# Patient Record
Sex: Male | Born: 1965
Health system: Southern US, Community
[De-identification: ages and names within clinical notes are randomized; demographics above are authoritative.]

## PROBLEM LIST (undated history)

## (undated) DIAGNOSIS — M109 Gout, unspecified: Secondary | ICD-10-CM

## (undated) DIAGNOSIS — D170 Benign lipomatous neoplasm of skin and subcutaneous tissue of head, face and neck: Secondary | ICD-10-CM

## (undated) DIAGNOSIS — K219 Gastro-esophageal reflux disease without esophagitis: Secondary | ICD-10-CM

## (undated) DIAGNOSIS — I10 Essential (primary) hypertension: Secondary | ICD-10-CM

## (undated) DIAGNOSIS — L309 Dermatitis, unspecified: Secondary | ICD-10-CM

## (undated) DIAGNOSIS — E785 Hyperlipidemia, unspecified: Secondary | ICD-10-CM

## (undated) DIAGNOSIS — E669 Obesity, unspecified: Secondary | ICD-10-CM

## (undated) HISTORY — DX: Obesity, unspecified: E66.9

## (undated) HISTORY — DX: Gastro-esophageal reflux disease without esophagitis: K21.9

## (undated) HISTORY — DX: Benign lipomatous neoplasm of skin and subcutaneous tissue of head, face and neck: D17.0

## (undated) HISTORY — DX: Essential (primary) hypertension: I10

## (undated) HISTORY — DX: Dermatitis, unspecified: L30.9

## (undated) HISTORY — PX: OTHER SURGICAL HISTORY: SHX169

## (undated) HISTORY — DX: Hyperlipidemia, unspecified: E78.5

---

## 1997-08-18 ENCOUNTER — Inpatient Hospital Stay (HOSPITAL_COMMUNITY): Admission: RE | Admit: 1997-08-18 | Discharge: 1997-08-20 | Payer: Self-pay | Admitting: Orthopedic Surgery

## 1999-06-20 ENCOUNTER — Emergency Department (HOSPITAL_COMMUNITY): Admission: EM | Admit: 1999-06-20 | Discharge: 1999-06-20 | Payer: Self-pay | Admitting: Emergency Medicine

## 2000-08-26 ENCOUNTER — Emergency Department (HOSPITAL_COMMUNITY): Admission: EM | Admit: 2000-08-26 | Discharge: 2000-08-26 | Payer: Self-pay | Admitting: Emergency Medicine

## 2001-09-09 ENCOUNTER — Encounter: Admission: RE | Admit: 2001-09-09 | Discharge: 2001-12-08 | Payer: Self-pay | Admitting: Family Medicine

## 2003-10-06 ENCOUNTER — Emergency Department (HOSPITAL_COMMUNITY): Admission: EM | Admit: 2003-10-06 | Discharge: 2003-10-06 | Payer: Self-pay | Admitting: Family Medicine

## 2003-12-27 ENCOUNTER — Ambulatory Visit: Payer: Self-pay | Admitting: Family Medicine

## 2003-12-28 ENCOUNTER — Ambulatory Visit: Payer: Self-pay | Admitting: *Deleted

## 2004-01-11 ENCOUNTER — Ambulatory Visit: Payer: Self-pay | Admitting: Family Medicine

## 2004-02-20 ENCOUNTER — Ambulatory Visit: Payer: Self-pay | Admitting: Family Medicine

## 2004-02-22 ENCOUNTER — Ambulatory Visit: Payer: Self-pay | Admitting: Family Medicine

## 2004-03-26 ENCOUNTER — Ambulatory Visit: Payer: Self-pay | Admitting: Family Medicine

## 2004-05-15 ENCOUNTER — Ambulatory Visit: Payer: Self-pay | Admitting: Family Medicine

## 2004-05-18 ENCOUNTER — Ambulatory Visit: Payer: Self-pay | Admitting: Family Medicine

## 2004-07-26 ENCOUNTER — Ambulatory Visit: Payer: Self-pay | Admitting: Family Medicine

## 2004-10-22 ENCOUNTER — Ambulatory Visit: Payer: Self-pay | Admitting: Internal Medicine

## 2004-11-05 ENCOUNTER — Emergency Department (HOSPITAL_COMMUNITY): Admission: EM | Admit: 2004-11-05 | Discharge: 2004-11-05 | Payer: Self-pay | Admitting: Emergency Medicine

## 2004-11-05 ENCOUNTER — Ambulatory Visit: Payer: Self-pay | Admitting: Internal Medicine

## 2004-11-12 ENCOUNTER — Ambulatory Visit: Payer: Self-pay | Admitting: Internal Medicine

## 2004-11-28 ENCOUNTER — Ambulatory Visit: Payer: Self-pay | Admitting: Internal Medicine

## 2007-08-09 ENCOUNTER — Emergency Department (HOSPITAL_COMMUNITY): Admission: EM | Admit: 2007-08-09 | Discharge: 2007-08-10 | Payer: Self-pay | Admitting: Emergency Medicine

## 2010-08-11 ENCOUNTER — Inpatient Hospital Stay (HOSPITAL_COMMUNITY)
Admission: EM | Admit: 2010-08-11 | Discharge: 2010-08-21 | DRG: 729 | Disposition: A | Discharge status: Home Health | Payer: Self-pay | Attending: Urology | Admitting: Urology

## 2010-08-11 ENCOUNTER — Emergency Department (HOSPITAL_COMMUNITY): Payer: Self-pay

## 2010-08-11 DIAGNOSIS — E785 Hyperlipidemia, unspecified: Secondary | ICD-10-CM | POA: Diagnosis present

## 2010-08-11 DIAGNOSIS — R509 Fever, unspecified: Secondary | ICD-10-CM | POA: Diagnosis present

## 2010-08-11 DIAGNOSIS — E871 Hypo-osmolality and hyponatremia: Secondary | ICD-10-CM | POA: Diagnosis not present

## 2010-08-11 DIAGNOSIS — N501 Vascular disorders of male genital organs: Principal | ICD-10-CM | POA: Diagnosis present

## 2010-08-11 DIAGNOSIS — I10 Essential (primary) hypertension: Secondary | ICD-10-CM | POA: Diagnosis present

## 2010-08-11 DIAGNOSIS — R3129 Other microscopic hematuria: Secondary | ICD-10-CM | POA: Diagnosis not present

## 2010-08-11 DIAGNOSIS — R300 Dysuria: Secondary | ICD-10-CM | POA: Diagnosis not present

## 2010-08-11 DIAGNOSIS — K5900 Constipation, unspecified: Secondary | ICD-10-CM | POA: Diagnosis not present

## 2010-08-11 DIAGNOSIS — N179 Acute kidney failure, unspecified: Secondary | ICD-10-CM | POA: Diagnosis present

## 2010-08-11 DIAGNOSIS — Z794 Long term (current) use of insulin: Secondary | ICD-10-CM

## 2010-08-11 DIAGNOSIS — IMO0001 Reserved for inherently not codable concepts without codable children: Secondary | ICD-10-CM | POA: Diagnosis present

## 2010-08-11 DIAGNOSIS — E876 Hypokalemia: Secondary | ICD-10-CM | POA: Diagnosis present

## 2010-08-11 LAB — CBC
HCT: 43.4 % (ref 39.0–52.0)
Hemoglobin: 15.9 g/dL (ref 13.0–17.0)
MCH: 30.3 pg (ref 26.0–34.0)
MCHC: 36.6 g/dL — ABNORMAL HIGH (ref 30.0–36.0)
MCV: 82.8 fL (ref 78.0–100.0)
Platelets: 179 K/uL (ref 150–400)
RBC: 5.24 MIL/uL (ref 4.22–5.81)
RDW: 12.4 % (ref 11.5–15.5)
WBC: 16.1 10*3/uL — ABNORMAL HIGH (ref 4.0–10.5)

## 2010-08-11 LAB — DIFFERENTIAL
Basophils Absolute: 0 10*3/uL (ref 0.0–0.1)
Basophils Relative: 0 % (ref 0–1)
Eosinophils Absolute: 0 K/uL (ref 0.0–0.7)
Eosinophils Relative: 0 % (ref 0–5)
Lymphocytes Relative: 8 % — ABNORMAL LOW (ref 12–46)
Lymphs Abs: 1.3 10*3/uL (ref 0.7–4.0)
Monocytes Absolute: 1.6 10*3/uL — ABNORMAL HIGH (ref 0.1–1.0)
Monocytes Relative: 10 % (ref 3–12)
Neutro Abs: 13.2 K/uL — ABNORMAL HIGH (ref 1.7–7.7)
Neutrophils Relative %: 82 % — ABNORMAL HIGH (ref 43–77)

## 2010-08-11 LAB — BASIC METABOLIC PANEL WITH GFR
CO2: 25 meq/L (ref 19–32)
Calcium: 8.8 mg/dL (ref 8.4–10.5)
Creatinine, Ser: 2.02 mg/dL — ABNORMAL HIGH (ref 0.4–1.5)
GFR calc Af Amer: 43 mL/min — ABNORMAL LOW (ref >60)

## 2010-08-11 LAB — PROCALCITONIN: Procalcitonin: 1.22 ng/mL

## 2010-08-11 LAB — GLUCOSE, CAPILLARY: Glucose-Capillary: 547 mg/dL — ABNORMAL HIGH (ref 70–99)

## 2010-08-11 LAB — BASIC METABOLIC PANEL
BUN: 16 mg/dL (ref 6–23)
Chloride: 87 mEq/L — ABNORMAL LOW (ref 96–112)
GFR calc non Af Amer: 36 mL/min — ABNORMAL LOW (ref >60)
Glucose, Bld: 520 mg/dL — ABNORMAL HIGH (ref 70–99)
Potassium: 3.3 mEq/L — ABNORMAL LOW (ref 3.5–5.1)
Sodium: 130 mEq/L — ABNORMAL LOW (ref 135–145)

## 2010-08-11 LAB — LACTIC ACID, PLASMA: Lactic Acid, Venous: 3.6 mmol/L — ABNORMAL HIGH (ref 0.5–2.2)

## 2010-08-12 LAB — GLUCOSE, CAPILLARY
Glucose-Capillary: 276 mg/dL — ABNORMAL HIGH (ref 70–99)
Glucose-Capillary: 278 mg/dL — ABNORMAL HIGH (ref 70–99)
Glucose-Capillary: 279 mg/dL — ABNORMAL HIGH (ref 70–99)
Glucose-Capillary: 401 mg/dL — ABNORMAL HIGH (ref 70–99)

## 2010-08-12 LAB — HEMOGLOBIN A1C
Hgb A1c MFr Bld: 12.6 % — ABNORMAL HIGH (ref <5.7)
Mean Plasma Glucose: 315 mg/dL — ABNORMAL HIGH (ref <117)

## 2010-08-12 LAB — BASIC METABOLIC PANEL
CO2: 27 mEq/L (ref 19–32)
Calcium: 7.8 mg/dL — ABNORMAL LOW (ref 8.4–10.5)
Creatinine, Ser: 1.4 mg/dL (ref 0.4–1.5)
GFR calc Af Amer: >60 mL/min (ref >60)
Glucose, Bld: 343 mg/dL — ABNORMAL HIGH (ref 70–99)
Sodium: 130 mEq/L — ABNORMAL LOW (ref 135–145)

## 2010-08-12 LAB — LIPID PANEL
HDL: 13 mg/dL — ABNORMAL LOW (ref >39)
LDL Cholesterol: 95 mg/dL (ref 0–99)
VLDL: 22 mg/dL (ref 0–40)

## 2010-08-12 LAB — CBC
HCT: 38.3 % — ABNORMAL LOW (ref 39.0–52.0)
MCH: 30 pg (ref 26.0–34.0)
Platelets: 155 10*3/uL (ref 150–400)
RBC: 4.6 MIL/uL (ref 4.22–5.81)
WBC: 14.5 10*3/uL — ABNORMAL HIGH (ref 4.0–10.5)

## 2010-08-12 LAB — SODIUM, URINE, RANDOM: Sodium, Ur: 56 mEq/L

## 2010-08-12 LAB — CREATININE, URINE, RANDOM: Creatinine, Urine: 201.3 mg/dL

## 2010-08-13 LAB — CBC
MCH: 29.1 pg (ref 26.0–34.0)
MCHC: 35 g/dL (ref 30.0–36.0)
MCV: 83.3 fL (ref 78.0–100.0)
Platelets: 161 10*3/uL (ref 150–400)
RBC: 4.12 MIL/uL — ABNORMAL LOW (ref 4.22–5.81)
RDW: 12.4 % (ref 11.5–15.5)

## 2010-08-13 LAB — GLUCOSE, CAPILLARY
Glucose-Capillary: 234 mg/dL — ABNORMAL HIGH (ref 70–99)
Glucose-Capillary: 185 mg/dL — ABNORMAL HIGH (ref 70–99)

## 2010-08-13 LAB — DIFFERENTIAL
Basophils Relative: 0 % (ref 0–1)
Eosinophils Absolute: 0 10*3/uL (ref 0.0–0.7)
Eosinophils Relative: 0 % (ref 0–5)
Lymphs Abs: 1.8 10*3/uL (ref 0.7–4.0)
Monocytes Absolute: 1.2 10*3/uL — ABNORMAL HIGH (ref 0.1–1.0)
Monocytes Relative: 12 % (ref 3–12)
Neutrophils Relative %: 70 % (ref 43–77)

## 2010-08-13 LAB — BASIC METABOLIC PANEL
BUN: 13 mg/dL (ref 6–23)
Chloride: 98 mEq/L (ref 96–112)
Creatinine, Ser: 1.07 mg/dL (ref 0.4–1.5)
Glucose, Bld: 236 mg/dL — ABNORMAL HIGH (ref 70–99)

## 2010-08-13 LAB — GENTAMICIN LEVEL, RANDOM: Gentamicin Rm: 4.4 ug/mL

## 2010-08-14 LAB — GLUCOSE, CAPILLARY
Glucose-Capillary: 163 mg/dL — ABNORMAL HIGH (ref 70–99)
Glucose-Capillary: 240 mg/dL — ABNORMAL HIGH (ref 70–99)

## 2010-08-14 LAB — CULTURE, ROUTINE-ABSCESS

## 2010-08-14 LAB — BASIC METABOLIC PANEL
BUN: 9 mg/dL (ref 6–23)
CO2: 27 mEq/L (ref 19–32)
Calcium: 7.8 mg/dL — ABNORMAL LOW (ref 8.4–10.5)
Chloride: 98 mEq/L (ref 96–112)
Creatinine, Ser: 1 mg/dL (ref 0.4–1.5)
GFR calc Af Amer: >60 mL/min (ref >60)
GFR calc non Af Amer: >60 mL/min (ref >60)
Glucose, Bld: 185 mg/dL — ABNORMAL HIGH (ref 70–99)
Potassium: 3 mEq/L — ABNORMAL LOW (ref 3.5–5.1)
Sodium: 135 mEq/L (ref 135–145)

## 2010-08-14 LAB — MRSA PCR SCREENING: MRSA by PCR: NEGATIVE

## 2010-08-14 LAB — MAGNESIUM: Magnesium: 1.4 mg/dL — ABNORMAL LOW (ref 1.5–2.5)

## 2010-08-15 LAB — DIFFERENTIAL
Basophils Absolute: 0.1 10*3/uL (ref 0.0–0.1)
Eosinophils Relative: 1 % (ref 0–5)
Lymphocytes Relative: 27 % (ref 12–46)
Lymphs Abs: 2.5 10*3/uL (ref 0.7–4.0)
Monocytes Relative: 12 % (ref 3–12)
Neutro Abs: 5.3 10*3/uL (ref 1.7–7.7)
Smear Review: ADEQUATE

## 2010-08-15 LAB — CBC
HCT: 34.9 % — ABNORMAL LOW (ref 39.0–52.0)
MCHC: 35.2 g/dL (ref 30.0–36.0)
RDW: 12.5 % (ref 11.5–15.5)
WBC: 9.1 10*3/uL (ref 4.0–10.5)

## 2010-08-15 LAB — URIC ACID: Uric Acid, Serum: 4.3 mg/dL (ref 4.0–7.8)

## 2010-08-15 LAB — GLUCOSE, CAPILLARY
Glucose-Capillary: 229 mg/dL — ABNORMAL HIGH (ref 70–99)
Glucose-Capillary: 196 mg/dL — ABNORMAL HIGH (ref 70–99)

## 2010-08-15 LAB — BASIC METABOLIC PANEL
Chloride: 96 mEq/L (ref 96–112)
GFR calc non Af Amer: >60 mL/min (ref >60)
Potassium: 3.2 mEq/L — ABNORMAL LOW (ref 3.5–5.1)
Sodium: 137 mEq/L (ref 135–145)

## 2010-08-15 LAB — MAGNESIUM: Magnesium: 1.9 mg/dL (ref 1.5–2.5)

## 2010-08-16 LAB — GLUCOSE, CAPILLARY
Glucose-Capillary: 247 mg/dL — ABNORMAL HIGH (ref 70–99)
Glucose-Capillary: 249 mg/dL — ABNORMAL HIGH (ref 70–99)

## 2010-08-16 LAB — BASIC METABOLIC PANEL
CO2: 30 mEq/L (ref 19–32)
Calcium: 8.7 mg/dL (ref 8.4–10.5)
Creatinine, Ser: 1 mg/dL (ref 0.4–1.5)
GFR calc Af Amer: >60 mL/min (ref >60)
GFR calc non Af Amer: >60 mL/min (ref >60)
Glucose, Bld: 208 mg/dL — ABNORMAL HIGH (ref 70–99)
Sodium: 133 mEq/L — ABNORMAL LOW (ref 135–145)

## 2010-08-16 LAB — ANAEROBIC CULTURE

## 2010-08-17 LAB — CBC
Hemoglobin: 12.9 g/dL — ABNORMAL LOW (ref 13.0–17.0)
MCH: 29 pg (ref 26.0–34.0)
MCHC: 35 g/dL (ref 30.0–36.0)
MCV: 82.9 fL (ref 78.0–100.0)
RBC: 4.53 MIL/uL (ref 4.22–5.81)
RDW: 12.6 % (ref 11.5–15.5)
WBC: 9 10*3/uL (ref 4.0–10.5)

## 2010-08-17 LAB — GLUCOSE, CAPILLARY
Glucose-Capillary: 174 mg/dL — ABNORMAL HIGH (ref 70–99)
Glucose-Capillary: 212 mg/dL — ABNORMAL HIGH (ref 70–99)
Glucose-Capillary: 233 mg/dL — ABNORMAL HIGH (ref 70–99)

## 2010-08-17 LAB — CULTURE, BLOOD (ROUTINE X 2)
Culture  Setup Time: 201204281750
Culture  Setup Time: 201204281750
Culture: NO GROWTH
Culture: NO GROWTH

## 2010-08-17 LAB — DIFFERENTIAL
Basophils Absolute: 0.1 10*3/uL (ref 0.0–0.1)
Basophils Relative: 1 % (ref 0–1)
Eosinophils Absolute: 0.1 10*3/uL (ref 0.0–0.7)
Lymphocytes Relative: 31 % (ref 12–46)
Monocytes Absolute: 0.9 10*3/uL (ref 0.1–1.0)
Neutrophils Relative %: 57 % (ref 43–77)
Smear Review: ADEQUATE

## 2010-08-17 LAB — BASIC METABOLIC PANEL
BUN: 13 mg/dL (ref 6–23)
Chloride: 94 mEq/L — ABNORMAL LOW (ref 96–112)
GFR calc Af Amer: >60 mL/min (ref >60)
GFR calc non Af Amer: >60 mL/min (ref >60)
Potassium: 3.6 mEq/L (ref 3.5–5.1)
Sodium: 135 mEq/L (ref 135–145)

## 2010-08-17 LAB — MAGNESIUM: Magnesium: 1.7 mg/dL (ref 1.5–2.5)

## 2010-08-17 NOTE — Op Note (Signed)
  [name], Justin Houston              ACCOUNT NO.:  [id number]  MEDICAL RECORD NO.:  [id number]           PATIENT TYPE:  I  LOCATION:  1533                         FACILITY:  [facility]  PHYSICIAN:  Danae Chen, M.D.  DATE OF BIRTH:  02-Sep-1965  DATE OF PROCEDURE: DATE OF DISCHARGE:                              OPERATIVE REPORT   PREOPERATIVE DIAGNOSIS:  Fournier's gangrene.  POSTOP DIAGNOSIS:  Fournier's gangrene.  PROCEDURE:  Incision and drainage and wide debridement of Fournier's gangrene.  SURGEON:  Danae Chen, M.D.  ANESTHESIA:  General.  DATE OF PROCEDURE:  August 11, 2010.  INDICATION:  The patient is a 45 year old male who started having chills, fever, swelling of the scrotum and pain since Wednesday. The symptoms have progressively worsened and he went to the urgent care this morning and was referred to [facility] for further evaluation and treatment.  On examination, he was found to have a markedly swollen and reddened andtender left scrotum.  CT scan showed gas formation in the left scrotum with advanced inflammatory changes. The patient was found to have Fournier's gangrene and he is scheduled for incision, drainage, and debridement of Fournier's gangrene.  The patient is a known diabetic since 1997, and he is on Lantus insulin 92 units daily nightly. His hemoglobin is 15.9, hematocrit 43.4, and WBC 16.1 with 82% neutrophils.  His blood glucose is 520, BUN 16, creatinine 2.02.  He is tachycardic with a pulse rate of 118.  DESCRIPTION OF PROCEDURE:  The patient was identified by his wristband and proper time-out was taken.    Under general anesthesia, he was prepped and draped and placed in the dorsal lithotomy position.  An incision was made on the left scrotum and a large amount of foul-smelling purulent drainage was drained out of the incision.  The inflammatory changes extended up to the upper scrotum.  The incision was extended to the length of the scrotum  and widened and aerobes and anaerobes culture were obtained.  Then wide debridement of the scrotum was done.  The debridement was done down to pink tissues.  Hemostasis was then secured with electrocautery.  There was another collection of fluid in the anterior scrotum and another incision was made over that area and a moderate amount of fluid was then drained out of that incision.  The testicle was exposed, but the testicle appears normal. Then pulse lavage of the incision was done. Then Kerlix soaked in normal saline was packed in the wounds.  A #16 Foley catheter was inserted in the bladder and drained clear urine.  The patient tolerated the procedure well and left the OR in satisfactory condition to post anesthesia care unit.     Danae Chen, M.D.     MN/MEDQ  D:  08/11/2010  T:  08/11/2010  Job:  [id number]  Electronically Signed by Lindaann Slough M.D. on 08/17/2010 03:26:41 PM

## 2010-08-17 NOTE — Consult Note (Signed)
Justin Houston, Justin Houston              ACCOUNT NO.:  [id number]  MEDICAL RECORD NO.:  [id number]           PATIENT TYPE:  I  LOCATION:  1533                         FACILITY:  [facility]  PHYSICIAN:  Danae Chen, M.D.  DATE OF BIRTH:  [birth year]  DATE OF CONSULTATION: DATE OF DISCHARGE:                                CONSULTATION   REASON FOR CONSULTATION:  Swelling and pain of scrotum.  The patient is a 45 years old male, known diabetic, who presents to the emergency room today with complaints of severe left scrotal pain.  The pain started on Wednesday and was associated with chills, fever.  The swelling gradually worsened as well as the pain.  He went to the Urgent Care this morning and he was referred to the emergency room for further evaluation. On physical examination, he   was found to have marked swelling of the scrotum, and the  scrotum is exquisitely tender, more on the left.  It is more prevalent on the left side than the right side.  He does not have any voiding symptoms. A CT scan of the pelvis showed gas formation in the scrotum with marked inflammatory reaction.  PAST MEDICAL HISTORY:  His past medical history is positive for diabetes, hypertension.  PAST SURGICAL HISTORY:  None.  MEDICATIONS: 1. Metformin 1000 mg twice a day. 2. Amlodipine 10 mg a day. 3. Atenolol 50 mg a day. 4. Atenolol/chlorthalidone 50/25 once a day. 5. Glyburide 10 mg twice a day. 6. Omeprazole  20 mg a day. 7. Simvastatin 40 mg a day. 8. Lantus insulin 92 units every evening. 9. Aspirin 325 mg a day.  ALLERGIES:  HYDRALAZINE.  FAMILY HISTORY:  His father died of unknown causes to him.  His mother died of brain aneurysm and had a stroke.  SOCIAL HISTORY:  He is single, does not have any children.  Does not smoke, nor drink.  REVIEW OF SYSTEMS:  Is as noted in the HPI and everything else is negative.  He denies any shortness of breath or chest pain.  PHYSICAL EXAMINATION:  GENERAL:   This is a morbidly obese 45 years old male who is complaining of severe left scrotal pain and swelling.  He is alert and oriented to time, place, and person. VITAL SIGNS:  His blood pressure is 131/92, pulse 128, respirations 20, temperature 99.3. HEENT:  His head is normal.  He has pink conjunctivae.  Ears, nose and throat all within normal limits. NECK:  Supple. He has no cervical adenopathy.  No thyromegaly. CHEST:  Symmetrical. LUNGS:  Fully expanded and clear to percussion and auscultation. HEART:  Regular rhythm. ABDOMEN:  The abdomen is obese, soft, nontender.  He has no CVA tenderness.  Kidneys are not palpable.  He has no hepatomegaly, no splenomegaly.  Bladder is not distended.  He has no inguinal hernia.  No inguinal adenopathy.  Bowel sounds are normal. GENITALIA:  Penis is circumcised.  Meatus is normal.  The scrotum is markedly swollen and reddened.  It is exquisitely tender.  It is more prevalent on the left than the right side.  The testicles are nontender and there  are no testicular mass. EXTREMITIES:  Within normal limits. RECTAL:  Examination is deferred.  IMPRESSION: 1. Fournier's gangrene. 2. Diabetes. 3. Hypertension.  PLAN:  Incision, drainage, and debridement of scrotum on an urgent basis.     Danae Chen, M.D.     MN/MEDQ  D:  08/11/2010  T:  08/11/2010  Job:  [id number]  Electronically Signed by Lindaann Slough M.D. on 08/17/2010 03:24:05 PM

## 2010-08-18 LAB — BASIC METABOLIC PANEL
BUN: 15 mg/dL (ref 6–23)
CO2: 27 mEq/L (ref 19–32)
Chloride: 95 mEq/L — ABNORMAL LOW (ref 96–112)
Creatinine, Ser: 1.06 mg/dL (ref 0.4–1.5)
Potassium: 3.7 mEq/L (ref 3.5–5.1)

## 2010-08-18 LAB — CBC
HCT: 37.1 % — ABNORMAL LOW (ref 39.0–52.0)
MCH: 29.1 pg (ref 26.0–34.0)
MCV: 83 fL (ref 78.0–100.0)
RBC: 4.47 MIL/uL (ref 4.22–5.81)
RDW: 12.6 % (ref 11.5–15.5)
WBC: 8.6 10*3/uL (ref 4.0–10.5)

## 2010-08-18 LAB — GLUCOSE, CAPILLARY: Glucose-Capillary: 201 mg/dL — ABNORMAL HIGH (ref 70–99)

## 2010-08-19 LAB — GLUCOSE, CAPILLARY
Glucose-Capillary: 165 mg/dL — ABNORMAL HIGH (ref 70–99)
Glucose-Capillary: 217 mg/dL — ABNORMAL HIGH (ref 70–99)
Glucose-Capillary: 215 mg/dL — ABNORMAL HIGH (ref 70–99)

## 2010-08-20 LAB — URINALYSIS, ROUTINE W REFLEX MICROSCOPIC
Glucose, UA: NEGATIVE mg/dL
Ketones, ur: NEGATIVE mg/dL
Protein, ur: 30 mg/dL — AB
pH: 6 (ref 5.0–8.0)

## 2010-08-20 LAB — GLUCOSE, CAPILLARY
Glucose-Capillary: 242 mg/dL — ABNORMAL HIGH (ref 70–99)
Glucose-Capillary: 229 mg/dL — ABNORMAL HIGH (ref 70–99)

## 2010-08-20 LAB — URINE MICROSCOPIC-ADD ON

## 2010-08-21 LAB — GLUCOSE, CAPILLARY: Glucose-Capillary: 166 mg/dL — ABNORMAL HIGH (ref 70–99)

## 2010-08-22 LAB — URINE CULTURE
Colony Count: NO GROWTH
Culture  Setup Time: 201205080250

## 2010-08-31 NOTE — Discharge Summary (Signed)
[name], [name]              ACCOUNT NO.:  [id number]  MEDICAL RECORD NO.:  [id number]           PATIENT TYPE:  I  LOCATION:  1436                         FACILITY:  [facility]  PHYSICIAN:  Danae Chen, M.D.  DATE OF BIRTH:  31-Dec-1965  DATE OF ADMISSION:  08/11/2010 DATE OF DISCHARGE:  08/21/2010                              DISCHARGE SUMMARY   DISCHARGE DIAGNOSES: 1. Fournier gangrene. 2. Diabetes. 3. Hypertension. 4. Hyperlipidemia. 5. Morbid obesity.  PROCEDURE DONE:  Incision and drainage and debridement of Fournier gangrene.  The patient is a 45 year old male diabetic who presented to the emergency room on April 28 with complaints of severe left scrotal pain associated with chills and fever.  The swelling gradually increased in size over a period of 3 days before the emergency room visit.  A CT scan showed gas formation in the scrotum.  I was then asked to see the patient and a diagnosis of Fournier gangrene was made.  He was taken to the OR for incision, drainage and debridement of the scrotum.  PHYSICAL EXAMINATION:  GENERAL:  This is a morbidly obese 45 year old male. VITAL SIGNS:  His blood pressure was 131/92, pulse 128, respirations 20, temperature 99.3. LUNGS:  Clear. HEART:  Regular rhythm. ABDOMEN:  Obese, nontender.  No CVA tenderness.  Kidneys not palpable. No hepatomegaly, no splenomegaly.  Bowel sounds were normal.  The scrotum was markedly enlarged, more so on the left side than the right side and exquisitely tender.  The right testis was normal.  I could not palpate on the left testis.  His hemoglobin on April 28 was 15.9, hematocrit 43.4, WBC 16.1.  His BUN was 16, creatinine 2.02, sodium 130, potassium 3.3.  The patient was taken to the OR and had wide incision and drainage and debridement of the scrotum.  He was then treated with gentamicin and Zosyn.  Consultation with [facility] was obtained for management of his diabetes.  He had  potassium replacement and his potassium eventually went up to 3.7 on May 5.  His BUN was 15 and his creatinine went down to 1.06 on May 5.  He was also treated with pulse lavage and the wound gradually improved.  The wound was pink, and he did not have any more necrotic tissues there.  The pulse lavage was discontinued on May 7, and he had the Foley catheter for several days.  After removal of the Foley catheter, he was complaining of dysuria.  His urinalysis showed 30 mg of protein and microhematuria, and he also had few bacteria.  Urine culture was pending.  On May 8, he was afebrile.  The wound was granulating well.  He was then discharged home with home health nurse to do dressing change.  The patient was also instructed how to do the dressing change.  DISCHARGE DIET:  1800-calorie diabetic diet.  CONDITION ON DISCHARGE:  Improved.  The patient is instructed not to do any heavy lifting or straining or driving.  He will be followed in the office as an outpatient.     Danae Chen, M.D.     MN/MEDQ  D:  08/21/2010  T:  08/21/2010  Job:  [id number]  Electronically Signed by Lindaann Slough M.D. on 08/31/2010 11:38:36 AM
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
## 2011-07-20 ENCOUNTER — Ambulatory Visit (INDEPENDENT_AMBULATORY_CARE_PROVIDER_SITE_OTHER): Payer: BC Managed Care – PPO | Admitting: Family Medicine

## 2011-07-20 DIAGNOSIS — M109 Gout, unspecified: Secondary | ICD-10-CM

## 2011-07-20 DIAGNOSIS — E113299 Type 2 diabetes mellitus with mild nonproliferative diabetic retinopathy without macular edema, unspecified eye: Secondary | ICD-10-CM | POA: Insufficient documentation

## 2011-07-20 DIAGNOSIS — E785 Hyperlipidemia, unspecified: Secondary | ICD-10-CM

## 2011-07-20 DIAGNOSIS — E11319 Type 2 diabetes mellitus with unspecified diabetic retinopathy without macular edema: Secondary | ICD-10-CM | POA: Insufficient documentation

## 2011-07-20 DIAGNOSIS — I10 Essential (primary) hypertension: Secondary | ICD-10-CM | POA: Insufficient documentation

## 2011-07-20 DIAGNOSIS — E1169 Type 2 diabetes mellitus with other specified complication: Secondary | ICD-10-CM | POA: Insufficient documentation

## 2011-07-20 MED ORDER — INDOMETHACIN 50 MG PO CAPS: 50.0000 mg | ORAL_CAPSULE | Freq: Three times a day (TID) | ORAL | Status: DC

## 2011-07-20 MED ORDER — COLCHICINE 0.6 MG PO TABS: ORAL_TABLET | ORAL | Status: DC

## 2011-07-20 NOTE — Progress Notes (Signed)
  Patient Name: Justin Houston Date of Birth: [birth year] Medical Record Number: [id number] Gender: male Date of Encounter: 07/20/2011  History of Present Illness:  Justin Houston is a 46 y.o. very pleasant male patient who presents with the following:  Here today with concern of gout in his left ankle- has symptoms for about 3 days.  He has had gout in the past.  Perhaps due to eating pork recently.  No known injury- it just started to hurt.  He could not even put a sock on yesterday but today is a little better. He has not tried any medications yet for this  There is no problem list on file for this patient.  No past medical history on file. No past surgical history on file. History  Substance Use Topics  . Smoking status: Never Smoker   . Smokeless tobacco: Not on file  . Alcohol Use: No   No family history on file. Allergies  Allergen Reactions  . Hyzaar Hives    Medication list has been reviewed and updated.  Review of Systems: As per HPI- otherwise negative. He has poorly formed and flat feet but states that he is normally able to walk well without pain  Physical Examination: Filed Vitals:   07/20/11 1254  BP: 137/85  Pulse: 82  Temp: 98.7 F (37.1 C)  TempSrc: Oral  Resp: 20  Height: 5\' 11"  (1.803 m)  Weight: 377 lb (171.006 kg)    Body mass index is 52.58 kg/(m^2).  GEN: WDWN, NAD, Non-toxic, A & O x 3 HEENT: Atraumatic, Normocephalic. Neck supple. No masses, No LAD. Lipoma right side of scalp above ear Ears and Nose: No external deformity. CV: RRR, No M/G/R. No JVD. No thrill. No extra heart sounds. PULM: CTA B, no wheezes, crackles, rhonchi. No retractions. No resp. distress. No accessory muscle use. EXTR: No c/c/e NEURO currently using a cane PSYCH: Normally interactive. Conversant. Not depressed or anxious appearing.  Calm demeanor.  Left ankle/ foot- swollen and tender on medial and anterior ankle.  Pes planus, with medially displaced metatarsals.   Per patient this is baseline for him- he has a distant history of some sort of foot fracture. Achilles intact, foot is non- tender, no heat or swelling in the foot.    Assessment and Plan: 1. Gout  colchicine 0.6 MG tablet, indomethacin (INDOCIN) 50 MG capsule  2. Hypertension    3. Diabetes mellitus    4. Hyperlipidemia      Treat gout as above- avoid pork as this may be a trigger.  Despite his abnormal foot and ankle conformation Justin Houston normally is able to walk well, and does not desire any xrays at this point.  If he is not better in the next few days please let me know- Sooner if worse.

## 2011-08-14 ENCOUNTER — Telehealth: Payer: Self-pay

## 2011-08-14 NOTE — Telephone Encounter (Signed)
PT STATES THAT [employer] FAXED A FORM OVER TO [place] ON MONDAY REGARDING HIS LANTIS, PT WOULD LIKE TO KNOW THE STATUS OF THIS FORM.

## 2011-08-15 NOTE — Telephone Encounter (Signed)
The form was in my box.  Please call the patient for the following information:  1. Does he have any insurance coverage at this point?  If so, we need the Name, ID and phone number for his insurance.  2. Has there been a change in his Lantus dose?  3. Clarify the current Lantus Dose.

## 2011-08-15 NOTE — Telephone Encounter (Signed)
Pt reports that he does not currently have any insurance. His dose of lantus has not changed. He takes 92 units QD.

## 2011-08-16 NOTE — Telephone Encounter (Signed)
Form completed and given to Launa Flight, RN to fax.

## 2011-08-16 NOTE — Telephone Encounter (Signed)
LMOM that form has been completed and faxed in to [facility] w/confirmation

## 2011-11-08 ENCOUNTER — Ambulatory Visit (INDEPENDENT_AMBULATORY_CARE_PROVIDER_SITE_OTHER): Payer: BC Managed Care – PPO | Admitting: Internal Medicine

## 2011-11-08 VITALS — BP 146/94 | HR 80 | Temp 98.2°F | Resp 16 | Ht 72.5 in | Wt 316.0 lb

## 2011-11-08 DIAGNOSIS — S335XXA Sprain of ligaments of lumbar spine, initial encounter: Secondary | ICD-10-CM

## 2011-11-08 DIAGNOSIS — M5380 Other specified dorsopathies, site unspecified: Secondary | ICD-10-CM

## 2011-11-08 DIAGNOSIS — M62830 Muscle spasm of back: Secondary | ICD-10-CM

## 2011-11-08 DIAGNOSIS — S39012A Strain of muscle, fascia and tendon of lower back, initial encounter: Secondary | ICD-10-CM

## 2011-11-08 MED ORDER — METHOCARBAMOL 750 MG PO TABS: 750.0000 mg | ORAL_TABLET | Freq: Four times a day (QID) | ORAL | Status: AC

## 2011-11-08 NOTE — Progress Notes (Signed)
  Subjective:    Patient ID: Justin Houston, male    DOB: [birth year], 46 y.o.   MRN: [id number]  HPI Was mowing yard, walking and lifting, twisting and turning .Felt pain in R lb Toward hip. Hard to get out of bed this am. Is able to walk, can flex and extend with pain.   Review of Systems See problem list    Objective:   Physical Exam  Constitutional: He is oriented to person, place, and time. No distress.  Eyes: EOM are normal.  Pulmonary/Chest: Effort normal.  Musculoskeletal:       Right hip: He exhibits normal range of motion, normal strength, no tenderness, no bony tenderness, no swelling, no crepitus, no deformity and no laceration.       Lumbar back: He exhibits tenderness, pain and spasm. He exhibits normal range of motion, no bony tenderness, no swelling, no edema and no deformity.  Neurological: He is alert and oriented to person, place, and time. He has normal strength and normal reflexes. No sensory deficit. Coordination and gait normal.   Obese       Assessment & Plan:  LB strain Back manual Robaxin

## 2011-11-10 ENCOUNTER — Other Ambulatory Visit: Payer: Self-pay | Admitting: Family Medicine

## 2011-11-11 ENCOUNTER — Telehealth: Payer: Self-pay

## 2011-11-11 NOTE — Telephone Encounter (Signed)
Pt advised Maralyn Sago has called in the Rx for her.

## 2011-11-11 NOTE — Telephone Encounter (Signed)
PT STATES THAT HE NEEDS HIS GOUT MEDICATION FILLED ASAP BECAUSE HE IS UNABLE TO WALK DUE TO HIS FOOT BEING SWOLLEN, THERE IS A REFILL REQUEST THAT WAS SENT FROM PT'S PHARMACY FOR THE RX YESTERDAY. PT WOULD LIKE A CALL ONCE THE RX HAS BEEN REFILLED. BEST# [phone] PHARMACY: [facility] [address]

## 2011-12-11 ENCOUNTER — Telehealth: Payer: Self-pay

## 2011-12-11 NOTE — Telephone Encounter (Signed)
Pt called to request that his Lantus rx be sent to our office. He says that this is something that Chelle takes care of. He can be reached on his cell at [phone].

## 2011-12-12 MED ORDER — INSULIN GLARGINE 100 UNIT/ML ~~LOC~~ SOLN: 92.0000 [IU] | Freq: Every day | SUBCUTANEOUS | Status: DC

## 2011-12-12 NOTE — Telephone Encounter (Signed)
Pts chart at desk. He was here in November for physical wilth Chelle

## 2011-12-12 NOTE — Telephone Encounter (Signed)
I have left message for patient to advise Rx ready and he is due for follow up

## 2011-12-12 NOTE — Telephone Encounter (Signed)
Sent rx to pharmacy but needs office visit for additional refills

## 2011-12-18 ENCOUNTER — Ambulatory Visit (INDEPENDENT_AMBULATORY_CARE_PROVIDER_SITE_OTHER): Payer: BC Managed Care – PPO | Admitting: Family Medicine

## 2011-12-18 VITALS — BP 142/83 | HR 71 | Temp 98.5°F | Resp 18 | Wt 390.0 lb

## 2011-12-18 DIAGNOSIS — I10 Essential (primary) hypertension: Secondary | ICD-10-CM

## 2011-12-18 DIAGNOSIS — E119 Type 2 diabetes mellitus without complications: Secondary | ICD-10-CM

## 2011-12-18 DIAGNOSIS — E785 Hyperlipidemia, unspecified: Secondary | ICD-10-CM

## 2011-12-18 LAB — POCT GLYCOSYLATED HEMOGLOBIN (HGB A1C): Hemoglobin A1C: 6.2

## 2011-12-18 NOTE — Patient Instructions (Addendum)
Very nice to meet you. We will draw labs on you today. We'll call you with the results. Continue to work on diet and exercise to lose weight. This would be the most beneficial thing you can do for yourself. I want you to divide your Lantus to 46 units twice a day. Please come back in 2-3 months to followup with Chelle to discuss her diabetes further.

## 2011-12-18 NOTE — Assessment & Plan Note (Addendum)
Patient did have an A1c done in November 2012 that was 7.9. This is a significant improvement from 12.9. Patient's A1c today is 6.2!!!! Patient is on glipizide 10 mg 2 times daily, metformin 1000 mg twice daily as well as Lantus 92 units every morning.  We will also check a complete metabolic panel, thyroid panel as well as lipids. These will be followed up in patient will be instructed of any changes necessary. Continue lantus 92 QAM with that great A1c. Patient though will followup if he has any hypoglycemic events so we can titrate as needed. Patient was given flu shot today. Patient was not given his tetanus shot secondary to being on back order. Patient is supposed to get his Lantus delivered here. We will call him once we have it. Patient will followup in 3 months time for further evaluation.

## 2011-12-18 NOTE — Progress Notes (Signed)
  Subjective:    Patient ID: Justin Houston, male    DOB: [birth year], 46 y.o.   MRN: [id number]  HPI  Diabetes:  High at home:180's Low at home:120's Taking medications: lantus 92 QAM, glipizide and metformin Side effects:none ROS: denies fever, chills, dizziness, loss of conscieness, polyuria poly dipsia numbness or tingling in extremities or chest pain.  Hyperlipidemia-patient has not had his cholesterol checked for about a year at this time. Patient is taking Zocor 40 mg daily not having any side effects.  1. Hypertension Blood pressure at home:not checking Blood pressure today: 142/83 Taking Meds:yes Side effects:none ROS: Denies headache visual changes nausea, vomiting, chest pain or abdominal pain or shortness of breath.  Review of Systems As stated above in history of present illness    Objective:   Physical Exam Filed Vitals:   12/18/11 1727  BP: 142/83  Pulse: 71  Temp: 98.5 F (36.9 C)  Resp: 18   Wt Readings from Last 3 Encounters:  12/18/11 390 lb (176.903 kg)  11/08/11 316 lb (143.337 kg)  07/20/11 377 lb (171.006 kg)   GEN: WDWN, NAD, Non-toxic, A & O x 3 HEENT: Atraumatic, Normocephalic. Neck supple. No masses, No LAD. Lipoma right side of scalp above ear, no change in size since last visit per notes.  Ears and Nose: No external deformity. CV: RRR, No M/G/R. No JVD. No thrill. No extra heart sounds. PULM: CTA B, no wheezes, crackles, rhonchi. No retractions. No resp. distress. No accessory muscle use. EXTR: No c/c/e NEURO foot exam shows the patient is neurovascularly intact with  Pysch: Normally interactive. Conversant. Not depressed or anxious appearing.  Calm demeanor.       Assessment & Plan:

## 2011-12-18 NOTE — Assessment & Plan Note (Signed)
Lipids will be checked today. Patient is taking Zocor 40 mg.

## 2011-12-18 NOTE — Assessment & Plan Note (Addendum)
Filed Vitals:   12/18/11 1727  BP: 142/83  Pulse: 71  Temp: 98.5 F (36.9 C)  Resp: 18   patient is near goal at this time. We'll not make any changes to his regimen. Complete metabolic panel be checked today. Patient is not on an ACE inhibitor and would consider this in the near future. We are checking a microalbumurea today. Patient did have any allergy to losartan appears. Also the starting ACE inhibitor would have to monitor his gout closely.

## 2011-12-19 LAB — TSH: TSH: 2.249 u[IU]/mL (ref 0.350–4.500)

## 2011-12-19 LAB — LIPID PANEL
HDL: 38 mg/dL — ABNORMAL LOW (ref >39)
LDL Cholesterol: 135 mg/dL — ABNORMAL HIGH (ref 0–99)
Total CHOL/HDL Ratio: 5.3 Ratio
VLDL: 30 mg/dL (ref 0–40)

## 2011-12-19 LAB — COMPREHENSIVE METABOLIC PANEL
ALT: 14 U/L (ref 0–53)
AST: 26 U/L (ref 0–37)
Alkaline Phosphatase: 79 U/L (ref 39–117)
CO2: 26 mEq/L (ref 19–32)
Creat: 1.06 mg/dL (ref 0.50–1.35)
Sodium: 139 mEq/L (ref 135–145)
Total Bilirubin: 0.9 mg/dL (ref 0.3–1.2)
Total Protein: 7.7 g/dL (ref 6.0–8.3)

## 2011-12-20 LAB — MICROALBUMIN, URINE: Microalb, Ur: 5.13 mg/dL — ABNORMAL HIGH (ref 0.00–1.89)

## 2011-12-21 ENCOUNTER — Telehealth: Payer: Self-pay

## 2011-12-21 NOTE — Telephone Encounter (Signed)
Faxed form to [employer] for lantus

## 2011-12-21 NOTE — Telephone Encounter (Signed)
Please contact pt he states that Chelle had put rx in and his insulin was to be picked up at our office [phone]

## 2011-12-24 ENCOUNTER — Telehealth: Payer: Self-pay

## 2011-12-24 NOTE — Telephone Encounter (Signed)
Patient called stating Lantus will be coming to our office for him and he would like a phone call at [phone] when it arrives.

## 2011-12-24 NOTE — Telephone Encounter (Signed)
Shekitia, do you know if his lantus has come in? I have looked and we have 4 vials of this medication and I do not know if they may be his. Please advise. I do not know how many they normally send him either, do you know?

## 2011-12-24 NOTE — Telephone Encounter (Signed)
I have been looking for this it is not in yet. Will call him when it arrives.

## 2011-12-25 NOTE — Telephone Encounter (Signed)
Call [employer].

## 2011-12-26 ENCOUNTER — Telehealth: Payer: Self-pay

## 2011-12-26 NOTE — Telephone Encounter (Signed)
Blood work results ; refills insulin   Chelle please call [phone]

## 2011-12-26 NOTE — Telephone Encounter (Signed)
lmom to cb.  [facility] has received our request.  It will be about 3-5 days before it will be sent out.  We have a couple of vials that pt can have in the back fridge if he would like.

## 2011-12-26 NOTE — Telephone Encounter (Signed)
1-due to the small amount of protein in the urine being a sign of damage from diabetes and blood pressure medication was mentioned. Pt wanted to know your take on the blood pressure medication since he is already on BP meds and why there would be protein since he is already on BP med. One of the reasons we see protein in the urine is uncontrolled blood pressure.  Since the protein indicates that the kidneys are not functioning as well as they should (and because this is an early sign of kidney problems) we want to be aggressive with getting BP controlled.  The best medications for this are the ones he's allergic to, so we have to choose alternatives.  Also, elevated blood sugar can cause this, so it's also important that he keep his blood sugar controlled.   2-In regards to Cholesterol, he stated he previously took Actos, but due to insurance yall discussed and switched to Simvastatin. He wants to know your option on Cholesterol med. Actos is a medicine that lowers blood sugar, which when elevated can cause cholesterol to rise, too.  The simvastatin is for cholesterol lowering and is in the family recommended for patients who have diabetes.  However, it's not as potent as some of the others, like Lipitor.  Since his cholesterol is higher than it should be, I agree with the change to lipitor.

## 2011-12-26 NOTE — Telephone Encounter (Signed)
Spoke with pt and would like Chelle to call him and talk about the questions he had talked with French Ana about. (See notes on lab) Please call pt at [phone].

## 2011-12-26 NOTE — Telephone Encounter (Signed)
Justin Houston called them and it has shipped.

## 2011-12-28 ENCOUNTER — Telehealth: Payer: Self-pay

## 2011-12-28 NOTE — Telephone Encounter (Signed)
PATIENT WOULD LIKE TO TALK WITH CHELLE REGARDING HIS RECENTS LABS   ALSO, PLEASE CALL WHEN HIS MEDS ARRIVE AT THE OFFICE   [phone] (H)

## 2011-12-29 MED ORDER — ATORVASTATIN CALCIUM 20 MG PO TABS: 20.0000 mg | ORAL_TABLET | Freq: Every day | ORAL | Status: DC

## 2011-12-29 NOTE — Telephone Encounter (Signed)
Spoke with pt advised message from [place], see previous message. Pt understood. Justin Houston is ok with giving him a sample of lantus solostar pens until his shipment comes in. Called pt to advise and LMOM to CB

## 2011-12-29 NOTE — Telephone Encounter (Signed)
LMOM to CB. 

## 2011-12-29 NOTE — Telephone Encounter (Signed)
Spoke with pt advised Lantus in the fridge (samples) for him to come pick up so he can have his medicine while waiting for the shipment to come in. Pt understood

## 2012-01-02 ENCOUNTER — Telehealth: Payer: Self-pay

## 2012-01-02 NOTE — Telephone Encounter (Signed)
Justin Houston - WANTS TO KNOW IF HIS INSULIN HAS CAME IN [place] - [phone]

## 2012-01-02 NOTE — Telephone Encounter (Signed)
His insulin came in today. Called pt to let him know, LMOM to CB

## 2012-01-06 NOTE — Telephone Encounter (Signed)
Pt came to pick up insulin  °

## 2012-01-13 ENCOUNTER — Telehealth: Payer: Self-pay

## 2012-01-13 NOTE — Telephone Encounter (Signed)
Pt came into office today and left two coupons regarding his diabetic meter and testing strips (which is in [employer] )    He needs to have a written rx for them and then return the coupons with the RX  Best number [phone]

## 2012-01-14 NOTE — Telephone Encounter (Signed)
Yes, please provide rx for FreeStyle Insulinx meter with test strips.  Sig: check sugar tid  Disp: QS one month  RF: PRN one year.  KMS

## 2012-01-14 NOTE — Telephone Encounter (Addendum)
Dr Katrinka Blazing, may we Rx a FreeStyle InsuLinx meter and testing strips for pt? Do you want him testing TID since he is insulin dependent? RFs for 1 year?

## 2012-01-15 NOTE — Telephone Encounter (Signed)
Called in Rxs for meter and strips to [facility] [place] [facility] for pt this has been done and his coupons are ready for him to p/up.

## 2012-01-15 NOTE — Progress Notes (Signed)
Below history and physical exam reviewed.  Agree with outlined assessment and plan.  KMS

## 2012-01-17 NOTE — Progress Notes (Signed)
Reviewed and agree.

## 2012-03-16 ENCOUNTER — Telehealth: Payer: Self-pay

## 2012-03-16 NOTE — Telephone Encounter (Signed)
PT STATES HE GETS IN INSULIN FROM A COMPANY OUT OF STATE AND HAVE TO CALL SO WE CAN ORDER IT FOR HIM. PLEASE CALL [phone]

## 2012-03-16 NOTE — Telephone Encounter (Signed)
[employer] form will be filled out and faxed

## 2012-03-16 NOTE — Telephone Encounter (Signed)
lmom that form has been faxed and to allow about 7 days for them to process.

## 2012-03-20 ENCOUNTER — Ambulatory Visit (INDEPENDENT_AMBULATORY_CARE_PROVIDER_SITE_OTHER): Payer: BC Managed Care – PPO | Admitting: Internal Medicine

## 2012-03-20 VITALS — BP 149/89 | HR 72 | Temp 97.9°F | Resp 20 | Ht 72.0 in | Wt 389.0 lb

## 2012-03-20 DIAGNOSIS — H1089 Other conjunctivitis: Secondary | ICD-10-CM

## 2012-03-20 DIAGNOSIS — H109 Unspecified conjunctivitis: Secondary | ICD-10-CM

## 2012-03-20 DIAGNOSIS — E119 Type 2 diabetes mellitus without complications: Secondary | ICD-10-CM

## 2012-03-20 DIAGNOSIS — H00015 Hordeolum externum left lower eyelid: Secondary | ICD-10-CM

## 2012-03-20 DIAGNOSIS — H00019 Hordeolum externum unspecified eye, unspecified eyelid: Secondary | ICD-10-CM

## 2012-03-20 LAB — POCT GLYCOSYLATED HEMOGLOBIN (HGB A1C): Hemoglobin A1C: 5.9

## 2012-03-20 MED ORDER — AMOXICILLIN 500 MG PO CAPS: 1000.0000 mg | ORAL_CAPSULE | Freq: Two times a day (BID) | ORAL | Status: DC

## 2012-03-20 MED ORDER — OFLOXACIN 0.3 % OP SOLN: 1.0000 [drp] | OPHTHALMIC | Status: DC

## 2012-03-20 MED ORDER — GLIPIZIDE 10 MG PO TABS: 10.0000 mg | ORAL_TABLET | Freq: Two times a day (BID) | ORAL | Status: DC

## 2012-03-20 MED ORDER — METFORMIN HCL 1000 MG PO TABS: 1000.0000 mg | ORAL_TABLET | Freq: Two times a day (BID) | ORAL | Status: DC

## 2012-03-20 NOTE — Patient Instructions (Addendum)
Sty  A sty (hordeolum) is an infection of a gland in the eyelid located at the base of the eyelash. A sty may develop a white or yellow head of pus. It can be puffy (swollen). Usually, the sty will burst and pus will come out on its own. They do not leave lumps in the eyelid once they drain.  A sty is often confused with another form of cyst of the eyelid called a chalazion. Chalazions occur within the eyelid and not on the edge where the bases of the eyelashes are. They often are red, sore and then form firm lumps in the eyelid.  CAUSES    Germs (bacteria).   Lasting (chronic) eyelid inflammation.  SYMPTOMS    Tenderness, redness and swelling along the edge of the eyelid at the base of the eyelashes.   Sometimes, there is a white or yellow head of pus. It may or may not drain.  DIAGNOSIS   An ophthalmologist will be able to distinguish between a sty and a chalazion and treat the condition appropriately.   TREATMENT    Styes are typically treated with warm packs (compresses) until drainage occurs.   In rare cases, medicines that kill germs (antibiotics) may be prescribed. These antibiotics may be in the form of drops, cream or pills.   If a hard lump has formed, it is generally necessary to do a small incision and remove the hardened contents of the cyst in a minor surgical procedure done in the office.   In suspicious cases, your caregiver may send the contents of the cyst to the lab to be certain that it is not a rare, but dangerous form of cancer of the glands of the eyelid.  HOME CARE INSTRUCTIONS    Wash your hands often and dry them with a clean towel. Avoid touching your eyelid. This may spread the infection to other parts of the eye.   Apply heat to your eyelid for 10 to 20 minutes, several times a day, to ease pain and help to heal it faster.   Do not squeeze the sty. Allow it to drain on its own. Wash your eyelid carefully 3 to 4 times per day to remove any pus.  SEEK IMMEDIATE MEDICAL CARE IF:     Your eye becomes painful or puffy (swollen).   Your vision changes.   Your sty does not drain by itself within 3 days.   Your sty comes back within a short period of time, even with treatment.   You have redness (inflammation) around the eye.   You have a fever.  Document Released: 01/09/2005 Document Revised: 06/24/2011 Document Reviewed: 09/13/2008  Exit[employer] Patient Information 2013 [employer].

## 2012-03-20 NOTE — Progress Notes (Signed)
  Subjective:    Patient ID: Justin Houston, male    DOB: [birth year], 46 y.o.   MRN: [id number]  HPI Morbidly obese IDDM man with left eye infection,purulent discharge present. 4-5days of swelling left lower lid. Needs glipizide and met formin refilled, agrees to see Ms. Leotis Shames at 104 in 2 mos.   Review of Systems     Objective:   Physical Exam  Constitutional: He is oriented to person, place, and time. He appears well-nourished. No distress.  Eyes: EOM are normal. Pupils are equal, round, and reactive to light. Right eye exhibits no discharge. Left eye exhibits discharge and hordeolum. Left conjunctiva is injected.    Pulmonary/Chest: Effort normal.  Neurological: He is alert and oriented to person, place, and time.  Psychiatric: He has a normal mood and affect.   Left eye lid lower with draining abscess--c/s done  Results for orders placed in visit on 03/20/12  GLUCOSE, POCT (MANUAL RESULT ENTRY)      Component Value Range   POC Glucose 116 (*) 70 - 99 mg/dl  POCT GLYCOSYLATED HEMOGLOBIN (HGB A1C)      Component Value Range   Hemoglobin A1C 5.9          Assessment & Plan:  Ocuflox/amoxil 1g bid Warm compresses q 2hr. RTC 2d prn not better Appt f/up diabetes

## 2012-03-24 LAB — WOUND CULTURE: Gram Stain: NONE SEEN

## 2012-04-02 ENCOUNTER — Telehealth: Payer: Self-pay

## 2012-04-02 NOTE — Telephone Encounter (Signed)
PT STATES HE GETS HIS LANTUS THROUGH THE MAIL AND IT IS MAILED TO [place]. WOULD LIKE TO KNOW IF THEY HAVE COME IN YET. PLEASE CALL [phone]

## 2012-04-04 NOTE — Telephone Encounter (Signed)
Notified that insulin is ready for pickup

## 2012-04-21 ENCOUNTER — Telehealth: Payer: Self-pay

## 2012-04-21 ENCOUNTER — Ambulatory Visit (INDEPENDENT_AMBULATORY_CARE_PROVIDER_SITE_OTHER): Payer: BC Managed Care – PPO | Admitting: Family Medicine

## 2012-04-21 VITALS — BP 178/100 | HR 83 | Temp 98.3°F | Resp 18 | Ht 73.0 in | Wt 391.0 lb

## 2012-04-21 DIAGNOSIS — R21 Rash and other nonspecific skin eruption: Secondary | ICD-10-CM

## 2012-04-21 DIAGNOSIS — L309 Dermatitis, unspecified: Secondary | ICD-10-CM

## 2012-04-21 DIAGNOSIS — L259 Unspecified contact dermatitis, unspecified cause: Secondary | ICD-10-CM

## 2012-04-21 DIAGNOSIS — Z111 Encounter for screening for respiratory tuberculosis: Secondary | ICD-10-CM

## 2012-04-21 MED ORDER — CLOBETASOL PROPIONATE 0.05 % EX OINT: TOPICAL_OINTMENT | Freq: Three times a day (TID) | CUTANEOUS | Status: DC

## 2012-04-21 NOTE — Telephone Encounter (Signed)
Spoke with patient. Will copy TB test from November 2012 for pick up. Patient will come by and pick it up.

## 2012-04-21 NOTE — Telephone Encounter (Signed)
NEEDS COPY OF TB TEST. PLEASE CALL WHEN READY

## 2012-04-21 NOTE — Progress Notes (Signed)
Tuberculosis Risk Questionnaire  1. Were you born outside the [place] in one of the following parts of the world:    [place], [place], [place], [place] or [place]?  No  2. Have you traveled outside the [place] and lived for more than one month in one of the following parts of the world:  [place], [place], [place], [place] or [place]?  NO  3. Do you have a compromised immune system such as from any of the following conditions:  HIV/AIDS, organ or bone marrow transplantation, diabetes, immunosuppressive   medicines (e.g. Prednisone, Remicaide), leukemia, lymphoma, cancer of the   head or neck, gastrectomy or jejunal bypass, end-stage renal disease (on   dialysis), or silicosis?  Yes -DIABETES    4. Have you ever done one of the following:    Used crack cocaine, injected illegal drugs, worked or resided in jail or prison,   worked or resided at a homeless shelter, or worked as a Research scientist (physical sciences) in   direct contact with patients?  No  5. Have you ever been exposed to anyone with infectious tuberculosis?  No   Tuberculosis Symptom Questionnaire  Do you currently have any of the following symptoms?  1. Unexplained cough lasting more than 3 weeks? ; NO  Unexplained fever lasting more than 3 weeks. No   3. Night Sweats (sweating that leaves the bedclothes and sheets wet)   No  4. Shortness of Breath No  5. Chest Pain No  6. Unintentional weight loss  No  7. Unexplained fatigue (very tired for no reason) No    [facility]:  Office Visit  Chief Complaint:  Chief Complaint  Patient presents with  . Rash    on hands  . TB Test    HPI: Justin Houston is a 47 y.o. male who complains of  3-4 week history of rash on hands bilaterally. No new products. No new allergens. Patient denies fevers,or chills. + itching. Tried medicine from dermatology, otc lotion without releif. 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 H does not know if they have changed supplier. He washes hands with warm-hot water. Deneis asthma/allergies.   Past Medical History  Diagnosis Date  . Obesity   . Lipoma of scalp   . Diabetes mellitus   . Hypertension   . Hyperlipidemia    Past Surgical History  Procedure Date  . Right arm orif    History   Social History  . Marital Status: Single    Spouse Name: N/A    Number of Children: N/A  . Years of Education: N/A   Social History Main Topics  . Smoking status: Never Smoker   . Smokeless tobacco: None  . Alcohol Use: No  . Drug Use: None  . Sexually Active: None   Other Topics Concern  . None   Social History Narrative  . None   No family history on file. Allergies  Allergen Reactions  . Losartan Potassium-Hctz Hives   Prior to Admission medications   Medication Sig Start Date End Date Taking? Authorizing Provider  amLODipine (NORVASC) 10 MG tablet Take 10 mg by mouth daily.   Yes Historical Provider, MD  atenolol (TENORMIN) 50 MG tablet Take 50 mg by mouth daily.   Yes Historical Provider, MD  atenolol-chlorthalidone (TENORETIC) 50-25 MG per tablet Take 1 tablet by mouth daily.   Yes Historical Provider, MD  atorvastatin (LIPITOR) 20 MG tablet Take 1 tablet (20 mg total) by mouth daily. 12/29/11 12/28/12 Yes Sarah L Weber, PA-C  COLCRYS 0.6 MG tablet TAKE TWO TABLETS, THEN TAKE ONE MORE TABLET AN HOUR LATER.  3 TABLETS TOTAL PER GOUT FLARE 11/10/11  Yes Morrell Riddle, PA-C  fish oil-omega-3 fatty acids 1000 MG capsule Take 1,000 g by mouth daily.   Yes Historical Provider, MD  glipiZIDE (GLUCOTROL) 10 MG tablet Take 1 tablet (10 mg total) by mouth 2 (two) times daily before a meal. 03/20/12  Yes Jonita Albee, MD  indomethacin (INDOCIN) 50 MG capsule TAKE 1 CAPSULE (50 MG TOTAL) BY MOUTH 3 (THREE) TIMES DAILY WITH MEALS. 11/10/11  Yes Morrell Riddle, PA-C  insulin glargine (LANTUS) 100 UNIT/ML injection Inject 92 Units into the  skin at bedtime. 12/12/11  Yes Heather Jaquita Rector, PA-C  metFORMIN (GLUCOPHAGE) 1000 MG tablet Take 1 tablet (1,000 mg total) by mouth 2 (two) times daily with a meal. 03/20/12  Yes Jonita Albee, MD  ofloxacin (OCUFLOX) 0.3 % ophthalmic solution Place 1 drop into the left eye every 4 (four) hours. 03/20/12  Yes Jonita Albee, MD  ranitidine (ZANTAC) 75 MG tablet Take 75 mg by mouth 2 (two) times daily.   Yes Historical Provider, MD     ROS: The patient denies fevers, chills, night sweats, unintentional weight loss, chest pain, palpitations, wheezing, dyspnea on exertion, nausea, vomiting, abdominal pain, dysuria, hematuria, melena, numbness, weakness, or tingling.  All other systems have been reviewed and were otherwise negative with the exception of those mentioned in the HPI and as above.    PHYSICAL EXAM: Filed Vitals:   04/21/12 1906  BP: 178/100  Pulse: 83  Temp: 98.3 F (36.8 C)  Resp: 18   Filed Vitals:   04/21/12 1906  Height: 6\' 1"  (1.854 m)  Weight: 391 lb (177.356 kg)   Body mass index is 51.59 kg/(m^2).  General: Alert, no acute distress HEENT:  Normocephalic, atraumatic, oropharynx patent.  Cardiovascular:  Regular rate and rhythm, no rubs murmurs or gallops.  No Carotid bruits, radial pulse intact. No pedal edema.  Respiratory: Clear to auscultation bilaterally.  No wheezes, rales, or rhonchi.  No cyanosis, no use of accessory musculature GI: No organomegaly, abdomen is soft and non-tender, positive bowel sounds.  No masses. Skin: + bilateral hand rash, dark, thick, hyperpigmented skin. + cracks.  Neurologic: Facial musculature symmetric. Psychiatric: Patient is appropriate throughout our interaction. Lymphatic: No cervical lymphadenopathy Musculoskeletal: Gait intact.   LABS: Results for orders placed in visit on 03/20/12  WOUND CULTURE      Component Value Range   Culture Abundant STAPHYLOCOCCUS AUREUS     GRAM STAIN No WBC Seen     GRAM STAIN No Squamous  Epithelial Cells Seen     GRAM STAIN No Organisms Seen     Organism ID, Bacteria STAPHYLOCOCCUS AUREUS    GLUCOSE, POCT (MANUAL RESULT ENTRY)      Component Value Range   POC Glucose 116 (*) 70 - 99 mg/dl  POCT GLYCOSYLATED HEMOGLOBIN (HGB A1C)      Component Value Range   Hemoglobin A1C 5.9       EKG/XRAY:   Primary read interpreted by [facility]. Conley Rolls at [facility].   ASSESSMENT/PLAN: Encounter Diagnoses  Name Primary?  . Rash Yes  . Contact dermatitis   . Eczema    Tb screening is negative Dermatitis and dry skin  Moisturize with heavy ointment meds ie vasoline, aquaphor,  eucerin  Clobetasol ointment prn for itching Change latex gloves to nitrile gloves at work, use warm water not hot water to wash hands F/u prn    LE, THAO PHUONG, DO 04/21/2012 7:54 PM

## 2012-05-01 ENCOUNTER — Telehealth: Payer: Self-pay

## 2012-05-01 NOTE — Telephone Encounter (Signed)
Patient calling for refills on his meds. He states he called his pharmacy and they were supposed to contact us.  [phone]  [facility]- [address]

## 2012-05-03 MED ORDER — AMLODIPINE BESYLATE 10 MG PO TABS: 10.0000 mg | ORAL_TABLET | Freq: Every day | ORAL | Status: DC

## 2012-05-03 MED ORDER — ATENOLOL-CHLORTHALIDONE 50-25 MG PO TABS: 1.0000 | ORAL_TABLET | Freq: Every day | ORAL | Status: DC

## 2012-05-03 NOTE — Telephone Encounter (Signed)
Called pt to see what medications he needs specifically. LMOM to CB.

## 2012-05-03 NOTE — Telephone Encounter (Signed)
He called back, and meds he needs are sent.

## 2012-06-20 ENCOUNTER — Other Ambulatory Visit: Payer: Self-pay | Admitting: Physician Assistant

## 2012-07-20 ENCOUNTER — Telehealth: Payer: Self-pay

## 2012-07-20 NOTE — Telephone Encounter (Signed)
Patient needs his lantus and it is mail order would like for [place] to call him about this please call him at [phone]

## 2012-07-21 NOTE — Telephone Encounter (Signed)
Sent form into [employer] but advised pt that he needs to come in for diabetes follow up.

## 2012-08-01 ENCOUNTER — Ambulatory Visit (INDEPENDENT_AMBULATORY_CARE_PROVIDER_SITE_OTHER): Payer: BC Managed Care – PPO | Admitting: Physician Assistant

## 2012-08-01 VITALS — BP 148/86 | HR 73 | Temp 98.4°F | Resp 16 | Ht 73.0 in | Wt 394.0 lb

## 2012-08-01 DIAGNOSIS — E119 Type 2 diabetes mellitus without complications: Secondary | ICD-10-CM

## 2012-08-01 DIAGNOSIS — M109 Gout, unspecified: Secondary | ICD-10-CM | POA: Insufficient documentation

## 2012-08-01 DIAGNOSIS — I10 Essential (primary) hypertension: Secondary | ICD-10-CM

## 2012-08-01 DIAGNOSIS — Z6841 Body Mass Index (BMI) 40.0 and over, adult: Secondary | ICD-10-CM | POA: Insufficient documentation

## 2012-08-01 DIAGNOSIS — E785 Hyperlipidemia, unspecified: Secondary | ICD-10-CM

## 2012-08-01 DIAGNOSIS — E669 Obesity, unspecified: Secondary | ICD-10-CM

## 2012-08-01 LAB — COMPREHENSIVE METABOLIC PANEL
Albumin: 4 g/dL (ref 3.5–5.2)
BUN: 10 mg/dL (ref 6–23)
CO2: 26 mEq/L (ref 19–32)
Glucose, Bld: 193 mg/dL — ABNORMAL HIGH (ref 70–99)
Potassium: 3.7 mEq/L (ref 3.5–5.3)
Sodium: 137 mEq/L (ref 135–145)
Total Bilirubin: 1.3 mg/dL — ABNORMAL HIGH (ref 0.3–1.2)
Total Protein: 7.5 g/dL (ref 6.0–8.3)

## 2012-08-01 LAB — LIPID PANEL
Cholesterol: 189 mg/dL (ref 0–200)
HDL: 36 mg/dL — ABNORMAL LOW (ref >39)
Triglycerides: 164 mg/dL — ABNORMAL HIGH (ref <150)

## 2012-08-01 MED ORDER — METFORMIN HCL 1000 MG PO TABS: 1000.0000 mg | ORAL_TABLET | Freq: Two times a day (BID) | ORAL | Status: DC

## 2012-08-01 MED ORDER — AMLODIPINE BESYLATE 10 MG PO TABS: 10.0000 mg | ORAL_TABLET | Freq: Every day | ORAL | Status: DC

## 2012-08-01 MED ORDER — GLIPIZIDE 10 MG PO TABS
5.0000 mg | ORAL_TABLET | Freq: Two times a day (BID) | ORAL | Status: DC
Start: 2012-08-01 — End: 2012-10-06

## 2012-08-01 MED ORDER — ATENOLOL-CHLORTHALIDONE 50-25 MG PO TABS: 1.0000 | ORAL_TABLET | Freq: Every day | ORAL | Status: DC

## 2012-08-01 MED ORDER — SITAGLIPTIN PHOSPHATE 100 MG PO TABS: 100.0000 mg | ORAL_TABLET | Freq: Every day | ORAL | Status: DC

## 2012-08-01 MED ORDER — INDOMETHACIN 50 MG PO CAPS: 50.0000 mg | ORAL_CAPSULE | Freq: Three times a day (TID) | ORAL | Status: DC | PRN

## 2012-08-01 NOTE — Progress Notes (Signed)
Subjective:    Patient ID: Justin Houston, male    DOB: 05-12-1965, 47 y.o.   MRN: [id number]  HPI This 47 y.o. male presents for evaluation of DM type 2, HTN, hyperlipidemia and obesity.  It has been at least 15 months since I last saw him, as he has seen my colleagues as his schedule permits.  He's been working full-time since February and now has health insurance again, for the first time in years.   At his last visit, his A1C was 5.9, so glipizide was reduced to 10 mg QD.  He's looking at a part-time job in addition, and needs a form completed for that.  Frequency of home glucose monitoring: most mornings, 100-120 Now that he has health insurance, with dental and vision.  Saw DDS several months ago, and plans to see an eye specialist in the next few weeks. Checks feet daily, and notes some tingling in the RIGHT great toe. Is current with influenza vaccine. Is current with pneumococcal vaccine.   Past Medical History  Diagnosis Date  . Obesity   . Lipoma of scalp   . Diabetes mellitus   . Hypertension   . Hyperlipidemia     Past Surgical History  Procedure Laterality Date  . Right arm orif      Prior to Admission medications   Medication Sig Start Date End Date Taking? Authorizing Provider  amLODipine (NORVASC) 10 MG tablet Take 1 tablet (10 mg total) by mouth daily. 05/03/12  Yes Ryan M Dunn, PA-C  atenolol (TENORMIN) 50 MG tablet Take 50 mg by mouth daily.   Yes Historical Provider, MD  atenolol-chlorthalidone (TENORETIC) 50-25 MG per tablet Take 1 tablet by mouth daily. 05/03/12  Yes Ryan M Dunn, PA-C  atorvastatin (LIPITOR) 20 MG tablet TAKE 1 TABLET (20 MG TOTAL) BY MOUTH DAILY. 06/20/12  Yes Victorious Kundinger S Nina Mondor, PA-C  COLCRYS 0.6 MG tablet TAKE TWO TABLETS, THEN TAKE ONE MORE TABLET AN HOUR LATER.  3 TABLETS TOTAL PER GOUT FLARE 11/10/11  Yes Morrell Riddle, PA-C  glipiZIDE (GLUCOTROL) 10 MG tablet Take 1 tablet (10 mg total) by mouth 2 (two) times daily before a meal.  03/20/12 Taking 1 QD due to A1C 5.9 in 03/2012 Yes Jonita Albee, MD  indomethacin (INDOCIN) 50 MG capsule Take 1 capsule (50 mg total) by mouth 3 (three) times daily with meals as needed (gout or joint pain). 08/01/12  Yes Manie Bealer S Boneta Standre, PA-C  insulin glargine (LANTUS) 100 UNIT/ML injection Inject 92 Units into the skin at bedtime. 12/12/11  Yes Heather Jaquita Rector, PA-C  metFORMIN (GLUCOPHAGE) 1000 MG tablet Take 1 tablet (1,000 mg total) by mouth 2 (two) times daily with a meal. 03/20/12  Yes Jonita Albee, MD  ranitidine (ZANTAC) 75 MG tablet Take 75 mg by mouth 2 (two) times daily.   Yes Historical Provider, MD  clobetasol ointment (TEMOVATE) 0.05 % Apply topically 3 (three) times daily. 04/21/12   Thao P Le, DO    Allergies  Allergen Reactions  . Losartan Potassium-Hctz Hives    History   Social History  . Marital Status: Single    Spouse Name: n/a    Number of Children: 0  . Years of Education: 64   Occupational History  . [employer] Teacher Assistant [employer]   Social History Main Topics  . Smoking status: Never Smoker   . Smokeless tobacco: Never Used  . Alcohol Use: No  . Drug Use: No  . Sexually Active:  No   Other Topics Concern  . Not on file   Social History Narrative   Master's Degree in Adult Education.  Lives alone.    Family History  Problem Relation Age of Onset  . Stroke Mother   . Hypertension Mother    Review of Systems Denies chest pain, shortness of breath, HA, dizziness, vision change, nausea, vomiting, diarrhea, constipation, melena, hematochezia, dysuria, increased urinary urgency or frequency, increased hunger or thirst, unintentional weight change, unexplained myalgias or arthralgias, rash.     Objective:   Physical Exam Blood pressure 148/86, pulse 73, temperature 98.4 F (36.9 C), temperature source Oral, resp. rate 16, height 6\' 1"  (1.854 m), weight 394 lb (178.717 kg), SpO2 98.00%. Body mass index is 51.99 kg/(m^2). Well-developed,  well nourished morbidly obese BM who is awake, alert and oriented, in NAD. HEENT: [place]/AT (lipoma RIGHT scalp), PERRL, EOMI.  Sclera and conjunctiva are clear.  EAC are patent, TMs are normal in appearance. Nasal mucosa is pink and moist. OP is clear. Neck: supple, non-tender, no lymphadenopathy, thyromegaly. Heart: RRR, no murmur Lungs: normal effort, CTA Extremities: no cyanosis, clubbing or edema. Skin: warm and dry without rash. Psychologic: good mood and appropriate affect, normal speech and behavior.  See DM foot Exam.  Results for orders placed in visit on 08/01/12  GLUCOSE, POCT (MANUAL RESULT ENTRY)      Result Value Range   POC Glucose 183 (*) 70 - 99 mg/dl  POCT GLYCOSYLATED HEMOGLOBIN (HGB A1C)      Result Value Range   Hemoglobin A1C 8.1        Assessment & Plan:  Diabetes mellitus - Plan: POCT glucose (manual entry), POCT glycosylated hemoglobin (Hb A1C), Comprehensive metabolic panel, Microalbumin, urine, Ambulatory referral to diabetic education, metFORMIN (GLUCOPHAGE) 1000 MG tablet, glipiZIDE (GLUCOTROL) 10 MG tablet (NOW TAKE 1/2 TABLET BID, instead of 1 tablet QD); He may need a new Rx for Lantus, as the prescription assitance program only provides meds if there is no insurance.  If ne needs a new RX, I'll send in Lantus vials, 10 ml, #3 vials RF x 2 OR #9 vials. RTC 3 months.  Hypertension - Plan: Continue current treatment. Refills: amLODipine (NORVASC) 10 MG tablet, atenolol-chlorthalidone (TENORETIC) 50-25 MG per tablet  Hyperlipidemia - Plan: Lipid panel  Obesity - Plan: Ambulatory referral to diabetic education  Gout - Plan: indomethacin (INDOCIN) 50 MG capsule  Fernande Bras, PA-C Physician Assistant-Certified [facility]

## 2012-08-01 NOTE — Patient Instructions (Addendum)
Continue your other medications as before, EXCEPT: take Glipizide 10 mg ONE-HALF tablet (5 mg) TWICE each day (instead of 1 tablet (10 mg) once each day).   Keep working on healthy eating and regular exercise for weight loss.  I've also referred you for diabetes and nutrition counseling to see if there are ways they can help you. I will contact you with your lab results as soon as they are available.   If you have not heard from me in 2 weeks, please contact me. The fastest way to get your results is to register for My Chart (see the instructions on the last page of this printout).

## 2012-08-03 ENCOUNTER — Encounter: Payer: Self-pay | Admitting: Family Medicine

## 2012-08-03 MED ORDER — ATORVASTATIN CALCIUM 40 MG PO TABS: 40.0000 mg | ORAL_TABLET | Freq: Every day | ORAL | Status: DC

## 2012-08-03 NOTE — Addendum Note (Signed)
Addended by: Johnnette Litter on: 08/03/2012 02:15 PM   Modules accepted: Orders

## 2012-08-07 ENCOUNTER — Other Ambulatory Visit: Payer: Self-pay | Admitting: Physician Assistant

## 2012-08-11 ENCOUNTER — Other Ambulatory Visit: Payer: Self-pay | Admitting: *Deleted

## 2012-08-11 MED ORDER — "INSULIN SYRINGE 31G X 5/16"" 1 ML MISC": Status: DC

## 2012-08-11 MED ORDER — INSULIN GLARGINE 100 UNIT/ML ~~LOC~~ SOLN: 92.0000 [IU] | Freq: Every day | SUBCUTANEOUS | Status: DC

## 2012-08-11 NOTE — Addendum Note (Signed)
Addended by: Thelma Barge D on: 08/11/2012 04:53 PM   Modules accepted: Orders

## 2012-09-28 ENCOUNTER — Telehealth: Payer: Self-pay

## 2012-09-28 NOTE — Telephone Encounter (Signed)
chelle can you sign paper work and give back to me (sheketia) at TL station.  Form is in your box in Dr. Prince Rome

## 2012-09-28 NOTE — Telephone Encounter (Signed)
Signed and returned to [place].

## 2012-09-28 NOTE — Telephone Encounter (Signed)
PT WOULD LIKE A CALL BACK FROM SHEKETIA. PLEASE CALL [phone]

## 2012-09-28 NOTE — Telephone Encounter (Signed)
Will get chelle to sign papers and fax.

## 2012-10-03 ENCOUNTER — Other Ambulatory Visit: Payer: Self-pay | Admitting: Internal Medicine

## 2012-10-05 NOTE — Addendum Note (Signed)
Addended by: Sheppard Plumber A on: 10/05/2012 11:24 AM   Modules accepted: Orders, Medications

## 2012-10-05 NOTE — Telephone Encounter (Signed)
Called pharmacy and cancelled refill of glipizide at 1 tab BID d/t wrong dosage. Pharmacy stated they have just filled correct dose Rx for pt.

## 2012-10-06 ENCOUNTER — Other Ambulatory Visit: Payer: Self-pay | Admitting: *Deleted

## 2012-10-06 DIAGNOSIS — E119 Type 2 diabetes mellitus without complications: Secondary | ICD-10-CM

## 2012-10-06 MED ORDER — GLIPIZIDE 10 MG PO TABS: 5.0000 mg | ORAL_TABLET | Freq: Two times a day (BID) | ORAL | Status: DC

## 2012-12-27 ENCOUNTER — Other Ambulatory Visit: Payer: Self-pay | Admitting: Physician Assistant

## 2012-12-27 NOTE — Telephone Encounter (Signed)
Needs OV, was due for f/u in July 2014

## 2013-01-08 ENCOUNTER — Ambulatory Visit (INDEPENDENT_AMBULATORY_CARE_PROVIDER_SITE_OTHER): Payer: BC Managed Care – PPO | Admitting: Physician Assistant

## 2013-01-08 VITALS — BP 142/84 | HR 72 | Temp 98.7°F | Resp 18 | Ht 71.0 in | Wt 375.4 lb

## 2013-01-08 DIAGNOSIS — E785 Hyperlipidemia, unspecified: Secondary | ICD-10-CM

## 2013-01-08 DIAGNOSIS — B079 Viral wart, unspecified: Secondary | ICD-10-CM

## 2013-01-08 DIAGNOSIS — I10 Essential (primary) hypertension: Secondary | ICD-10-CM

## 2013-01-08 DIAGNOSIS — E669 Obesity, unspecified: Secondary | ICD-10-CM

## 2013-01-08 DIAGNOSIS — E119 Type 2 diabetes mellitus without complications: Secondary | ICD-10-CM

## 2013-01-08 DIAGNOSIS — Z23 Encounter for immunization: Secondary | ICD-10-CM

## 2013-01-08 LAB — COMPREHENSIVE METABOLIC PANEL
ALT: 15 U/L (ref 0–53)
AST: 16 U/L (ref 0–37)
Alkaline Phosphatase: 135 U/L — ABNORMAL HIGH (ref 39–117)
CO2: 28 mEq/L (ref 19–32)
Creat: 1.1 mg/dL (ref 0.50–1.35)
Sodium: 134 mEq/L — ABNORMAL LOW (ref 135–145)
Total Bilirubin: 1.4 mg/dL — ABNORMAL HIGH (ref 0.3–1.2)
Total Protein: 7.6 g/dL (ref 6.0–8.3)

## 2013-01-08 LAB — POCT GLYCOSYLATED HEMOGLOBIN (HGB A1C): Hemoglobin A1C: 11.1

## 2013-01-08 LAB — LIPID PANEL
Cholesterol: 130 mg/dL (ref 0–200)
HDL: 28 mg/dL — ABNORMAL LOW (ref >39)
LDL Cholesterol: 63 mg/dL (ref 0–99)
Total CHOL/HDL Ratio: 4.6 Ratio
VLDL: 39 mg/dL (ref 0–40)

## 2013-01-08 MED ORDER — ATENOLOL-CHLORTHALIDONE 50-25 MG PO TABS: 1.0000 | ORAL_TABLET | Freq: Every day | ORAL | Status: DC

## 2013-01-08 MED ORDER — ATENOLOL 50 MG PO TABS: 50.0000 mg | ORAL_TABLET | Freq: Every day | ORAL | Status: DC

## 2013-01-08 MED ORDER — RANITIDINE HCL 75 MG PO TABS: 75.0000 mg | ORAL_TABLET | Freq: Two times a day (BID) | ORAL | Status: DC

## 2013-01-08 MED ORDER — ATORVASTATIN CALCIUM 40 MG PO TABS: 40.0000 mg | ORAL_TABLET | Freq: Every day | ORAL | Status: DC

## 2013-01-08 MED ORDER — GLIPIZIDE 10 MG PO TABS: 10.0000 mg | ORAL_TABLET | Freq: Two times a day (BID) | ORAL | Status: DC

## 2013-01-08 MED ORDER — INSULIN GLARGINE 100 UNITS/ML SOLOSTAR PEN: 92.0000 [IU] | PEN_INJECTOR | Freq: Every day | SUBCUTANEOUS | Status: DC

## 2013-01-08 MED ORDER — INSULIN PEN NEEDLE 30G X 5 MM MISC: 1.0000 [IU] | Freq: Every day | Status: DC

## 2013-01-08 MED ORDER — AMLODIPINE BESYLATE 10 MG PO TABS: 10.0000 mg | ORAL_TABLET | Freq: Every day | ORAL | Status: DC

## 2013-01-08 MED ORDER — GLIPIZIDE 10 MG PO TABS: 5.0000 mg | ORAL_TABLET | Freq: Two times a day (BID) | ORAL | Status: DC

## 2013-01-08 MED ORDER — METFORMIN HCL 1000 MG PO TABS: 1000.0000 mg | ORAL_TABLET | Freq: Two times a day (BID) | ORAL | Status: DC

## 2013-01-08 NOTE — Patient Instructions (Addendum)
Increase the glipizide from 1/2 tablet twice daily, to 1 (WHOLE) tablet twice daily. Check your blood sugar several times a week, alternating fasting (nothing to eat or drink for 8-12 hours) and post-prandial (2-3 hours after a meal). Record the results and bring them in for me to review in 1 month.  I will contact you with your lab results as soon as they are available.   If you have not heard from me in 2 weeks, please contact me.  The fastest way to get your results is to register for My Chart (see the instructions on the last page of this printout).  Wash the wound on your wrist twice daily with soap and water.  Apply a dab of antibiotic ointment and bandaid on it when it gets "angry" from the freezing.

## 2013-01-08 NOTE — Progress Notes (Signed)
Subjective:    Patient ID: Justin Houston, male    DOB: 18-Mar-1966, 47 y.o.   MRN: [id number]  HPI This 47 y.o. male presents for evaluation of his chronic medical problems.  He was last here in 07/2012.  Patient Active Problem List   Diagnosis Date Noted  . Gout 08/01/2012  . Obesity 08/01/2012  . Hypertension 07/20/2011  . Diabetes mellitus 07/20/2011  . Hyperlipidemia 07/20/2011   Medications, allergies, past medical history, surgical history, family history, social history and problem list reviewed.  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  Frequency of home glucose monitoring: doesn't check regularly Sees a dentist Q6 months, eye specialist annually (01/2013). Checks feet daily. Is not current with influenza vaccine. Will get this season's dose today. Is current with pneumococcal vaccine. He'd like to switch Lantus to the [employer], which he's been told will be less expensive.   Review of Systems Denies chest pain, shortness of breath, HA, dizziness, vision change, nausea, vomiting, diarrhea, constipation, melena, hematochezia, dysuria, increased urinary urgency or frequency, increased hunger or thirst, unintentional weight change, unexplained myalgias or arthralgias, rash.     Objective:   Physical Exam  Constitutional: He is oriented to person, place, and time. Vital signs are normal. He appears well-developed and well-nourished. He is active and cooperative. No distress.  HENT:  Head: Normocephalic and atraumatic.  Right Ear: Hearing normal.  Left Ear: Hearing normal.  Eyes: EOM are normal. Pupils are equal, round, and reactive to light.  Neck: Normal range of motion. Neck supple. No thyromegaly present.  Cardiovascular: Normal rate, regular rhythm and normal heart sounds.   Pulses:      Radial pulses are 2+ on the right side, and 2+ on the left side.       Dorsalis pedis pulses are 2+ on the right side, and 2+ on the left side.       Posterior  tibial pulses are 2+ on the right side, and 2+ on the left side.  Pulmonary/Chest: Effort normal and breath sounds normal.  Lymphadenopathy:       Head (right side): No tonsillar, no preauricular, no posterior auricular and no occipital adenopathy present.       Head (left side): No tonsillar, no preauricular, no posterior auricular and no occipital adenopathy present.    He has no cervical adenopathy.       Right: No supraclavicular adenopathy present.       Left: No supraclavicular adenopathy present.  Neurological: He is alert and oriented to person, place, and time. No sensory deficit.  Skin: Skin is warm, dry and intact. Lesion (verrucal lesion on the radial aspect on the dorsal LEFT wrist.) noted. No rash noted. No cyanosis or erythema. Nails show no clubbing.  Psychiatric: He has a normal mood and affect.   See DM foot exam.  Results for orders placed in visit on 01/08/13  GLUCOSE, POCT (MANUAL RESULT ENTRY)      Result Value Range   POC Glucose 357 (*) 70 - 99 mg/dl  POCT GLYCOSYLATED HEMOGLOBIN (HGB A1C)      Result Value Range   Hemoglobin A1C 11.1     A1C is up from 8.1% in 07/2012, up from 5.9% 03/2012. 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  The verrucal lesion on the LEFT wrist was frozen with  liquid nitrogen (freeze-thaw-freeze), which he tolerated well.     Assessment & Plan:  Diabetes mellitus, uncontrolled- Plan: POCT glucose (manual entry), POCT glycosylated hemoglobin (Hb A1C), Microalbumin, urine, Comprehensive metabolic panel, HM Diabetes Foot Exam, HM Diabetes Eye Exam, insulin glargine (LANTUS) 100 units/mL SOLN, Insulin Pen Needle 30G X 5 MM MISC, metFORMIN (GLUCOPHAGE) 1000 MG tablet, Ambulatory referral to diabetic education, glipiZIDE (GLUCOTROL) 10 MG tablet (INCREASED from 5 mg BID to 10 mg BID); RTC 4  weeks.  Hyperlipidemia - Plan: Lipid panel  Hypertension - Plan: amLODipine (NORVASC) 10 MG tablet, atenolol-chlorthalidone (TENORETIC) 50-25 MG per tablet  Obesity - Plan: Ambulatory referral to diabetic education; continue efforts for increasing exercise, healthy eating and weight loss.  Need for influenza vaccination - Plan: Flu Vaccine QUAD 36+ mos IM  Wart - s/p cryotherapy.  Local wound care.  Re-evaluate in 4 weeks.  Fernande Bras, PA-C Physician Assistant-Certified [facility]

## 2013-01-11 ENCOUNTER — Other Ambulatory Visit: Payer: Self-pay

## 2013-01-11 ENCOUNTER — Encounter: Payer: Self-pay | Admitting: Physician Assistant

## 2013-01-11 MED ORDER — INSULIN GLARGINE 100 UNIT/ML SOLOSTAR PEN: PEN_INJECTOR | SUBCUTANEOUS | Status: DC

## 2013-02-10 ENCOUNTER — Telehealth: Payer: Self-pay

## 2013-02-10 MED ORDER — INSULIN GLARGINE 100 UNIT/ML ~~LOC~~ SOLN: 92.0000 [IU] | Freq: Every day | SUBCUTANEOUS | Status: DC

## 2013-02-10 NOTE — Telephone Encounter (Signed)
Pt advised.

## 2013-02-10 NOTE — Telephone Encounter (Signed)
Meds ordered this encounter  Medications  . insulin glargine (LANTUS) 100 UNIT/ML injection    Sig: Inject 0.92 mLs (92 Units total) into the skin at bedtime.    Dispense:  30 mL    Refill:  12    Order Specific Question:  Supervising Provider    Answer:  Merla Riches, ROBERT P [[id number]]

## 2013-02-10 NOTE — Telephone Encounter (Signed)
Patient wants to switch back to the vials on insulin.   Justin Houston  [place]   [phone]

## 2013-02-23 ENCOUNTER — Ambulatory Visit: Payer: Self-pay | Admitting: *Deleted

## 2013-05-10 ENCOUNTER — Telehealth: Payer: Self-pay

## 2013-05-10 NOTE — Telephone Encounter (Signed)
Patient states that he completed a ROI form on Friday in the hopes that he would be able to get his medical records for a doctor's appointment that he has on Tuesday. Informed patient that medical records requests take 5-7 business days to process.   [phone]

## 2013-05-11 LAB — HM DIABETES EYE EXAM

## 2013-05-11 NOTE — Telephone Encounter (Signed)
Patient did sign a ROI form and records are ready for pick up today. Patient notified.

## 2013-05-26 ENCOUNTER — Encounter: Payer: Self-pay | Admitting: Physician Assistant

## 2013-05-26 DIAGNOSIS — H409 Unspecified glaucoma: Secondary | ICD-10-CM | POA: Insufficient documentation

## 2013-05-28 ENCOUNTER — Other Ambulatory Visit: Payer: Self-pay | Admitting: Physician Assistant

## 2013-07-08 ENCOUNTER — Other Ambulatory Visit: Payer: Self-pay | Admitting: Physician Assistant

## 2013-09-04 ENCOUNTER — Ambulatory Visit (INDEPENDENT_AMBULATORY_CARE_PROVIDER_SITE_OTHER): Payer: BC Managed Care – PPO | Admitting: Family Medicine

## 2013-09-04 VITALS — BP 140/92 | HR 76 | Temp 98.0°F | Resp 18 | Ht 71.5 in | Wt 387.0 lb

## 2013-09-04 DIAGNOSIS — L02214 Cutaneous abscess of groin: Secondary | ICD-10-CM

## 2013-09-04 DIAGNOSIS — E1165 Type 2 diabetes mellitus with hyperglycemia: Secondary | ICD-10-CM

## 2013-09-04 DIAGNOSIS — IMO0002 Reserved for concepts with insufficient information to code with codable children: Secondary | ICD-10-CM

## 2013-09-04 DIAGNOSIS — IMO0001 Reserved for inherently not codable concepts without codable children: Secondary | ICD-10-CM

## 2013-09-04 DIAGNOSIS — L03319 Cellulitis of trunk, unspecified: Secondary | ICD-10-CM

## 2013-09-04 DIAGNOSIS — L02219 Cutaneous abscess of trunk, unspecified: Secondary | ICD-10-CM

## 2013-09-04 LAB — POCT CBC
GRANULOCYTE PERCENT: 63.4 % (ref 37–80)
HCT, POC: 40.8 % — AB (ref 43.5–53.7)
Hemoglobin: 13.2 g/dL — AB (ref 14.1–18.1)
Lymph, poc: 2.1 (ref 0.6–3.4)
MCH, POC: 29.1 pg (ref 27–31.2)
MCHC: 32.4 g/dL (ref 31.8–35.4)
MCV: 89.8 fL (ref 80–97)
MID (CBC): 0.4 (ref 0–0.9)
MPV: 11 fL (ref 0–99.8)
PLATELET COUNT, POC: 211 10*3/uL (ref 142–424)
POC Granulocyte: 4.4 (ref 2–6.9)
POC LYMPH %: 30.2 % (ref 10–50)
POC MID %: 6.4 % (ref 0–12)
RBC: 4.54 M/uL — AB (ref 4.69–6.13)
RDW, POC: 14.1 %
WBC: 7 10*3/uL (ref 4.6–10.2)

## 2013-09-04 LAB — GLUCOSE, POCT (MANUAL RESULT ENTRY): POC Glucose: 139 mg/dl — AB (ref 70–99)

## 2013-09-04 LAB — POCT GLYCOSYLATED HEMOGLOBIN (HGB A1C): Hemoglobin A1C: 7.7

## 2013-09-04 MED ORDER — DOXYCYCLINE HYCLATE 100 MG PO TABS: 100.0000 mg | ORAL_TABLET | Freq: Two times a day (BID) | ORAL | Status: DC

## 2013-09-04 NOTE — Progress Notes (Addendum)
Subjective:    Patient ID: Justin Houston, male    DOB: April 09, 1966, 48 y.o.   MRN: [id number]  This chart was scribed for Justin Ray, MD by Erling Conte, Medical Scribe. This patient was seen in Room 2 and the patient's care was started at 11:01 AM.  Chief Complaint  Patient presents with  . Groin Swelling    L side groin swelling and pain x 1 wk    HPI  HPI Comments: Justin Houston is a 48 y.o. male who presents to the [facility] complaining of gradually worsening, left sided swelling in his groin  for 1 week. Patient states that it started out really painful and swelled up. Patient states that the pain has somewhat improved but that the swelling is still present. Patient reports that he had an infection in his groin about 2 years ago but he said that these symptoms are mild compared to his previous infection. Patient states that the pain and swelling is not exacerbated by lifting, straining or bowel movements. Patient denies any personal history of hernias. Patient denies any fever, chills, or discharge.  Patient has a history of DM. He states that he has not checked his blood sugar this week. Patient was last seen in the office on 01/08/13 by Daphane Shepherd, PA-C. At this visit his A1-C was 11.1, he was referred to a diabetic educator but states he did not attend the education classes due to schedule conflict. His Glipizide medication was increased to 10mg , 2x daily at this visit. Patient is taking Metformin, insulin and HTN medication and states that he regular takes his medication. He states he takes 92 units of Lantus in the morning. Patient weight was 375 lbs at his appt on 01/08/13 and at this visit his weight is 387 lbs.     Patient Active Problem List   Diagnosis Date Noted  . Glaucoma 05/26/2013  . Gout 08/01/2012  . Obesity 08/01/2012  . Hypertension 07/20/2011  . Type II or unspecified type diabetes mellitus without mention of complication,  uncontrolled 07/20/2011  . Hyperlipidemia 07/20/2011   Past Medical History  Diagnosis Date  . Obesity   . Lipoma of scalp   . Diabetes mellitus   . Hypertension   . Hyperlipidemia    Past Surgical History  Procedure Laterality Date  . Right arm orif     Allergies  Allergen Reactions  . Losartan Potassium-Hctz Hives   Prior to Admission medications   Medication Sig Start Date End Date Taking? Authorizing Provider  amLODipine (NORVASC) 10 MG tablet Take 1 tablet (10 mg total) by mouth daily. 01/08/13  Yes Chelle S Jeffery, PA-C  atenolol (TENORMIN) 50 MG tablet Take 1 tablet (50 mg total) by mouth daily. 01/08/13  Yes Chelle S Jeffery, PA-C  atenolol-chlorthalidone (TENORETIC) 50-25 MG per tablet Take 1 tablet by mouth daily. 01/08/13  Yes Chelle Janalee Dane, PA-C  B-D INS SYR ULTRAFINE 1CC/31G 31G X 5/16" 1 ML MISC USE AS DIRECTED ONCE A DAY WITH LANTUS   Yes Chelle S Jeffery, PA-C  clobetasol ointment (TEMOVATE) 0.05 % Apply topically 3 (three) times daily. 04/21/12  Yes Thao P Le, DO  COLCRYS 0.6 MG tablet TAKE TWO TABLETS, THEN TAKE ONE MORE TABLET AN HOUR LATER.  3 TABLETS TOTAL PER GOUT FLARE 11/10/11  Yes Mancel Bale, PA-C  glipiZIDE (GLUCOTROL) 10 MG tablet Take 1 tablet (10 mg total) by mouth 2 (two) times daily before a meal. 01/08/13  Yes Chelle S Jeffery, PA-C  indomethacin (INDOCIN) 50 MG capsule Take 1 capsule (50 mg total) by mouth 3 (three) times daily as needed. PATIENT NEEDS OFFICE VISIT FOR ADDITIONAL REFILLS   Yes Heather M Marte, PA-C  insulin glargine (LANTUS) 100 UNIT/ML injection Inject 0.92 mLs (92 Units total) into the skin at bedtime. 02/10/13  Yes Chelle S Jeffery, PA-C  Insulin Pen Needle 30G X 5 MM MISC 1 Units by Does not apply route daily. 01/08/13  Yes Chelle S Jeffery, PA-C  metFORMIN (GLUCOPHAGE) 1000 MG tablet Take 1 tablet (1,000 mg total) by mouth 2 (two) times daily with a meal. 01/08/13  Yes Chelle S Jeffery, PA-C  ranitidine (ZANTAC) 75 MG tablet Take 1  tablet (75 mg total) by mouth 2 (two) times daily. 01/08/13  Yes Chelle S Jeffery, PA-C  atorvastatin (LIPITOR) 40 MG tablet Take 1 tablet (40 mg total) by mouth daily. 01/08/13   Fara Chute, PA-C   History   Social History  . Marital Status: Single    Spouse Name: n/a    Number of Children: 0  . Years of Education: 39   Occupational History  . [place] History Main Topics  . Smoking status: Former Smoker    Quit date: 09/05/1994  . Smokeless tobacco: Never Used  . Alcohol Use: No  . Drug Use: No  . Sexual Activity: No   Other Topics Concern  . Not on file   Social History Narrative   Master's Degree in Adult Education.  Lives alone.      Review of Systems  Constitutional: Negative for fever and chills.  Skin:       Swelling in the inguinal region. No discharge  All other systems reviewed and are negative.      Objective:   Physical Exam  Nursing note and vitals reviewed. Constitutional: He is oriented to person, place, and time. He appears well-developed and well-nourished. No distress.  HENT:  Head: Normocephalic and atraumatic.  Eyes: EOM are normal.  Neck: Neck supple. No tracheal deviation present.  Cardiovascular: Normal rate and regular rhythm.  Exam reveals no gallop and no friction rub.   No murmur heard. Pulmonary/Chest: Effort normal. No respiratory distress.  CTA  Abdominal: Soft. Hernia confirmed negative in the right inguinal area and confirmed negative in the left inguinal area.  Left suprapubic area just medial to inguinal fold, hard indurated area approx. 3-4 cm with 1 central flunctuant area in the upper outer portion. No discharge. Slight warmth of area. No rash otherwise  Genitourinary: Right testis shows no tenderness. Left testis shows no tenderness.  No hernia palpated  Musculoskeletal: Normal range of motion.  Neurological: He is alert and oriented to person, place, and time.  Skin: Skin  is warm and dry.  Psychiatric: He has a normal mood and affect. His behavior is normal.   Filed Vitals:   09/04/13 0941  BP: 140/92  Pulse: 76  Temp: 98 F (36.7 C)  TempSrc: Oral  Resp: 18  Height: 5' 11.5" (1.816 m)  Weight: 387 lb (175.542 kg)  SpO2: 97%   Results for orders placed in visit on 09/04/13  POCT CBC      Result Value Ref Range   WBC 7.0  4.6 - 10.2 K/uL   Lymph, poc 2.1  0.6 - 3.4   POC LYMPH PERCENT 30.2  10 - 50 %L   MID (cbc) 0.4  0 - 0.9  POC MID % 6.4  0 - 12 %M   POC Granulocyte 4.4  2 - 6.9   Granulocyte percent 63.4  37 - 80 %G   RBC 4.54 (*) 4.69 - 6.13 M/uL   Hemoglobin 13.2 (*) 14.1 - 18.1 g/dL   HCT, POC 40.8 (*) 43.5 - 53.7 %   MCV 89.8  80 - 97 fL   MCH, POC 29.1  27 - 31.2 pg   MCHC 32.4  31.8 - 35.4 g/dL   RDW, POC 14.1     Platelet Count, POC 211  142 - 424 K/uL   MPV 11.0  0 - 99.8 fL  POCT GLYCOSYLATED HEMOGLOBIN (HGB A1C)      Result Value Ref Range   Hemoglobin A1C 7.7    GLUCOSE, POCT (MANUAL RESULT ENTRY)      Result Value Ref Range   POC Glucose 139 (*) 70 - 99 mg/dl        Assessment & Plan:   CRISTHIAN VANHOOK is a 48 y.o. male DM (diabetes mellitus), type 2, uncontrolled - Plan: POCT CBC, POCT glycosylated hemoglobin (Hb A1C), POCT glucose (manual entry)  -not quite at goal, but much better than last September. No med changes at present. Work on diet and weight loss.  Discussed importance of regular follow up with PCP and diabetes care. Will have followed up for wound in 3 days with Chelle to touch base on diabetes, but anticipate same regimen with attention to diet/wt loss as above.  Bring record of home blood sugars. Can discuss diabetes classes further with Chelle in few days.   Abscess of groin - Plan: POCT CBC, POCT glycosylated hemoglobin (Hb A1C), POCT glucose (manual entry), Wound culture, doxycycline (VIBRA-TABS) 100 MG tablet  - I and D per procedure note, wound cx obtained. Start doxycycline. Recheck wound in 3  days. Sooner if worse.   Borderline HGB - can recheck at follow up in next few months - sooner if symptomatic. (denies hx of PUD, dark stools, or hx of anemia).    Meds ordered this encounter  Medications  . doxycycline (VIBRA-TABS) 100 MG tablet    Sig: Take 1 tablet (100 mg total) by mouth 2 (two) times daily.    Dispense:  20 tablet    Refill:  0   Patient Instructions  Your diabetes is not quite at goal, but much better than in the past. No changes to that regimen today, but keep a record of your blood sugars and bring them to the next office visit with Kieler.  For the infection - start antibiotic, warm compresses to area, change bandage once per day, and recheck Tuesday after 3pm with Dr. Carlota Raspberry or Daphane Shepherd, PA-C. Return to the clinic or go to the nearest emergency room if any of your symptoms worsen or new symptoms occur.   Abscess An abscess is an infected area that contains a collection of pus and debris.It can occur in almost any part of the body. An abscess is also known as a furuncle or boil. CAUSES  An abscess occurs when tissue gets infected. This can occur from blockage of oil or sweat glands, infection of hair follicles, or a minor injury to the skin. As the body tries to fight the infection, pus collects in the area and creates pressure under the skin. This pressure causes pain. People with weakened immune systems have difficulty fighting infections and get certain abscesses more often.  SYMPTOMS Usually an abscess develops on the skin  and becomes a painful mass that is red, warm, and tender. If the abscess forms under the skin, you may feel a moveable soft area under the skin. Some abscesses break open (rupture) on their own, but most will continue to get worse without care. The infection can spread deeper into the body and eventually into the bloodstream, causing you to feel ill.  DIAGNOSIS  Your caregiver will take your medical history and perform a physical exam. A  sample of fluid may also be taken from the abscess to determine what is causing your infection. TREATMENT  Your caregiver may prescribe antibiotic medicines to fight the infection. However, taking antibiotics alone usually does not cure an abscess. Your caregiver may need to make a small cut (incision) in the abscess to drain the pus. In some cases, gauze is packed into the abscess to reduce pain and to continue draining the area. HOME CARE INSTRUCTIONS   Only take over-the-counter or prescription medicines for pain, discomfort, or fever as directed by your caregiver.  If you were prescribed antibiotics, take them as directed. Finish them even if you start to feel better.  If gauze is used, follow your caregiver's directions for changing the gauze.  To avoid spreading the infection:  Keep your draining abscess covered with a bandage.  Wash your hands well.  Do not share personal care items, towels, or whirlpools with others.  Avoid skin contact with others.  Keep your skin and clothes clean around the abscess.  Keep all follow-up appointments as directed by your caregiver. SEEK MEDICAL CARE IF:   You have increased pain, swelling, redness, fluid drainage, or bleeding.  You have muscle aches, chills, or a general ill feeling.  You have a fever. MAKE SURE YOU:   Understand these instructions.  Will watch your condition.  Will get help right away if you are not doing well or get worse. Document Released: 01/09/2005 Document Revised: 10/01/2011 Document Reviewed: 06/14/2011 [facility] Patient Information 2014 [place].     I personally performed the services described in this documentation, which was scribed in my presence. The recorded information has been reviewed and considered, and addended by me as needed.

## 2013-09-04 NOTE — Progress Notes (Signed)
Procedure Note: Verbal consent obtained.  Local anesthesia with 1 cc 2% lidocaine.  Betadine prep.  Incision with 11 blade.  Moderate purulence expressed.  Wound irrigated with remaining anesthetic.  Packed with 1/4 inch plain packing.  Cleansed and dressed.  Discussed wound care.  Pt tolerated very well.

## 2013-09-04 NOTE — Patient Instructions (Signed)
Your diabetes is not quite at goal, but much better than in the past. No changes to that regimen today, but keep a record of your blood sugars and bring them to the next office visit with Justin Houston.  For the infection - start antibiotic, warm compresses to area, change bandage once per day, and recheck Tuesday after 3pm with Dr. Carlota Raspberry or Daphane Shepherd, PA-C. Return to the clinic or go to the nearest emergency room if any of your symptoms worsen or new symptoms occur.   Abscess An abscess is an infected area that contains a collection of pus and debris.It can occur in almost any part of the body. An abscess is also known as a furuncle or boil. CAUSES  An abscess occurs when tissue gets infected. This can occur from blockage of oil or sweat glands, infection of hair follicles, or a minor injury to the skin. As the body tries to fight the infection, pus collects in the area and creates pressure under the skin. This pressure causes pain. People with weakened immune systems have difficulty fighting infections and get certain abscesses more often.  SYMPTOMS Usually an abscess develops on the skin and becomes a painful mass that is red, warm, and tender. If the abscess forms under the skin, you may feel a moveable soft area under the skin. Some abscesses break open (rupture) on their own, but most will continue to get worse without care. The infection can spread deeper into the body and eventually into the bloodstream, causing you to feel ill.  DIAGNOSIS  Your caregiver will take your medical history and perform a physical exam. A sample of fluid may also be taken from the abscess to determine what is causing your infection. TREATMENT  Your caregiver may prescribe antibiotic medicines to fight the infection. However, taking antibiotics alone usually does not cure an abscess. Your caregiver may need to make a small cut (incision) in the abscess to drain the pus. In some cases, gauze is packed into the abscess  to reduce pain and to continue draining the area. HOME CARE INSTRUCTIONS   Only take over-the-counter or prescription medicines for pain, discomfort, or fever as directed by your caregiver.  If you were prescribed antibiotics, take them as directed. Finish them even if you start to feel better.  If gauze is used, follow your caregiver's directions for changing the gauze.  To avoid spreading the infection:  Keep your draining abscess covered with a bandage.  Wash your hands well.  Do not share personal care items, towels, or whirlpools with others.  Avoid skin contact with others.  Keep your skin and clothes clean around the abscess.  Keep all follow-up appointments as directed by your caregiver. SEEK MEDICAL CARE IF:   You have increased pain, swelling, redness, fluid drainage, or bleeding.  You have muscle aches, chills, or a general ill feeling.  You have a fever. MAKE SURE YOU:   Understand these instructions.  Will watch your condition.  Will get help right away if you are not doing well or get worse. Document Released: 01/09/2005 Document Revised: 10/01/2011 Document Reviewed: 06/14/2011 [facility] Patient Information 2014 [place].

## 2013-09-06 LAB — WOUND CULTURE
GRAM STAIN: NONE SEEN
Organism ID, Bacteria: NO GROWTH

## 2013-09-07 ENCOUNTER — Ambulatory Visit (INDEPENDENT_AMBULATORY_CARE_PROVIDER_SITE_OTHER): Payer: BC Managed Care – PPO | Admitting: Family Medicine

## 2013-09-07 VITALS — BP 136/88 | HR 70 | Temp 98.1°F | Resp 16 | Ht 71.0 in | Wt 388.8 lb

## 2013-09-07 DIAGNOSIS — L03319 Cellulitis of trunk, unspecified: Secondary | ICD-10-CM

## 2013-09-07 DIAGNOSIS — D1739 Benign lipomatous neoplasm of skin and subcutaneous tissue of other sites: Secondary | ICD-10-CM

## 2013-09-07 DIAGNOSIS — L02219 Cutaneous abscess of trunk, unspecified: Secondary | ICD-10-CM

## 2013-09-07 DIAGNOSIS — L02214 Cutaneous abscess of groin: Secondary | ICD-10-CM

## 2013-09-07 DIAGNOSIS — D170 Benign lipomatous neoplasm of skin and subcutaneous tissue of head, face and neck: Secondary | ICD-10-CM

## 2013-09-07 NOTE — Patient Instructions (Signed)
We are referring you to surgeon for evaluation of your scalp lipoma.   Keep warm compresses to wound and bandage changes - recheck Thursday with Dr. Carlota Raspberry.

## 2013-09-07 NOTE — Progress Notes (Signed)
Subjective:    Patient ID: Justin Houston, male    DOB: [birth year], 48 y.o.   MRN: [id number]  HPI Justin Houston is a 48 y.o. male  See ov 3 days ago - L groin abscess - s/p I and D, started doxycycline.  Packing placed. Here for follow up. Packing fell out yesterday, using warm compresses multiple times daily and changing bandages.  No fever or spread of redness. Feels much better overall.   Hx of lipoma on L scalp -  For years, but seems to be enlarging. Would like to have evaluated for removal.   PCP: JEFFERY,CHELLE, PA-C   Patient Active Problem List   Diagnosis Date Noted  . Glaucoma 05/26/2013  . Gout 08/01/2012  . Obesity 08/01/2012  . Hypertension 07/20/2011  . Type II or unspecified type diabetes mellitus without mention of complication, uncontrolled 07/20/2011  . Hyperlipidemia 07/20/2011   Past Medical History  Diagnosis Date  . Obesity   . Lipoma of scalp   . Diabetes mellitus   . Hypertension   . Hyperlipidemia    Past Surgical History  Procedure Laterality Date  . Right arm orif     Allergies  Allergen Reactions  . Losartan Potassium-Hctz Hives   Prior to Admission medications   Medication Sig Start Date End Date Taking? Authorizing Provider  amLODipine (NORVASC) 10 MG tablet Take 1 tablet (10 mg total) by mouth daily. 01/08/13  Yes Chelle S Jeffery, PA-C  atenolol (TENORMIN) 50 MG tablet Take 1 tablet (50 mg total) by mouth daily. 01/08/13  Yes Chelle S Jeffery, PA-C  atenolol-chlorthalidone (TENORETIC) 50-25 MG per tablet Take 1 tablet by mouth daily. 01/08/13  Yes Chelle S Jeffery, PA-C  atorvastatin (LIPITOR) 40 MG tablet Take 1 tablet (40 mg total) by mouth daily. 01/08/13  Yes Chelle Janalee Dane, PA-C  B-D INS SYR ULTRAFINE 1CC/31G 31G X 5/16" 1 ML MISC USE AS DIRECTED ONCE A DAY WITH LANTUS   Yes Chelle S Jeffery, PA-C  clobetasol ointment (TEMOVATE) 0.05 % Apply topically 3 (three) times daily. 04/21/12  Yes Thao P Le, DO  COLCRYS 0.6 MG tablet TAKE  TWO TABLETS, THEN TAKE ONE MORE TABLET AN HOUR LATER.  3 TABLETS TOTAL PER GOUT FLARE 11/10/11  Yes Mancel Bale, PA-C  doxycycline (VIBRA-TABS) 100 MG tablet Take 1 tablet (100 mg total) by mouth 2 (two) times daily. 09/04/13  Yes Wendie Agreste, MD  glipiZIDE (GLUCOTROL) 10 MG tablet Take 1 tablet (10 mg total) by mouth 2 (two) times daily before a meal. 01/08/13  Yes Chelle S Jeffery, PA-C  indomethacin (INDOCIN) 50 MG capsule Take 1 capsule (50 mg total) by mouth 3 (three) times daily as needed. PATIENT NEEDS OFFICE VISIT FOR ADDITIONAL REFILLS   Yes Heather M Marte, PA-C  insulin glargine (LANTUS) 100 UNIT/ML injection Inject 0.92 mLs (92 Units total) into the skin at bedtime. 02/10/13  Yes Chelle S Jeffery, PA-C  Insulin Pen Needle 30G X 5 MM MISC 1 Units by Does not apply route daily. 01/08/13  Yes Chelle S Jeffery, PA-C  metFORMIN (GLUCOPHAGE) 1000 MG tablet Take 1 tablet (1,000 mg total) by mouth 2 (two) times daily with a meal. 01/08/13  Yes Chelle S Jeffery, PA-C  ranitidine (ZANTAC) 75 MG tablet Take 1 tablet (75 mg total) by mouth 2 (two) times daily. 01/08/13  Yes Fara Chute, PA-C   History   Social History  . Marital Status: Single    Spouse Name: n/a  Number of Children: 0  . Years of Education: 18   Occupational History  . [place] History Main Topics  . Smoking status: Former Smoker    Quit date: 09/05/1994  . Smokeless tobacco: Never Used  . Alcohol Use: No  . Drug Use: No  . Sexual Activity: No   Other Topics Concern  . Not on file   Social History Narrative   Master's Degree in Adult Education.  Lives alone.    Review of Systems  Constitutional: Negative for fever and chills.  Skin: Negative for rash.       Objective:   Physical Exam  Vitals reviewed. Constitutional: He is oriented to person, place, and time. He appears well-developed and well-nourished.  Obese.  HENT:  Head:    Pulmonary/Chest:  Effort normal.  Abdominal: Soft. Bowel sounds are normal. There is no tenderness.  Genitourinary:     Neurological: He is alert and oriented to person, place, and time.  Skin: No erythema.  Psychiatric: He has a normal mood and affect. His behavior is normal.    Filed Vitals:   09/07/13 1718  BP: 136/88  Pulse: 70  Temp: 98.1 F (36.7 C)  TempSrc: Oral  Resp: 16  Height: 5\' 11"  (1.803 m)  Weight: 388 lb 12.8 oz (176.359 kg)  SpO2: 99%       Assessment & Plan:  JOSPEH Houston is a 48 y.o. male Abscess of groin, left  - repacked, cont warm compress and recheck in 2 days. Cont doxycycline.   Lipoma of scalp - Plan: Ambulatory referral to General Surgery for eval for removal.   rtc precautions.    No orders of the defined types were placed in this encounter.   Patient Instructions  We are referring you to surgeon for evaluation of your scalp lipoma.   Keep warm compresses to wound and bandage changes - recheck Thursday with Dr. Carlota Raspberry.

## 2013-09-09 ENCOUNTER — Ambulatory Visit (INDEPENDENT_AMBULATORY_CARE_PROVIDER_SITE_OTHER): Payer: BC Managed Care – PPO | Admitting: Family Medicine

## 2013-09-09 VITALS — BP 130/82 | HR 76 | Temp 97.9°F | Resp 18 | Ht 72.0 in | Wt 394.6 lb

## 2013-09-09 DIAGNOSIS — L03319 Cellulitis of trunk, unspecified: Secondary | ICD-10-CM

## 2013-09-09 DIAGNOSIS — L02219 Cutaneous abscess of trunk, unspecified: Secondary | ICD-10-CM

## 2013-09-09 DIAGNOSIS — L02214 Cutaneous abscess of groin: Secondary | ICD-10-CM

## 2013-09-09 NOTE — Progress Notes (Addendum)
Subjective:    Patient ID: Justin Houston, male    DOB: March 08, 1966, 48 y.o.   MRN: [id number] This chart was scribed for [name], MD by Vernell Barrier, Medical Scribe. The patient was seen in room 12. This patient's care was started at 10:30 AM.  HPI HPI Comments: Justin Houston is a 48 y.o. male who presents to the [facility] for follow up. Last seen 5/26 by me for L groin abscess. See prior office visits. Wound culture that had a few white blood cells but no organisms; no growths. Repacked 2 days ago. Treated with Doxycycline. States he was given enough to last till yesterday and going to get the rest of his prescription today to continue taking. Reports no side effects from antibiotic. Denies fever or chills.   Patient Active Problem List   Diagnosis Date Noted  . Glaucoma 05/26/2013  . Gout 08/01/2012  . Obesity 08/01/2012  . Hypertension 07/20/2011  . Type II or unspecified type diabetes mellitus without mention of complication, uncontrolled 07/20/2011  . Hyperlipidemia 07/20/2011   Past Medical History  Diagnosis Date  . Obesity   . Lipoma of scalp   . Diabetes mellitus   . Hypertension   . Hyperlipidemia    Past Surgical History  Procedure Laterality Date  . Right arm orif     Allergies  Allergen Reactions  . Losartan Potassium-Hctz Hives   Prior to Admission medications   Medication Sig Start Date End Date Taking? Authorizing Provider  amLODipine (NORVASC) 10 MG tablet Take 1 tablet (10 mg total) by mouth daily. 01/08/13  Yes Chelle S Jeffery, PA-C  atenolol (TENORMIN) 50 MG tablet Take 1 tablet (50 mg total) by mouth daily. 01/08/13  Yes Chelle S Jeffery, PA-C  atenolol-chlorthalidone (TENORETIC) 50-25 MG per tablet Take 1 tablet by mouth daily. 01/08/13  Yes Chelle S Jeffery, PA-C  atorvastatin (LIPITOR) 40 MG tablet Take 1 tablet (40 mg total) by mouth daily. 01/08/13  Yes Chelle Janalee Dane, PA-C  B-D INS SYR ULTRAFINE 1CC/31G 31G X  5/16" 1 ML MISC USE AS DIRECTED ONCE A DAY WITH LANTUS   Yes Chelle S Jeffery, PA-C  clobetasol ointment (TEMOVATE) 0.05 % Apply topically 3 (three) times daily. 04/21/12  Yes Thao P Le, DO  COLCRYS 0.6 MG tablet TAKE TWO TABLETS, THEN TAKE ONE MORE TABLET AN HOUR LATER.  3 TABLETS TOTAL PER GOUT FLARE 11/10/11  Yes Mancel Bale, PA-C  doxycycline (VIBRA-TABS) 100 MG tablet Take 1 tablet (100 mg total) by mouth 2 (two) times daily. 09/04/13  Yes Wendie Agreste, MD  glipiZIDE (GLUCOTROL) 10 MG tablet Take 1 tablet (10 mg total) by mouth 2 (two) times daily before a meal. 01/08/13  Yes Chelle S Jeffery, PA-C  indomethacin (INDOCIN) 50 MG capsule Take 1 capsule (50 mg total) by mouth 3 (three) times daily as needed. PATIENT NEEDS OFFICE VISIT FOR ADDITIONAL REFILLS   Yes Heather M Marte, PA-C  insulin glargine (LANTUS) 100 UNIT/ML injection Inject 0.92 mLs (92 Units total) into the skin at bedtime. 02/10/13  Yes Chelle S Jeffery, PA-C  Insulin Pen Needle 30G X 5 MM MISC 1 Units by Does not apply route daily. 01/08/13  Yes Chelle S Jeffery, PA-C  metFORMIN (GLUCOPHAGE) 1000 MG tablet Take 1 tablet (1,000 mg total) by mouth 2 (two) times daily with a meal. 01/08/13  Yes Chelle S Jeffery, PA-C  ranitidine (ZANTAC) 75 MG tablet Take 1 tablet (75 mg total)  by mouth 2 (two) times daily. 01/08/13  Yes Fara Chute, PA-C   History   Social History  . Marital Status: Single    Spouse Name: n/a    Number of Children: 0  . Years of Education: 26   Occupational History  . [place] History Main Topics  . Smoking status: Former Smoker    Quit date: 09/05/1994  . Smokeless tobacco: Never Used  . Alcohol Use: No  . Drug Use: No  . Sexual Activity: No   Other Topics Concern  . Not on file   Social History Narrative   Master's Degree in Adult Education.  Lives alone.   Review of Systems  Constitutional: Negative for fever and chills.   Objective:    Physical Exam  Nursing note and vitals reviewed. Constitutional: He is oriented to person, place, and time. He appears well-developed and well-nourished. No distress.  HENT:  Head: Normocephalic and atraumatic.  Eyes: EOM are normal.  Neck: Neck supple.  Cardiovascular: Normal rate.   Pulmonary/Chest: Effort normal. No respiratory distress.  Musculoskeletal: Normal range of motion.  Lymphadenopathy:    He has no cervical adenopathy.  Neurological: He is alert and oriented to person, place, and time.  Skin: Skin is warm and dry.  Packing in place in left groin abscess with induration approximately 1 cm superior to packing and 0.5 cm inferior to packing. No fluctuance. Packing removed approximately 2 cm worth. No exudate expressed from opening. Base visualized.   Psychiatric: He has a normal mood and affect. His behavior is normal.   Filed Vitals:   09/09/13 0901  BP: 130/82  Pulse: 76  Temp: 97.9 F (36.6 C)  TempSrc: Oral  Resp: 18  Height: 6' (1.829 m)  Weight: 394 lb 9.6 oz (178.989 kg)  SpO2: 96%   Assessment & Plan:  Justin Houston is a 48 y.o. male Abscess of groin, left Healing.  Base visualized, no further packing at this point, wound care discussed to allow to heal in from below.  rtc precautions.  Bandage placed.   No orders of the defined types were placed in this encounter.   Patient Instructions  Packing removed today. Continue warm compresses, and gently keep surface of wound open until closes up from below. Return to the clinic or go to the nearest emergency room if any of your symptoms worsen or new symptoms occur.     I personally performed the services described in this documentation, which was scribed in my presence. The recorded information has been reviewed and considered, and addended by me as needed.

## 2013-09-09 NOTE — Patient Instructions (Signed)
Packing removed today. Continue warm compresses, and gently keep surface of wound open until closes up from below. Return to the clinic or go to the nearest emergency room if any of your symptoms worsen or new symptoms occur.

## 2013-09-20 ENCOUNTER — Ambulatory Visit (INDEPENDENT_AMBULATORY_CARE_PROVIDER_SITE_OTHER): Payer: BC Managed Care – PPO | Admitting: Surgery

## 2013-09-29 ENCOUNTER — Encounter (INDEPENDENT_AMBULATORY_CARE_PROVIDER_SITE_OTHER): Payer: Self-pay | Admitting: Surgery

## 2013-09-29 ENCOUNTER — Ambulatory Visit (INDEPENDENT_AMBULATORY_CARE_PROVIDER_SITE_OTHER): Payer: BC Managed Care – PPO | Admitting: Surgery

## 2013-09-29 DIAGNOSIS — R221 Localized swelling, mass and lump, neck: Secondary | ICD-10-CM

## 2013-09-29 DIAGNOSIS — K429 Umbilical hernia without obstruction or gangrene: Secondary | ICD-10-CM | POA: Insufficient documentation

## 2013-09-29 DIAGNOSIS — R220 Localized swelling, mass and lump, head: Secondary | ICD-10-CM

## 2013-09-29 DIAGNOSIS — K420 Umbilical hernia with obstruction, without gangrene: Secondary | ICD-10-CM

## 2013-09-29 HISTORY — DX: Localized swelling, mass and lump, head: R22.0

## 2013-09-29 NOTE — Progress Notes (Signed)
Subjective:     Patient ID: Justin Houston, male   DOB: [birth year], 48 y.o.   MRN: [id number]  HPI  Note: Portions of this report may have been transcribed using voice recognition software. Every effort was made to ensure accuracy; however, inadvertent computerized transcription errors may be present.   Any transcriptional errors that result from this process are unintentional.            Justin Houston  [birth year] [id number]  Patient Care Team: Fara Chute, PA-C as PCP - General (Physician Assistant) Wendie Agreste, MD as Attending Physician (Family Medicine)  This patient is a 48 y.o.male who presents today for surgical evaluation at the request of Dr. Carlota Raspberry.   Reason for visit: Enlarging mass on right head.  Request for removal.  Pleasant super morbidly obese male with Mass behind right ear on his head for several years.  It has gotten larger more recently.  Concerned him.  Discussed with his primary care physician.  Felt to be probably a lipoma.  Surgical consultation requested for removal.  Had a recent left groin infection.  Responded to doxycycline.  Denies any other skin infections.  No MRSA.  He used to smoke tobacco but has been abstinent for years.  Can walk 20 minutes without difficulty.  He is an insulin-requiring diabetic proclaims his sugars have been running fine.  No history heart attack or stroke.  No history of other masses or lipomas.  Patient Active Problem List   Diagnosis Date Noted  . Glaucoma 05/26/2013  . Gout 08/01/2012  . Obesity 08/01/2012  . Hypertension 07/20/2011  . Type II or unspecified type diabetes mellitus without mention of complication, uncontrolled 07/20/2011  . Hyperlipidemia 07/20/2011    Past Medical History  Diagnosis Date  . Obesity   . Lipoma of scalp   . Diabetes mellitus   . Hypertension   . Hyperlipidemia     Past Surgical History  Procedure Laterality Date  . Right arm orif      History   Social History    . Marital Status: Single    Spouse Name: n/a    Number of Children: 0  . Years of Education: 70   Occupational History  . [place] History Main Topics  . Smoking status: Former Smoker    Quit date: 09/05/1994  . Smokeless tobacco: Never Used  . Alcohol Use: No  . Drug Use: No  . Sexual Activity: No   Other Topics Concern  . Not on file   Social History Narrative   Master's Degree in Adult Education.  Lives alone.    Family History  Problem Relation Age of Onset  . Stroke Mother   . Hypertension Mother     Current Outpatient Prescriptions  Medication Sig Dispense Refill  . amLODipine (NORVASC) 10 MG tablet Take 1 tablet (10 mg total) by mouth daily.  90 tablet  3  . atenolol (TENORMIN) 50 MG tablet Take 1 tablet (50 mg total) by mouth daily.  90 tablet  3  . atenolol-chlorthalidone (TENORETIC) 50-25 MG per tablet Take 1 tablet by mouth daily.  90 tablet  3  . atorvastatin (LIPITOR) 40 MG tablet Take 1 tablet (40 mg total) by mouth daily.  90 tablet  3  . B-D INS SYR ULTRAFINE 1CC/31G 31G X 5/16" 1 ML MISC USE AS DIRECTED ONCE A DAY WITH LANTUS  100 each  1  . clobetasol ointment (TEMOVATE)  0.05 % Apply topically 3 (three) times daily.  60 g  1  . COLCRYS 0.6 MG tablet TAKE TWO TABLETS, THEN TAKE ONE MORE TABLET AN HOUR LATER.  3 TABLETS TOTAL PER GOUT FLARE  30 tablet  1  . doxycycline (VIBRA-TABS) 100 MG tablet Take 1 tablet (100 mg total) by mouth 2 (two) times daily.  20 tablet  0  . glipiZIDE (GLUCOTROL) 10 MG tablet Take 1 tablet (10 mg total) by mouth 2 (two) times daily before a meal.  180 tablet  3  . indomethacin (INDOCIN) 50 MG capsule Take 1 capsule (50 mg total) by mouth 3 (three) times daily as needed. PATIENT NEEDS OFFICE VISIT FOR ADDITIONAL REFILLS  30 capsule  0  . insulin glargine (LANTUS) 100 UNIT/ML injection Inject 0.92 mLs (92 Units total) into the skin at bedtime.  30 mL  12  . Insulin Pen Needle 30G X 5 MM  MISC 1 Units by Does not apply route daily.  100 each  4  . metFORMIN (GLUCOPHAGE) 1000 MG tablet Take 1 tablet (1,000 mg total) by mouth 2 (two) times daily with a meal.  180 tablet  3  . ranitidine (ZANTAC) 75 MG tablet Take 1 tablet (75 mg total) by mouth 2 (two) times daily.  180 tablet  3   No current facility-administered medications for this visit.     Allergies  Allergen Reactions  . Losartan Potassium-Hctz Hives    BP 136/80  Pulse 77  Temp(Src) 97.6 F (36.4 C)  Ht 6' (1.829 m)  Wt 392 lb 12.8 oz (178.173 kg)  BMI 53.26 kg/m2  No results found.   Review of Systems  Constitutional: Negative for fever, chills and diaphoresis.  HENT: Negative for ear discharge, facial swelling, mouth sores, nosebleeds, sore throat and trouble swallowing.   Eyes: Negative for photophobia, discharge and visual disturbance.  Respiratory: Negative for choking, chest tightness, shortness of breath and stridor.   Cardiovascular: Negative for chest pain and palpitations.  Gastrointestinal: Negative for nausea, vomiting, abdominal pain, diarrhea, constipation, blood in stool, abdominal distention, anal bleeding and rectal pain.  Endocrine: Negative for cold intolerance and heat intolerance.  Genitourinary: Negative for dysuria, urgency, difficulty urinating and testicular pain.  Musculoskeletal: Negative for arthralgias, back pain, gait problem and myalgias.  Skin: Negative for color change, pallor, rash and wound.  Allergic/Immunologic: Negative for environmental allergies and food allergies.  Neurological: Negative for dizziness, speech difficulty, weakness, numbness and headaches.  Hematological: Negative for adenopathy. Does not bruise/bleed easily.  Psychiatric/Behavioral: Negative for hallucinations, confusion and agitation.       Objective:   Physical Exam  Constitutional: He is oriented to person, place, and time. He appears well-developed and well-nourished. No distress.  HENT:    Head: Normocephalic.    Mouth/Throat: Oropharynx is clear and moist. No oropharyngeal exudate.  Eyes: Conjunctivae and EOM are normal. Pupils are equal, round, and reactive to light. No scleral icterus.  Neck: Normal range of motion. Neck supple. No tracheal deviation present.  Cardiovascular: Normal rate, regular rhythm and intact distal pulses.   Pulmonary/Chest: Effort normal and breath sounds normal. No respiratory distress.  Abdominal: Soft. He exhibits no distension. There is no tenderness. A hernia is present. Hernia confirmed positive in the ventral area. Hernia confirmed negative in the right inguinal area and confirmed negative in the left inguinal area.    Musculoskeletal: Normal range of motion. He exhibits no tenderness.  Lymphadenopathy:    He has no cervical adenopathy.  Right: No inguinal adenopathy present.       Left: No inguinal adenopathy present.  Neurological: He is alert and oriented to person, place, and time. No cranial nerve deficit. He exhibits normal muscle tone. Coordination normal.  Skin: Skin is warm and dry. No rash noted. He is not diaphoretic. No erythema. No pallor.  Psychiatric: He has a normal mood and affect. His behavior is normal. Judgment and thought content normal.       Assessment:     Enlarging mass on right side of neck.  Probable lipoma.  Pediculated umbilical mass consistent with a chronically incarcerated umbilical hernia.  Asymptomatic.     Plan:     I think he would benefit from surgery to remove the mass on his scalp.  It is larger.  He is interested in proceeding.  Possible need for drain given the volume of the mass.  Plan decubitus position right side up.  Super morbidly obese but no definite sleep apnea:  The pathophysiology of skin & subcutaneous masses was discussed.  Natural history risks without surgery were discussed.  I recommended surgery to remove the mass.  I explained the technique of removal with use of local  anesthesia & possible need for more aggressive sedation/anesthesia for patient comfort.    Risks such as bleeding, infection, heart attack, death, and other risks were discussed.  I noted a good likelihood this will help address the problem.   Possibility that this will not correct all symptoms was explained. Possibility of regrowth/recurrence of the mass was discussed.  We will work to minimize complications. Questions were answered.  The patient expresses understanding & wishes to proceed with surgery.   I think at some point he could benefit from reduction/removal and repair of his umbilical hernia.  I would do laparoscopic approach with mesh.  He would like to hold off as he does not have symptoms at this time:  The anatomy & physiology of the abdominal wall was discussed.  The pathophysiology of hernias was discussed.  Natural history risks without surgery including progeressive enlargement, pain, incarceration & strangulation was discussed.   Contributors to complications such as smoking, obesity, diabetes, prior surgery, etc were discussed.   I feel the risks of no intervention will lead to serious problems that outweigh the operative risks; therefore, I recommended surgery to reduce and repair the hernia.  I explained laparoscopic techniques with possible need for an open approach.  I noted the probable use of mesh to patch and/or buttress the hernia repair  Risks such as bleeding, infection, abscess, need for further treatment, heart attack, death, and other risks were discussed.  I noted a good likelihood this will help address the problem.   Goals of post-operative recovery were discussed as well.  Possibility that this will not correct all symptoms was explained.  I stressed the importance of low-impact activity, aggressive pain control, avoiding constipation, & not pushing through pain to minimize risk of post-operative chronic pain or injury. Possibility of reherniation especially with  smoking, obesity, diabetes, immunosuppression, and other health conditions was discussed.  We will work to minimize complications.     An educational handout further explaining the pathology & treatment options was given as well.  Questions were answered.  The patient expresses understanding & wishes to hold off on surgery.

## 2013-09-29 NOTE — Patient Instructions (Signed)
Please consider the recommendations that we have given you today:  Consider surgery to remove the mass on the side of your neck behind your right ear.  Probably a lipoma.  Consider surgery to reduce and repair the hernia at your bellybutton.  See the Handout(s) we have given you.  Please call our office at [phone] if you wish to schedule surgery or if you have further questions / concerns.  Lipoma A lipoma is a noncancerous (benign) tumor composed of fat cells. They are usually found under the skin (subcutaneous). A lipoma may occur in any tissue of the body that contains fat. Common areas for lipomas to appear include the back, shoulders, buttocks, and thighs. Lipomas are a very common soft tissue growth. They are soft and grow slowly. Most problems caused by a lipoma depend on where it is growing. DIAGNOSIS  A lipoma can be diagnosed with a physical exam. These tumors rarely become cancerous, but radiographic studies can help determine this for certain. Studies used may include:  Computerized X-ray scans (CT or CAT scan).  Computerized magnetic scans (MRI). TREATMENT  Small lipomas that are not causing problems may be watched. If a lipoma continues to enlarge or causes problems, removal is often the best treatment. Lipomas can also be removed to improve appearance. Surgery is done to remove the fatty cells and the surrounding capsule. Most often, this is done with medicine that numbs the area (local anesthetic). The removed tissue is examined under a microscope to make sure it is not cancerous. Keep all follow-up appointments with your caregiver. SEEK MEDICAL CARE IF:   The lipoma becomes larger or hard.  The lipoma becomes painful, red, or increasingly swollen. These could be signs of infection or a more serious condition. Document Released: 03/22/2002 Document Revised: 06/24/2011 Document Reviewed: 09/01/2009 [facility] Patient Information 2015 [place], [place]. This information is  not intended to replace advice given to you by your health care provider. Make sure you discuss any questions you have with your health care provider.   GENERAL SURGERY: POST OP INSTRUCTIONS  1. DIET: Follow a light bland diet the first 24 hours after arrival home, such as soup, liquids, crackers, etc.  Be sure to include lots of fluids daily.  Avoid fast food or heavy meals as your are more likely to get nauseated.   2. Take your usually prescribed home medications unless otherwise directed. 3. PAIN CONTROL: a. Pain is best controlled by a usual combination of three different methods TOGETHER: i. Ice/Heat ii. Over the counter pain medication iii. Prescription pain medication b. Most patients will experience some swelling and bruising around the incisions.  Ice packs or heating pads (30-60 minutes up to 6 times a day) will help. Use ice for the first few days to help decrease swelling and bruising, then switch to heat to help relax tight/sore spots and speed recovery.  Some people prefer to use ice alone, heat alone, alternating between ice & heat.  Experiment to what works for you.  Swelling and bruising can take several weeks to resolve.   c. It is helpful to take an over-the-counter pain medication regularly for the first few weeks.  Choose one of the following that works best for you: i. Naproxen (Aleve, etc)  Two 220mg  tabs twice a day ii. Ibuprofen (Advil, etc) Three 200mg  tabs four times a day (every meal & bedtime) iii. Acetaminophen (Tylenol, etc) 500-650mg  four times a day (every meal & bedtime) d. A  prescription for pain medication (  such as oxycodone, hydrocodone, etc) should be given to you upon discharge.  Take your pain medication as prescribed.  i. If you are having problems/concerns with the prescription medicine (does not control pain, nausea, vomiting, rash, itching, etc), please call us [phone] to see if we need to switch you to a different pain medicine that will work  better for you and/or control your side effect better. ii. If you need a refill on your pain medication, please contact your pharmacy.  They will contact our office to request authorization. Prescriptions will not be filled after 5 pm or on week-ends. 4. Avoid getting constipated.  Between the surgery and the pain medications, it is common to experience some constipation.  Increasing fluid intake and taking a fiber supplement (such as Metamucil, Citrucel, FiberCon, MiraLax, etc) 1-2 times a day regularly will usually help prevent this problem from occurring.  A mild laxative (prune juice, Milk of Magnesia, MiraLax, etc) should be taken according to package directions if there are no bowel movements after 48 hours.   5. Wash / shower every day.  You may shower over the dressings as they are waterproof.  Continue to shower over incision(s) after the dressing is off. 6. Remove your waterproof bandages 5 days after surgery.  You may leave the incision open to air.  You may have skin tapes (Steri Strips) covering the incision(s).  Leave them on until one week, then remove.  You may replace a dressing/Band-Aid to cover the incision for comfort if you wish.      7. ACTIVITIES as tolerated:   a. You may resume regular (light) daily activities beginning the next day-such as daily self-care, walking, climbing stairs-gradually increasing activities as tolerated.  If you can walk 30 minutes without difficulty, it is safe to try more intense activity such as jogging, treadmill, bicycling, low-impact aerobics, swimming, etc. b. Save the most intensive and strenuous activity for last such as sit-ups, heavy lifting, contact sports, etc  Refrain from any heavy lifting or straining until you are off narcotics for pain control.   c. DO NOT PUSH THROUGH PAIN.  Let pain be your guide: If it hurts to do something, don't do it.  Pain is your body warning you to avoid that activity for another week until the pain goes  down. d. You may drive when you are no longer taking prescription pain medication, you can comfortably wear a seatbelt, and you can safely maneuver your car and apply brakes. e. Dennis Bast may have sexual intercourse when it is comfortable.  8. FOLLOW UP in our office a. Please call [facility] at [phone] to set up an appointment to see your surgeon in the office for a follow-up appointment approximately 2-3 weeks after your surgery. b. Make sure that you call for this appointment the day you arrive home to insure a convenient appointment time. 9. IF YOU HAVE DISABILITY OR FAMILY LEAVE FORMS, BRING THEM TO THE OFFICE FOR PROCESSING.  DO NOT GIVE THEM TO YOUR DOCTOR.   WHEN TO CALL us [phone]: 1. Poor pain control 2. Reactions / problems with new medications (rash/itching, nausea, etc)  3. Fever over 101.5 F (38.5 C) 4. Worsening swelling or bruising 5. Continued bleeding from incision. 6. Increased pain, redness, or drainage from the incision 7. Difficulty breathing / swallowing   The clinic staff is available to answer your questions during regular business hours (8:30am-5pm).  Please don't hesitate to call and ask to speak to one of  our nurses for clinical concerns.   If you have a medical emergency, go to the nearest emergency room or call 911.  A surgeon from [facility] is always on call at the [facility], [place], [place], [place], [place]  [address] ? MAIN: [phone] ? TOLL FREE: [phone] ?  FAX [phone] [email or web address]  Hernia A hernia occurs when an internal organ pushes out through a weak spot in the abdominal wall. Hernias most commonly occur in the groin and around the navel. Hernias often can be pushed back into place (reduced). Most hernias tend to get worse over time. Some abdominal hernias can get stuck in the opening (irreducible or incarcerated hernia) and cannot be reduced. An  irreducible abdominal hernia which is tightly squeezed into the opening is at risk for impaired blood supply (strangulated hernia). A strangulated hernia is a medical emergency. Because of the risk for an irreducible or strangulated hernia, surgery may be recommended to repair a hernia. CAUSES   Heavy lifting.  Prolonged coughing.  Straining to have a bowel movement.  A cut (incision) made during an abdominal surgery. HOME CARE INSTRUCTIONS   Bed rest is not required. You may continue your normal activities.  Avoid lifting more than 10 pounds (4.5 kg) or straining.  Cough gently. If you are a smoker it is best to stop. Even the best hernia repair can break down with the continual strain of coughing. Even if you do not have your hernia repaired, a cough will continue to aggravate the problem.  Do not wear anything tight over your hernia. Do not try to keep it in with an outside bandage or truss. These can damage abdominal contents if they are trapped within the hernia sac.  Eat a normal diet.  Avoid constipation. Straining over long periods of time will increase hernia size and encourage breakdown of repairs. If you cannot do this with diet alone, stool softeners may be used. SEEK IMMEDIATE MEDICAL CARE IF:   You have a fever.  You develop increasing abdominal pain.  You feel nauseous or vomit.  Your hernia is stuck outside the abdomen, looks discolored, feels hard, or is tender.  You have any changes in your bowel habits or in the hernia that are unusual for you.  You have increased pain or swelling around the hernia.  You cannot push the hernia back in place by applying gentle pressure while lying down. MAKE SURE YOU:   Understand these instructions.  Will watch your condition.  Will get help right away if you are not doing well or get worse. Document Released: 04/01/2005 Document Revised: 06/24/2011 Document Reviewed: 11/19/2007 [facility] Patient Information 2015  [place], [place]. This information is not intended to replace advice given to you by your health care provider. Make sure you discuss any questions you have with your health care provider.

## 2013-10-09 ENCOUNTER — Other Ambulatory Visit: Payer: Self-pay | Admitting: Physician Assistant

## 2013-10-13 ENCOUNTER — Ambulatory Visit: Payer: BC Managed Care – PPO | Admitting: Podiatry

## 2013-10-29 ENCOUNTER — Ambulatory Visit (INDEPENDENT_AMBULATORY_CARE_PROVIDER_SITE_OTHER): Payer: BC Managed Care – PPO | Admitting: Family Medicine

## 2013-10-29 VITALS — BP 136/86 | HR 65 | Temp 97.8°F | Resp 18 | Ht <= 58 in | Wt 381.4 lb

## 2013-10-29 DIAGNOSIS — IMO0001 Reserved for inherently not codable concepts without codable children: Secondary | ICD-10-CM

## 2013-10-29 DIAGNOSIS — I10 Essential (primary) hypertension: Secondary | ICD-10-CM

## 2013-10-29 DIAGNOSIS — IMO0002 Reserved for concepts with insufficient information to code with codable children: Secondary | ICD-10-CM

## 2013-10-29 DIAGNOSIS — E1165 Type 2 diabetes mellitus with hyperglycemia: Secondary | ICD-10-CM

## 2013-10-29 DIAGNOSIS — L02219 Cutaneous abscess of trunk, unspecified: Secondary | ICD-10-CM

## 2013-10-29 DIAGNOSIS — E669 Obesity, unspecified: Secondary | ICD-10-CM

## 2013-10-29 DIAGNOSIS — L03319 Cellulitis of trunk, unspecified: Secondary | ICD-10-CM

## 2013-10-29 DIAGNOSIS — L02214 Cutaneous abscess of groin: Secondary | ICD-10-CM

## 2013-10-29 LAB — GLUCOSE, POCT (MANUAL RESULT ENTRY): POC GLUCOSE: 178 mg/dL — AB (ref 70–99)

## 2013-10-29 MED ORDER — CEPHALEXIN 500 MG PO CAPS: 500.0000 mg | ORAL_CAPSULE | Freq: Four times a day (QID) | ORAL | Status: DC

## 2013-10-29 NOTE — Progress Notes (Signed)
   Subjective:    Patient ID: Justin Houston, male    DOB: [birth year], 48 y.o.   MRN: [id number]  HPI This is a very pleasant, morbidly obese male who presents today for follow up of his diabetes and left groin abscess. Has been watching diet and increasing exercise and has lost 11 pounds since last month. He admits to not being very good about checking his blood sugar. He was referred for diabetic nutrition counseling and did not keep his appointment, but he would like to be referred again as he is feeling more motivated.   He had an I&D of his left groin abscess 5/15. He completed his antibiotics and had resolution of his abscess. He noticed return of swelling in same location yesterday with some drainage. Feels firm.   Review of Systems No fever, no CP, no SOB.     Objective:   Physical Exam  Vitals reviewed. Constitutional: He is oriented to person, place, and time. He appears well-developed and well-nourished.  HENT:  Patient has large lipoma above right ear.   Eyes: Conjunctivae are normal.  Neck: Normal range of motion.  Cardiovascular: Normal rate and regular rhythm.   Pulmonary/Chest: Effort normal and breath sounds normal.  Musculoskeletal: Normal range of motion.  Neurological: He is alert and oriented to person, place, and time.  Skin: Skin is warm and dry.  Patient with multiple, large lower extremity varicose veins. Left groin with approx. 1.5 cm area firmness, pore seen, no drainage currently. Well defined.  Psychiatric: He has a normal mood and affect. His behavior is normal. Judgment and thought content normal.   Results for orders placed in visit on 10/29/13  GLUCOSE, POCT (MANUAL RESULT ENTRY)      Result Value Ref Range   POC Glucose 178 (*) 70 - 99 mg/dl   Patient had not had am insulin    Assessment & Plan:  Discussed with Dr. Joseph Art who examined the patient.  1. DM (diabetes mellitus), type 2, uncontrolled - POCT glucose (manual entry) -  Ambulatory referral to diabetic education -Encouraged patient to check blood sugar at home once a week and record results to bring in to his next office visit.  2. Abscess of groin -this is recurrent, currently small, will treat with antibiotic and warm compresses, follow up 7-10 days after finishing antibiotic - cephALEXin (KEFLEX) 500 MG capsule; Take 1 capsule (500 mg total) by mouth 4 (four) times daily.  Dispense: 40 capsule; Refill: 0  3. Essential hypertension -continue current meds  4. Obesity -encouraged continued weight loss -provided information for support stockings- patient can bring to next visit if he needs instruction for applying.   Elby Beck, FNP-BC  [facility]  10/29/2013 3:03 PM

## 2013-10-29 NOTE — Patient Instructions (Signed)
Compression stocking- [facility] [phone] Ask for 20-30 compression strength Size- large Open toe You can bring them in to your follow up visit and we can show you how to put them on.  Please apply warm compresses to your groin 3 times a day  Keep up the good work with your diet!  Please check your blood sugar daily and bring in the readings.   Abscess An abscess is an infected area that contains a collection of pus and debris.It can occur in almost any part of the body. An abscess is also known as a furuncle or boil. CAUSES  An abscess occurs when tissue gets infected. This can occur from blockage of oil or sweat glands, infection of hair follicles, or a minor injury to the skin. As the body tries to fight the infection, pus collects in the area and creates pressure under the skin. This pressure causes pain. People with weakened immune systems have difficulty fighting infections and get certain abscesses more often.  SYMPTOMS Usually an abscess develops on the skin and becomes a painful mass that is red, warm, and tender. If the abscess forms under the skin, you may feel a moveable soft area under the skin. Some abscesses break open (rupture) on their own, but most will continue to get worse without care. The infection can spread deeper into the body and eventually into the bloodstream, causing you to feel ill.  DIAGNOSIS  Your caregiver will take your medical history and perform a physical exam. A sample of fluid may also be taken from the abscess to determine what is causing your infection. TREATMENT  Your caregiver may prescribe antibiotic medicines to fight the infection. However, taking antibiotics alone usually does not cure an abscess. Your caregiver may need to make a small cut (incision) in the abscess to drain the pus. In some cases, gauze is packed into the abscess to reduce pain and to continue draining the area. HOME CARE INSTRUCTIONS   Only take over-the-counter or  prescription medicines for pain, discomfort, or fever as directed by your caregiver.  If you were prescribed antibiotics, take them as directed. Finish them even if you start to feel better.  If gauze is used, follow your caregiver's directions for changing the gauze.  To avoid spreading the infection:  Keep your draining abscess covered with a bandage.  Wash your hands well.  Do not share personal care items, towels, or whirlpools with others.  Avoid skin contact with others.  Keep your skin and clothes clean around the abscess.  Keep all follow-up appointments as directed by your caregiver. SEEK MEDICAL CARE IF:   You have increased pain, swelling, redness, fluid drainage, or bleeding.  You have muscle aches, chills, or a general ill feeling.  You have a fever. MAKE SURE YOU:   Understand these instructions.  Will watch your condition.  Will get help right away if you are not doing well or get worse. Document Released: 01/09/2005 Document Revised: 10/01/2011 Document Reviewed: 06/14/2011 [facility] Patient Information 2015 [place], [place]. This information is not intended to replace advice given to you by your health care provider. Make sure you discuss any questions you have with your health care provider.

## 2013-11-16 ENCOUNTER — Encounter: Payer: BC Managed Care – PPO | Attending: Family Medicine | Admitting: *Deleted

## 2013-11-16 ENCOUNTER — Encounter: Payer: Self-pay | Admitting: *Deleted

## 2013-11-16 VITALS — Ht 72.0 in | Wt 381.3 lb

## 2013-11-16 DIAGNOSIS — Z713 Dietary counseling and surveillance: Secondary | ICD-10-CM | POA: Insufficient documentation

## 2013-11-16 DIAGNOSIS — Z794 Long term (current) use of insulin: Secondary | ICD-10-CM | POA: Diagnosis not present

## 2013-11-16 DIAGNOSIS — E119 Type 2 diabetes mellitus without complications: Secondary | ICD-10-CM | POA: Diagnosis present

## 2013-11-16 NOTE — Progress Notes (Signed)
Appt start time: 1430 end time:  1600.  Assessment:  Patient was seen on  11/16/13 for individual diabetes education. 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 Lives alone. Patient request visit here with primary focus: eat healthy, lose weight, long life. Only cooks on sundays. He presently is not testing his glucose.  Patient Education Plan per assessed needs and concerns is to attend individual session for Diabetes Self Management Education.  Current HbA1c: 7.7%  Down from 11.1% a yr ago  Preferred Learning Style:   No preference indicated   Learning Readiness:   Not ready  Contemplating  Ready  Change in progress  MEDICATIONS: See List:  Lantus, Glipizide, Metformin  DIETARY INTAKE: likes to snack on chips and cookies, regular soft drink / drive thru between jobs, craves [place] food, [place] for breakfast  Avoided foods include Red meat, pork.    B (10 AM): eggs, [place] bacon/sausage, coffee, splenda, creamer....... 2 packs instant grits,2 packs instant oatmeal, honey oats cereal  Snk ( AM): oreo cookie X5, water L (1 PM): sandwich, [place], wheat bread, mayonnaise, chips Snk ( PM): bag of chips D (6 PM): sandwich, chick-fil-a (Sunday bake chicken thighs no skin/fat) Snk ( PM): coconut custard pie Beverages: water, diet mountain dew, sweet tea / sugar  Usual physical activity: walk 2-3 times per week 20 minutes   Intervention:  Nutrition counseling provided.  Discussed diabetes disease process and treatment options.  Discussed physiology of diabetes and role of obesity on insulin resistance.  Encouraged moderate weight reduction to improve glucose levels.  Discussed role of medications and diet in glucose control  Provided education on macronutrients on glucose levels.  Provided education on carb counting, importance of regularly scheduled meals/snacks, and meal planning  Discussed effects of physical activity  on glucose levels and long-term glucose control.  Recommended 150 minutes of physical activity/week.  Reviewed patient medications.  Discussed role of medication on blood glucose and possible side effects  Discussed blood glucose monitoring and interpretation.  Discussed recommended target ranges and individual ranges.    Described short-term complications: hyper- and hypo-glycemia.  Discussed causes,symptoms, and treatment options.  Discussed prevention, detection, and treatment of long-term complications.  Discussed the role of prolonged elevated glucose levels on body systems.  Discussed role of stress on blood glucose levels and discussed strategies to manage psychosocial issues.  Discussed recommendations for long-term diabetes self-care.  Established checklist for medical, dental, and emotional self-care.  Teaching Method Utilized:  Visual Auditory Hands on  Handouts given during visit include: Living Well with Diabetes Carb Counting and Food Label handouts Meal Plan Card Snack sheet A1c conversion sheet My plate  Support Group  Plan:  Aim for 3-4 Carb Choices per meal (45-60 grams) +/- 1 either way  Aim for 0-15 Carbs per snack if hungry  Include protein in moderation with your meals and snacks Consider reading food labels for Total Carbohydrate and Fat Grams of foods Consider  increasing your activity level by walking for 30 minutes daily as tolerated Consider checking BG at alternate times per day as directed by MD  Continue taking medication as directed by MD  Good food snacks: Light & Fit Greek Yogurt [employer] (not granola bars) Cheese Nuts Fruit  Buy unsweet tea and put Splenda in it at home 1-1 1/2C Rice - 1C full for a meal Lunch:    Sandwich plus baggie of chips Snack: protein [employer]:  Grilled  chicken sandwich Deluxe, waffle fries (take only eat half the bun, or half the waffle fries) [employer]: Double cheese burger, small fry,  unsweet tea add splenda Decrease amount of fruit juice to 1C / 8oz. Test glucose at least 2X weekly, FBS and 2hpp  Barriers to learning/adherence- to lifestyle change: staying focused  Diabetes self-care support plan:   [facility] support group  Demonstrated degree of understanding via:  Teach Back   Monitoring/Evaluation:  Dietary intake, exercise, test glucose, and body weight follow up in about 2 months. Call for an appointment with Izora Gala

## 2013-11-16 NOTE — Patient Instructions (Addendum)
Plan:  Aim for 3-4 Carb Choices per meal (45-60 grams) +/- 1 either way  Aim for 0-15 Carbs per snack if hungry  Include protein in moderation with your meals and snacks Consider reading food labels for Total Carbohydrate and Fat Grams of foods Consider  increasing your activity level by walking for 30 minutes daily as tolerated Consider checking BG at alternate times per day as directed by MD  Continue taking medication as directed by MD  Good food snacks: Light & Fit Greek Yogurt [employer] (not granola bars) Cheese Nuts Fruit  Buy unsweet tea and put Splenda in it at home 1-1 1/2C Rice - 1C full for a meal Lunch:    Sandwich plus baggie of chips Snack: protein Bar Dinner:  Grilled chicken sandwich Delux, waffle fries [employer]: Double cheese burger, small fry, unsweet tea add splenda Decrease amount of fruit juice to 1C / 8oz.

## 2013-11-24 ENCOUNTER — Ambulatory Visit: Payer: Self-pay | Admitting: *Deleted

## 2013-11-26 ENCOUNTER — Encounter: Payer: Self-pay | Admitting: Podiatry

## 2013-11-26 ENCOUNTER — Ambulatory Visit (INDEPENDENT_AMBULATORY_CARE_PROVIDER_SITE_OTHER): Payer: BC Managed Care – PPO | Admitting: Podiatry

## 2013-11-26 VITALS — BP 150/89 | HR 76 | Resp 18

## 2013-11-26 DIAGNOSIS — Q828 Other specified congenital malformations of skin: Secondary | ICD-10-CM

## 2013-11-26 DIAGNOSIS — M79609 Pain in unspecified limb: Secondary | ICD-10-CM

## 2013-11-26 DIAGNOSIS — M79676 Pain in unspecified toe(s): Secondary | ICD-10-CM

## 2013-11-26 DIAGNOSIS — B351 Tinea unguium: Secondary | ICD-10-CM

## 2013-11-26 DIAGNOSIS — IMO0001 Reserved for inherently not codable concepts without codable children: Secondary | ICD-10-CM

## 2013-11-26 DIAGNOSIS — L988 Other specified disorders of the skin and subcutaneous tissue: Secondary | ICD-10-CM

## 2013-11-26 DIAGNOSIS — E1165 Type 2 diabetes mellitus with hyperglycemia: Secondary | ICD-10-CM

## 2013-11-26 NOTE — Patient Instructions (Signed)
Diabetes and Foot Care Diabetes may cause you to have problems because of poor blood supply (circulation) to your feet and legs. This may cause the skin on your feet to become thinner, break easier, and heal more slowly. Your skin may become dry, and the skin may peel and crack. You may also have nerve damage in your legs and feet causing decreased feeling in them. You may not notice minor injuries to your feet that could lead to infections or more serious problems. Taking care of your feet is one of the most important things you can do for yourself.  HOME CARE INSTRUCTIONS  Wear shoes at all times, even in the house. Do not go barefoot. Bare feet are easily injured.  Check your feet daily for blisters, cuts, and redness. If you cannot see the bottom of your feet, use a mirror or ask someone for help.  Wash your feet with warm water (do not use hot water) and mild soap. Then pat your feet and the areas between your toes until they are completely dry. Do not soak your feet as this can dry your skin.  Apply a moisturizing lotion or petroleum jelly (that does not contain alcohol and is unscented) to the skin on your feet and to dry, brittle toenails. Do not apply lotion between your toes.  Trim your toenails straight across. Do not dig under them or around the cuticle. File the edges of your nails with an emery board or nail file.  Do not cut corns or calluses or try to remove them with medicine.  Wear clean socks or stockings every day. Make sure they are not too tight. Do not wear knee-high stockings since they may decrease blood flow to your legs.  Wear shoes that fit properly and have enough cushioning. To break in new shoes, wear them for just a few hours a day. This prevents you from injuring your feet. Always look in your shoes before you put them on to be sure there are no objects inside.  Do not cross your legs. This may decrease the blood flow to your feet.  If you find a minor scrape,  cut, or break in the skin on your feet, keep it and the skin around it clean and dry. These areas may be cleansed with mild soap and water. Do not cleanse the area with peroxide, alcohol, or iodine.  When you remove an adhesive bandage, be sure not to damage the skin around it.  If you have a wound, look at it several times a day to make sure it is healing.  Do not use heating pads or hot water bottles. They may burn your skin. If you have lost feeling in your feet or legs, you may not know it is happening until it is too late.  Make sure your health care provider performs a complete foot exam at least annually or more often if you have foot problems. Report any cuts, sores, or bruises to your health care provider immediately. SEEK MEDICAL CARE IF:   You have an injury that is not healing.  You have cuts or breaks in the skin.  You have an ingrown nail.  You notice redness on your legs or feet.  You feel burning or tingling in your legs or feet.  You have pain or cramps in your legs and feet.  Your legs or feet are numb.  Your feet always feel cold. SEEK IMMEDIATE MEDICAL CARE IF:   There is increasing redness,   swelling, or pain in or around a wound.  There is a red line that goes up your leg.  Pus is coming from a wound.  You develop a fever or as directed by your health care provider.  You notice a bad smell coming from an ulcer or wound. Document Released: 03/29/2000 Document Revised: 12/02/2012 Document Reviewed: 09/08/2012 ExitCare Patient Information 2015 [employer]. This information is not intended to replace advice given to you by your health care provider. Make sure you discuss any questions you have with your health care provider.  

## 2013-11-26 NOTE — Progress Notes (Signed)
° °  Subjective:    Patient ID: Justin Houston, male    DOB: May 05, 1965, 48 y.o.   MRN: [id number]  HPI I AM HERE TO HAVE MY FEET CHECKED AND I AM A DIABETIC AND HAVE MY TOENAILS TRIMMED. Also has painful calluses bilateral feet. States his A1c was approximately 7 and his last blood glucose is 139. Denies any ulcerations. No claudication symptoms. No numbness.     Review of Systems  All other systems reviewed and are negative.      Objective:   Physical Exam  AAOx3, NAD Pedal pulses palpable, CRT < 3 sec. Protective sensation intact with the Semmes Weinstein monofilament Nails hypertrophic, elongated, brittle, yellow-brown discoloration x10. Hyperkeratotic lesion left fifth metatarsal base and right fifth metatarsal head. No open lesions.  No tenderness to palpation      Assessment & Plan:  48 year old diabetic male with painful elongated nails x10, hyperkeratotic lesions x2 -Importance of daily foot inspection and foot were discussed at length -Nail sharply debrided D40 without complication -Hyperkeratotic lesions sharply debrided as to without complication -Followup in 3 months or sooner becomes are to arise

## 2013-12-19 ENCOUNTER — Other Ambulatory Visit: Payer: Self-pay | Admitting: Physician Assistant

## 2014-01-08 ENCOUNTER — Telehealth: Payer: Self-pay

## 2014-01-08 NOTE — Telephone Encounter (Signed)
Cll pt - LMOVM of cell to call and schedule Diabetic Care follow up.

## 2014-01-21 ENCOUNTER — Other Ambulatory Visit: Payer: Self-pay | Admitting: Physician Assistant

## 2014-01-24 ENCOUNTER — Ambulatory Visit (INDEPENDENT_AMBULATORY_CARE_PROVIDER_SITE_OTHER): Payer: BC Managed Care – PPO | Admitting: Physician Assistant

## 2014-01-24 VITALS — BP 132/80 | HR 59 | Temp 97.8°F | Resp 16 | Ht 71.0 in | Wt 384.0 lb

## 2014-01-24 DIAGNOSIS — B079 Viral wart, unspecified: Secondary | ICD-10-CM

## 2014-01-24 DIAGNOSIS — D649 Anemia, unspecified: Secondary | ICD-10-CM

## 2014-01-24 DIAGNOSIS — E119 Type 2 diabetes mellitus without complications: Secondary | ICD-10-CM

## 2014-01-24 DIAGNOSIS — E669 Obesity, unspecified: Secondary | ICD-10-CM

## 2014-01-24 DIAGNOSIS — I10 Essential (primary) hypertension: Secondary | ICD-10-CM

## 2014-01-24 DIAGNOSIS — E785 Hyperlipidemia, unspecified: Secondary | ICD-10-CM

## 2014-01-24 DIAGNOSIS — B078 Other viral warts: Secondary | ICD-10-CM

## 2014-01-24 DIAGNOSIS — E1169 Type 2 diabetes mellitus with other specified complication: Secondary | ICD-10-CM

## 2014-01-24 DIAGNOSIS — K219 Gastro-esophageal reflux disease without esophagitis: Secondary | ICD-10-CM

## 2014-01-24 LAB — POCT CBC
Granulocyte percent: 56.9 %G (ref 37–80)
HCT, POC: 47 % (ref 43.5–53.7)
Hemoglobin: 15.3 g/dL (ref 14.1–18.1)
Lymph, poc: 3.1 (ref 0.6–3.4)
MCH, POC: 29.2 pg (ref 27–31.2)
MCHC: 32.6 g/dL (ref 31.8–35.4)
MCV: 89.6 fL (ref 80–97)
MID (cbc): 0.3 (ref 0–0.9)
MPV: 8.4 fL (ref 0–99.8)
POC GRANULOCYTE: 4.6 (ref 2–6.9)
POC LYMPH %: 39.1 % (ref 10–50)
POC MID %: 4 % (ref 0–12)
Platelet Count, POC: 206 10*3/uL (ref 142–424)
RBC: 5.25 M/uL (ref 4.69–6.13)
RDW, POC: 13.3 %
WBC: 8 10*3/uL (ref 4.6–10.2)

## 2014-01-24 LAB — GLUCOSE, POCT (MANUAL RESULT ENTRY): POC Glucose: 123 mg/dl — AB (ref 70–99)

## 2014-01-24 LAB — POCT GLYCOSYLATED HEMOGLOBIN (HGB A1C): Hemoglobin A1C: 10

## 2014-01-24 MED ORDER — INSULIN GLARGINE 100 UNIT/ML ~~LOC~~ SOLN: 92.0000 [IU] | Freq: Every day | SUBCUTANEOUS | Status: DC

## 2014-01-24 MED ORDER — METFORMIN HCL 1000 MG PO TABS: 1000.0000 mg | ORAL_TABLET | Freq: Two times a day (BID) | ORAL | Status: DC

## 2014-01-24 MED ORDER — ATENOLOL-CHLORTHALIDONE 50-25 MG PO TABS: ORAL_TABLET | ORAL | Status: DC

## 2014-01-24 MED ORDER — ATORVASTATIN CALCIUM 40 MG PO TABS: 40.0000 mg | ORAL_TABLET | Freq: Every day | ORAL | Status: DC

## 2014-01-24 MED ORDER — "INSULIN SYRINGE-NEEDLE U-100 31G X 5/16"" 1 ML MISC": Status: DC

## 2014-01-24 MED ORDER — ATENOLOL 50 MG PO TABS: 50.0000 mg | ORAL_TABLET | Freq: Every day | ORAL | Status: DC

## 2014-01-24 MED ORDER — CANAGLIFLOZIN 100 MG PO TABS: 100.0000 mg | ORAL_TABLET | Freq: Every morning | ORAL | Status: DC

## 2014-01-24 MED ORDER — RANITIDINE HCL 75 MG PO TABS: 75.0000 mg | ORAL_TABLET | Freq: Two times a day (BID) | ORAL | Status: DC

## 2014-01-24 MED ORDER — AMLODIPINE BESYLATE 10 MG PO TABS: 10.0000 mg | ORAL_TABLET | Freq: Every day | ORAL | Status: DC

## 2014-01-24 MED ORDER — GLIPIZIDE 10 MG PO TABS: 10.0000 mg | ORAL_TABLET | Freq: Two times a day (BID) | ORAL | Status: DC

## 2014-01-24 NOTE — Progress Notes (Signed)
Subjective:    Patient ID: Justin Houston, male    DOB: [birth year], 48 y.o.   MRN: [id number]  HPI Patient presents for follow up of DM/HTN/dyslipidemia and for reoccurring wart on hand. Since patient was here last he saw nutritionist and has been trying to implement those changes into his diet by eating more vegetables, less fried foods, and has decreased soda intake from 4 20 oz bottles/week to 2-3 bottles/week. Drinks 2-3 16 oz bottles of water/day. Activity is centered around walking at work. Plans on joining a gym this month. Does not check glucose at home often and says he will try to check in the mornings. Last home check was 150 during the day.   Has had wart that first appeared a year ago. Was frozen off a few months ago, but has returned with an additional one. Would like both frozen off today.  Health Maintenance: Received flu shot 2 weeks ago. Plans on making eye appointment this months.    Review of Systems  Constitutional: Negative for fever, activity change, appetite change and fatigue.  HENT: Negative.   Eyes: Negative for photophobia and visual disturbance.  Respiratory: Negative for cough, chest tightness, shortness of breath and wheezing.   Cardiovascular: Negative for chest pain, palpitations and leg swelling.  Gastrointestinal: Negative for nausea, vomiting, abdominal pain, diarrhea and constipation.  Genitourinary: Negative for frequency and decreased urine volume.  Musculoskeletal: Negative for arthralgias, back pain and myalgias.  Skin:       2 warts on R wrist  Allergic/Immunologic: Negative for environmental allergies and food allergies.  Neurological: Negative for dizziness, light-headedness, numbness and headaches.  Hematological: Negative for adenopathy.       Objective:   Physical Exam  Constitutional: He is oriented to person, place, and time. He appears well-developed and well-nourished. No distress.  HENT:  Head: Normocephalic and atraumatic.    Right Ear: External ear normal.  Left Ear: External ear normal.  Nose: Nose normal.  Mouth/Throat: Oropharynx is clear and moist.  Lipoma behind R ear  Eyes: Conjunctivae and EOM are normal. Pupils are equal, round, and reactive to light. Right eye exhibits no discharge. Left eye exhibits no discharge.  Neck: Neck supple.  Cardiovascular: Normal rate, regular rhythm, normal heart sounds and intact distal pulses.  Exam reveals no gallop and no friction rub.   No murmur heard. Bilateral varicose veins  Pulmonary/Chest: Effort normal and breath sounds normal. No respiratory distress. He has no wheezes. He has no rales.  Abdominal: Bowel sounds are normal. There is no tenderness. There is no rebound and no guarding.  Musculoskeletal: He exhibits edema (minimal). He exhibits no tenderness.  Lymphadenopathy:    He has no cervical adenopathy.  Neurological: He is alert and oriented to person, place, and time. Coordination normal.  Skin: Skin is warm and dry. No rash noted. He is not diaphoretic. No erythema. No pallor.  2 warts on L wrist.  Psychiatric: He has a normal mood and affect. His behavior is normal. Judgment and thought content normal.   See diabetic foot exam.  Blood pressure 132/80, pulse 59, temperature 97.8 F (36.6 C), temperature source Oral, resp. rate 16, height 5\' 11"  (1.803 m), weight 384 lb (174.181 kg), SpO2 98.00%.  Results for orders placed in visit on 01/24/14  POCT CBC      Result Value Ref Range   WBC 8.0  4.6 - 10.2 K/uL   Lymph, poc 3.1  0.6 - 3.4   POC LYMPH  PERCENT 39.1  10 - 50 %L   MID (cbc) 0.3  0 - 0.9   POC MID % 4.0  0 - 12 %M   POC Granulocyte 4.6  2 - 6.9   Granulocyte percent 56.9  37 - 80 %G   RBC 5.25  4.69 - 6.13 M/uL   Hemoglobin 15.3  14.1 - 18.1 g/dL   HCT, POC 47.0  43.5 - 53.7 %   MCV 89.6  80 - 97 fL   MCH, POC 29.2  27 - 31.2 pg   MCHC 32.6  31.8 - 35.4 g/dL   RDW, POC 13.3     Platelet Count, POC 206  142 - 424 K/uL   MPV 8.4  0  - 99.8 fL  GLUCOSE, POCT (MANUAL RESULT ENTRY)      Result Value Ref Range   POC Glucose 123 (*) 70 - 99 mg/dl  POCT GLYCOSYLATED HEMOGLOBIN (HGB A1C)      Result Value Ref Range   Hemoglobin A1C 10.0          Assessment & Plan:  1. Diabetes mellitus type 2 in obese Uncontrolled. Fourth medication, Invokana, added due to 2.3% increase in HA1C. Once HA1C has improved will removed glipizide and eventually lower insulin units. Patient instructed on importance of checking glucose at home and to watch for sx of hypoglycemia. Asked him to write home levels down and bring to next visit. Patient agrees with plan.  - POCT glucose (manual entry) - POCT glycosylated hemoglobin (Hb A1C) - Comprehensive metabolic panel - Microalbumin, urine - HM Diabetes Foot Exam - Canagliflozin (INVOKANA) 100 MG TABS; Take 1 tablet (100 mg total) by mouth every morning.  Dispense: 30 tablet; Refill: 2 - Insulin Syringe-Needle U-100 (B-D INS SYR ULTRAFINE 1CC/31G) 31G X 5/16" 1 ML MISC; USE AS DIRECTED ONCE A DAY WITH LANTUS  Dispense: 100 each; Refill: 1 - glipiZIDE (GLUCOTROL) 10 MG tablet; Take 1 tablet (10 mg total) by mouth 2 (two) times daily before a meal.  Dispense: 180 tablet; Refill: 3 - insulin glargine (LANTUS) 100 UNIT/ML injection; Inject 0.92 mLs (92 Units total) into the skin at bedtime.  Dispense: 30 mL; Refill: 12 - metFORMIN (GLUCOPHAGE) 1000 MG tablet; Take 1 tablet (1,000 mg total) by mouth 2 (two) times daily with a meal.  Dispense: 60 tablet; Refill: 2  2. Verruca vulgaris - Cryotherapy performed.  3. Essential hypertension Controlled with medication. Refills given. - Comprehensive metabolic panel - amLODipine (NORVASC) 10 MG tablet; Take 1 tablet (10 mg total) by mouth daily.  Dispense: 90 tablet; Refill: 1 - atenolol (TENORMIN) 50 MG tablet; Take 1 tablet (50 mg total) by mouth daily.  Dispense: 90 tablet; Refill: 3 - atenolol-chlorthalidone (TENORETIC) 50-25 MG per tablet; TAKE 1 TABLET  BY MOUTH DAILY.  Dispense: 90 tablet; Refill: 1  4. Hyperlipidemia - Lipid panel - atorvastatin (LIPITOR) 40 MG tablet; Take 1 tablet (40 mg total) by mouth daily.  Dispense: 90 tablet; Refill: 3  5. Anemia, unspecified anemia type Resolved. - POCT CBC  6. Gastroesophageal reflux disease without esophagitis Well controlled. - ranitidine (ZANTAC) 75 MG tablet; Take 1 tablet (75 mg total) by mouth 2 (two) times daily.  Dispense: 180 tablet; Refill: 3   Jaylah Goodlow PA-C  [facility] 01/24/2014 9:18 PM

## 2014-01-24 NOTE — Progress Notes (Signed)
I have examined this patient along with Ms. Ricki Miller, PA-C, directly observed the cryotherapy and agree. Unclear what changed since May that his A1C has risen so much. If he is not able to afford Invokana, or if we don't see considerable improvement, plan referral to endocrinology.

## 2014-01-25 LAB — COMPREHENSIVE METABOLIC PANEL
ALT: 14 U/L (ref 0–53)
AST: 14 U/L (ref 0–37)
Albumin: 4.1 g/dL (ref 3.5–5.2)
Alkaline Phosphatase: 102 U/L (ref 39–117)
BILIRUBIN TOTAL: 1.3 mg/dL — AB (ref 0.2–1.2)
BUN: 14 mg/dL (ref 6–23)
CO2: 29 meq/L (ref 19–32)
CREATININE: 0.93 mg/dL (ref 0.50–1.35)
Calcium: 9.6 mg/dL (ref 8.4–10.5)
Chloride: 99 mEq/L (ref 96–112)
Glucose, Bld: 124 mg/dL — ABNORMAL HIGH (ref 70–99)
Potassium: 3.7 mEq/L (ref 3.5–5.3)
Sodium: 139 mEq/L (ref 135–145)
Total Protein: 7.3 g/dL (ref 6.0–8.3)

## 2014-01-25 LAB — LIPID PANEL
CHOL/HDL RATIO: 4.1 ratio
Cholesterol: 135 mg/dL (ref 0–200)
HDL: 33 mg/dL — ABNORMAL LOW (ref >39)
LDL Cholesterol: 73 mg/dL (ref 0–99)
TRIGLYCERIDES: 145 mg/dL (ref <150)
VLDL: 29 mg/dL (ref 0–40)

## 2014-01-25 LAB — MICROALBUMIN, URINE: Microalb, Ur: 11.6 mg/dL — ABNORMAL HIGH (ref <2.0)

## 2014-01-26 ENCOUNTER — Telehealth: Payer: Self-pay | Admitting: Physician Assistant

## 2014-01-26 NOTE — Telephone Encounter (Signed)
Reviewed lab results with patient and he plans on picking Invokana up tomorrow. He has begun checking his glucose at home and writing it down. This mornings glucose was 135.

## 2014-03-04 ENCOUNTER — Ambulatory Visit: Payer: BC Managed Care – PPO | Admitting: Podiatry

## 2014-03-21 ENCOUNTER — Ambulatory Visit (INDEPENDENT_AMBULATORY_CARE_PROVIDER_SITE_OTHER): Payer: BC Managed Care – PPO | Admitting: Podiatry

## 2014-03-21 ENCOUNTER — Encounter: Payer: Self-pay | Admitting: Podiatry

## 2014-03-21 DIAGNOSIS — B351 Tinea unguium: Secondary | ICD-10-CM

## 2014-03-21 DIAGNOSIS — E1151 Type 2 diabetes mellitus with diabetic peripheral angiopathy without gangrene: Secondary | ICD-10-CM

## 2014-03-21 DIAGNOSIS — M79676 Pain in unspecified toe(s): Secondary | ICD-10-CM

## 2014-03-21 DIAGNOSIS — M2040 Other hammer toe(s) (acquired), unspecified foot: Secondary | ICD-10-CM

## 2014-03-21 DIAGNOSIS — Q828 Other specified congenital malformations of skin: Secondary | ICD-10-CM

## 2014-03-21 NOTE — Patient Instructions (Signed)
Diabetes and Foot Care Diabetes may cause you to have problems because of poor blood supply (circulation) to your feet and legs. This may cause the skin on your feet to become thinner, break easier, and heal more slowly. Your skin may become dry, and the skin may peel and crack. You may also have nerve damage in your legs and feet causing decreased feeling in them. You may not notice minor injuries to your feet that could lead to infections or more serious problems. Taking care of your feet is one of the most important things you can do for yourself.  HOME CARE INSTRUCTIONS  Wear shoes at all times, even in the house. Do not go barefoot. Bare feet are easily injured.  Check your feet daily for blisters, cuts, and redness. If you cannot see the bottom of your feet, use a mirror or ask someone for help.  Wash your feet with warm water (do not use hot water) and mild soap. Then pat your feet and the areas between your toes until they are completely dry. Do not soak your feet as this can dry your skin.  Apply a moisturizing lotion or petroleum jelly (that does not contain alcohol and is unscented) to the skin on your feet and to dry, brittle toenails. Do not apply lotion between your toes.  Trim your toenails straight across. Do not dig under them or around the cuticle. File the edges of your nails with an emery board or nail file.  Do not cut corns or calluses or try to remove them with medicine.  Wear clean socks or stockings every day. Make sure they are not too tight. Do not wear knee-high stockings since they may decrease blood flow to your legs.  Wear shoes that fit properly and have enough cushioning. To break in new shoes, wear them for just a few hours a day. This prevents you from injuring your feet. Always look in your shoes before you put them on to be sure there are no objects inside.  Do not cross your legs. This may decrease the blood flow to your feet.  If you find a minor scrape,  cut, or break in the skin on your feet, keep it and the skin around it clean and dry. These areas may be cleansed with mild soap and water. Do not cleanse the area with peroxide, alcohol, or iodine.  When you remove an adhesive bandage, be sure not to damage the skin around it.  If you have a wound, look at it several times a day to make sure it is healing.  Do not use heating pads or hot water bottles. They may burn your skin. If you have lost feeling in your feet or legs, you may not know it is happening until it is too late.  Make sure your health care provider performs a complete foot exam at least annually or more often if you have foot problems. Report any cuts, sores, or bruises to your health care provider immediately. SEEK MEDICAL CARE IF:   You have an injury that is not healing.  You have cuts or breaks in the skin.  You have an ingrown nail.  You notice redness on your legs or feet.  You feel burning or tingling in your legs or feet.  You have pain or cramps in your legs and feet.  Your legs or feet are numb.  Your feet always feel cold. SEEK IMMEDIATE MEDICAL CARE IF:   There is increasing redness,   swelling, or pain in or around a wound.  There is a red line that goes up your leg.  Pus is coming from a wound.  You develop a fever or as directed by your health care provider.  You notice a bad smell coming from an ulcer or wound. Document Released: 03/29/2000 Document Revised: 12/02/2012 Document Reviewed: 09/08/2012 ExitCare Patient Information 2015 [employer]. This information is not intended to replace advice given to you by your health care provider. Make sure you discuss any questions you have with your health care provider.  

## 2014-03-22 NOTE — Progress Notes (Signed)
Subjective:     Patient ID: Justin Houston, male   DOB: 1966/02/22, 48 y.o.   MRN: [id number]  HPI patient presents with nail disease and thickness 1-5 both feet that he cannot cut and also presents with keratotic lesions underneath both eat first and fifth metatarsals that are painful when pressed and he cannot take care of with long-term diabetes as complicating factor   Review of Systems     Objective:   Physical Exam  neurovascular status unchanged with thick yellow riddle nailbeds 1-5 both feet that are painful and noted to have keratotic lesions on both feet that are painful    Assessment:      nail disease 1-5 both feet and lesion formation bilateral with porokeratotic base    Plan:      debride painful nailbeds 1-5 both feet and debridement lesions on both feet with no iatrogenic bleeding noted

## 2014-03-26 ENCOUNTER — Telehealth: Payer: Self-pay

## 2014-03-26 MED ORDER — GLUCOSE BLOOD VI STRP: ORAL_STRIP | Status: DC

## 2014-03-26 NOTE — Telephone Encounter (Signed)
Sent and pt notified. 

## 2014-03-26 NOTE — Telephone Encounter (Signed)
Pt is requesting diabetic test strips for his meter.

## 2014-04-04 ENCOUNTER — Other Ambulatory Visit: Payer: Self-pay | Admitting: Family Medicine

## 2014-05-02 ENCOUNTER — Other Ambulatory Visit: Payer: Self-pay | Admitting: Physician Assistant

## 2014-06-11 ENCOUNTER — Other Ambulatory Visit: Payer: Self-pay | Admitting: Physician Assistant

## 2014-06-20 ENCOUNTER — Ambulatory Visit: Payer: BC Managed Care – PPO | Admitting: Podiatry

## 2014-07-22 ENCOUNTER — Ambulatory Visit (INDEPENDENT_AMBULATORY_CARE_PROVIDER_SITE_OTHER): Payer: BC Managed Care – PPO | Admitting: Physician Assistant

## 2014-07-22 VITALS — BP 158/90 | HR 75 | Temp 97.8°F | Resp 18 | Ht 72.0 in | Wt 372.0 lb

## 2014-07-22 DIAGNOSIS — L309 Dermatitis, unspecified: Secondary | ICD-10-CM

## 2014-07-22 DIAGNOSIS — E119 Type 2 diabetes mellitus without complications: Secondary | ICD-10-CM | POA: Diagnosis not present

## 2014-07-22 DIAGNOSIS — Z1211 Encounter for screening for malignant neoplasm of colon: Secondary | ICD-10-CM | POA: Diagnosis not present

## 2014-07-22 DIAGNOSIS — I10 Essential (primary) hypertension: Secondary | ICD-10-CM

## 2014-07-22 DIAGNOSIS — E669 Obesity, unspecified: Secondary | ICD-10-CM | POA: Diagnosis not present

## 2014-07-22 DIAGNOSIS — E785 Hyperlipidemia, unspecified: Secondary | ICD-10-CM | POA: Diagnosis not present

## 2014-07-22 DIAGNOSIS — E1169 Type 2 diabetes mellitus with other specified complication: Secondary | ICD-10-CM

## 2014-07-22 LAB — COMPREHENSIVE METABOLIC PANEL
ALT: 14 U/L (ref 0–53)
AST: 19 U/L (ref 0–37)
Albumin: 4.2 g/dL (ref 3.5–5.2)
Alkaline Phosphatase: 91 U/L (ref 39–117)
BILIRUBIN TOTAL: 2 mg/dL — AB (ref 0.2–1.2)
BUN: 13 mg/dL (ref 6–23)
CO2: 26 mEq/L (ref 19–32)
Calcium: 9.5 mg/dL (ref 8.4–10.5)
Chloride: 103 mEq/L (ref 96–112)
Creat: 0.85 mg/dL (ref 0.50–1.35)
GLUCOSE: 96 mg/dL (ref 70–99)
Potassium: 3.5 mEq/L (ref 3.5–5.3)
Sodium: 140 mEq/L (ref 135–145)
Total Protein: 7.2 g/dL (ref 6.0–8.3)

## 2014-07-22 LAB — GLUCOSE, POCT (MANUAL RESULT ENTRY): POC Glucose: 95 mg/dl (ref 70–99)

## 2014-07-22 LAB — POCT GLYCOSYLATED HEMOGLOBIN (HGB A1C): Hemoglobin A1C: 7

## 2014-07-22 MED ORDER — CANAGLIFLOZIN 100 MG PO TABS: 100.0000 mg | ORAL_TABLET | Freq: Every day | ORAL | Status: DC

## 2014-07-22 MED ORDER — CLOBETASOL PROPIONATE 0.05 % EX OINT: TOPICAL_OINTMENT | Freq: Three times a day (TID) | CUTANEOUS | Status: DC | PRN

## 2014-07-22 NOTE — Progress Notes (Signed)
Patient ID: Justin Houston, male    DOB: 1965/04/29, 49 y.o.   MRN: [id number]  PCP: Justin Houston  Subjective:   Chief Complaint  Patient presents with  . Follow-up    dm check     HPI Justin Houston presents for re-evaluation of DM, HTN, hyperlipidemia. He's doing well, without complaints other than some knee pain. He's going to the gym regularly, and while he's not sure if he has lost any weight, but he feels better and thinks his clothes are a bit looser. He needs a couple of refills. Tolerating medications well, without any adverse effects.  He's planning to have the large lipoma on the RIGHT neck/head removed. He's already seen the surgeon, and is saving up money to have it done.  Frequency of home glucose monitoring: QAM until a couple of weeks ago. Fasting readings generally 100-140's. Occasional readings > 200, usually in the evenings. Lowest reading 77. Sees a dentist Q6 months (visit today), eye specialist annually (needs to schedule). Checks feet daily. Has purachased a pair of DM shoes. Is current with influenza vaccine. Is current with pneumococcal vaccine.  Review of Systems Review of Systems  Constitutional: Negative for fever, chills, diaphoresis and fatigue.  Respiratory: Negative for cough, chest tightness and shortness of breath.   Cardiovascular: Negative for chest pain, palpitations and leg swelling.  Gastrointestinal: Negative for nausea, vomiting, abdominal pain, diarrhea and constipation.  Endocrine: Negative for polydipsia and polyuria.  Genitourinary: Negative for dysuria, urgency, frequency, hematuria and flank pain.  Musculoskeletal: Positive for arthralgias (knees). Negative for myalgias, joint swelling and gait problem.  Skin: Negative for rash.  Neurological: Negative for dizziness, weakness, light-headedness and headaches.  Psychiatric/Behavioral: The patient is not nervous/anxious.        Patient Active Problem List   Diagnosis Date Noted   . Umbilical hernia, incarcerated 09/29/2013  . Mass of head, right postauricular SQ 7x4cm 09/29/2013  . Glaucoma 05/26/2013  . Gout 08/01/2012  . Obesity, morbid, BMI 50 or higher 08/01/2012  . Hypertension 07/20/2011  . Diabetes mellitus type 2 in obese 07/20/2011  . Hyperlipidemia 07/20/2011     Prior to Admission medications   Medication Sig Start Date End Date Taking? Authorizing Provider  amLODipine (NORVASC) 10 MG tablet Take 1 tablet (10 mg total) by mouth daily. 01/24/14  Yes Tishira R Brewington, PA-C  atenolol-chlorthalidone (TENORETIC) 50-25 MG per tablet TAKE 1 TABLET BY MOUTH DAILY. 01/24/14  Yes Tishira R Brewington, PA-C  atorvastatin (LIPITOR) 40 MG tablet Take 1 tablet (40 mg total) by mouth daily. 01/24/14  Yes Tishira R Brewington, PA-C  canagliflozin (INVOKANA) 100 MG TABS tablet Take 1 tablet (100 mg total) by mouth daily. PATIENT NEEDS OFFICE VISIT FOR ADDITIONAL REFILLS 06/12/14  Yes Demian Maisel Janalee Dane, PA-C  FREESTYLE INSULINX TEST test strip CHECK BLOOD SUGAR 3 TIMES DAILY 04/05/14  Yes Tishira R Brewington, PA-C  glipiZIDE (GLUCOTROL) 10 MG tablet Take 1 tablet (10 mg total) by mouth 2 (two) times daily before a meal. 01/24/14  Yes Tishira R Brewington, PA-C  insulin glargine (LANTUS) 100 UNIT/ML injection Inject 0.92 mLs (92 Units total) into the skin at bedtime. 01/24/14  Yes Tishira R Brewington, PA-C  Insulin Syringe-Needle U-100 (B-D INS SYR ULTRAFINE 1CC/31G) 31G X 5/16" 1 ML MISC USE AS DIRECTED ONCE A DAY WITH LANTUS 01/24/14  Yes Tishira R Brewington, PA-C  metFORMIN (GLUCOPHAGE) 1000 MG tablet Take 1 tablet (1,000 mg total) by mouth 2 (two) times daily with a meal. 01/24/14  Yes Tishira R Brewington, PA-C  ranitidine (ZANTAC) 75 MG tablet Take 1 tablet (75 mg total) by mouth 2 (two) times daily. 01/24/14  Yes Tishira R Brewington, PA-C  clobetasol ointment (TEMOVATE) 0.05 % Apply topically 3 (three) times daily. Patient not taking: Reported on 07/22/2014  04/21/12   Thao P Le, DO     Allergies  Allergen Reactions  . Losartan Potassium-Hctz Hives       Objective:  Physical Exam  Physical Exam  Constitutional: He is oriented to person, place, and time. Vital signs are normal. He appears well-developed and well-nourished. He is active and cooperative. No distress.  BP 158/90 mmHg  Pulse 75  Temp(Src) 97.8 F (36.6 C) (Oral)  Resp 18  Ht 6' (1.829 m)  Wt 372 lb (168.738 kg)  BMI 50.44 kg/m2  SpO2 98%  HENT:  Head: Normocephalic and atraumatic.  Right Ear: Hearing normal.  Left Ear: Hearing normal.  Eyes: Conjunctivae are normal. No scleral icterus.  Neck: Normal range of motion. Neck supple. No thyromegaly present.  Cardiovascular: Normal rate, regular rhythm and normal heart sounds.   Pulses:      Radial pulses are 2+ on the right side, and 2+ on the left side.  Pulmonary/Chest: Effort normal and breath sounds normal.  Lymphadenopathy:       Head (right side): No tonsillar, no preauricular, no posterior auricular and no occipital adenopathy present.       Head (left side): No tonsillar, no preauricular, no posterior auricular and no occipital adenopathy present.    He has no cervical adenopathy.       Right: No supraclavicular adenopathy present.       Left: No supraclavicular adenopathy present.  Neurological: He is alert and oriented to person, place, and time. No sensory deficit.  Skin: Skin is warm, dry and intact. No rash noted. No cyanosis or erythema. Nails show no clubbing.  Psychiatric: He has a normal mood and affect. His behavior is normal. Thought content normal.    Diabetic Foot Exam - Simple   Simple Foot Form  Diabetic Foot exam was performed with the following findings:  Yes 07/22/2014  4:17 PM  Visual Inspection  No deformities, no ulcerations, no other skin breakdown bilaterally:  Yes  Sensation Testing  Intact to touch and monofilament testing bilaterally:  Yes  Pulse Check  Posterior Tibialis and  Dorsalis pulse intact bilaterally:  Yes  Comments            Assessment & Plan:  1. Diabetes mellitus type 2 in obese Await lab results. Needs good control. WEight loss will help, so he should continue with lifestyle changes. Needs eye exam. Seeing dentist this afternoon! - HM Diabetes Eye Exam - HM Diabetes Foot Exam - POCT glucose (manual entry) - POCT glycosylated hemoglobin (Hb A1C) - Microalbumin, urine - Comprehensive metabolic panel - canagliflozin (INVOKANA) 100 MG TABS tablet; Take 1 tablet (100 mg total) by mouth daily.  Dispense: 90 tablet; Refill: 3  2. Essential hypertension Above goal today. He's usually well controlled. Continue lifestyle modification and re-evaluate at next visit in 3 months.  3. Hyperlipidemia Continue current management and lifestyle changes.  4. Obesity, morbid, BMI 50 or higher See above. He's lost 12 pounds since his last visit.  5. Eczema Inadequate control with OTC products. Counseled on good skin hygeine. - clobetasol ointment (TEMOVATE) 0.05 %; Apply topically 3 (three) times daily as needed.  Dispense: 60 g; Refill: 1  6. Screening for colon cancer -  Ambulatory referral to Gastroenterology  Return in about 3 months (around 10/21/2014) for re-evaluation.   Fara Chute, PA-C Physician Assistant-Certified [facility]

## 2014-07-22 NOTE — Patient Instructions (Signed)
I will contact you with your lab results as soon as they are available.   If you have not heard from me in 2 weeks, please contact me.  The fastest way to get your results is to register for My Chart (see the instructions on the last page of this printout).   

## 2014-07-23 LAB — MICROALBUMIN, URINE: Microalb, Ur: 7 mg/dL — ABNORMAL HIGH (ref <2.0)

## 2014-07-26 ENCOUNTER — Encounter: Payer: Self-pay | Admitting: Physician Assistant

## 2014-07-26 ENCOUNTER — Telehealth: Payer: Self-pay | Admitting: Internal Medicine

## 2014-07-26 ENCOUNTER — Ambulatory Visit: Payer: BC Managed Care – PPO

## 2014-07-26 NOTE — Telephone Encounter (Signed)
Dustin please see Dr. Blanch Media comments below, pt can be set up for OV with any provider.

## 2014-07-26 NOTE — Telephone Encounter (Signed)
Pt has referral in [facility] for screening colonoscopy, but weight due to weight, pt will need to be scheduled at [facility]   372 lb (168.738 kg).

## 2014-07-26 NOTE — Telephone Encounter (Signed)
Set up office appointment with any provider

## 2014-07-26 NOTE — Telephone Encounter (Signed)
Pt referred for screening colon, pts wt 372 so needs to be done at hospital. Would pt need an OV first or direct procedure at hospital? Dr. Henrene Pastor as doc of the day please advise.

## 2014-08-29 ENCOUNTER — Encounter: Payer: Self-pay | Admitting: Internal Medicine

## 2014-09-17 ENCOUNTER — Other Ambulatory Visit: Payer: Self-pay | Admitting: Family Medicine

## 2014-10-02 ENCOUNTER — Ambulatory Visit (INDEPENDENT_AMBULATORY_CARE_PROVIDER_SITE_OTHER): Payer: BC Managed Care – PPO | Admitting: Family Medicine

## 2014-10-02 VITALS — BP 158/84 | HR 71 | Temp 98.2°F | Resp 18 | Ht 71.0 in | Wt 379.2 lb

## 2014-10-02 DIAGNOSIS — E669 Obesity, unspecified: Secondary | ICD-10-CM

## 2014-10-02 DIAGNOSIS — L84 Corns and callosities: Secondary | ICD-10-CM

## 2014-10-02 DIAGNOSIS — E1169 Type 2 diabetes mellitus with other specified complication: Secondary | ICD-10-CM

## 2014-10-02 DIAGNOSIS — E119 Type 2 diabetes mellitus without complications: Secondary | ICD-10-CM

## 2014-10-02 NOTE — Patient Instructions (Signed)
Corns and Calluses Corns are small areas of thickened skin that usually occur on the top, sides, or tip of a toe. They contain a cone-shaped core with a point that can press on a nerve below. This causes pain. Calluses are areas of thickened skin that usually develop on hands, fingers, palms, soles of the feet, and heels. These are areas that experience frequent friction or pressure. CAUSES  Corns are usually the result of rubbing (friction) or pressure from shoes that are too tight or do not fit properly. Calluses are caused by repeated friction and pressure on the affected areas. SYMPTOMS  A hard growth on the skin.  Pain or tenderness under the skin.  Sometimes, redness and swelling.  Increased discomfort while wearing tight-fitting shoes. DIAGNOSIS  Your caregiver can usually tell what the problem is by doing a physical exam. TREATMENT  Removing the cause of the friction or pressure is usually the only treatment needed. However, sometimes medicines can be used to help soften the hardened, thickened areas. These medicines include salicylic acid plasters and 12% ammonium lactate lotion. These medicines should only be used under the direction of your caregiver. HOME CARE INSTRUCTIONS   Try to remove pressure from the affected area.  You may wear donut-shaped corn pads to protect your skin.  You may use a pumice stone or nonmetallic nail file to gently reduce the thickness of a corn.  Wear properly fitted footwear.  If you have calluses on the hands, wear gloves during activities that cause friction.  If you have diabetes, you should regularly examine your feet. Tell your caregiver if you notice any problems with your feet. SEEK IMMEDIATE MEDICAL CARE IF:   You have increased pain, swelling, redness, or warmth in the affected area.  Your corn or callus starts to drain fluid or bleeds.  You are not getting better, even with treatment. Document Released: 01/06/2004 Document  Revised: 06/24/2011 Document Reviewed: 11/27/2010 Exit[employer] Patient Information 2015 [employer]. This information is not intended to replace advice given to you by your health care provider. Make sure you discuss any questions you have with your health care provider.  

## 2014-10-02 NOTE — Progress Notes (Signed)
Subjective:    Patient ID: Justin Houston, male    DOB: [birth year], 49 y.o.   MRN: [id number]  10/02/2014  Callouses   HPI This 49 y.o. male presents for evaluation of foot pain L.  Last time went to foot doctor, trimmed down a callus.  Has callus along foot on L for years; podiatrist trimmed down callus but recurs. Now very painful for the past 1-2 days. Last podiatry visit three months ago.  No injury; no swelling.  Does a lot of walking.  No n/t.  No sores or skin break down.  No redness around callus. No drainage. No fever/chills/sweats.  Sugars are running good. Denies n/t/w in foot.    Review of Systems  Constitutional: Negative for fever, chills, diaphoresis and fatigue.  Musculoskeletal: Positive for gait problem. Negative for arthralgias.  Skin: Negative for color change, pallor, rash and wound.  Neurological: Negative for weakness and numbness.    Past Medical History  Diagnosis Date  . Obesity   . Lipoma of scalp   . Diabetes mellitus   . Hypertension   . Hyperlipidemia   . GERD (gastroesophageal reflux disease)   . Dermatitis    Past Surgical History  Procedure Laterality Date  . Right arm orif     Allergies  Allergen Reactions  . Losartan Potassium-Hctz Hives   Current Outpatient Prescriptions  Medication Sig Dispense Refill  . amLODipine (NORVASC) 10 MG tablet Take 1 tablet (10 mg total) by mouth daily. 90 tablet 0  . atenolol-chlorthalidone (TENORETIC) 50-25 MG per tablet TAKE 1 TABLET BY MOUTH DAILY. 90 tablet 0  . atorvastatin (LIPITOR) 40 MG tablet Take 1 tablet (40 mg total) by mouth daily. 90 tablet 3  . canagliflozin (INVOKANA) 100 MG TABS tablet Take 1 tablet (100 mg total) by mouth daily. 90 tablet 3  . clobetasol ointment (TEMOVATE) 0.05 % Apply topically 3 (three) times daily as needed. 60 g 1  . FREESTYLE INSULINX TEST test strip CHECK BLOOD SUGAR 3 TIMES DAILY 100 each 2  . glipiZIDE (GLUCOTROL) 10 MG tablet Take 1 tablet (10 mg total) by  mouth 2 (two) times daily before a meal. 180 tablet 3  . insulin glargine (LANTUS) 100 UNIT/ML injection Inject 0.92 mLs (92 Units total) into the skin at bedtime. 30 mL 12  . Insulin Syringe-Needle U-100 (B-D INS SYR ULTRAFINE 1CC/31G) 31G X 5/16" 1 ML MISC USE AS DIRECTED ONCE A DAY WITH LANTUS 100 each 1  . metFORMIN (GLUCOPHAGE) 1000 MG tablet Take 1 tablet (1,000 mg total) by mouth 2 (two) times daily with a meal. 60 tablet 2  . ranitidine (ZANTAC) 75 MG tablet Take 1 tablet (75 mg total) by mouth 2 (two) times daily. 180 tablet 3   No current facility-administered medications for this visit.   History   Social History  . Marital Status: Single    Spouse Name: n/a  . Number of Children: 0  . Years of Education: 18   Occupational History  . [place] History Main Topics  . Smoking status: Former Smoker    Quit date: 09/05/1994  . Smokeless tobacco: Never Used  . Alcohol Use: No  . Drug Use: No  . Sexual Activity: No   Other Topics Concern  . Not on file   Social History Narrative   Master's Degree in Adult Education.  Lives alone.   No siblings.       Objective:  BP 158/84 mmHg  Pulse 71  Temp(Src) 98.2 F (36.8 C) (Oral)  Resp 18  Ht 5\' 11"  (1.803 m)  Wt 379 lb 4 oz (172.027 kg)  BMI 52.92 kg/m2  SpO2 98% Physical Exam  Constitutional: He appears well-developed and well-nourished. No distress.  +morbid obesity  Skin: No rash noted. He is not diaphoretic. No erythema. No pallor.  L foot with 1.5cm diameter callus along distal lateral foot.  No surrounding erythema or fluctuance.  No vesicles or pustules.  +TTP central aspect of callus.   PROCEDURE: VERBAL CONSENT OBTAINED; L FOOT CALLUS SHAVED/PAIRED DOWN WITH STERILE 10 BLADE.  TOLERATED PROCEDURE WELL.       Assessment & Plan:   1. Foot callus   2. Diabetes mellitus type 2 in obese    -Chronic with recent worsening. -s/p shaving/pairing of callus.     -Local wound care. -Discuss orthotics with podiatrist at next visit. -RTC for increasing pain, redness, drainage.   No orders of the defined types were placed in this encounter.    No Follow-up on file.     Kristi Elayne Guerin, M.D. [facility] [address] [place], [place]  [address] [phone] phone [phone] fax

## 2014-10-20 ENCOUNTER — Other Ambulatory Visit: Payer: Self-pay | Admitting: Physician Assistant

## 2014-10-21 ENCOUNTER — Encounter: Payer: Self-pay | Admitting: Nurse Practitioner

## 2014-11-08 ENCOUNTER — Encounter: Payer: BC Managed Care – PPO | Admitting: Internal Medicine

## 2014-11-09 ENCOUNTER — Telehealth: Payer: Self-pay | Admitting: Gastroenterology

## 2014-11-09 ENCOUNTER — Ambulatory Visit (INDEPENDENT_AMBULATORY_CARE_PROVIDER_SITE_OTHER): Payer: BC Managed Care – PPO | Admitting: Nurse Practitioner

## 2014-11-09 ENCOUNTER — Encounter: Payer: Self-pay | Admitting: Nurse Practitioner

## 2014-11-09 DIAGNOSIS — Z1211 Encounter for screening for malignant neoplasm of colon: Secondary | ICD-10-CM

## 2014-11-09 NOTE — Patient Instructions (Signed)

## 2014-11-09 NOTE — Telephone Encounter (Signed)
Pt wants to leave as is he will talk to his family and call back with any problems

## 2014-11-09 NOTE — Progress Notes (Signed)
HPI :  Patient is a pleasant 49 year old male referred by PCP for colon cancer screening. He is morbidly obese with BMI greater than 50. Patient has no family history colon cancer. He has no GI complaints such as abdominal pain, bowel changes, or rectal bleeding.   Past Medical History  Diagnosis Date  . Obesity   . Lipoma of scalp   . Diabetes mellitus   . Hypertension   . Hyperlipidemia   . GERD (gastroesophageal reflux disease)   . Dermatitis     Family History  Problem Relation Age of Onset  . Stroke Mother   . Hypertension Mother   . Diabetes Other   . Diabetes Maternal Grandmother   . Prostate cancer Maternal Grandfather    History  Substance Use Topics  . Smoking status: Former Smoker    Quit date: 09/05/1994  . Smokeless tobacco: Never Used  . Alcohol Use: No   Current Outpatient Prescriptions  Medication Sig Dispense Refill  . amLODipine (NORVASC) 10 MG tablet Take 1 tablet (10 mg total) by mouth daily. 90 tablet 0  . atenolol-chlorthalidone (TENORETIC) 50-25 MG per tablet TAKE 1 TABLET BY MOUTH DAILY. 90 tablet 0  . atorvastatin (LIPITOR) 40 MG tablet Take 1 tablet (40 mg total) by mouth daily. 90 tablet 3  . canagliflozin (INVOKANA) 100 MG TABS tablet Take 1 tablet (100 mg total) by mouth daily. 90 tablet 3  . clobetasol ointment (TEMOVATE) 0.05 % Apply topically 3 (three) times daily as needed. 60 g 1  . FREESTYLE INSULINX TEST test strip CHECK BLOOD SUGAR 3 TIMES DAILY 100 each 2  . glipiZIDE (GLUCOTROL) 10 MG tablet Take 1 tablet (10 mg total) by mouth 2 (two) times daily before a meal. 180 tablet 3  . insulin glargine (LANTUS) 100 UNIT/ML injection Inject 0.92 mLs (92 Units total) into the skin at bedtime. 30 mL 12  . Insulin Syringe-Needle U-100 (B-D INS SYR ULTRAFINE 1CC/31G) 31G X 5/16" 1 ML MISC USE AS DIRECTED ONCE A DAY WITH LANTUS 100 each 0  . metFORMIN (GLUCOPHAGE) 1000 MG tablet Take 1 tablet (1,000 mg total) by mouth 2 (two) times daily with a  meal. 60 tablet 2  . ranitidine (ZANTAC) 75 MG tablet Take 1 tablet (75 mg total) by mouth 2 (two) times daily. 180 tablet 3   No current facility-administered medications for this visit.   Allergies  Allergen Reactions  . Losartan Potassium-Hctz Hives     Review of Systems: All systems reviewed and negative except where noted in HPI.   Physical Exam: BP 162/90 mmHg  Pulse 84  Ht 5' 11.5" (1.816 m)  Wt 374 lb 3.2 oz (169.736 kg)  BMI 51.47 kg/m2 Constitutional: Pleasant, morbidly obese black male in no acute distress. HEENT: Normocephalic and atraumatic. Lost lipoma behind right ear.  Neck supple.  Cardiovascular: Normal rate, regular rhythm.  Pulmonary/chest: Effort normal and breath sounds normal. No wheezing, rales or rhonchi. Neurological: Alert and oriented to person place and time. Psychiatric: Normal mood and affect. Behavior is normal.   ASSESSMENT AND PLAN:  1. Pleasant, morbidly obese male for colon cancer screening. BMI greater than 50. Patient has a family history colon cancer, no GI complaints. Patient does understand that he is at increased risk for procedures given morbid obesity. He agrees to proceed with colonoscopy for cancer screening. Procedure will need to be done at the hospital given BMI. The risks, benefits, and alternatives to colonoscopy with possible biopsy and possible polypectomy were  discussed with the patient and he consents to proceed.   2. DM / HTN   Cc: Harrison Mons, PA-C

## 2014-11-10 ENCOUNTER — Encounter: Payer: Self-pay | Admitting: Nurse Practitioner

## 2014-11-10 DIAGNOSIS — Z1211 Encounter for screening for malignant neoplasm of colon: Secondary | ICD-10-CM | POA: Insufficient documentation

## 2014-11-10 NOTE — Progress Notes (Signed)
i agree with the above note, plan 

## 2014-11-25 ENCOUNTER — Other Ambulatory Visit: Payer: Self-pay | Admitting: Physician Assistant

## 2014-11-25 NOTE — Telephone Encounter (Signed)
Patient would like additional refills for metformin sent to the pharmacy

## 2014-11-28 ENCOUNTER — Encounter (HOSPITAL_COMMUNITY): Payer: Self-pay | Admitting: *Deleted

## 2014-11-29 LAB — HM DIABETES EYE EXAM

## 2014-11-30 NOTE — Anesthesia Preprocedure Evaluation (Addendum)
Anesthesia Evaluation  Patient identified by MRN, date of birth, ID band Patient awake    Reviewed: Allergy & Precautions, NPO status , Patient's Chart, lab work & pertinent test results, reviewed documented beta blocker date and time   Airway Mallampati: III  TM Distance: >3 FB Neck ROM: Full    Dental  (+) Teeth Intact, Chipped   Pulmonary former smoker,  breath sounds clear to auscultation  Pulmonary exam normal       Cardiovascular Exercise Tolerance: Good hypertension, Pt. on medications and Pt. on home beta blockers Normal cardiovascular examRhythm:Regular Rate:Normal     Neuro/Psych negative neurological ROS  negative psych ROS   GI/Hepatic Neg liver ROS, GERD-  Controlled and Medicated,  Endo/Other  diabetes, Type 2, Insulin Dependent, Oral Hypoglycemic AgentsMorbid obesity  Renal/GU negative Renal ROS     Musculoskeletal negative musculoskeletal ROS (+)   Abdominal   Peds  Hematology negative hematology ROS (+)   Anesthesia Other Findings Day of surgery medications reviewed with the patient.  Reproductive/Obstetrics                            Anesthesia Physical Anesthesia Plan  ASA: III  Anesthesia Plan: MAC   Post-op Pain Management:    Induction: Intravenous  Airway Management Planned: Nasal Cannula  Additional Equipment:   Intra-op Plan:   Post-operative Plan:   Informed Consent: I have reviewed the patients History and Physical, chart, labs and discussed the procedure including the risks, benefits and alternatives for the proposed anesthesia with the patient or authorized representative who has indicated his/her understanding and acceptance.   Dental advisory given  Plan Discussed with: CRNA and Anesthesiologist  Anesthesia Plan Comments: (Discussed risks/benefits/alternatives to MAC sedation including need for ventilatory support, hypotension, need for  conversion to general anesthesia.  All patient questions answered.  Patient wished to proceed.)        Anesthesia Quick Evaluation

## 2014-12-01 ENCOUNTER — Encounter (HOSPITAL_COMMUNITY): Payer: Self-pay | Admitting: Gastroenterology

## 2014-12-01 ENCOUNTER — Encounter (HOSPITAL_COMMUNITY)
Admission: RE | Disposition: A | Discharge status: Home / Self Care | Payer: Self-pay | Source: Ambulatory Visit | Attending: Gastroenterology

## 2014-12-01 ENCOUNTER — Ambulatory Visit (HOSPITAL_COMMUNITY): Payer: BC Managed Care – PPO | Admitting: Anesthesiology

## 2014-12-01 ENCOUNTER — Ambulatory Visit (HOSPITAL_COMMUNITY)
Admission: RE | Admit: 2014-12-01 | Discharge: 2014-12-01 | Disposition: A | Discharge status: Home / Self Care | Payer: BC Managed Care – PPO | Source: Ambulatory Visit | Attending: Gastroenterology | Admitting: Gastroenterology

## 2014-12-01 DIAGNOSIS — E785 Hyperlipidemia, unspecified: Secondary | ICD-10-CM | POA: Insufficient documentation

## 2014-12-01 DIAGNOSIS — D123 Benign neoplasm of transverse colon: Secondary | ICD-10-CM | POA: Insufficient documentation

## 2014-12-01 DIAGNOSIS — Z79899 Other long term (current) drug therapy: Secondary | ICD-10-CM | POA: Insufficient documentation

## 2014-12-01 DIAGNOSIS — D170 Benign lipomatous neoplasm of skin and subcutaneous tissue of head, face and neck: Secondary | ICD-10-CM | POA: Insufficient documentation

## 2014-12-01 DIAGNOSIS — E119 Type 2 diabetes mellitus without complications: Secondary | ICD-10-CM | POA: Diagnosis not present

## 2014-12-01 DIAGNOSIS — Z833 Family history of diabetes mellitus: Secondary | ICD-10-CM | POA: Insufficient documentation

## 2014-12-01 DIAGNOSIS — Z6841 Body Mass Index (BMI) 40.0 and over, adult: Secondary | ICD-10-CM | POA: Diagnosis not present

## 2014-12-01 DIAGNOSIS — Z7952 Long term (current) use of systemic steroids: Secondary | ICD-10-CM | POA: Diagnosis not present

## 2014-12-01 DIAGNOSIS — I10 Essential (primary) hypertension: Secondary | ICD-10-CM | POA: Diagnosis not present

## 2014-12-01 DIAGNOSIS — Z1211 Encounter for screening for malignant neoplasm of colon: Secondary | ICD-10-CM | POA: Insufficient documentation

## 2014-12-01 DIAGNOSIS — K219 Gastro-esophageal reflux disease without esophagitis: Secondary | ICD-10-CM | POA: Diagnosis not present

## 2014-12-01 DIAGNOSIS — Z794 Long term (current) use of insulin: Secondary | ICD-10-CM | POA: Insufficient documentation

## 2014-12-01 DIAGNOSIS — Z87891 Personal history of nicotine dependence: Secondary | ICD-10-CM | POA: Insufficient documentation

## 2014-12-01 HISTORY — PX: COLONOSCOPY WITH PROPOFOL: SHX5780

## 2014-12-01 LAB — GLUCOSE, CAPILLARY: GLUCOSE-CAPILLARY: 111 mg/dL — AB (ref 65–99)

## 2014-12-01 SURGERY — COLONOSCOPY WITH PROPOFOL
Anesthesia: Monitor Anesthesia Care

## 2014-12-01 MED ORDER — SODIUM CHLORIDE 0.9 % IV SOLN: INTRAVENOUS | Status: DC

## 2014-12-01 MED ORDER — PROPOFOL 10 MG/ML IV BOLUS
INTRAVENOUS | Status: AC
  Filled 2014-12-01: qty 20

## 2014-12-01 MED ORDER — LACTATED RINGERS IV SOLN
INTRAVENOUS | Status: DC
Start: 2014-12-01 — End: 2014-12-01
  Administered 2014-12-01: 1000 mL via INTRAVENOUS

## 2014-12-01 MED ORDER — PROPOFOL 500 MG/50ML IV EMUL
INTRAVENOUS | Status: DC | PRN
  Administered 2014-12-01 (×4): 30 mg via INTRAVENOUS

## 2014-12-01 MED ORDER — PROPOFOL INFUSION 10 MG/ML OPTIME
INTRAVENOUS | Status: DC | PRN
  Administered 2014-12-01: 75 ug/kg/min via INTRAVENOUS

## 2014-12-01 SURGICAL SUPPLY — 21 items

## 2014-12-01 NOTE — Discharge Instructions (Signed)
Conscious Sedation, Adult, Care After Refer to this sheet in the next few weeks. These instructions provide you with information on caring for yourself after your procedure. Your health care provider may also give you more specific instructions. Your treatment has been planned according to current medical practices, but problems sometimes occur. Call your health care provider if you have any problems or questions after your procedure. WHAT TO EXPECT AFTER THE PROCEDURE  After your procedure:  You may feel sleepy, clumsy, and have poor balance for several hours.  Vomiting may occur if you eat too soon after the procedure. HOME CARE INSTRUCTIONS  Do not participate in any activities where you could become injured for at least 24 hours. Do not:  Drive.  Swim.  Ride a bicycle.  Operate heavy machinery.  Cook.  Use power tools.  Climb ladders.  Work from a high place.  Do not make important decisions or sign legal documents until you are improved.  If you vomit, drink water, juice, or soup when you can drink without vomiting. Make sure you have little or no nausea before eating solid foods.  Only take over-the-counter or prescription medicines for pain, discomfort, or fever as directed by your health care provider.  Make sure you and your family fully understand everything about the medicines given to you, including what side effects may occur.  You should not drink alcohol, take sleeping pills, or take medicines that cause drowsiness for at least 24 hours.  If you smoke, do not smoke without supervision.  If you are feeling better, you may resume normal activities 24 hours after you were sedated.  Keep all appointments with your health care provider. SEEK MEDICAL CARE IF:  Your skin is pale or bluish in color.  You continue to feel nauseous or vomit.  Your pain is getting worse and is not helped by medicine.  You have bleeding or swelling.  You are still sleepy or  feeling clumsy after 24 hours. SEEK IMMEDIATE MEDICAL CARE IF:  You develop a rash.  You have difficulty breathing.  You develop any type of allergic problem.  You have a fever. MAKE SURE YOU:  Understand these instructions.  Will watch your condition.  Will get help right away if you are not doing well or get worse. Document Released: 01/20/2013 Document Reviewed: 01/20/2013 [facility] Patient Information 2015 [place], [place]. This information is not intended to replace advice given to you by your health care provider. Make sure you discuss any questions you have with your health care provider.  Colonoscopy, Care After Refer to this sheet in the next few weeks. These instructions provide you with information on caring for yourself after your procedure. Your health care provider may also give you more specific instructions. Your treatment has been planned according to current medical practices, but problems sometimes occur. Call your health care provider if you have any problems or questions after your procedure. WHAT TO EXPECT AFTER THE PROCEDURE  After your procedure, it is typical to have the following:  A small amount of blood in your stool.  Moderate amounts of gas and mild abdominal cramping or bloating. HOME CARE INSTRUCTIONS  Do not drive, operate machinery, or sign important documents for 24 hours.  You may shower and resume your regular physical activities, but move at a slower pace for the first 24 hours.  Take frequent rest periods for the first 24 hours.  Walk around or put a warm pack on your abdomen to help reduce abdominal  cramping and bloating.  Drink enough fluids to keep your urine clear or pale yellow.  You may resume your normal diet as instructed by your health care provider. Avoid heavy or fried foods that are hard to digest.  Avoid drinking alcohol for 24 hours or as instructed by your health care provider.  Only take over-the-counter or prescription  medicines as directed by your health care provider.  If a tissue sample (biopsy) was taken during your procedure:  Do not take aspirin or blood thinners for 7 days, or as instructed by your health care provider.  Do not drink alcohol for 7 days, or as instructed by your health care provider.  Eat soft foods for the first 24 hours. SEEK MEDICAL CARE IF: You have persistent spotting of blood in your stool 2-3 days after the procedure. SEEK IMMEDIATE MEDICAL CARE IF:  You have more than a small spotting of blood in your stool.  You pass large blood clots in your stool.  Your abdomen is swollen (distended).  You have nausea or vomiting.  You have a fever.  You have increasing abdominal pain that is not relieved with medicine. Document Released: 11/14/2003 Document Revised: 01/20/2013 Document Reviewed: 12/07/2012 [facility] Patient Information 2015 [place], [place]. This information is not intended to replace advice given to you by your health care provider. Make sure you discuss any questions you have with your health care provider.

## 2014-12-01 NOTE — Interval H&P Note (Signed)
History and Physical Interval Note:  12/01/2014 9:33 AM  Justin Houston  has presented today for surgery, with the diagnosis of screening colonoscopy  The various methods of treatment have been discussed with the patient and family. After consideration of risks, benefits and other options for treatment, the patient has consented to  Procedure(s): COLONOSCOPY WITH PROPOFOL (N/A) as a surgical intervention .  The patient's history has been reviewed, patient examined, no change in status, stable for surgery.  I have reviewed the patient's chart and labs.  Questions were answered to the patient's satisfaction.     [name]

## 2014-12-01 NOTE — H&P (View-Only) (Signed)
HPI :  Patient is a pleasant 49 year old male referred by PCP for colon cancer screening. He is morbidly obese with BMI greater than 50. Patient has no family history colon cancer. He has no GI complaints such as abdominal pain, bowel changes, or rectal bleeding.   Past Medical History  Diagnosis Date  . Obesity   . Lipoma of scalp   . Diabetes mellitus   . Hypertension   . Hyperlipidemia   . GERD (gastroesophageal reflux disease)   . Dermatitis     Family History  Problem Relation Age of Onset  . Stroke Mother   . Hypertension Mother   . Diabetes Other   . Diabetes Maternal Grandmother   . Prostate cancer Maternal Grandfather    History  Substance Use Topics  . Smoking status: Former Smoker    Quit date: 09/05/1994  . Smokeless tobacco: Never Used  . Alcohol Use: No   Current Outpatient Prescriptions  Medication Sig Dispense Refill  . amLODipine (NORVASC) 10 MG tablet Take 1 tablet (10 mg total) by mouth daily. 90 tablet 0  . atenolol-chlorthalidone (TENORETIC) 50-25 MG per tablet TAKE 1 TABLET BY MOUTH DAILY. 90 tablet 0  . atorvastatin (LIPITOR) 40 MG tablet Take 1 tablet (40 mg total) by mouth daily. 90 tablet 3  . canagliflozin (INVOKANA) 100 MG TABS tablet Take 1 tablet (100 mg total) by mouth daily. 90 tablet 3  . clobetasol ointment (TEMOVATE) 0.05 % Apply topically 3 (three) times daily as needed. 60 g 1  . FREESTYLE INSULINX TEST test strip CHECK BLOOD SUGAR 3 TIMES DAILY 100 each 2  . glipiZIDE (GLUCOTROL) 10 MG tablet Take 1 tablet (10 mg total) by mouth 2 (two) times daily before a meal. 180 tablet 3  . insulin glargine (LANTUS) 100 UNIT/ML injection Inject 0.92 mLs (92 Units total) into the skin at bedtime. 30 mL 12  . Insulin Syringe-Needle U-100 (B-D INS SYR ULTRAFINE 1CC/31G) 31G X 5/16" 1 ML MISC USE AS DIRECTED ONCE A DAY WITH LANTUS 100 each 0  . metFORMIN (GLUCOPHAGE) 1000 MG tablet Take 1 tablet (1,000 mg total) by mouth 2 (two) times daily with a  meal. 60 tablet 2  . ranitidine (ZANTAC) 75 MG tablet Take 1 tablet (75 mg total) by mouth 2 (two) times daily. 180 tablet 3   No current facility-administered medications for this visit.   Allergies  Allergen Reactions  . Losartan Potassium-Hctz Hives     Review of Systems: All systems reviewed and negative except where noted in HPI.   Physical Exam: BP 162/90 mmHg  Pulse 84  Ht 5' 11.5" (1.816 m)  Wt 374 lb 3.2 oz (169.736 kg)  BMI 51.47 kg/m2 Constitutional: Pleasant, morbidly obese black male in no acute distress. HEENT: Normocephalic and atraumatic. Lost lipoma behind right ear.  Neck supple.  Cardiovascular: Normal rate, regular rhythm.  Pulmonary/chest: Effort normal and breath sounds normal. No wheezing, rales or rhonchi. Neurological: Alert and oriented to person place and time. Psychiatric: Normal mood and affect. Behavior is normal.   ASSESSMENT AND PLAN:  1. Pleasant, morbidly obese male for colon cancer screening. BMI greater than 50. Patient has a family history colon cancer, no GI complaints. Patient does understand that he is at increased risk for procedures given morbid obesity. He agrees to proceed with colonoscopy for cancer screening. Procedure will need to be done at the hospital given BMI. The risks, benefits, and alternatives to colonoscopy with possible biopsy and possible polypectomy were  discussed with the patient and he consents to proceed.   2. DM / HTN   Cc: Harrison Mons, PA-C

## 2014-12-01 NOTE — Anesthesia Postprocedure Evaluation (Signed)
  Anesthesia Post-op Note  Patient: Justin Houston  Procedure(s) Performed: Procedure(s): COLONOSCOPY WITH PROPOFOL (N/A)  Patient Location: PACU  Anesthesia Type:MAC  Level of Consciousness: awake, alert , oriented and patient cooperative  Airway and Oxygen Therapy: Patient Spontanous Breathing  Post-op Pain: none  Post-op Assessment: Post-op Vital signs reviewed, Patient's Cardiovascular Status Stable, Respiratory Function Stable, Patent Airway, No signs of Nausea or vomiting and Pain level controlled              Post-op Vital Signs: Reviewed and stable  Last Vitals:  Filed Vitals:   12/01/14 0948  BP: 144/89  Pulse: 69  Temp: 36.7 C  Resp: 21    Complications: No apparent anesthesia complications

## 2014-12-01 NOTE — Op Note (Signed)
[facility] [place], [id number]   COLONOSCOPY PROCEDURE REPORT  PATIENT: Justin, Houston  MR#: [id number] BIRTHDATE: [birth year] , 49  yrs. old GENDER: male ENDOSCOPIST: [name], MD REFERRED OM:[facility] Dellis Filbert, [place] PROCEDURE DATE:  12/01/2014 PROCEDURE:   Colonoscopy, screening and Colonoscopy with snare polypectomy First Screening Colonoscopy - Avg.  risk and is 50 yrs.  old or older Yes.  Prior Negative Screening - Now for repeat screening. N/A  History of Adenoma - Now for follow-up colonoscopy & has been > or = to 3 yrs.  N/A  Polyps removed today? Yes ASA CLASS:   Class III (done at [facility] [place] [facility] for BMI > 50) INDICATIONS:Screening for colonic neoplasia and Colorectal Neoplasm Risk Assessment for this procedure is average risk. MEDICATIONS: Monitored anesthesia care  DESCRIPTION OF PROCEDURE:   After the risks benefits and alternatives of the procedure were thoroughly explained, informed consent was obtained.  The digital rectal exam revealed no abnormalities of the rectum.   The EC-[id number] ([id number])  endoscope was introduced through the anus and advanced to the cecum, which was identified by both the appendix and ileocecal valve. No adverse events experienced.   The quality of the prep was excellent.  The instrument was then slowly withdrawn as the colon was fully examined. Estimated blood loss is zero unless otherwise noted in this procedure report.   COLON FINDINGS: A sessile polyp measuring 4 mm in size was found in the transverse colon.  A polypectomy was performed with a cold snare.  The resection was complete, the polyp tissue was completely retrieved and sent to histology.   The examination was otherwise normal.  Retroflexed views revealed no abnormalities. The time to cecum = 2.7 Withdrawal time = 8.6   The scope was withdrawn and the procedure completed. COMPLICATIONS: There were no immediate complications.  ENDOSCOPIC  IMPRESSION: 1.   Sessile polyp was found in the transverse colon; polypectomy was performed with a cold snare 2.   The examination was otherwise normal  RECOMMENDATIONS: If the polyp(s) removed today are proven to be adenomatous (pre-cancerous) polyps, you will need a repeat colonoscopy in 5 years.  Otherwise you should continue to follow colorectal cancer screening guidelines for "routine risk" patients with colonoscopy in 10 years.  You will receive a letter within 1-2 weeks with the results of your biopsy as well as final recommendations.  Please call my office if you have not received a letter after 3 weeks.  eSigned:  [name], MD 12/01/2014 10:44 AM

## 2014-12-01 NOTE — Transfer of Care (Signed)
Immediate Anesthesia Transfer of Care Note  Patient: Justin Houston  Procedure(s) Performed: Procedure(s): COLONOSCOPY WITH PROPOFOL (N/A)  Patient Location: PACU  Anesthesia Type:MAC  Level of Consciousness: sedated, patient cooperative and responds to stimulation  Airway & Oxygen Therapy: Patient Spontanous Breathing and Patient connected to face mask oxygen  Post-op Assessment: Report given to RN and Post -op Vital signs reviewed and stable  Post vital signs: Reviewed and stable  Last Vitals:  Filed Vitals:   12/01/14 0948  BP: 144/89  Pulse: 69  Temp: 36.7 C  Resp: 21    Complications: No apparent anesthesia complications

## 2014-12-02 ENCOUNTER — Encounter (HOSPITAL_COMMUNITY): Payer: Self-pay | Admitting: Gastroenterology

## 2014-12-08 ENCOUNTER — Encounter: Payer: Self-pay | Admitting: Physician Assistant

## 2014-12-08 DIAGNOSIS — E11319 Type 2 diabetes mellitus with unspecified diabetic retinopathy without macular edema: Secondary | ICD-10-CM | POA: Insufficient documentation

## 2014-12-08 DIAGNOSIS — E113399 Type 2 diabetes mellitus with moderate nonproliferative diabetic retinopathy without macular edema, unspecified eye: Secondary | ICD-10-CM

## 2014-12-22 ENCOUNTER — Encounter: Payer: Self-pay | Admitting: Physician Assistant

## 2014-12-22 DIAGNOSIS — H409 Unspecified glaucoma: Secondary | ICD-10-CM

## 2015-01-29 ENCOUNTER — Other Ambulatory Visit: Payer: Self-pay | Admitting: Physician Assistant

## 2015-01-30 ENCOUNTER — Other Ambulatory Visit: Payer: Self-pay | Admitting: Physician Assistant

## 2015-01-31 NOTE — Telephone Encounter (Signed)
Pt called and was advised he needs to RTC for f/up for more RFs. He agreed.

## 2015-02-25 ENCOUNTER — Other Ambulatory Visit: Payer: Self-pay | Admitting: Physician Assistant

## 2015-03-04 ENCOUNTER — Ambulatory Visit (INDEPENDENT_AMBULATORY_CARE_PROVIDER_SITE_OTHER): Payer: BC Managed Care – PPO | Admitting: Physician Assistant

## 2015-03-04 VITALS — BP 126/80 | HR 72 | Temp 98.8°F | Resp 16 | Ht 72.0 in | Wt 375.0 lb

## 2015-03-04 DIAGNOSIS — E785 Hyperlipidemia, unspecified: Secondary | ICD-10-CM

## 2015-03-04 DIAGNOSIS — L309 Dermatitis, unspecified: Secondary | ICD-10-CM

## 2015-03-04 DIAGNOSIS — L84 Corns and callosities: Secondary | ICD-10-CM | POA: Diagnosis not present

## 2015-03-04 DIAGNOSIS — Z23 Encounter for immunization: Secondary | ICD-10-CM | POA: Diagnosis not present

## 2015-03-04 DIAGNOSIS — Z114 Encounter for screening for human immunodeficiency virus [HIV]: Secondary | ICD-10-CM | POA: Diagnosis not present

## 2015-03-04 DIAGNOSIS — I10 Essential (primary) hypertension: Secondary | ICD-10-CM | POA: Diagnosis not present

## 2015-03-04 DIAGNOSIS — E119 Type 2 diabetes mellitus without complications: Secondary | ICD-10-CM

## 2015-03-04 DIAGNOSIS — E669 Obesity, unspecified: Secondary | ICD-10-CM | POA: Diagnosis not present

## 2015-03-04 DIAGNOSIS — D170 Benign lipomatous neoplasm of skin and subcutaneous tissue of head, face and neck: Secondary | ICD-10-CM | POA: Diagnosis not present

## 2015-03-04 DIAGNOSIS — E1169 Type 2 diabetes mellitus with other specified complication: Secondary | ICD-10-CM

## 2015-03-04 LAB — HIV ANTIBODY (ROUTINE TESTING W REFLEX): HIV 1&2 Ab, 4th Generation: NONREACTIVE

## 2015-03-04 LAB — COMPREHENSIVE METABOLIC PANEL
ALBUMIN: 4 g/dL (ref 3.6–5.1)
ALT: 15 U/L (ref 9–46)
AST: 19 U/L (ref 10–35)
Alkaline Phosphatase: 95 U/L (ref 40–115)
BILIRUBIN TOTAL: 1.1 mg/dL (ref 0.2–1.2)
BUN: 18 mg/dL (ref 7–25)
CHLORIDE: 101 mmol/L (ref 98–110)
CO2: 29 mmol/L (ref 20–31)
CREATININE: 0.86 mg/dL (ref 0.70–1.33)
Calcium: 9.4 mg/dL (ref 8.6–10.3)
GLUCOSE: 114 mg/dL — AB (ref 65–99)
Potassium: 3.6 mmol/L (ref 3.5–5.3)
SODIUM: 142 mmol/L (ref 135–146)
Total Protein: 7.4 g/dL (ref 6.1–8.1)

## 2015-03-04 LAB — GLUCOSE, POCT (MANUAL RESULT ENTRY): POC Glucose: 113 mg/dl — AB (ref 70–99)

## 2015-03-04 LAB — LIPID PANEL
CHOL/HDL RATIO: 3.2 ratio (ref <=5.0)
CHOLESTEROL: 121 mg/dL — AB (ref 125–200)
HDL: 38 mg/dL — ABNORMAL LOW (ref >=40)
LDL CALC: 69 mg/dL (ref <130)
Triglycerides: 69 mg/dL (ref <150)
VLDL: 14 mg/dL (ref <30)

## 2015-03-04 LAB — HEMOGLOBIN A1C: Hgb A1c MFr Bld: 6.6 % — AB (ref 4.0–6.0)

## 2015-03-04 LAB — POCT GLYCOSYLATED HEMOGLOBIN (HGB A1C): HEMOGLOBIN A1C: 6.6

## 2015-03-04 MED ORDER — ATORVASTATIN CALCIUM 40 MG PO TABS: 40.0000 mg | ORAL_TABLET | Freq: Every day | ORAL | Status: DC

## 2015-03-04 MED ORDER — AMLODIPINE BESYLATE 10 MG PO TABS: 10.0000 mg | ORAL_TABLET | Freq: Every day | ORAL | Status: DC

## 2015-03-04 MED ORDER — CLOBETASOL PROPIONATE 0.05 % EX OINT: 1.0000 "application " | TOPICAL_OINTMENT | Freq: Three times a day (TID) | CUTANEOUS | Status: DC | PRN

## 2015-03-04 MED ORDER — METFORMIN HCL 1000 MG PO TABS: 1000.0000 mg | ORAL_TABLET | Freq: Two times a day (BID) | ORAL | Status: DC

## 2015-03-04 MED ORDER — ATENOLOL-CHLORTHALIDONE 50-25 MG PO TABS: 1.0000 | ORAL_TABLET | Freq: Every morning | ORAL | Status: DC

## 2015-03-04 MED ORDER — "BD INSULIN SYR ULTRAFINE II 31G X 5/16"" 1 ML MISC": Status: DC

## 2015-03-04 MED ORDER — GLIPIZIDE 10 MG PO TABS: ORAL_TABLET | ORAL | Status: DC

## 2015-03-04 MED ORDER — CANAGLIFLOZIN 100 MG PO TABS: 100.0000 mg | ORAL_TABLET | Freq: Every day | ORAL | Status: DC

## 2015-03-04 MED ORDER — INSULIN GLARGINE 100 UNIT/ML ~~LOC~~ SOLN: SUBCUTANEOUS | Status: DC

## 2015-03-04 MED ORDER — RANITIDINE HCL 150 MG PO TABS: 150.0000 mg | ORAL_TABLET | Freq: Every morning | ORAL | Status: DC

## 2015-03-04 NOTE — Patient Instructions (Signed)
Keep up the GREAT work!  Try Glucosamine for the joint pain. You may take Aleve (Naproxen 220 mg) as needed, or daily, for pain in the joints.

## 2015-03-04 NOTE — Progress Notes (Signed)
Patient ID: Justin Houston, male    DOB: Dec 24, 1965, 49 y.o.   MRN: [id number]  PCP: Wynne Dust  Subjective:   Chief Complaint  Patient presents with  . Follow-up    diabetes check up   . Immunizations    flu    HPI Presents for evaluation of diabetes, HTN, hyperlipidemia.  Has eliminated sodas and red meat from his diet. Drinks only water. [facility]. Job requires a lot of walking.  Frequency of home glucose monitoring: not recently Sees a dentist Q6 months (has an appointment coming up), eye specialist annually (recently detected glaucoma, so will be followed every 6 months). Checks feet daily. Is not current with influenza vaccine. Is current with pneumococcal vaccine.  Has joint pains, knees and ankles. Shoulders. Asks for a supplement.  Also a callus on the outside of the LEFT foot. Pared down several months ago. Has appointment with podiatry coming up, but needs it trimmed again before then.  He has a large lipoma on the RIGHT posterior scalp. Was referred to [facility] last year, but couldn't afford the specialty copay. He is interested in referral again now.   Review of Systems  Constitutional: Negative.   HENT: Negative.   Eyes: Negative.   Respiratory: Negative.   Cardiovascular: Negative.   Gastrointestinal: Negative.   Endocrine: Negative.   Genitourinary: Negative.   Musculoskeletal: Positive for arthralgias and gait problem (pain). Negative for myalgias, back pain, joint swelling, neck pain and neck stiffness.  Skin:       Recurrent callous on the LEFT foot is painful with walking  Allergic/Immunologic: Negative.   Neurological: Negative.   Hematological: Negative.   Psychiatric/Behavioral: Negative.        Patient Active Problem List   Diagnosis Date Noted  . Diabetic retinopathy ([place]) 12/08/2014  . Colon cancer screening 11/10/2014  . Morbid obesity ([place]) 11/10/2014  . Umbilical hernia,  incarcerated 09/29/2013  . Mass of head, right postauricular SQ 7x4cm 09/29/2013  . Glaucoma 05/26/2013  . Gout 08/01/2012  . Obesity, morbid, BMI 50 or higher ([place]) 08/01/2012  . Hypertension 07/20/2011  . Diabetes mellitus type 2 in obese ([place]) 07/20/2011  . Hyperlipidemia 07/20/2011     Prior to Admission medications   Medication Sig Start Date End Date Taking? Authorizing Provider  amLODipine (NORVASC) 10 MG tablet Take 1 tablet (10 mg total) by mouth daily. Patient taking differently: Take 10 mg by mouth every morning.  09/19/14  Yes Mancel Bale, PA-C  amLODipine (NORVASC) 10 MG tablet Take 1 tablet (10 mg total) by mouth daily. PATIENT NEEDS OFFICE VISIT FOR ADDITIONAL REFILLS 01/31/15  Yes Iraida Cragin, PA-C  atenolol-chlorthalidone (TENORETIC) 50-25 MG per tablet TAKE 1 TABLET BY MOUTH DAILY. Patient taking differently: TAKE 1 TABLET BY MOUTH EVERY MORNING. 09/19/14  Yes Mancel Bale, PA-C  atenolol-chlorthalidone (TENORETIC) 50-25 MG tablet Take 1 tablet by mouth daily. PATIENT NEEDS OFFICE VISIT FOR ADDITIONAL REFILLS 01/31/15  Yes Klea Nall, PA-C  atorvastatin (LIPITOR) 40 MG tablet Take 1 tablet (40 mg total) by mouth daily. PATIENT NEEDS OFFICE VISIT FOR ADDITIONAL REFILLS 01/31/15  Yes Jacoby Zanni, PA-C  canagliflozin (INVOKANA) 100 MG TABS tablet Take 1 tablet (100 mg total) by mouth daily. 07/22/14  Yes Langley Flatley, PA-C  clobetasol ointment (TEMOVATE) 0.05 % Apply topically 3 (three) times daily as needed. Patient taking differently: Apply 1 application topically 3 (three) times daily as needed (rash.).  07/22/14  Yes  Ayaat Jansma, PA-C  glipiZIDE (GLUCOTROL) 10 MG tablet TAKE 1 TABLET (10 MG TOTAL) BY MOUTH 2 (TWO) TIMES DAILY BEFORE A MEAL  "OFFICE VISIT NEEDED FOR REFILLS" 02/26/15  Yes Tishira R Brewington, PA-C  LANTUS 100 UNIT/ML injection INJECT 0.92 MLS (92 UNITS TOTAL) INTO THE SKIN AT BEDTIME. 01/31/15  Yes Rennie Rouch, PA-C  metFORMIN (GLUCOPHAGE)  1000 MG tablet Take 1 tablet (1,000 mg total) by mouth 2 (two) times daily with a meal. PATIENT NEEDS OFFICE VISIT FOR ADDITIONAL REFILLS 01/31/15  Yes Niana Martorana, PA-C  naproxen sodium (ANAPROX) 220 MG tablet Take 440 mg by mouth 2 (two) times daily as needed (pain).   Yes Historical Provider, MD  ranitidine (ZANTAC) 150 MG tablet Take 150 mg by mouth every morning.   Yes Historical Provider, MD  B-D INS SYR ULTRAFINE 1CC/31G 31G X 5/16" 1 ML MISC  12/19/14   Historical Provider, MD     Allergies  Allergen Reactions  . Losartan Potassium-Hctz Hives       Objective:  Physical Exam  Constitutional: He is oriented to person, place, and time. Vital signs are normal. He appears well-developed and well-nourished. He is active and cooperative. No distress.  BP 126/80 mmHg  Pulse 72  Temp(Src) 98.8 F (37.1 C) (Oral)  Resp 16  Ht 6' (1.829 m)  Wt 375 lb (170.099 kg)  BMI 50.85 kg/m2  SpO2 98%  HENT:  Head: Normocephalic and atraumatic.  Right Ear: Hearing normal.  Left Ear: Hearing normal.  Eyes: Conjunctivae are normal. No scleral icterus.  Neck: Normal range of motion. Neck supple. No thyromegaly present.  Cardiovascular: Normal rate, regular rhythm and normal heart sounds.   Pulses:      Radial pulses are 2+ on the right side, and 2+ on the left side.  Pulmonary/Chest: Effort normal and breath sounds normal.  Lymphadenopathy:       Head (right side): No tonsillar, no preauricular, no posterior auricular and no occipital adenopathy present.       Head (left side): No tonsillar, no preauricular, no posterior auricular and no occipital adenopathy present.    He has no cervical adenopathy.       Right: No supraclavicular adenopathy present.       Left: No supraclavicular adenopathy present.  Neurological: He is alert and oriented to person, place, and time. No sensory deficit.  Skin: Skin is warm, dry and intact. Lesion (large callous on the outer LEFT foot. Pared with 10 blade.)  noted. No rash noted. No cyanosis or erythema. Nails show no clubbing.  Psychiatric: He has a normal mood and affect.    Results for orders placed or performed in visit on 03/04/15  POCT glucose (manual entry)  Result Value Ref Range   POC Glucose 113 (A) 70 - 99 mg/dl  POCT glycosylated hemoglobin (Hb A1C)  Result Value Ref Range   Hemoglobin A1C 6.6           Assessment & Plan:   1. Diabetes mellitus type 2 in obese [facility]) Well controlled. Encouraged him to continue his good work and current treatment. - POCT glucose (manual entry) - POCT glycosylated hemoglobin (Hb A1C) - Comprehensive metabolic panel - metFORMIN (GLUCOPHAGE) 1000 MG tablet; Take 1 tablet (1,000 mg total) by mouth 2 (two) times daily with a meal.   Dispense: 180 tablet; Refill: 3 - insulin glargine (LANTUS) 100 UNIT/ML injection; INJECT 0.92 MLS (92 UNITS TOTAL) INTO THE SKIN AT BEDTIME.  Dispense: 30 mL; Refill: prn -  glipiZIDE (GLUCOTROL) 10 MG tablet; TAKE 1 TABLET (10 MG TOTAL) BY MOUTH 2 (TWO) TIMES DAILY BEFORE A MEAL    Dispense: 60 tablet; Refill: 0 - canagliflozin (INVOKANA) 100 MG TABS tablet; Take 1 tablet (100 mg total) by mouth daily.  Dispense: 90 tablet; Refill: 3 - B-D INS SYR ULTRAFINE 1CC/31G 31G X 5/16" 1 ML MISC; Use as directed  Dispense: 100 each; Refill: 3  2. Essential hypertension Controlled. Continue current treatment. - atenolol-chlorthalidone (TENORETIC) 50-25 MG tablet; Take 1 tablet by mouth every morning.  Dispense: 90 tablet; Refill: 3 - amLODipine (NORVASC) 10 MG tablet; Take 1 tablet (10 mg total) by mouth daily.  Dispense: 90 tablet; Refill: 3  3. Hyperlipidemia Await lab results. - Lipid panel - atorvastatin (LIPITOR) 40 MG tablet; Take 1 tablet (40 mg total) by mouth daily. Dispense: 30 tablet; Refill: 0  4. Obesity, morbid, BMI 50 or higher ([place]) Encouraged continued efforts for weight loss. Down 10 lbs in the past 12 months. His joint pain and other chronic conditions  will improve with weight loss.  5. Foot callus Pared today using 10 blade. Proceed with podiatry visit as planned.  6. Need for influenza vaccination Given today.  7. Screening for HIV (human immunodeficiency virus) - HIV antibody  8. Eczema - clobetasol ointment (TEMOVATE) 0.05 %; Apply 1 application topically 3 (three) times daily as needed (rash.).  Dispense: 60 g; Refill: 1  9. Lipoma of scalp - Ambulatory referral to [place], PA-C Physician Assistant-Certified [facility]

## 2015-03-07 ENCOUNTER — Encounter: Payer: Self-pay | Admitting: Physician Assistant

## 2015-03-14 ENCOUNTER — Telehealth: Payer: Self-pay

## 2015-03-14 NOTE — Telephone Encounter (Signed)
Patient called in wanting to talk to [place] to see if she could give him some ideas of what to take over the counter wise for a head cold since he is a diabetic and on those meds. He doesn't want anything to interact with them.  Can someone pelase give him a call back at [phone]

## 2015-03-15 ENCOUNTER — Encounter: Payer: Self-pay | Admitting: Family Medicine

## 2015-03-15 NOTE — Telephone Encounter (Signed)
He can use guaifenisen (Mucinex) and one of the oral antihistamines (Claritin, Zyrtec, Allegra). Avoid syrups, which contain sugar. Avoid decongestants, as they will raise the blood pressure.

## 2015-03-16 NOTE — Telephone Encounter (Signed)
Left voicemail with message from [place].

## 2015-03-27 ENCOUNTER — Ambulatory Visit: Payer: Self-pay | Admitting: Surgery

## 2015-03-27 NOTE — H&P (Signed)
History of Present Illness Imogene Burn. Ramzy Cappelletti MD; 03/27/2015 12:22 PM) Patient words: cyst on head.  The patient is a 49 year old male who presents with an umbilical hernia. Referred by [facility] Mons for evaluation of scalp lipoma and umbilical hernia  This is a 49 year old male who is super morbidly obese who was referred last year for a mass on his scalp behind his right ear. This was felt to represent a lipoma. He did not have surgery last year because of financial reasons. The mass has been present for several years and has enlarged. It is fairly noticeable. It causes him minimal discomfort. It is difficult to wear sunglasses.  The patient was also noted to have a large umbilical hernia. The hernia is about 4 cm across and appears to be pedunculated. It is partially reducible. This was also recommended for repair but patient declined because of financial reasons. The hernia has been present for several years and has become larger with some associated discomfort.   Other Problems Lars Mage Spillers, CMA; 03/27/2015 10:38 AM) Diabetes Mellitus Gastroesophageal Reflux Disease High blood pressure Hypercholesterolemia  Past Surgical History Lars Mage Spillers, CMA; 03/27/2015 10:38 AM) Colon Polyp Removal - Colonoscopy Colon Removal - Complete  Diagnostic Studies History (Alisha Spillers, CMA; 03/27/2015 10:38 AM) Colonoscopy within last year  Allergies Lars Mage Spillers, CMA; 03/27/2015 10:40 AM) Losartan Potassium-HCTZ *ANTIHYPERTENSIVES*  Medication History (Alisha Spillers, CMA; 03/27/2015 10:42 AM) Atorvastatin Calcium (40MG  Tablet, Oral) Active. Invokana (100MG  Tablet, Oral) Active. Lantus (100UNIT/ML Solution, Subcutaneous) Active. Norvasc (10MG  Tablet, Oral) Active. Atenolol-Chlorthalidone (50-25MG  Tablet, Oral) Active. GlipiZIDE (10MG  Tablet, Oral) Active. MetFORMIN HCl (1000MG  Tablet, Oral) Active. Zantac (150MG  Tablet, Oral) Active. Medications  Reconciled  Social History Illene Regulus, CMA; 03/27/2015 10:38 AM) Caffeine use Coffee. No alcohol use No drug use Tobacco use Former smoker.  Family History Illene Regulus, [place]; 03/27/2015 10:38 AM) Heart Disease Mother. Heart disease in male family member before age 59     Review of Systems Lars Mage Spillers CMA; 03/27/2015 10:38 AM) General Not Present- Appetite Loss, Chills, Fatigue, Fever, Night Sweats, Weight Gain and Weight Loss. Skin Not Present- Change in Wart/Mole, Dryness, Hives, Jaundice, New Lesions, Non-Healing Wounds, Rash and Ulcer. HEENT Not Present- Earache, Hearing Loss, Hoarseness, Nose Bleed, Oral Ulcers, Ringing in the Ears, Seasonal Allergies, Sinus Pain, Sore Throat, Visual Disturbances, Wears glasses/contact lenses and Yellow Eyes. Respiratory Not Present- Bloody sputum, Chronic Cough, Difficulty Breathing, Snoring and Wheezing. Breast Not Present- Breast Mass, Breast Pain, Nipple Discharge and Skin Changes. Cardiovascular Not Present- Chest Pain, Difficulty Breathing Lying Down, Leg Cramps, Palpitations, Rapid Heart Rate, Shortness of Breath and Swelling of Extremities. Gastrointestinal Not Present- Abdominal Pain, Bloating, Bloody Stool, Change in Bowel Habits, Chronic diarrhea, Constipation, Difficulty Swallowing, Excessive gas, Gets full quickly at meals, Hemorrhoids, Indigestion, Nausea, Rectal Pain and Vomiting. Male Genitourinary Not Present- Blood in Urine, Change in Urinary Stream, Frequency, Impotence, Nocturia, Painful Urination, Urgency and Urine Leakage. Musculoskeletal Not Present- Back Pain, Joint Pain, Joint Stiffness, Muscle Pain, Muscle Weakness and Swelling of Extremities. Neurological Not Present- Decreased Memory, Fainting, Headaches, Numbness, Seizures, Tingling, Tremor, Trouble walking and Weakness. Psychiatric Not Present- Anxiety, Bipolar, Change in Sleep Pattern, Depression, Fearful and Frequent crying. Endocrine Not Present-  Cold Intolerance, Excessive Hunger, Hair Changes, Heat Intolerance, Hot flashes and New Diabetes. Hematology Not Present- Easy Bruising, Excessive bleeding, Gland problems, HIV and Persistent Infections.  Vitals (Alisha Spillers CMA; 03/27/2015 10:39 AM) 03/27/2015 10:39 AM Weight: 373 lb Height: 71in Body Surface Area: 2.75 m Body Mass Index:  52.02 kg/m  Pulse: 78 (Regular)  BP: 118/76 (Sitting, Left Arm, Standard)      Physical Exam Rodman Key K. Tahirih Lair MD; 03/27/2015 12:26 PM)  The physical exam findings are as follows: Note:WDWN in NAD HEENT: EOMI, sclera anicteric; Right lateral scalp behind right ear - 8 x 4 cm soft elliptical mass; no inflammation or tenderness Neck: No masses, no thyromegaly Lungs: CTA bilaterally; normal respiratory effort CV: Regular rate and rhythm; no murmurs Abd: +bowel sounds, obese, soft, non-tender, 4 cm pedunculated umbilical hernia - partially reducible; no inflammation or infection Ext: Well-perfused; no edema Skin: Warm, dry; no sign of jaundice    Assessment & Plan Rodman Key K. Abril Cappiello MD; 03/27/2015 12:27 PM)  BENIGN LIPOMATOUS NEOPLASM OF SKIN AND SUBCUTANEOUS TISSUE OF HEAD, FACE AND NECK ([id number])  UMBILICAL HERNIA WITHOUT OBSTRUCTION OR GANGRENE (K42.9)  Current Plans Schedule for Surgery - Excision of subcutaneous lipoma - right scalp; umbilical hernia repair with mesh. The surgical procedure has been discussed with the patient. Potential risks, benefits, alternative treatments, and expected outcomes have been explained. All of the patient's questions at this time have been answered. The likelihood of reaching the patient's treatment goal is good. The patient understand the proposed surgical procedure and wishes to proceed.   I explained to the patient that he may develop a seroma underneath his lipoma excision site. Rather than placing a drain in this area right behind his ear, I think it would be easier to just drain the seroma  percutaneously if it develops. We will also need to excise most of the umbilical skin after we repair his hernia.   Imogene Burn. Georgette Dover, MD, [facility] Surgery  General/ Trauma Surgery  03/27/2015 12:27 PM

## 2015-03-29 ENCOUNTER — Encounter: Payer: Self-pay | Admitting: Physician Assistant

## 2015-04-07 ENCOUNTER — Other Ambulatory Visit: Payer: Self-pay | Admitting: Physician Assistant

## 2015-04-14 ENCOUNTER — Other Ambulatory Visit: Payer: Self-pay | Admitting: Physician Assistant

## 2015-05-29 ENCOUNTER — Telehealth: Payer: Self-pay

## 2015-05-29 NOTE — Telephone Encounter (Signed)
Patient's insurance will not cover his insulin glargine (LANTUS) 100 UNIT/ML injection according to the pharmacy and he is completely out of this medicine and would like someone to give him a call and get this figured out, he was very upset about this because he states he needs this medicine and he is out.  His call back number is [phone] and he uses the [facility] on [address].

## 2015-05-30 MED ORDER — INSULIN DETEMIR 100 UNIT/ML ~~LOC~~ SOLN: SUBCUTANEOUS | Status: DC

## 2015-05-30 NOTE — Telephone Encounter (Signed)
Pt's ins plan requires change to Basaglar, Levemir or [place]. Chelle, please advise.

## 2015-05-30 NOTE — Telephone Encounter (Signed)
Called pharm and they reported that pt has been getting the vials of Lantus and Chelle had put RFs on the lantus for a yr (from Nov when Rx was sent). I sent in same of Levemir. Notified pt on VM.

## 2015-05-30 NOTE — Telephone Encounter (Signed)
Let's change to levemir 100 units/mL, same dose as Lantus.  I'm not sure whether he prefers the pen or the vials-whichever is fine with me.

## 2015-06-11 ENCOUNTER — Ambulatory Visit (INDEPENDENT_AMBULATORY_CARE_PROVIDER_SITE_OTHER): Payer: BC Managed Care – PPO | Admitting: Physician Assistant

## 2015-06-11 VITALS — BP 140/70 | HR 97 | Temp 98.5°F | Resp 16 | Ht 74.0 in | Wt 361.0 lb

## 2015-06-11 DIAGNOSIS — Z6841 Body Mass Index (BMI) 40.0 and over, adult: Secondary | ICD-10-CM

## 2015-06-11 DIAGNOSIS — L03314 Cellulitis of groin: Secondary | ICD-10-CM

## 2015-06-11 DIAGNOSIS — L84 Corns and callosities: Secondary | ICD-10-CM

## 2015-06-11 MED ORDER — DOXYCYCLINE HYCLATE 100 MG PO CAPS
100.0000 mg | ORAL_CAPSULE | Freq: Two times a day (BID) | ORAL | Status: AC
Start: 2015-06-11 — End: 2015-06-21

## 2015-06-11 NOTE — Progress Notes (Signed)
Patient ID: Justin Houston, male    DOB: 01-31-1966, 50 y.o.   MRN: [id number]  PCP: Wynne Dust  Subjective:   Chief Complaint  Patient presents with  . Fatigue    yesterday   . Groin Pain    left groin pain 1 week  . Diarrhea    HPI Presents for evaluation of a tender spot in the LEFT groin x 1 week.  I last saw Justin Houston for routine diabetes care in 02/2015, at which time his diabetes and lipids were in excellent control. Justin Houston continues to make lifestyle modifications and lose weight.  1 week ago Justin Houston noted soreness in the LEFT groin while in the shower. No pain with walking, exercise. Pain with sitting. No drainage. Justin Houston had a similar lesion in 2015, and another previous to that, requiring I&D.  Diarrhea yesterday. No BM yet today. Watery. No blood or mucous. Estimates 4-5 stools yesterday. No nausea or vomiting. No recent travel, unusual foods.  This morning awoke feeling tired and draggy, so decided to come in.  Fever and chills the evening on 2/24, but none since. No urinary symptoms. No headache or dizziness.  Sees his eye specialist next week.  Justin Houston asks that I pare a large callous on the LEFT lateral foot.   Review of Systems  Constitutional: Negative for activity change, appetite change, fatigue and unexpected weight change.  HENT: Negative for congestion, dental problem, ear pain, hearing loss, mouth sores, postnasal drip, rhinorrhea, sneezing, sore throat, tinnitus and trouble swallowing.   Eyes: Negative for photophobia, pain, redness and visual disturbance.  Respiratory: Negative for cough, chest tightness and shortness of breath.   Cardiovascular: Negative for chest pain, palpitations and leg swelling.  Gastrointestinal: Negative for nausea, vomiting, abdominal pain, diarrhea, constipation and blood in stool.  Genitourinary: Negative for dysuria, urgency, frequency and hematuria.  Musculoskeletal: Negative for myalgias, arthralgias, gait problem and neck  stiffness.  Skin: Negative for rash.       Tender lump in the LEFT groin, posterior to previous similar episodes.  Neurological: Negative for dizziness, speech difficulty, weakness, light-headedness, numbness and headaches.  Hematological: Negative for adenopathy.  Psychiatric/Behavioral: Negative for confusion and sleep disturbance. The patient is not nervous/anxious.        Patient Active Problem List   Diagnosis Date Noted  . Diabetic retinopathy ([place]) 12/08/2014  . Colon cancer screening 11/10/2014  . Morbid obesity ([place]) 11/10/2014  . Umbilical hernia, incarcerated 09/29/2013  . Mass of head, right postauricular SQ 7x4cm 09/29/2013  . Glaucoma 05/26/2013  . Gout 08/01/2012  . Obesity, morbid, BMI 50 or higher ([place]) 08/01/2012  . Hypertension 07/20/2011  . Diabetes mellitus type 2 in obese ([place]) 07/20/2011  . Hyperlipidemia 07/20/2011     Prior to Admission medications   Medication Sig Start Date End Date Taking? Authorizing Provider  amLODipine (NORVASC) 10 MG tablet Take 1 tablet (10 mg total) by mouth daily. 03/04/15  Yes Malonie Tatum, PA-C  atenolol-chlorthalidone (TENORETIC) 50-25 MG tablet Take 1 tablet by mouth every morning. 03/04/15  Yes Nyellie Yetter, PA-C  B-D INS SYR ULTRAFINE 1CC/31G 31G X 5/16" 1 ML MISC Use as directed 03/04/15  Yes Ethelyne Erich, PA-C  canagliflozin (INVOKANA) 100 MG TABS tablet Take 1 tablet (100 mg total) by mouth daily. 03/04/15  Yes Veleria Barnhardt, PA-C  clobetasol ointment (TEMOVATE) [id number] % Apply 1 application topically 3 (three) times daily as needed (rash.). 03/04/15  Yes Dorethia Jeanmarie, PA-C  glipiZIDE (GLUCOTROL) 10 MG tablet TAKE 1  TABLET (10 MG TOTAL) BY MOUTH 2 (TWO) TIMES DAILY BEFORE A MEAL  "OFFICE VISIT NEEDED FOR REFILLS" 03/04/15  Yes Maahir Horst, PA-C  insulin detemir (LEVEMIR) 100 UNIT/ML injection Inject 92 Units into the skin daily.   Yes Historical Provider, MD  metFORMIN (GLUCOPHAGE) 1000 MG tablet Take 1 tablet  (1,000 mg total) by mouth 2 (two) times daily with a meal. PATIENT NEEDS OFFICE VISIT FOR ADDITIONAL REFILLS 03/04/15  Yes Britania Shreeve, PA-C  naproxen sodium (ANAPROX) 220 MG tablet Take 440 mg by mouth 2 (two) times daily as needed (pain).   Yes Historical Provider, MD  ranitidine (ZANTAC) 150 MG tablet Take 1 tablet (150 mg total) by mouth every morning. 03/04/15  Yes [facility] Mons, PA-C     Allergies  Allergen Reactions  . Losartan Potassium-Hctz Hives       Objective:  Physical Exam  Constitutional: Justin Houston is oriented to person, place, and time. Justin Houston appears well-developed and well-nourished. Justin Houston is active and cooperative. No distress.  BP 140/70 mmHg  Pulse 97  Temp(Src) 98.5 F (36.9 C) (Oral)  Resp 16  Ht 6\' 2"  (1.88 m)  Wt 361 lb (163.749 kg)  BMI 46.33 kg/m2  SpO2 98%  HENT:  Head: Normocephalic and atraumatic.  Right Ear: Hearing normal.  Left Ear: Hearing normal.  Eyes: Conjunctivae are normal. No scleral icterus.  Neck: Normal range of motion. Neck supple. No thyromegaly present.  Cardiovascular: Normal rate, regular rhythm and normal heart sounds.   Pulses:      Radial pulses are 2+ on the right side, and 2+ on the left side.  Pulmonary/Chest: Effort normal and breath sounds normal.  Genitourinary:     Lymphadenopathy:       Head (right side): No tonsillar, no preauricular, no posterior auricular and no occipital adenopathy present.       Head (left side): No tonsillar, no preauricular, no posterior auricular and no occipital adenopathy present.    Justin Houston has no cervical adenopathy.       Right: No supraclavicular adenopathy present.       Left: No supraclavicular adenopathy present.  Neurological: Justin Houston is alert and oriented to person, place, and time. No sensory deficit.  Skin: Skin is warm, dry and intact. Lesion (large callous on the lateral LEFT foot. Pared with 15 blade upon his request, which Justin Houston tolerated well.) noted. No rash noted. No cyanosis or erythema.  Nails show no clubbing.  Psychiatric: Justin Houston has a normal mood and affect. His speech is normal and behavior is normal.           Assessment & Plan:   1. Cellulitis of groin Warm compresses. Doxy. If it worsens, RTC for possible I&D. - doxycycline (VIBRAMYCIN) 100 MG capsule; Take 1 capsule (100 mg total) by mouth 2 (two) times daily.  Dispense: 20 capsule; Refill: 0  2. Foot callus Pared with 15 blade, which Justin Houston reports reduces the discomfort considerably.   Fara Chute, PA-C Physician Assistant-Certified [facility] [facility]

## 2015-06-11 NOTE — Patient Instructions (Signed)
Apply a warm compress to the tender area in the groin.

## 2015-06-17 ENCOUNTER — Ambulatory Visit (INDEPENDENT_AMBULATORY_CARE_PROVIDER_SITE_OTHER): Payer: BC Managed Care – PPO | Admitting: Internal Medicine

## 2015-06-17 VITALS — BP 142/78 | HR 92 | Temp 98.4°F | Resp 16 | Ht 74.0 in | Wt 362.0 lb

## 2015-06-17 DIAGNOSIS — L02214 Cutaneous abscess of groin: Secondary | ICD-10-CM

## 2015-06-17 MED ORDER — CLINDAMYCIN HCL 300 MG PO CAPS: 300.0000 mg | ORAL_CAPSULE | Freq: Four times a day (QID) | ORAL | Status: DC

## 2015-06-17 NOTE — Progress Notes (Signed)
Subjective:  By signing my name below, I, [name], attest that this documentation has been prepared under the direction and in the presence of Tami Lin, MD. Electronically Signed: [name], [place]. 06/17/2015 , 9:15 AM .  Patient was seen in Room 10 .   Patient ID: Justin Houston, male    DOB: 23-Jan-1966, 50 y.o.   MRN: [id number] Chief Complaint  Patient presents with  . Follow-up    groin still swollen and painful, no relief from last visit   HPI Justin Houston is a 50 y.o. male who presents to [facility] for follow up. He notes that his left groin is still swollen and painful without relief from last visit. He feels some soreness in the area even when he walks. He denies history of testicular problems, fever or night sweats.   His sugar is doing well. He also reports losing some weight recently.   Patient Active Problem List   Diagnosis Date Noted  . Diabetic retinopathy ([place]) 12/08/2014  . Colon cancer screening 11/10/2014  . Morbid obesity ([place]) 11/10/2014  . Umbilical hernia, incarcerated 09/29/2013  . Mass of head, right postauricular SQ 7x4cm 09/29/2013  . Glaucoma 05/26/2013  . Gout 08/01/2012  . BMI 45.0-49.9, adult ([place]) 08/01/2012  . Hypertension 07/20/2011  . Diabetes mellitus type 2 in obese ([place]) 07/20/2011  . Hyperlipidemia 07/20/2011    Current outpatient prescriptions:  .  amLODipine (NORVASC) 10 MG tablet, Take 1 tablet (10 mg total) by mouth daily., Disp: 90 tablet, Rfl: 3 .  atenolol-chlorthalidone (TENORETIC) 50-25 MG tablet, Take 1 tablet by mouth every morning., Disp: 90 tablet, Rfl: 3 .  B-D INS SYR ULTRAFINE 1CC/31G 31G X 5/16" 1 ML MISC, Use as directed, Disp: 100 each, Rfl: 3 .  canagliflozin (INVOKANA) 100 MG TABS tablet, Take 1 tablet (100 mg total) by mouth daily., Disp: 90 tablet, Rfl: 3 .  clobetasol ointment (TEMOVATE) AB-123456789 %, Apply 1 application topically 3 (three) times daily as needed (rash.)., Disp: 60 g, Rfl: 1 .   doxycycline (VIBRAMYCIN) 100 MG capsule, Take 1 capsule (100 mg total) by mouth 2 (two) times daily., Disp: 20 capsule, Rfl: 0 .  glipiZIDE (GLUCOTROL) 10 MG tablet, TAKE 1 TABLET (10 MG TOTAL) BY MOUTH 2 (TWO) TIMES DAILY BEFORE A MEAL  "OFFICE VISIT NEEDED FOR REFILLS", Disp: 60 tablet, Rfl: 0 .  insulin detemir (LEVEMIR) 100 UNIT/ML injection, Inject 92 Units into the skin daily., Disp: , Rfl:  .  metFORMIN (GLUCOPHAGE) 1000 MG tablet, Take 1 tablet (1,000 mg total) by mouth 2 (two) times daily with a meal. PATIENT NEEDS OFFICE VISIT FOR ADDITIONAL REFILLS, Disp: 180 tablet, Rfl: 3 .  naproxen sodium (ANAPROX) 220 MG tablet, Take 440 mg by mouth 2 (two) times daily as needed (pain)., Disp: , Rfl:  .  ranitidine (ZANTAC) 150 MG tablet, Take 1 tablet (150 mg total) by mouth every morning., Disp: 90 tablet, Rfl: 3  Review of Systems  Constitutional: Negative for fever, chills, activity change and fatigue.  Gastrointestinal: Negative for nausea, vomiting, abdominal pain, diarrhea and constipation.  Genitourinary: Negative for dysuria, scrotal swelling, genital sores and testicular pain.  Skin: Negative for rash.       Tender lump in the LEFT groin      Objective:   Physical Exam  Constitutional: He is oriented to person, place, and time. He appears well-developed and well-nourished. No distress.  HENT:  Head: Normocephalic and atraumatic.  Eyes: EOM are normal. Pupils are  equal, round, and reactive to light.  Neck: Neck supple.  Cardiovascular: Normal rate.   Pulmonary/Chest: Effort normal. No respiratory distress.  Genitourinary:  A large indurated tender area that starts in the medial left thigh extending into the inguinal area and into the top of the scrotum that measures 7cm in length and 3cm in width, does not involve the left testicle or the epididymus  Musculoskeletal: Normal range of motion.  Neurological: He is alert and oriented to person, place, and time.  Skin: Skin is warm and  dry.  Psychiatric: He has a normal mood and affect. His behavior is normal.  Nursing note and vitals reviewed.  BP 142/78 mmHg  Pulse 92  Temp(Src) 98.4 F (36.9 C) (Oral)  Resp 16  Ht 6\' 2"  (1.88 m)  Wt 362 lb (164.202 kg)  BMI 46.46 kg/m2  SpO2 99%   Procedure: 5cc 2% xylocaine, opened 1.5cm incision, just below the inguinal line and drained copious amount of pus until indurated area was almost entirely deflated, packed with iodoform cultured    Assessment & Plan:  I have completed the patient encounter in its entirety as documented by the scribe, with editing by me where necessary. [place] Jar[place] P. Laney Pastor, M.D.   Abscess of groin, left - Plan: Wound culture  Meds ordered this encounter  Medications  . clindamycin (CLEOCIN) 300 MG capsule    Sig: Take 1 capsule (300 mg total) by mouth 4 (four) times daily.    Dispense:  40 capsule    Refill:  0   Hot soaks bid Reck 48h

## 2015-06-17 NOTE — Patient Instructions (Signed)

## 2015-06-19 ENCOUNTER — Ambulatory Visit (INDEPENDENT_AMBULATORY_CARE_PROVIDER_SITE_OTHER): Payer: BC Managed Care – PPO | Admitting: Internal Medicine

## 2015-06-19 VITALS — BP 138/74 | HR 68 | Temp 98.4°F | Resp 18 | Ht 72.0 in | Wt 359.6 lb

## 2015-06-19 DIAGNOSIS — L02214 Cutaneous abscess of groin: Secondary | ICD-10-CM

## 2015-06-19 LAB — WOUND CULTURE

## 2015-06-19 MED ORDER — CANAGLIFLOZIN 100 MG PO TABS: 100.0000 mg | ORAL_TABLET | Freq: Every day | ORAL | Status: DC

## 2015-06-19 NOTE — Progress Notes (Signed)
   Subjective:  By signing my name below, I, Justin Houston, attest that this documentation has been prepared under the direction and in the presence of Tami Lin, MD. Electronically Signed: Moises Houston, [place]. 06/19/2015 , 3:51 PM .  Patient was seen in Room 10 .   Patient ID: Justin Houston, male    DOB: [birth year], 50 y.o.   MRN: [id number] Chief Complaint  Patient presents with  . Wound Check   HPI Justin Houston is a 50 y.o. male who presents to [facility] for follow up of an abscess in his left groin. During his initial visit 2 days ago, I&D was performed and Abscess drained. He was given clindamycin. His culture grew strep.   He changed the dressing yesterday. He denies anymore pain in the area.    Review of Systems  Constitutional: Negative for fever and chills.  Genitourinary: Negative for difficulty urinating and testicular pain.      Objective:   Physical Exam  Constitutional: He appears well-developed and well-nourished. No distress.  Nursing note and vitals reviewed.  BP 138/74 mmHg  Pulse 68  Temp(Src) 98.4 F (36.9 C) (Oral)  Resp 18  Ht 6' (1.829 m)  Wt 359 lb 9.6 oz (163.113 kg)  BMI 48.76 kg/m2  SpO2 97%    The left inguinal area no longer has induration or redness in the incision is open. No pus is expressible after the packing is removed.  Assessment & Plan:  Follow-up for inguinal abscess--greatly improved  Continue clindamycin for full 10 day course Continue hot soaks for another 3 days Follow-up only if relapses other than for routine care We will start the prior authorization process for invokana yet again

## 2015-06-20 ENCOUNTER — Telehealth: Payer: Self-pay

## 2015-06-20 NOTE — Progress Notes (Signed)
I checked pt's formulary and it appears that the preferred medication in this class is Jardiance. I don't see that he has tried that in the past. Do you want to change Invokana to Jardiance?

## 2015-06-20 NOTE — Telephone Encounter (Signed)
Pt's ins will not cover invokana, they prefer Jardiance. I had sent message in chart notes to Dr Laney Pastor who responded: " Yes, let's try Jardiance         ----- Message -----     From: Leandrew Koyanagi, MD     Sent: 06/19/2015 10:53 PM      To: Harrison Mons, PA-C        FYI      Dr Laney Pastor, I do not know which strength/sig you want to Rx. Please advise, or send in new Rx. Thank you!

## 2015-06-20 NOTE — Telephone Encounter (Signed)
PA is needed for Invokana, but it will not be approved unless pt has failed the preferred alternatives which are [place]. Chelle, do you want to Rx on of these? I can leave a savings card for [place] for pt at desk if you Rx it.

## 2015-06-20 NOTE — Telephone Encounter (Signed)
Let's go with [place] and the savings card. Thank you!

## 2015-06-22 MED ORDER — DAPAGLIFLOZIN PROPANEDIOL 5 MG PO TABS: 5.0000 mg | ORAL_TABLET | Freq: Every day | ORAL | Status: DC

## 2015-06-22 NOTE — Telephone Encounter (Signed)
Meds ordered this encounter  Medications  . dapagliflozin propanediol (FARXIGA) 5 MG TABS tablet    Sig: Take 5 mg by mouth daily.    Dispense:  90 tablet    Refill:  3    Order Specific Question:  Supervising Provider    Answer:  DOOLITTLE, ROBERT P [[id number]]    

## 2015-06-22 NOTE — Telephone Encounter (Signed)
Chelle, which strength 5 or 10 mg? 1 tab daily correct?

## 2015-06-22 NOTE — Telephone Encounter (Signed)
Chelle had already responded to use [place] w/savings card which should work for pt.

## 2015-06-26 NOTE — Telephone Encounter (Signed)
Left VM informing pt rx was sent in on 39/17.

## 2015-07-01 ENCOUNTER — Other Ambulatory Visit: Payer: Self-pay | Admitting: Physician Assistant

## 2015-10-26 ENCOUNTER — Other Ambulatory Visit: Payer: Self-pay | Admitting: Physician Assistant

## 2015-11-02 ENCOUNTER — Telehealth: Payer: Self-pay

## 2015-11-02 MED ORDER — ATORVASTATIN CALCIUM 40 MG PO TABS: ORAL_TABLET | ORAL | Status: DC

## 2015-11-02 NOTE — Telephone Encounter (Signed)
RFd x 1 w/ note needs f/up.

## 2015-11-15 ENCOUNTER — Telehealth: Payer: Self-pay

## 2015-11-15 ENCOUNTER — Ambulatory Visit: Payer: BC Managed Care – PPO

## 2015-11-15 MED ORDER — INSULIN DETEMIR 100 UNIT/ML ~~LOC~~ SOLN: 92.0000 [IU] | Freq: Every day | SUBCUTANEOUS | 3 refills | Status: DC

## 2015-11-15 MED ORDER — GLIPIZIDE 10 MG PO TABS: ORAL_TABLET | ORAL | 0 refills | Status: DC

## 2015-11-15 MED ORDER — ATORVASTATIN CALCIUM 40 MG PO TABS: ORAL_TABLET | ORAL | 0 refills | Status: DC

## 2015-11-15 NOTE — Telephone Encounter (Signed)
Pt advised.

## 2015-11-15 NOTE — Telephone Encounter (Signed)
Patient came in for an appointment with Justin Houston for medication refill and is unable to pay $115 balance. Patient is going to set up [employer] for assistance and call billing. Patient would like to know if we can refill these medication until he can be seen: amlodipine, atenolol, atorvastatin, glipizide, and insulin detemir solution  [name] on [employer]

## 2015-11-15 NOTE — Telephone Encounter (Signed)
Amlodipine, atenolol-chlorthalidone, metformin written for 90-days and 3 refills 02/2015. He should be fine with those.  Meds ordered this encounter  Medications  . atorvastatin (LIPITOR) 40 MG tablet    Sig: TAKE 1 TABLET (40 MG TOTAL) BY MOUTH DAILY    Dispense:  90 tablet    Refill:  0    PATIENT NEEDS OFFICE VISIT FOR ADDITIONAL REFILLS    Order Specific Question:   Supervising Provider    Answer:   Brigitte Pulse, EVA N [[id number]]  . insulin detemir (LEVEMIR) 100 UNIT/ML injection    Sig: Inject 0.92 mLs (92 Units total) into the skin daily.    Dispense:  30 mL    Refill:  3    Order Specific Question:   Supervising Provider    Answer:   SHAW, EVA N [[id number]]  . glipiZIDE (GLUCOTROL) 10 MG tablet    Sig: TAKE 1 TABLET (10 MG TOTAL) BY MOUTH 2 (TWO) TIMES DAILY BEFORE A MEAL**OFFICE VISIT NEEDED FOR REFILLS**    Dispense:  180 tablet    Refill:  0    No refills available    Order Specific Question:   Supervising Provider    Answer:   Brigitte Pulse, EVA N [[id number]]

## 2015-11-15 NOTE — Telephone Encounter (Signed)
Ok to refill 

## 2015-12-27 ENCOUNTER — Telehealth: Payer: Self-pay

## 2015-12-27 NOTE — Telephone Encounter (Signed)
Atenolol-chlorthalidone 50-25 is back ordered please send a new fax for an alternative

## 2015-12-27 NOTE — Telephone Encounter (Signed)
Is it just the combination product that is back ordered?  If so, let's substitute with the 2 individual drugs: Atenolol 50 mg 1 PO QD and Chlorthalidone 25 mg 1 PO QD #90 of each.

## 2015-12-29 ENCOUNTER — Other Ambulatory Visit: Payer: Self-pay

## 2015-12-29 MED ORDER — ATENOLOL 50 MG PO TABS: 50.0000 mg | ORAL_TABLET | Freq: Every day | ORAL | 3 refills | Status: DC

## 2016-01-09 NOTE — Telephone Encounter (Signed)
Chelle, we got another notice from pharmacy that all strengths and forms of atenolol are on national back order and request change to an alternative such as metoprolol.

## 2016-01-10 MED ORDER — METOPROLOL SUCCINATE ER 50 MG PO TB24
50.0000 mg | ORAL_TABLET | Freq: Every day | ORAL | 3 refills | Status: DC
Start: 2016-01-10 — End: 2016-08-16

## 2016-01-10 MED ORDER — HYDROCHLOROTHIAZIDE 25 MG PO TABS
25.0000 mg | ORAL_TABLET | Freq: Every day | ORAL | 3 refills | Status: DC
Start: 2016-01-10 — End: 2016-08-16

## 2016-01-10 NOTE — Telephone Encounter (Signed)
Meds ordered this encounter  Medications  . metoprolol succinate (TOPROL-XL) 50 MG 24 hr tablet    Sig: Take 1 tablet (50 mg total) by mouth daily. Take with or immediately following a meal.    Dispense:  90 tablet    Refill:  3    Order Specific Question:   Supervising Provider    Answer:   Brigitte Pulse, EVA N [[id number]]  . hydrochlorothiazide (HYDRODIURIL) 25 MG tablet    Sig: Take 1 tablet (25 mg total) by mouth daily.    Dispense:  90 tablet    Refill:  3    Order Specific Question:   Supervising Provider    Answer:   Brigitte Pulse, EVA N [[id number]]

## 2016-01-16 ENCOUNTER — Ambulatory Visit (INDEPENDENT_AMBULATORY_CARE_PROVIDER_SITE_OTHER): Payer: BC Managed Care – PPO | Admitting: Physician Assistant

## 2016-01-16 ENCOUNTER — Encounter: Payer: Self-pay | Admitting: Physician Assistant

## 2016-01-16 VITALS — BP 168/92 | HR 79 | Temp 98.2°F | Resp 16 | Wt 367.0 lb

## 2016-01-16 DIAGNOSIS — Z23 Encounter for immunization: Secondary | ICD-10-CM

## 2016-01-16 DIAGNOSIS — E1169 Type 2 diabetes mellitus with other specified complication: Secondary | ICD-10-CM

## 2016-01-16 DIAGNOSIS — I10 Essential (primary) hypertension: Secondary | ICD-10-CM | POA: Diagnosis not present

## 2016-01-16 DIAGNOSIS — Z6841 Body Mass Index (BMI) 40.0 and over, adult: Secondary | ICD-10-CM

## 2016-01-16 DIAGNOSIS — E785 Hyperlipidemia, unspecified: Secondary | ICD-10-CM | POA: Diagnosis not present

## 2016-01-16 DIAGNOSIS — E669 Obesity, unspecified: Secondary | ICD-10-CM | POA: Diagnosis not present

## 2016-01-16 LAB — CBC WITH DIFFERENTIAL/PLATELET
BASOS PCT: 0 %
Basophils Absolute: 0 cells/uL (ref 0–200)
EOS ABS: 60 {cells}/uL (ref 15–500)
EOS PCT: 1 %
HCT: 43.6 % (ref 38.5–50.0)
Hemoglobin: 15 g/dL (ref 13.2–17.1)
LYMPHS PCT: 35 %
Lymphs Abs: 2100 cells/uL (ref 850–3900)
MCH: 30.2 pg (ref 27.0–33.0)
MCHC: 34.4 g/dL (ref 32.0–36.0)
MCV: 87.7 fL (ref 80.0–100.0)
MONOS PCT: 7 %
MPV: 10.8 fL (ref 7.5–12.5)
Monocytes Absolute: 420 cells/uL (ref 200–950)
NEUTROS ABS: 3420 {cells}/uL (ref 1500–7800)
Neutrophils Relative %: 57 %
PLATELETS: 201 10*3/uL (ref 140–400)
RBC: 4.97 MIL/uL (ref 4.20–5.80)
RDW: 14 % (ref 11.0–15.0)
WBC: 6 10*3/uL (ref 3.8–10.8)

## 2016-01-16 MED ORDER — CHLORTHALIDONE 25 MG PO TABS: 25.0000 mg | ORAL_TABLET | Freq: Every day | ORAL | 3 refills | Status: DC

## 2016-01-16 NOTE — Patient Instructions (Addendum)
1. Continue the atenolol until you use it up. 2. THEN switch to the metoprolol succinate. 3. Start the hydrochorothiazide (HCTZ).  The metoprolol and the hydrochlorothiazide are at the pharmacy now.  Please schedule an eye exam. If you don't already have an eye doctor, I recommend:  [facility]  [address] [place], [place] [address]  Phone: [phone]  [facility], [place], [place] [address]  Phone: [phone]  [place]. [address] [place], [place] [address]  Phone: [phone]      IF you received an x-ray today, you will receive an invoice from [facility]. Please contact [facility] at [phone] with questions or concerns regarding your invoice.   IF you received labwork today, you will receive an invoice from [employer]. Please contact [facility] at [phone] with questions or concerns regarding your invoice.   Our billing staff will not be able to assist you with questions regarding bills from these companies.  You will be contacted with the lab results as soon as they are available. The fastest way to get your results is to activate your My Chart account. Instructions are located on the last page of this paperwork. If you have not heard from [place] regarding the results in 2 weeks, please contact this office.

## 2016-01-16 NOTE — Progress Notes (Signed)
Patient ID: Justin Houston, male    DOB: Dec 02, 1965, 50 y.o.   MRN: [id number]  PCP: Harrison Mons, PA-C  Subjective:   Chief Complaint  Patient presents with  . Follow-up    diabetes  . Medication Check    HPI Presents for evaluation of diabetes.  Overall, he feels like he's doing well. He's had a recurrence of the thick callous on the outer part of the LEFT foot that he'd like pared again. It causes pain inside his shoe.  Tolerating his medications well, without problems. We recently heard from his pharmacy that Tenoretic was on manufacturer's back order, and so switched him to the two components. He picked up the atenolol, but not the chlorthalidone. Then we were notified that there was a back order on the atenolol and the chlorthalidone, and switched him to metoprolol succinate and HCTZ. He has not picked those up.  Denies chest pain, shortness of breath, HA, dizziness, vision change, nausea, vomiting, diarrhea, constipation, melena, hematochezia, dysuria, increased urinary urgency or frequency, increased hunger or thirst, unintentional weight change, unexplained myalgias or arthralgias, rash.     Review of Systems As above.    Patient Active Problem List   Diagnosis Date Noted  . Diabetic retinopathy ([place]) 12/08/2014  . Colon cancer screening 11/10/2014  . Morbid obesity ([place]) 11/10/2014  . Umbilical hernia, incarcerated 09/29/2013  . Mass of head, right postauricular SQ 7x4cm 09/29/2013  . Glaucoma 05/26/2013  . Gout 08/01/2012  . BMI 45.0-49.9, adult ([place]) 08/01/2012  . Hypertension 07/20/2011  . Diabetes mellitus type 2 in obese ([place]) 07/20/2011  . Hyperlipidemia 07/20/2011     Prior to Admission medications   Medication Sig Start Date End Date Taking? Authorizing Provider  amLODipine (NORVASC) 10 MG tablet Take 1 tablet (10 mg total) by mouth daily. 03/04/15  Yes Huber Mathers, PA-C  atenolol (TENORMIN) 50 MG tablet Take 1 tablet (50 mg total) by  mouth daily. 12/29/15  Yes Ophelia Sipe, PA-C  atorvastatin (LIPITOR) 40 MG tablet TAKE 1 TABLET (40 MG TOTAL) BY MOUTH DAILY 11/15/15  Yes Duriel Deery, PA-C  B-D INS SYR ULTRAFINE 1CC/31G 31G X 5/16" 1 ML MISC Use as directed 03/04/15  Yes Alger Kerstein, PA-C  clindamycin (CLEOCIN) 300 MG capsule Take 1 capsule (300 mg total) by mouth 4 (four) times daily. 06/17/15  Yes Leandrew Koyanagi, MD  clobetasol ointment (TEMOVATE) [id number] % Apply 1 application topically 3 (three) times daily as needed (rash.). 03/04/15  Yes Tandrea Kommer, PA-C  dapagliflozin propanediol (FARXIGA) 5 MG TABS tablet Take 5 mg by mouth daily. 06/22/15  Yes Frimet Durfee, PA-C  glipiZIDE (GLUCOTROL) 10 MG tablet TAKE 1 TABLET (10 MG TOTAL) BY MOUTH 2 (TWO) TIMES DAILY BEFORE A MEAL**OFFICE VISIT NEEDED FOR REFILLS** 11/15/15  Yes Sangeeta Youse, PA-C  insulin detemir (LEVEMIR) 100 UNIT/ML injection Inject 0.92 mLs (92 Units total) into the skin daily. 11/15/15  Yes Ruhani Umland, PA-C  metFORMIN (GLUCOPHAGE) 1000 MG tablet Take 1 tablet (1,000 mg total) by mouth 2 (two) times daily with a meal. PATIENT NEEDS OFFICE VISIT FOR ADDITIONAL REFILLS 03/04/15  Yes Dolorez Jeffrey, PA-C  metoprolol succinate (TOPROL-XL) 50 MG 24 hr tablet Take 1 tablet (50 mg total) by mouth daily. Take with or immediately following a meal. 01/10/16  Yes Aleathea Pugmire, PA-C  naproxen sodium (ANAPROX) 220 MG tablet Take 440 mg by mouth 2 (two) times daily as needed (pain).   Yes Historical Provider, MD  ranitidine (ZANTAC) 150 MG tablet  Take 1 tablet (150 mg total) by mouth every morning. 03/04/15  Yes Marcelus Dubberly, PA-C  hydrochlorothiazide (HYDRODIURIL) 25 MG tablet Take 1 tablet (25 mg total) by mouth daily. 01/10/16   Harrison Mons, PA-C     Allergies  Allergen Reactions  . Losartan Potassium-Hctz Hives       Objective:  Physical Exam  Constitutional: He is oriented to person, place, and time. He appears well-developed and well-nourished. He is  active and cooperative. No distress.  BP (!) 168/92 (BP Location: Left Arm, Patient Position: Sitting, Cuff Size: Large)   Pulse 79   Temp 98.2 F (36.8 C)   Resp 16   Wt (!) 367 lb (166.5 kg)   SpO2 99%   BMI 49.77 kg/m   HENT:  Head: Normocephalic and atraumatic.  Right Ear: Hearing normal.  Left Ear: Hearing normal.  Eyes: Conjunctivae are normal. No scleral icterus.  Neck: Normal range of motion. Neck supple. No thyromegaly present.  Cardiovascular: Normal rate, regular rhythm and normal heart sounds.   Pulses:      Radial pulses are 2+ on the right side, and 2+ on the left side.  Pulmonary/Chest: Effort normal and breath sounds normal.  Lymphadenopathy:       Head (right side): No tonsillar, no preauricular, no posterior auricular and no occipital adenopathy present.       Head (left side): No tonsillar, no preauricular, no posterior auricular and no occipital adenopathy present.    He has no cervical adenopathy.       Right: No supraclavicular adenopathy present.       Left: No supraclavicular adenopathy present.  Neurological: He is alert and oriented to person, place, and time. No sensory deficit.  Skin: Skin is warm, dry and intact. No rash noted. No cyanosis or erythema. Nails show no clubbing.     Psychiatric: He has a normal mood and affect. His speech is normal and behavior is normal.           Assessment & Plan:   1. Diabetes mellitus type 2 in obese [facility]) Await labs. Adjust regimen as indicated by results. - Comprehensive metabolic panel - Hemoglobin A1c - Microalbumin, urine; Future  2. Essential hypertension Above goal, due to recent medication change and not picking up all he needed. He will pick up and start the HCTZ today. He will exhaust the supply he has of the atenolol and then start the metoprolol. Spoke with pharmacy to cancel remaining fills of the chlorthalidone, Tenoretic and atenolol. - Comprehensive metabolic panel - CBC with  Differential/Platelet  3. Hyperlipidemia, unspecified hyperlipidemia type Await labs. Adjust regimen as indicated by results. - Comprehensive metabolic panel - Lipid panel  4. BMI 45.0-49.9, adult [facility] Encouraged continued efforts for healthy eating and regular exercise.  5. Need for influenza vaccination - Flu Vaccine QUAD 36+ mos IM    Return in about 6 months (around 07/16/2016) for re-evaluation of diabetes, blood pressure and cholesterol.    Fara Chute, PA-C Physician Assistant-Certified [facility]

## 2016-01-17 LAB — LIPID PANEL
CHOLESTEROL: 130 mg/dL (ref 125–200)
HDL: 34 mg/dL — ABNORMAL LOW (ref >=40)
LDL Cholesterol: 73 mg/dL (ref <130)
Total CHOL/HDL Ratio: 3.8 Ratio (ref <=5.0)
Triglycerides: 115 mg/dL (ref <150)
VLDL: 23 mg/dL (ref <30)

## 2016-01-17 LAB — COMPREHENSIVE METABOLIC PANEL
ALK PHOS: 94 U/L (ref 40–115)
ALT: 19 U/L (ref 9–46)
AST: 18 U/L (ref 10–35)
Albumin: 3.9 g/dL (ref 3.6–5.1)
BILIRUBIN TOTAL: 1.2 mg/dL (ref 0.2–1.2)
BUN: 14 mg/dL (ref 7–25)
CHLORIDE: 105 mmol/L (ref 98–110)
CO2: 26 mmol/L (ref 20–31)
CREATININE: 0.95 mg/dL (ref 0.70–1.33)
Calcium: 9.1 mg/dL (ref 8.6–10.3)
Glucose, Bld: 159 mg/dL — ABNORMAL HIGH (ref 65–99)
Potassium: 4 mmol/L (ref 3.5–5.3)
SODIUM: 141 mmol/L (ref 135–146)
Total Protein: 6.8 g/dL (ref 6.1–8.1)

## 2016-01-17 LAB — HEMOGLOBIN A1C
HEMOGLOBIN A1C: 8.4 % — AB (ref <5.7)
Mean Plasma Glucose: 194 mg/dL

## 2016-03-22 ENCOUNTER — Other Ambulatory Visit: Payer: Self-pay | Admitting: Physician Assistant

## 2016-03-23 NOTE — Telephone Encounter (Signed)
01/16/16 last visit  07/16/16 next visit Ok to refill per protocol

## 2016-04-01 ENCOUNTER — Telehealth: Payer: Self-pay

## 2016-04-01 DIAGNOSIS — R220 Localized swelling, mass and lump, head: Secondary | ICD-10-CM

## 2016-04-01 DIAGNOSIS — E1169 Type 2 diabetes mellitus with other specified complication: Secondary | ICD-10-CM

## 2016-04-01 DIAGNOSIS — E669 Obesity, unspecified: Secondary | ICD-10-CM

## 2016-04-01 DIAGNOSIS — I10 Essential (primary) hypertension: Secondary | ICD-10-CM

## 2016-04-01 NOTE — Telephone Encounter (Signed)
Pt is requesting all of his meds including the insulin-tried to get specific names but this is all he would say   Also would like a referral to [place] [facility] for a spot on his scalp or side of head he would like to get removed   Best number [phone]

## 2016-04-03 ENCOUNTER — Other Ambulatory Visit: Payer: Self-pay | Admitting: Physician Assistant

## 2016-04-04 MED ORDER — GLIPIZIDE 10 MG PO TABS: ORAL_TABLET | ORAL | 3 refills | Status: DC

## 2016-04-04 MED ORDER — METFORMIN HCL 1000 MG PO TABS: 1000.0000 mg | ORAL_TABLET | Freq: Two times a day (BID) | ORAL | 3 refills | Status: DC

## 2016-04-04 MED ORDER — AMLODIPINE BESYLATE 10 MG PO TABS: 10.0000 mg | ORAL_TABLET | Freq: Every day | ORAL | 3 refills | Status: DC

## 2016-04-04 MED ORDER — RANITIDINE HCL 150 MG PO TABS: 150.0000 mg | ORAL_TABLET | Freq: Every morning | ORAL | 3 refills | Status: DC

## 2016-04-04 MED ORDER — INSULIN DETEMIR 100 UNIT/ML ~~LOC~~ SOLN: 92.0000 [IU] | Freq: Every day | SUBCUTANEOUS | 2 refills | Status: DC

## 2016-04-04 NOTE — Telephone Encounter (Signed)
01/16/16 last ov

## 2016-04-04 NOTE — Telephone Encounter (Signed)
Meds ordered this encounter  Medications  . glipiZIDE (GLUCOTROL) 10 MG tablet    Sig: TAKE 1 TABLET (10 MG TOTAL) BY MOUTH 2 (TWO) TIMES DAILY BEFORE A MEAL    Dispense:  180 tablet    Refill:  3  . insulin detemir (LEVEMIR) 100 UNIT/ML injection    Sig: Inject 0.92 mLs (92 Units total) into the skin daily.    Dispense:  30 mL    Refill:  2  . amLODipine (NORVASC) 10 MG tablet    Sig: Take 1 tablet (10 mg total) by mouth daily.    Dispense:  90 tablet    Refill:  3    Order Specific Question:   Supervising Provider    Answer:   Brigitte Pulse, EVA N [[id number]]  . metFORMIN (GLUCOPHAGE) 1000 MG tablet    Sig: Take 1 tablet (1,000 mg total) by mouth 2 (two) times daily with a meal.    Dispense:  180 tablet    Refill:  3    Order Specific Question:   Supervising Provider    Answer:   SHAW, EVA N [[id number]]  . ranitidine (ZANTAC) 150 MG tablet    Sig: Take 1 tablet (150 mg total) by mouth every morning.    Dispense:  90 tablet    Refill:  3    Order Specific Question:   Supervising Provider    Answer:   Brigitte Pulse, EVA N [[id number]]

## 2016-04-05 NOTE — Telephone Encounter (Signed)
02/2015 referral in, still good?

## 2016-04-05 NOTE — Telephone Encounter (Signed)
Looks like the patient had an appointment with Tsuei on 03-27-15 @ 10 am at [facility]. I will resend this today and see if they will reschedule with him.

## 2016-04-22 ENCOUNTER — Other Ambulatory Visit: Payer: Self-pay | Admitting: Physician Assistant

## 2016-04-22 DIAGNOSIS — E1169 Type 2 diabetes mellitus with other specified complication: Secondary | ICD-10-CM

## 2016-04-22 DIAGNOSIS — E669 Obesity, unspecified: Principal | ICD-10-CM

## 2016-05-22 ENCOUNTER — Telehealth: Payer: Self-pay

## 2016-05-22 NOTE — Telephone Encounter (Signed)
Pt calling to return Sydney's call regarding a referral to [facility], asks for a call back.

## 2016-05-23 NOTE — Telephone Encounter (Signed)
Spoke with pt about referral advised him that we have sent referral and he could call to set up appt

## 2016-06-25 ENCOUNTER — Other Ambulatory Visit: Payer: Self-pay | Admitting: Physician Assistant

## 2016-07-15 ENCOUNTER — Other Ambulatory Visit: Payer: Self-pay | Admitting: Physician Assistant

## 2016-07-16 ENCOUNTER — Ambulatory Visit: Payer: BC Managed Care – PPO | Admitting: Physician Assistant

## 2016-07-30 ENCOUNTER — Ambulatory Visit: Payer: BC Managed Care – PPO | Admitting: Physician Assistant

## 2016-08-16 ENCOUNTER — Ambulatory Visit (INDEPENDENT_AMBULATORY_CARE_PROVIDER_SITE_OTHER): Payer: BC Managed Care – PPO | Admitting: Physician Assistant

## 2016-08-16 ENCOUNTER — Encounter: Payer: Self-pay | Admitting: Physician Assistant

## 2016-08-16 VITALS — BP 160/88 | HR 65 | Temp 97.7°F | Resp 18 | Ht 72.0 in | Wt 359.0 lb

## 2016-08-16 DIAGNOSIS — E113399 Type 2 diabetes mellitus with moderate nonproliferative diabetic retinopathy without macular edema, unspecified eye: Secondary | ICD-10-CM | POA: Diagnosis not present

## 2016-08-16 DIAGNOSIS — E785 Hyperlipidemia, unspecified: Secondary | ICD-10-CM

## 2016-08-16 DIAGNOSIS — I10 Essential (primary) hypertension: Secondary | ICD-10-CM

## 2016-08-16 DIAGNOSIS — Z6841 Body Mass Index (BMI) 40.0 and over, adult: Secondary | ICD-10-CM | POA: Diagnosis not present

## 2016-08-16 DIAGNOSIS — E1169 Type 2 diabetes mellitus with other specified complication: Secondary | ICD-10-CM | POA: Diagnosis not present

## 2016-08-16 DIAGNOSIS — K219 Gastro-esophageal reflux disease without esophagitis: Secondary | ICD-10-CM | POA: Diagnosis not present

## 2016-08-16 DIAGNOSIS — E669 Obesity, unspecified: Secondary | ICD-10-CM

## 2016-08-16 DIAGNOSIS — E119 Type 2 diabetes mellitus without complications: Secondary | ICD-10-CM

## 2016-08-16 MED ORDER — AMLODIPINE BESYLATE 10 MG PO TABS: 10.0000 mg | ORAL_TABLET | Freq: Every day | ORAL | 3 refills | Status: DC

## 2016-08-16 MED ORDER — METFORMIN HCL 1000 MG PO TABS: 1000.0000 mg | ORAL_TABLET | Freq: Two times a day (BID) | ORAL | 3 refills | Status: DC

## 2016-08-16 MED ORDER — HYDRALAZINE HCL 25 MG PO TABS: 25.0000 mg | ORAL_TABLET | Freq: Three times a day (TID) | ORAL | 1 refills | Status: DC

## 2016-08-16 MED ORDER — CHLORTHALIDONE 25 MG PO TABS: 25.0000 mg | ORAL_TABLET | Freq: Every day | ORAL | 3 refills | Status: DC

## 2016-08-16 MED ORDER — ATORVASTATIN CALCIUM 40 MG PO TABS: 40.0000 mg | ORAL_TABLET | Freq: Every day | ORAL | 3 refills | Status: DC

## 2016-08-16 MED ORDER — GLIPIZIDE 10 MG PO TABS: ORAL_TABLET | ORAL | 3 refills | Status: DC

## 2016-08-16 MED ORDER — ATENOLOL 50 MG PO TABS: 50.0000 mg | ORAL_TABLET | Freq: Every day | ORAL | 3 refills | Status: DC

## 2016-08-16 MED ORDER — HYDROCHLOROTHIAZIDE 25 MG PO TABS: 25.0000 mg | ORAL_TABLET | Freq: Every day | ORAL | 3 refills | Status: DC

## 2016-08-16 MED ORDER — RANITIDINE HCL 150 MG PO TABS: 150.0000 mg | ORAL_TABLET | Freq: Every morning | ORAL | 3 refills | Status: DC

## 2016-08-16 MED ORDER — INSULIN DETEMIR 100 UNIT/ML ~~LOC~~ SOLN: 92.0000 [IU] | Freq: Every day | SUBCUTANEOUS | 2 refills | Status: DC

## 2016-08-16 MED ORDER — DAPAGLIFLOZIN PROPANEDIOL 5 MG PO TABS: 5.0000 mg | ORAL_TABLET | Freq: Every day | ORAL | 3 refills | Status: DC

## 2016-08-16 NOTE — Progress Notes (Signed)
Patient ID: Justin Houston, male    DOB: 1965/04/19, 51 y.o.   MRN: [id number]  PCP: Harrison Mons, PA-C  Chief Complaint  Patient presents with  . Follow-up    6 month/ DM  check  . Medication Refill    all    Subjective:   Presents for evaluation of chronic problems: diabetes, HTN, lipids and obesity. I last saw him in October.  He needs refills of all his medications, which he tolerates well. Next eye exam is this month with Dr. Idolina Primer.  He denies CP, SOB, HA, dizziness, nausea, polydipsia and polyuria.  He has 2 beta blockers and 2 diuretics on his medication list, but is faitrly certain he is only taking one of each.  Review of Systems  Constitutional: Negative for activity change, appetite change, fatigue and unexpected weight change.  HENT: Negative for congestion, dental problem, ear pain, hearing loss, mouth sores, postnasal drip, rhinorrhea, sneezing, sore throat, tinnitus and trouble swallowing.   Eyes: Negative for photophobia, pain, redness and visual disturbance.  Respiratory: Negative for cough, chest tightness and shortness of breath.   Cardiovascular: Negative for chest pain, palpitations and leg swelling.  Gastrointestinal: Negative for abdominal pain, blood in stool, constipation, diarrhea, nausea and vomiting.  Endocrine: Negative for cold intolerance, heat intolerance, polydipsia, polyphagia and polyuria.  Genitourinary: Negative for dysuria, frequency, hematuria and urgency.  Musculoskeletal: Negative for arthralgias, gait problem, myalgias and neck stiffness.  Skin: Negative for rash.  Neurological: Negative for dizziness, speech difficulty, weakness, light-headedness, numbness and headaches.  Hematological: Negative for adenopathy.  Psychiatric/Behavioral: Negative for confusion and sleep disturbance. The patient is not nervous/anxious.        Patient Active Problem List   Diagnosis Date Noted  . Diabetic retinopathy ([place]) 12/08/2014  .  Colon cancer screening 11/10/2014  . Morbid obesity ([place]) 11/10/2014  . Umbilical hernia, incarcerated 09/29/2013  . Mass of head, right postauricular SQ 7x4cm 09/29/2013  . Glaucoma 05/26/2013  . Gout 08/01/2012  . BMI 45.0-49.9, adult ([place]) 08/01/2012  . Hypertension 07/20/2011  . Diabetes mellitus type 2 in obese ([place]) 07/20/2011  . Hyperlipidemia 07/20/2011     Prior to Admission medications   Medication Sig Start Date End Date Taking? Authorizing Provider  amLODipine (NORVASC) 10 MG tablet Take 1 tablet (10 mg total) by mouth daily. 04/04/16  Yes Elzina Devera, PA-C  atenolol (TENORMIN) 50 MG tablet Take 1 tablet (50 mg total) by mouth daily. 12/29/15  Yes Lakota Markgraf, PA-C  atorvastatin (LIPITOR) 40 MG tablet TAKE ONE TABLET BY MOUTH DAILY 06/25/16  Yes Karla Vines, PA-C  B-D INS SYR ULTRAFINE 1CC/31G 31G X 5/16" 1 ML MISC USE AS DIRECTED 04/23/16  Yes Kandee Escalante, PA-C  chlorthalidone (HYGROTON) 25 MG tablet Take 1 tablet (25 mg total) by mouth daily. 01/16/16  Yes Katerina Zurn, PA-C  clobetasol ointment (TEMOVATE) 7.82 % Apply 1 application topically 3 (three) times daily as needed (rash.). 03/04/15  Yes Shakeerah Gradel, PA-C  FARXIGA 5 MG TABS tablet TAKE 1 TABLET BY MOUTH DAILY 07/15/16  Yes Keighley Deckman, PA-C  glipiZIDE (GLUCOTROL) 10 MG tablet TAKE 1 TABLET (10 MG TOTAL) BY MOUTH 2 (TWO) TIMES DAILY BEFORE A MEAL 04/04/16  Yes Micca Matura, PA-C  hydrochlorothiazide (HYDRODIURIL) 25 MG tablet Take 1 tablet (25 mg total) by mouth daily. 01/10/16  Yes Nakyla Bracco, PA-C  insulin detemir (LEVEMIR) 100 UNIT/ML injection Inject 0.92 mLs (92 Units total) into the skin daily. 04/04/16  Yes Harrison Mons,  PA-C  metFORMIN (GLUCOPHAGE) 1000 MG tablet Take 1 tablet (1,000 mg total) by mouth 2 (two) times daily with a meal. 04/04/16  Yes Adryel Wortmann, PA-C  metoprolol succinate (TOPROL-XL) 50 MG 24 hr tablet Take 1 tablet (50 mg total) by mouth daily. Take with or immediately  following a meal. 01/10/16  Yes Goldie Tregoning, PA-C  naproxen sodium (ANAPROX) 220 MG tablet Take 440 mg by mouth 2 (two) times daily as needed (pain).   Yes Historical Provider, MD  ranitidine (ZANTAC) 150 MG tablet Take 1 tablet (150 mg total) by mouth every morning. 04/04/16  Yes Harrison Mons, PA-C     Allergies  Allergen Reactions  . Losartan Potassium-Hctz Hives       Objective:  Physical Exam  Constitutional: He is oriented to person, place, and time. He appears well-developed and well-nourished. He is active and cooperative. No distress.  BP (!) 160/88   Pulse 65   Temp 97.7 F (36.5 C) (Oral)   Resp 18   Ht 6' (1.829 m)   Wt (!) 359 lb (162.8 kg)   BMI 48.69 kg/m   HENT:  Head: Normocephalic and atraumatic.  Right Ear: Hearing normal.  Left Ear: Hearing normal.  Eyes: Conjunctivae are normal. No scleral icterus.  Neck: Normal range of motion. Neck supple. No thyromegaly present.  Cardiovascular: Normal rate, regular rhythm and normal heart sounds.   Pulses:      Radial pulses are 2+ on the right side, and 2+ on the left side.  Pulmonary/Chest: Effort normal and breath sounds normal.  Lymphadenopathy:       Head (right side): No tonsillar, no preauricular, no posterior auricular and no occipital adenopathy present.       Head (left side): No tonsillar, no preauricular, no posterior auricular and no occipital adenopathy present.    He has no cervical adenopathy.       Right: No supraclavicular adenopathy present.       Left: No supraclavicular adenopathy present.  Neurological: He is alert and oriented to person, place, and time. No sensory deficit.  Skin: Skin is warm, dry and intact. No rash noted. No cyanosis or erythema. Nails show no clubbing.     Psychiatric: He has a normal mood and affect. His speech is normal and behavior is normal.       Assessment & Plan:   Problem List Items Addressed This Visit    Hypertension    Uncontrolled. Add apresoline 25  mg TID. He will check him meds at home and make sure that he is taking EITHER atenolol OR metoprolol and EITHER hydrochlorothiazine OR chlorthalidone. Continue amlodipine 10 mg. Weight loss will help.      Relevant Medications   hydrochlorothiazide (HYDRODIURIL) 25 MG tablet   chlorthalidone (HYGROTON) 25 MG tablet   atorvastatin (LIPITOR) 40 MG tablet   atenolol (TENORMIN) 50 MG tablet   amLODipine (NORVASC) 10 MG tablet   hydrALAZINE (APRESOLINE) 25 MG tablet   Other Relevant Orders   CBC with Differential/Platelet   Comprehensive metabolic panel   TSH   T4, free   Diabetes mellitus type 2 in obese [facility]) - Primary    Await labs. Adjust regimen as indicated by results.       Relevant Medications   metFORMIN (GLUCOPHAGE) 1000 MG tablet   insulin detemir (LEVEMIR) 100 UNIT/ML injection   glipiZIDE (GLUCOTROL) 10 MG tablet   dapagliflozin propanediol (FARXIGA) 5 MG TABS tablet   atorvastatin (LIPITOR) 40 MG tablet   Other  Relevant Orders   Comprehensive metabolic panel   Hemoglobin A1c   Microalbumin, urine   HM DIABETES EYE EXAM (Completed)   HM DIABETES FOOT EXAM (Completed)   Hyperlipidemia    Await labs. Adjust regimen as indicated by results.       Relevant Medications   hydrochlorothiazide (HYDRODIURIL) 25 MG tablet   chlorthalidone (HYGROTON) 25 MG tablet   atorvastatin (LIPITOR) 40 MG tablet   atenolol (TENORMIN) 50 MG tablet   amLODipine (NORVASC) 10 MG tablet   hydrALAZINE (APRESOLINE) 25 MG tablet   Other Relevant Orders   Comprehensive metabolic panel   Lipid panel   BMI 45.0-49.9, adult ([place])    Refer to Healthy Weight and [place].      Relevant Medications   metFORMIN (GLUCOPHAGE) 1000 MG tablet   insulin detemir (LEVEMIR) 100 UNIT/ML injection   glipiZIDE (GLUCOTROL) 10 MG tablet   dapagliflozin propanediol (FARXIGA) 5 MG TABS tablet   Other Relevant Orders   Amb Ref to [facility]   Diabetic retinopathy ([place])    Follow-up  with eye specialist as planned.      Relevant Medications   metFORMIN (GLUCOPHAGE) 1000 MG tablet   insulin detemir (LEVEMIR) 100 UNIT/ML injection   glipiZIDE (GLUCOTROL) 10 MG tablet   dapagliflozin propanediol (FARXIGA) 5 MG TABS tablet   atorvastatin (LIPITOR) 40 MG tablet   GERD (gastroesophageal reflux disease)    Controlled on ranitidine. Continue treatment.      Relevant Medications   ranitidine (ZANTAC) 150 MG tablet       Return in about 4 weeks (around 09/13/2016) for re-evaluation of blood pressure.   Fara Chute, PA-C [facility]

## 2016-08-16 NOTE — Assessment & Plan Note (Addendum)
Uncontrolled. Add apresoline 25 mg TID. He will check him meds at home and make sure that he is taking EITHER atenolol OR metoprolol and EITHER hydrochlorothiazine OR chlorthalidone. Continue amlodipine 10 mg. Weight loss will help.

## 2016-08-16 NOTE — Progress Notes (Deleted)
     Patient ID: Justin Houston, male    DOB: Aug 02, 1965, 51 y.o.   MRN: [id number]  PCP: Harrison Mons, PA-C  Chief Complaint  Patient presents with  . Follow-up    6 month/ DM  check  . Medication Refill    all    Subjective:   Presents for evaluation of ***.  ***.    Review of Systems     Patient Active Problem List   Diagnosis Date Noted  . Diabetic retinopathy ([place]) 12/08/2014  . Colon cancer screening 11/10/2014  . Morbid obesity ([place]) 11/10/2014  . Umbilical hernia, incarcerated 09/29/2013  . Mass of head, right postauricular SQ 7x4cm 09/29/2013  . Glaucoma 05/26/2013  . Gout 08/01/2012  . BMI 45.0-49.9, adult ([place]) 08/01/2012  . Hypertension 07/20/2011  . Diabetes mellitus type 2 in obese ([place]) 07/20/2011  . Hyperlipidemia 07/20/2011     Prior to Admission medications   Medication Sig Start Date End Date Taking? Authorizing Provider  amLODipine (NORVASC) 10 MG tablet Take 1 tablet (10 mg total) by mouth daily. 04/04/16  Yes Harini Dearmond, PA-C  atenolol (TENORMIN) 50 MG tablet Take 1 tablet (50 mg total) by mouth daily. 12/29/15  Yes Brysen Shankman, PA-C  atorvastatin (LIPITOR) 40 MG tablet TAKE ONE TABLET BY MOUTH DAILY 06/25/16  Yes Sharonica Kraszewski, PA-C  B-D INS SYR ULTRAFINE 1CC/31G 31G X 5/16" 1 ML MISC USE AS DIRECTED 04/23/16  Yes Richel Millspaugh, PA-C  chlorthalidone (HYGROTON) 25 MG tablet Take 1 tablet (25 mg total) by mouth daily. 01/16/16  Yes Jossilyn Benda, PA-C  clobetasol ointment (TEMOVATE) 5.05 % Apply 1 application topically 3 (three) times daily as needed (rash.). 03/04/15  Yes Kaitelyn Jamison, PA-C  FARXIGA 5 MG TABS tablet TAKE 1 TABLET BY MOUTH DAILY 07/15/16  Yes Lindsay Straka, PA-C  glipiZIDE (GLUCOTROL) 10 MG tablet TAKE 1 TABLET (10 MG TOTAL) BY MOUTH 2 (TWO) TIMES DAILY BEFORE A MEAL 04/04/16  Yes Estanislado Surgeon, PA-C  hydrochlorothiazide (HYDRODIURIL) 25 MG tablet Take 1 tablet (25 mg total) by mouth daily. 01/10/16  Yes Phillippa Straub, PA-C  insulin detemir (LEVEMIR) 100 UNIT/ML injection Inject 0.92 mLs (92 Units total) into the skin daily. 04/04/16  Yes Naji Mehringer, PA-C  metFORMIN (GLUCOPHAGE) 1000 MG tablet Take 1 tablet (1,000 mg total) by mouth 2 (two) times daily with a meal. 04/04/16  Yes Axl Rodino, PA-C  metoprolol succinate (TOPROL-XL) 50 MG 24 hr tablet Take 1 tablet (50 mg total) by mouth daily. Take with or immediately following a meal. 01/10/16  Yes Delaynee Alred, PA-C  naproxen sodium (ANAPROX) 220 MG tablet Take 440 mg by mouth 2 (two) times daily as needed (pain).   Yes Historical Provider, MD  ranitidine (ZANTAC) 150 MG tablet Take 1 tablet (150 mg total) by mouth every morning. 04/04/16  Yes Harrison Mons, PA-C     Allergies  Allergen Reactions  . Losartan Potassium-Hctz Hives       Objective:  Physical Exam         Assessment & Plan:   ***

## 2016-08-16 NOTE — Assessment & Plan Note (Signed)
Await labs. Adjust regimen as indicated by results.  

## 2016-08-16 NOTE — Assessment & Plan Note (Signed)
Controlled on ranitidine. Continue treatment.

## 2016-08-16 NOTE — Assessment & Plan Note (Signed)
Follow-up with eye specialist as planned.

## 2016-08-16 NOTE — Patient Instructions (Signed)
     IF you received an x-ray today, you will receive an invoice from [facility]. Please contact [facility] at [phone] with questions or concerns regarding your invoice.   IF you received labwork today, you will receive an invoice from [facility]. Please contact [facility] at [phone] with questions or concerns regarding your invoice.   Our billing staff will not be able to assist you with questions regarding bills from these companies.  You will be contacted with the lab results as soon as they are available. The fastest way to get your results is to activate your My Chart account. Instructions are located on the last page of this paperwork. If you have not heard from us regarding the results in 2 weeks, please contact this office.     

## 2016-08-16 NOTE — Assessment & Plan Note (Signed)
Refer to Healthy Weight and [place].

## 2016-08-17 LAB — CBC WITH DIFFERENTIAL/PLATELET
Basophils Absolute: 0 10*3/uL (ref 0.0–0.2)
Basos: 1 %
EOS (ABSOLUTE): 0.1 10*3/uL (ref 0.0–0.4)
Eos: 1 %
HEMATOCRIT: 45.4 % (ref 37.5–51.0)
Hemoglobin: 15.2 g/dL (ref 13.0–17.7)
Immature Grans (Abs): 0 10*3/uL (ref 0.0–0.1)
Immature Granulocytes: 0 %
LYMPHS ABS: 2.1 10*3/uL (ref 0.7–3.1)
Lymphs: 37 %
MCH: 30 pg (ref 26.6–33.0)
MCHC: 33.5 g/dL (ref 31.5–35.7)
MCV: 90 fL (ref 79–97)
MONOCYTES: 9 %
MONOS ABS: 0.5 10*3/uL (ref 0.1–0.9)
NEUTROS ABS: 3 10*3/uL (ref 1.4–7.0)
Neutrophils: 52 %
Platelets: 184 10*3/uL (ref 150–379)
RBC: 5.07 x10E6/uL (ref 4.14–5.80)
RDW: 13.8 % (ref 12.3–15.4)
WBC: 5.7 10*3/uL (ref 3.4–10.8)

## 2016-08-17 LAB — COMPREHENSIVE METABOLIC PANEL
A/G RATIO: 1.4 (ref 1.2–2.2)
ALBUMIN: 4.2 g/dL (ref 3.5–5.5)
ALK PHOS: 139 IU/L — AB (ref 39–117)
ALT: 17 IU/L (ref 0–44)
AST: 16 IU/L (ref 0–40)
BUN / CREAT RATIO: 18 (ref 9–20)
BUN: 15 mg/dL (ref 6–24)
Bilirubin Total: 1.1 mg/dL (ref 0.0–1.2)
CHLORIDE: 97 mmol/L (ref 96–106)
CO2: 27 mmol/L (ref 18–29)
Calcium: 9.5 mg/dL (ref 8.7–10.2)
Creatinine, Ser: 0.85 mg/dL (ref 0.76–1.27)
GFR calc Af Amer: 116 mL/min/{1.73_m2} (ref >59)
GFR calc non Af Amer: 100 mL/min/{1.73_m2} (ref >59)
GLOBULIN, TOTAL: 3.1 g/dL (ref 1.5–4.5)
Glucose: 235 mg/dL — ABNORMAL HIGH (ref 65–99)
POTASSIUM: 4 mmol/L (ref 3.5–5.2)
SODIUM: 141 mmol/L (ref 134–144)
Total Protein: 7.3 g/dL (ref 6.0–8.5)

## 2016-08-17 LAB — HEMOGLOBIN A1C
Est. average glucose Bld gHb Est-mCnc: 203 mg/dL
HEMOGLOBIN A1C: 8.7 % — AB (ref 4.8–5.6)

## 2016-08-17 LAB — MICROALBUMIN, URINE: Microalbumin, Urine: 308.7 ug/mL

## 2016-08-17 LAB — LIPID PANEL
CHOL/HDL RATIO: 3.4 ratio (ref 0.0–5.0)
Cholesterol, Total: 127 mg/dL (ref 100–199)
HDL: 37 mg/dL — AB (ref >39)
LDL Calculated: 74 mg/dL (ref 0–99)
Triglycerides: 80 mg/dL (ref 0–149)
VLDL Cholesterol Cal: 16 mg/dL (ref 5–40)

## 2016-08-17 LAB — TSH: TSH: 2.67 u[IU]/mL (ref 0.450–4.500)

## 2016-08-17 LAB — T4, FREE: Free T4: 1.35 ng/dL (ref 0.82–1.77)

## 2016-08-27 NOTE — Addendum Note (Signed)
Addended by: Fara Chute on: 08/27/2016 06:07 PM   Modules accepted: Orders

## 2016-09-17 ENCOUNTER — Ambulatory Visit: Payer: BC Managed Care – PPO | Admitting: Physician Assistant

## 2016-10-01 ENCOUNTER — Ambulatory Visit (INDEPENDENT_AMBULATORY_CARE_PROVIDER_SITE_OTHER): Payer: BC Managed Care – PPO | Admitting: Physician Assistant

## 2016-10-01 VITALS — BP 147/84 | HR 68 | Temp 98.2°F | Resp 18 | Ht 72.0 in | Wt 357.6 lb

## 2016-10-01 DIAGNOSIS — K420 Umbilical hernia with obstruction, without gangrene: Secondary | ICD-10-CM

## 2016-10-01 DIAGNOSIS — I10 Essential (primary) hypertension: Secondary | ICD-10-CM | POA: Diagnosis not present

## 2016-10-01 DIAGNOSIS — R220 Localized swelling, mass and lump, head: Secondary | ICD-10-CM | POA: Diagnosis not present

## 2016-10-01 NOTE — Patient Instructions (Addendum)
Go ahead and start the apresoline. When you finish your supply of hydrochlorothiazide (HCTZ), start the chlorthalidone (Hygroton). Continue the amlodipine (Norvasc) and atenolol (Tenormin).    IF you received an x-ray today, you will receive an invoice from [facility]. Please contact [facility] at [phone] with questions or concerns regarding your invoice.   IF you received labwork today, you will receive an invoice from [facility]. Please contact [facility] at [phone] with questions or concerns regarding your invoice.   Our billing staff will not be able to assist you with questions regarding bills from these companies.  You will be contacted with the lab results as soon as they are available. The fastest way to get your results is to activate your My Chart account. Instructions are located on the last page of this paperwork. If you have not heard from [place] regarding the results in 2 weeks, please contact this office.

## 2016-10-01 NOTE — Progress Notes (Signed)
Patient ID: Justin Houston, male    DOB: [birth year], 51 y.o.   MRN: [id number]  PCP: Harrison Mons, PA-C  Chief Complaint  Patient presents with  . Hypertension    last visit 08/16/2016 BP was 160/88, Today BP 152/87  . Follow-up    Subjective:   Presents for evaluation of HTN.  Last month at his routine diabetes follow-up visit, his BP remained above goal. Apresoline was added to his regimen of HCTZ, amlodipine and atenolol. Has not started apresoline yet. Is still taking the HCTZ, and when he exhausts his supply, will switch to chlorthalidone.  Ready to proceed with abdominal hernia repair and lipoma excision. He saw Dr. Georgette Dover 03/2015, but wasn't ready yet.   Review of Systems Denies chest pain, shortness of breath, HA, dizziness, vision change, nausea, vomiting, diarrhea, constipation, melena, hematochezia, dysuria, increased urinary urgency or frequency, increased hunger or thirst, unintentional weight change, unexplained myalgias or arthralgias, rash.    Patient Active Problem List   Diagnosis Date Noted  . GERD (gastroesophageal reflux disease) 08/16/2016  . Diabetic retinopathy ([place]) 12/08/2014  . Colon cancer screening 11/10/2014  . Morbid obesity ([place]) 11/10/2014  . Umbilical hernia, incarcerated 09/29/2013  . Mass of head, right postauricular SQ 7x4cm 09/29/2013  . Glaucoma 05/26/2013  . Gout 08/01/2012  . BMI 45.0-49.9, adult ([place]) 08/01/2012  . Hypertension 07/20/2011  . Diabetes mellitus type 2 in obese ([place]) 07/20/2011  . Hyperlipidemia 07/20/2011     Prior to Admission medications   Medication Sig Start Date End Date Taking? Authorizing Provider  amLODipine (NORVASC) 10 MG tablet Take 1 tablet (10 mg total) by mouth daily. 08/16/16  Yes Mackynzie Woolford, PA-C  atenolol (TENORMIN) 50 MG tablet Take 1 tablet (50 mg total) by mouth daily. 08/16/16  Yes Joanell Cressler, PA-C  atorvastatin (LIPITOR) 40 MG tablet Take 1 tablet (40 mg total) by mouth  daily. 08/16/16  Yes Zamire Whitehurst, PA-C  dapagliflozin propanediol (FARXIGA) 5 MG TABS tablet Take 5 mg by mouth daily. 08/16/16  Yes Cally Nygard, PA-C  glipiZIDE (GLUCOTROL) 10 MG tablet TAKE 1 TABLET (10 MG TOTAL) BY MOUTH 2 (TWO) TIMES DAILY BEFORE A MEAL 08/16/16  Yes Eliyah Bazzi, PA-C  hydrochlorothiazide (HYDRODIURIL) 25 MG tablet Take 1 tablet (25 mg total) by mouth daily. 08/16/16  Yes Jaisen Wiltrout, PA-C  insulin detemir (LEVEMIR) 100 UNIT/ML injection Inject 0.92 mLs (92 Units total) into the skin daily. 08/16/16  Yes Jakie Debow, PA-C  metFORMIN (GLUCOPHAGE) 1000 MG tablet Take 1 tablet (1,000 mg total) by mouth 2 (two) times daily with a meal. 08/16/16  Yes Carmon Sahli, PA-C  ranitidine (ZANTAC) 150 MG tablet Take 1 tablet (150 mg total) by mouth every morning. 08/16/16  Yes Reianna Batdorf, PA-C  B-D INS SYR ULTRAFINE 1CC/31G 31G X 5/16" 1 ML MISC USE AS DIRECTED Patient not taking: Reported on 10/01/2016 04/23/16   Harrison Mons, PA-C  chlorthalidone (HYGROTON) 25 MG tablet Take 1 tablet (25 mg total) by mouth daily. Patient not taking: Reported on 10/01/2016 08/16/16   Harrison Mons, PA-C  clobetasol ointment (TEMOVATE) 3.55 % Apply 1 application topically 3 (three) times daily as needed (rash.). 03/04/15   Harrison Mons, PA-C  hydrALAZINE (APRESOLINE) 25 MG tablet Take 1 tablet (25 mg total) by mouth 3 (three) times daily. 08/16/16   Harrison Mons, PA-C  naproxen sodium (ANAPROX) 220 MG tablet Take 440 mg by mouth 2 (two) times daily as needed (pain).    [provider]  Allergies  Allergen Reactions  . Losartan Potassium-Hctz Hives       Objective:  Physical Exam  Constitutional: He is oriented to person, place, and time. He appears well-developed and well-nourished. He is active and cooperative. No distress.  BP (!) 147/84 (BP Location: Left Arm, Patient Position: Sitting, Cuff Size: Large)   Pulse 68   Temp 98.2 F (36.8 C) (Oral)   Resp 18   Ht 6'  (1.829 m)   Wt (!) 357 lb 9.6 oz (162.2 kg)   SpO2 98%   BMI 48.50 kg/m   HENT:  Head: Normocephalic and atraumatic.  Right Ear: Hearing normal.  Left Ear: Hearing normal.  Eyes: Conjunctivae are normal. No scleral icterus.  Neck: Normal range of motion. Neck supple. No thyromegaly present.  Cardiovascular: Normal rate, regular rhythm and normal heart sounds.   Pulses:      Radial pulses are 2+ on the right side, and 2+ on the left side.  Pulmonary/Chest: Effort normal and breath sounds normal.  Lymphadenopathy:       Head (right side): No tonsillar, no preauricular, no posterior auricular and no occipital adenopathy present.       Head (left side): No tonsillar, no preauricular, no posterior auricular and no occipital adenopathy present.    He has no cervical adenopathy.       Right: No supraclavicular adenopathy present.       Left: No supraclavicular adenopathy present.  Neurological: He is alert and oriented to person, place, and time. No sensory deficit.  Skin: Skin is warm, dry and intact. No rash noted. No cyanosis or erythema. Nails show no clubbing.  Psychiatric: He has a normal mood and affect. His speech is normal and behavior is normal.           Assessment & Plan:   Problem List Items Addressed This Visit    Hypertension - Primary    Not controlled. Hasn't started apresoline yet. Encouraged him to start it.      Umbilical hernia, incarcerated   Relevant Orders   Ambulatory referral to General Surgery   Mass of head, right postauricular SQ 7x4cm   Relevant Orders   Ambulatory referral to General Surgery       Return in about 6 weeks (around 11/12/2016) for re-evaluation of blood pressure, diabetes, cholesterol.   Fara Chute, PA-C [facility]

## 2016-10-04 ENCOUNTER — Telehealth: Payer: Self-pay | Admitting: Physician Assistant

## 2016-10-04 NOTE — Telephone Encounter (Signed)
See message.

## 2016-10-04 NOTE — Telephone Encounter (Signed)
Pt called checking in on general surgery referral. Will send referral once notes are completed. Thanks! Pt number is [phone].

## 2016-10-05 NOTE — Assessment & Plan Note (Addendum)
Not controlled. Hasn't started apresoline yet. Encouraged him to start it.

## 2016-10-06 NOTE — Telephone Encounter (Signed)
Note is complete. Please proceed with referral.

## 2016-10-07 NOTE — Telephone Encounter (Signed)
The endocrinologist is a diabetes specialist. He can expect that it will be a visit like he has with me, but more in depth about the diabetes. He may not need to have blood drawn, or he may need to provide another sample. I expect that the specialist will make medication changes to help get better control.

## 2016-10-07 NOTE — Telephone Encounter (Signed)
Referral sent to [facility] on 6/25. Pt has appt for Endocrinology tomorrow on 6/26 and wanted to be advised on what exactly the appt was for. He said he knows its for diabetes but wanted to know more detail about what he should expect and what he was going for. He said he was concerned about what he was going in to.

## 2016-10-08 ENCOUNTER — Encounter: Payer: Self-pay | Admitting: Endocrinology

## 2016-10-08 ENCOUNTER — Ambulatory Visit (INDEPENDENT_AMBULATORY_CARE_PROVIDER_SITE_OTHER): Payer: BC Managed Care – PPO | Admitting: Endocrinology

## 2016-10-08 VITALS — BP 142/92 | HR 72 | Ht 72.0 in | Wt 363.0 lb

## 2016-10-08 DIAGNOSIS — E669 Obesity, unspecified: Secondary | ICD-10-CM

## 2016-10-08 DIAGNOSIS — E1169 Type 2 diabetes mellitus with other specified complication: Secondary | ICD-10-CM | POA: Diagnosis not present

## 2016-10-08 MED ORDER — INSULIN ASPART 100 UNIT/ML FLEXPEN: 15.0000 [IU] | PEN_INJECTOR | Freq: Three times a day (TID) | SUBCUTANEOUS | 11 refills | Status: DC

## 2016-10-08 MED ORDER — INSULIN ASPART 100 UNIT/ML ~~LOC~~ SOLN: 15.0000 [IU] | Freq: Three times a day (TID) | SUBCUTANEOUS | 11 refills | Status: DC

## 2016-10-08 MED ORDER — INSULIN DETEMIR 100 UNIT/ML ~~LOC~~ SOLN: 50.0000 [IU] | Freq: Every day | SUBCUTANEOUS | 2 refills | Status: DC

## 2016-10-08 MED ORDER — GLUCOSE BLOOD VI STRP: 1.0000 | ORAL_STRIP | Freq: Two times a day (BID) | 12 refills | Status: DC

## 2016-10-08 NOTE — Progress Notes (Signed)
Subjective:    Patient ID: Justin Houston, male    DOB: September 15, 1965, 51 y.o.   MRN: [id number]  HPI pt is referred Daphane Shepherd, PA, for diabetes.  Pt states DM was dx'ed in 1997; he has mild if any neuropathy of the lower extremities, but he has associated NPDR; he has been on insulin since soon after dx; pt says his diet and exercise are good; he has never had pancreatitis, pancreatic surgery, severe hypoglycemia or DKA.  He takes levemir 92 units qam, glipizide, and metformin.  He says cbg's are persistently over 200.   Past Medical History:  Diagnosis Date  . Dermatitis   . Diabetes mellitus   . GERD (gastroesophageal reflux disease)   . Hyperlipidemia   . Hypertension   . Lipoma of scalp   . Obesity     Past Surgical History:  Procedure Laterality Date  . COLONOSCOPY WITH PROPOFOL N/A 12/01/2014   Procedure: COLONOSCOPY WITH PROPOFOL;  Surgeon: [name], MD;  Location: [facility];  Service: Endoscopy;  Laterality: N/A;  . right arm ORIF      Social History   Social History  . Marital status: Single    Spouse name: n/a  . Number of children: 0  . Years of education: 29   Occupational History  . [place] History Main Topics  . Smoking status: Former Smoker    Quit date: 09/05/1994  . Smokeless tobacco: Never Used  . Alcohol use No  . Drug use: No  . Sexual activity: No   Other Topics Concern  . Not on file   Social History Narrative   Master's Degree in Adult Education.  Lives alone.   No siblings.    Current Outpatient Prescriptions on File Prior to Visit  Medication Sig Dispense Refill  . amLODipine (NORVASC) 10 MG tablet Take 1 tablet (10 mg total) by mouth daily. 90 tablet 3  . atenolol (TENORMIN) 50 MG tablet Take 1 tablet (50 mg total) by mouth daily. 90 tablet 3  . atorvastatin (LIPITOR) 40 MG tablet Take 1 tablet (40 mg total) by mouth daily. 90 tablet 3  . clobetasol ointment (TEMOVATE) 1.74 %  Apply 1 application topically 3 (three) times daily as needed (rash.). 60 g 1  . dapagliflozin propanediol (FARXIGA) 5 MG TABS tablet Take 5 mg by mouth daily. 90 tablet 3  . hydrALAZINE (APRESOLINE) 25 MG tablet Take 1 tablet (25 mg total) by mouth 3 (three) times daily. 90 tablet 1  . hydrochlorothiazide (HYDRODIURIL) 25 MG tablet Take 1 tablet (25 mg total) by mouth daily. 90 tablet 3  . metFORMIN (GLUCOPHAGE) 1000 MG tablet Take 1 tablet (1,000 mg total) by mouth 2 (two) times daily with a meal. 180 tablet 3  . naproxen sodium (ANAPROX) 220 MG tablet Take 440 mg by mouth 2 (two) times daily as needed (pain).    . ranitidine (ZANTAC) 150 MG tablet Take 1 tablet (150 mg total) by mouth every morning. 90 tablet 3  . B-D INS SYR ULTRAFINE 1CC/31G 31G X 5/16" 1 ML MISC USE AS DIRECTED (Patient not taking: Reported on 10/01/2016) 100 each 0  . chlorthalidone (HYGROTON) 25 MG tablet Take 1 tablet (25 mg total) by mouth daily. (Patient not taking: Reported on 10/01/2016) 90 tablet 3   No current facility-administered medications on file prior to visit.     Allergies  Allergen Reactions  . Losartan Potassium-Hctz Hives    Family  History  Problem Relation Age of Onset  . Stroke Mother   . Hypertension Mother   . Diabetes Maternal Grandmother   . Prostate cancer Maternal Grandfather   . Diabetes Other     BP (!) 142/92   Pulse 72   Ht 6' (1.829 m)   Wt (!) 363 lb (164.7 kg)   SpO2 97%   BMI 49.23 kg/m    Review of Systems denies weight loss, blurry vision, headache, chest pain, sob, n/v, urinary frequency, muscle cramps, excessive diaphoresis, depression, cold intolerance, rhinorrhea, and easy bruising.     Objective:   Physical Exam VS: see vs page GEN: no distress.  Morbid obesity HEAD: head: no deformity.  Lipoma at the right temporal area.   eyes: no periorbital swelling, no proptosis external nose and ears are normal mouth: no lesion seen NECK: supple, thyroid is not  enlarged CHEST WALL: no deformity LUNGS: clear to auscultation CV: reg rate and rhythm, no murmur ABD: abdomen is soft, nontender.  no hepatosplenomegaly.  not distended.  no hernia.   MUSCULOSKELETAL: muscle bulk and strength are grossly normal.  no obvious joint swelling.  gait is normal and steady.   EXTEMITIES: no deformity.  no ulcer on the feet.  feet are of normal color and temp.  1+ bilat leg edema, and bilat vv's.  There is bilateral onychomycosis of the toenails.  PULSES: dorsalis pedis intact bilat.  no carotid bruit.   NEURO:  cn 2-12 grossly intact.   readily moves all 4's.  sensation is intact to touch on the feet.   SKIN:  Normal texture and temperature.  No rash or suspicious lesion is visible.   NODES:  None palpable at the neck PSYCH: alert, well-oriented.  Does not appear anxious nor depressed.    Lab Results  Component Value Date   HGBA1C 8.7 (H) 08/16/2016   I personally reviewed electrocardiogram tracing (08/11/10): Indication: preop.  Impression: NSR.  No MI.  LVH. No comparison is available.      Assessment & Plan:  Insulin-requiring type 2 DM, with DR: he needs increased rx.   Obesity, new to me. HTN: recheck next time  Patient Instructions  good diet and exercise significantly improve the control of your diabetes.  please let me know if you wish to be referred to a dietician.  high blood sugar is very risky to your health.  you should see an eye doctor and dentist every year.  It is very important to get all recommended vaccinations.  Controlling your blood pressure and cholesterol drastically reduces the damage diabetes does to your body.  Those who smoke should quit.  Please discuss these with your doctor.  check your blood sugar twice a day.  vary the time of day when you check, between before the 3 meals, and at bedtime.  also check if you have symptoms of your blood sugar being too high or too low.  please keep a record of the readings and bring it to  your next appointment here (or you can bring the meter itself).  You can write it on any piece of paper.  please call us sooner if your blood sugar goes below 70, or if you have a lot of readings over 200.   For now, please: Please continue the same metformin, and: Please continue the same farxiga, and: Stop taking the glipizide, and: Reduce the levemir to 50 units, and take it in the evening, and:  Start novolog, 15 units 3 times  a day (just before each meal) Please call us next week, to tell us how the blood sugar is doing.  Please come back for a follow-up appointment in 2-4 weeks.      Bariatric Surgery You have so much to gain by losing weight.  You may have already tried every diet and exercise plan imaginable.  And, you may have sought advice from your family physician, too.   Sometimes, in spite of such diligent efforts, you may not be able to achieve long-term results by yourself.  In cases of severe obesity, bariatric or weight loss surgery is a proven method of achieving long-term weight control.  Our Services Our bariatric surgery programs offer our patients new hope and long-term weight-loss solution.  Since introducing our services in 2003, we have conducted more than 2,400 successful procedures.  Our program is designated as a Programmer, multimedia by the Metabolic and Bariatric Surgery Accreditation and Quality Improvement Program (MBSAQIP), a IT trainer that sets rigorous patient safety and outcome standards.  Our program is also designated as a Ecologist by [employer].   Our exceptional weight-loss surgery team specializes in diagnosis, treatment, follow-up care, and ongoing support for our patients with severe weight loss challenges.  We currently offer laparoscopic sleeve gastrectomy, gastric bypass, and adjustable gastric band (LAP-BAND).    Attend our [place] Choosing to undergo a bariatric procedure is a big decision, and  one that should not be taken lightly.  You now have two options in how you learn about weight-loss surgery - in person or online.  Our objective is to ensure you have all of the information that you need to evaluate the advantages and obligations of this life changing procedure.  Please note that you are not alone in this process, and our experienced team is ready to assist and answer all of your questions.  There are several ways to register for a seminar (either on-line or in person): 1)  Call [phone] 2) Go on-line to [facility] and register for either type of seminar.  [email or web address]

## 2016-10-08 NOTE — Telephone Encounter (Signed)
Call was disconnect but briefly spoke with patient. Waiting for a call back.

## 2016-10-08 NOTE — Telephone Encounter (Signed)
Spoke with patient and gave him above messages. He just wanted to know what to expect from his appointment today. Pt had no further questions.

## 2016-10-08 NOTE — Patient Instructions (Addendum)
good diet and exercise significantly improve the control of your diabetes.  please let me know if you wish to be referred to a dietician.  high blood sugar is very risky to your health.  you should see an eye doctor and dentist every year.  It is very important to get all recommended vaccinations.  Controlling your blood pressure and cholesterol drastically reduces the damage diabetes does to your body.  Those who smoke should quit.  Please discuss these with your doctor.  check your blood sugar twice a day.  vary the time of day when you check, between before the 3 meals, and at bedtime.  also check if you have symptoms of your blood sugar being too high or too low.  please keep a record of the readings and bring it to your next appointment here (or you can bring the meter itself).  You can write it on any piece of paper.  please call us sooner if your blood sugar goes below 70, or if you have a lot of readings over 200.   For now, please: Please continue the same metformin, and: Please continue the same farxiga, and: Stop taking the glipizide, and: Reduce the levemir to 50 units, and take it in the evening, and:  Start novolog, 15 units 3 times a day (just before each meal) Please call us next week, to tell us how the blood sugar is doing.  Please come back for a follow-up appointment in 2-4 weeks.      Bariatric Surgery You have so much to gain by losing weight.  You may have already tried every diet and exercise plan imaginable.  And, you may have sought advice from your family physician, too.   Sometimes, in spite of such diligent efforts, you may not be able to achieve long-term results by yourself.  In cases of severe obesity, bariatric or weight loss surgery is a proven method of achieving long-term weight control.  Our Services Our bariatric surgery programs offer our patients new hope and long-term weight-loss solution.  Since introducing our services in 2003, we have conducted more than  2,400 successful procedures.  Our program is designated as a Programmer, multimedia by the Metabolic and Bariatric Surgery Accreditation and Quality Improvement Program (MBSAQIP), a IT trainer that sets rigorous patient safety and outcome standards.  Our program is also designated as a Ecologist by [employer].   Our exceptional weight-loss surgery team specializes in diagnosis, treatment, follow-up care, and ongoing support for our patients with severe weight loss challenges.  We currently offer laparoscopic sleeve gastrectomy, gastric bypass, and adjustable gastric band (LAP-BAND).    Attend our [place] Choosing to undergo a bariatric procedure is a big decision, and one that should not be taken lightly.  You now have two options in how you learn about weight-loss surgery - in person or online.  Our objective is to ensure you have all of the information that you need to evaluate the advantages and obligations of this life changing procedure.  Please note that you are not alone in this process, and our experienced team is ready to assist and answer all of your questions.  There are several ways to register for a seminar (either on-line or in person): 1)  Call [phone] 2) Go on-line to [facility] and register for either type of seminar.  [email or web address]

## 2016-10-15 ENCOUNTER — Telehealth: Payer: Self-pay | Admitting: Endocrinology

## 2016-10-15 DIAGNOSIS — E669 Obesity, unspecified: Principal | ICD-10-CM

## 2016-10-15 DIAGNOSIS — E1169 Type 2 diabetes mellitus with other specified complication: Secondary | ICD-10-CM

## 2016-10-15 MED ORDER — "BD INSULIN SYR ULTRAFINE II 31G X 5/16"" 1 ML MISC": 0 refills | Status: DC

## 2016-10-15 MED ORDER — INSULIN ASPART 100 UNIT/ML ~~LOC~~ SOLN: 15.0000 [IU] | Freq: Three times a day (TID) | SUBCUTANEOUS | 11 refills | Status: DC

## 2016-10-15 NOTE — Telephone Encounter (Signed)
Justin Houston calling to get refills on the novolog vials instead of the pens the pt is requesting this change  He is in of syringes also please

## 2016-10-15 NOTE — Telephone Encounter (Signed)
Refills submitted.  

## 2016-10-23 ENCOUNTER — Ambulatory Visit (INDEPENDENT_AMBULATORY_CARE_PROVIDER_SITE_OTHER): Payer: BC Managed Care – PPO | Admitting: Physician Assistant

## 2016-10-23 ENCOUNTER — Encounter: Payer: Self-pay | Admitting: Physician Assistant

## 2016-10-23 ENCOUNTER — Ambulatory Visit (INDEPENDENT_AMBULATORY_CARE_PROVIDER_SITE_OTHER): Payer: BC Managed Care – PPO

## 2016-10-23 ENCOUNTER — Ambulatory Visit: Payer: BC Managed Care – PPO

## 2016-10-23 VITALS — BP 140/74 | HR 83 | Temp 98.6°F | Resp 18 | Ht 72.0 in | Wt 355.4 lb

## 2016-10-23 DIAGNOSIS — M25571 Pain in right ankle and joints of right foot: Secondary | ICD-10-CM

## 2016-10-23 MED ORDER — COLCHICINE 0.6 MG PO TABS: 0.6000 mg | ORAL_TABLET | Freq: Two times a day (BID) | ORAL | 0 refills | Status: DC | PRN

## 2016-10-23 MED ORDER — INDOMETHACIN 50 MG PO CAPS: 50.0000 mg | ORAL_CAPSULE | Freq: Three times a day (TID) | ORAL | 0 refills | Status: DC

## 2016-10-23 NOTE — Progress Notes (Signed)
Subjective:    Patient ID: Justin Houston, male    DOB: [birth year], 51 y.o.   MRN: [id number] PCP: Harrison Mons, PA-C   HPI Justin Houston is a 51 year old male presenting for pain in his right ankle.  Patient was walking down stairs five days ago, missed a step, and fell. He reports "twisting" his right ankle and scraped his left knee. Denies hearing a pop. Pain did not begin until two days ago. Describes it as achey and severity 8/10.  Worse with activity - painful to put weight on his right foot. Mild relief with soaking ankle in Epsom salts. Has taken Aleve in the past two days as well, which helped. "Aleve usually knocks out my gout."  He does report eating fish 2-3 days ago, which can flare up his gout.  No pain elsewhere.   Patient Active Problem List   Diagnosis Date Noted  . GERD (gastroesophageal reflux disease) 08/16/2016  . Diabetic retinopathy ([place]) 12/08/2014  . Colon cancer screening 11/10/2014  . Morbid obesity ([place]) 11/10/2014  . Umbilical hernia, incarcerated 09/29/2013  . Mass of head, right postauricular SQ 7x4cm 09/29/2013  . Glaucoma 05/26/2013  . Gout 08/01/2012  . BMI 45.0-49.9, adult ([place]) 08/01/2012  . Hypertension 07/20/2011  . Diabetes mellitus type 2 in obese ([place]) 07/20/2011  . Hyperlipidemia 07/20/2011   Prior to Admission medications   Medication Sig Start Date End Date Taking? Authorizing Provider  amLODipine (NORVASC) 10 MG tablet Take 1 tablet (10 mg total) by mouth daily. 08/16/16  Yes Jeffery, Chelle, PA-C  atenolol (TENORMIN) 50 MG tablet Take 1 tablet (50 mg total) by mouth daily. 08/16/16  Yes Jeffery, Chelle, PA-C  atorvastatin (LIPITOR) 40 MG tablet Take 1 tablet (40 mg total) by mouth daily. 08/16/16  Yes Jeffery, Domingo Mend, PA-C  B-D INS SYR ULTRAFINE 1CC/31G 31G X 5/16" 1 ML MISC USE AS DIRECTED 10/15/16  Yes Renato Shin, MD  chlorthalidone (HYGROTON) 25 MG tablet Take 1 tablet (25 mg total) by mouth daily. 08/16/16  Yes Jeffery,  Chelle, PA-C  clobetasol ointment (TEMOVATE) 9.56 % Apply 1 application topically 3 (three) times daily as needed (rash.). 03/04/15  Yes Jeffery, Chelle, PA-C  dapagliflozin propanediol (FARXIGA) 5 MG TABS tablet Take 5 mg by mouth daily. 08/16/16  Yes Jeffery, Chelle, PA-C  glucose blood (CONTOUR NEXT TEST) test strip 1 each by Other route 2 (two) times daily. And lancets 2/day 10/08/16  Yes Renato Shin, MD  hydrALAZINE (APRESOLINE) 25 MG tablet Take 1 tablet (25 mg total) by mouth 3 (three) times daily. 08/16/16  Yes Jeffery, Chelle, PA-C  insulin aspart (NOVOLOG) 100 UNIT/ML injection Inject 15 Units into the skin 3 (three) times daily before meals. And syringes 3/day 10/15/16  Yes Renato Shin, MD  insulin detemir (LEVEMIR) 100 UNIT/ML injection Inject 0.5 mLs (50 Units total) into the skin at bedtime. 10/08/16  Yes Renato Shin, MD  metFORMIN (GLUCOPHAGE) 1000 MG tablet Take 1 tablet (1,000 mg total) by mouth 2 (two) times daily with a meal. 08/16/16  Yes Jeffery, Chelle, PA-C  naproxen sodium (ANAPROX) 220 MG tablet Take 440 mg by mouth 2 (two) times daily as needed (pain).   Yes [provider]  ranitidine (ZANTAC) 150 MG tablet Take 1 tablet (150 mg total) by mouth every morning. 08/16/16  Yes Harrison Mons, PA-C    Allergies  Allergen Reactions  . Losartan Potassium-Hctz Hives    Review of Systems See above.    Objective:   Physical Exam  Constitutional: He appears well-developed and well-nourished.  BP (!) 161/72 (BP Location: Right Wrist, Patient Position: Sitting, Cuff Size: Large)   Pulse 83   Temp 98.6 F (37 C) (Oral)   Resp 18   Ht 6' (1.829 m)   Wt (!) 355 lb 6.4 oz (161.2 kg)   SpO2 97%   BMI 48.20 kg/m    HENT:  Head: Normocephalic and atraumatic.  Right Ear: External ear normal.  Left Ear: External ear normal.  Nose: Nose normal.  Neck: Normal range of motion. Neck supple.  Cardiovascular: Normal rate, regular rhythm and normal heart sounds.   Pulses:       Radial pulses are 2+ on the right side, and 2+ on the left side.       Posterior tibial pulses are 2+ on the right side, and 2+ on the left side.  Pulmonary/Chest: Effort normal and breath sounds normal.  Musculoskeletal:       Right ankle: He exhibits decreased range of motion and swelling. He exhibits no ecchymosis. No tenderness.       Left ankle: Normal. He exhibits normal range of motion, no swelling, no ecchymosis and no deformity. No tenderness.  Right ankle is warm to touch with significant lateral edema.  Neurological: He is alert.  Skin: Skin is warm and dry. No rash noted. No erythema.  Psychiatric: He has a normal mood and affect. His behavior is normal.       Assessment & Plan:   Acute right ankle pain - Plan: DG Ankle Complete Right  1. Acute right ankle pain Radiographs negatives. Will treat for gout with Indomethacin 50mg  TID. Follow up in 1 month. Will draw uric acid levels then. - DG Ankle Complete Right   Respectfully, Weldon Picking, PA-S

## 2016-10-23 NOTE — Patient Instructions (Addendum)
Keep using your cane. Drink plenty of water. Elevate the foot as much as possible.    IF you received an x-ray today, you will receive an invoice from [facility]. Please contact [facility] at [phone] with questions or concerns regarding your invoice.   IF you received labwork today, you will receive an invoice from [facility]. Please contact [facility] at [phone] with questions or concerns regarding your invoice.   Our billing staff will not be able to assist you with questions regarding bills from these companies.  You will be contacted with the lab results as soon as they are available. The fastest way to get your results is to activate your My Chart account. Instructions are located on the last page of this paperwork. If you have not heard from [place] regarding the results in 2 weeks, please contact this office.

## 2016-10-23 NOTE — Progress Notes (Signed)
Patient ID: Justin Houston, male    DOB: 08-20-1965, 51 y.o.   MRN: [id number]  PCP: Harrison Mons, PA-C  Chief Complaint  Patient presents with  . Ankle Pain    pain in R ankle, patient states that he fell and twisted ankle 4 days ago but felt fine. Per patient, ankle began to swell appx 2 days ago    Subjective:   Presents for evaluation of RIGHT ankle pain.  He twisted the RIGHT ankle while descending stairs 4 days ago. Immediately following, he felt fine, but then 2 days ago developed swelling and pain of the lateral RIGHT ankle. He wonders if it may actually be gout.  He rates the pain 8/10. Mild relief with soaking the foot in Epsom salts. Worse with weight bearing. Aleve typically treats his gout pain easily, but he has had no relief this time. He notes having eaten fish the day prior to the onset of his symptoms, which is a known trigger for his previous gout attacks.    Review of Systems As above.   Patient Active Problem List   Diagnosis Date Noted  . GERD (gastroesophageal reflux disease) 08/16/2016  . Diabetic retinopathy ([place]) 12/08/2014  . Colon cancer screening 11/10/2014  . Morbid obesity ([place]) 11/10/2014  . Umbilical hernia, incarcerated 09/29/2013  . Mass of head, right postauricular SQ 7x4cm 09/29/2013  . Glaucoma 05/26/2013  . Gout 08/01/2012  . BMI 45.0-49.9, adult ([place]) 08/01/2012  . Hypertension 07/20/2011  . Diabetes mellitus type 2 in obese ([place]) 07/20/2011  . Hyperlipidemia 07/20/2011     Prior to Admission medications   Medication Sig Start Date End Date Taking? Authorizing Provider  amLODipine (NORVASC) 10 MG tablet Take 1 tablet (10 mg total) by mouth daily. 08/16/16  Yes Rubbie Goostree, PA-C  atenolol (TENORMIN) 50 MG tablet Take 1 tablet (50 mg total) by mouth daily. 08/16/16  Yes Braelin Brosch, PA-C  atorvastatin (LIPITOR) 40 MG tablet Take 1 tablet (40 mg total) by mouth daily. 08/16/16  Yes Ranessa Kosta, Domingo Mend, PA-C  B-D INS SYR  ULTRAFINE 1CC/31G 31G X 5/16" 1 ML MISC USE AS DIRECTED 10/15/16  Yes Renato Shin, MD  chlorthalidone (HYGROTON) 25 MG tablet Take 1 tablet (25 mg total) by mouth daily. 08/16/16  Yes Lashea Goda, PA-C  clobetasol ointment (TEMOVATE) 7.82 % Apply 1 application topically 3 (three) times daily as needed (rash.). 03/04/15  Yes Madalee Altmann, PA-C  dapagliflozin propanediol (FARXIGA) 5 MG TABS tablet Take 5 mg by mouth daily. 08/16/16  Yes Andrianna Manalang, PA-C  glucose blood (CONTOUR NEXT TEST) test strip 1 each by Other route 2 (two) times daily. And lancets 2/day 10/08/16  Yes Renato Shin, MD  hydrALAZINE (APRESOLINE) 25 MG tablet Take 1 tablet (25 mg total) by mouth 3 (three) times daily. 08/16/16  Yes Jennier Schissler, PA-C  insulin aspart (NOVOLOG) 100 UNIT/ML injection Inject 15 Units into the skin 3 (three) times daily before meals. And syringes 3/day 10/15/16  Yes Renato Shin, MD  insulin detemir (LEVEMIR) 100 UNIT/ML injection Inject 0.5 mLs (50 Units total) into the skin at bedtime. 10/08/16  Yes Renato Shin, MD  metFORMIN (GLUCOPHAGE) 1000 MG tablet Take 1 tablet (1,000 mg total) by mouth 2 (two) times daily with a meal. 08/16/16  Yes Charlisa Cham, PA-C  naproxen sodium (ANAPROX) 220 MG tablet Take 440 mg by mouth 2 (two) times daily as needed (pain).   Yes [provider]  ranitidine (ZANTAC) 150 MG tablet Take 1  tablet (150 mg total) by mouth every morning. 08/16/16  Yes Harrison Mons, PA-C     Allergies  Allergen Reactions  . Losartan Potassium-Hctz Hives       Objective:  Physical Exam  Constitutional: He is oriented to person, place, and time. He appears well-developed and well-nourished. He is active and cooperative. No distress.  BP 140/74 (BP Location: Right Arm, Patient Position: Sitting, Cuff Size: Large)   Pulse 83   Temp 98.6 F (37 C) (Oral)   Resp 18   Ht 6' (1.829 m)   Wt (!) 355 lb 6.4 oz (161.2 kg)   SpO2 97%   BMI 48.20 kg/m    Eyes: Conjunctivae  are normal.  Pulmonary/Chest: Effort normal.  Musculoskeletal:       Right knee: Normal.       Right ankle: He exhibits decreased range of motion and swelling. He exhibits no ecchymosis, no deformity, no laceration and normal pulse. Tenderness. Lateral malleolus tenderness found. Achilles tendon normal.       Right lower leg: Normal.       Right foot: Normal.  Neurological: He is alert and oriented to person, place, and time.  Psychiatric: He has a normal mood and affect. His speech is normal and behavior is normal.       Dg Ankle Complete Right  Result Date: 10/23/2016 CLINICAL DATA:  Right ankle pain and swelling after twist injury. EXAM: RIGHT ANKLE - COMPLETE 3+ VIEW COMPARISON:  None. FINDINGS: There is no evidence of fracture, dislocation, or joint effusion. Ankle mortise is preserved. 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 Tiny plantar calcaneal spur. Diffuse soft tissue edema. Phleboliths in the soft tissues. IMPRESSION: Soft tissue edema.  No acute fracture. Electronically Signed   By: Jeb Levering M.D.   On: 10/23/2016 18:53       Assessment & Plan:   1. Acute right ankle pain Gout vs sprain. Rest, elevate. Plan uric acid level next month at his routine follow-up for diabetes, HTN, lipids. - DG Ankle Complete Right; Future - indomethacin (INDOCIN) 50 MG capsule; Take 1 capsule (50 mg total) by mouth 3 (three) times daily with meals.  Dispense: 30 capsule; Refill: 0 - colchicine 0.6 MG tablet; Take 1 tablet (0.6 mg total) by mouth 2 (two) times daily as needed.  Dispense: 10 tablet; Refill: 0    Return as planned.Fara Chute, PA-C [facility]

## 2016-10-24 ENCOUNTER — Telehealth: Payer: Self-pay | Admitting: Physician Assistant

## 2016-10-24 NOTE — Telephone Encounter (Signed)
Pt states that he is unable to pick up his colchicine 0.6 MG tablet because the price is to high. He wanted to know if there was anything else you can call in for him.  Contact number [phone]

## 2016-10-24 NOTE — Telephone Encounter (Signed)
I spoke with the pharmacist. The $30 for #10 tablets seems pricey. She ran it through as #30 tabs and #60 tabs, and the patient's cost will remain $30. I am happy to send a new prescription for a larger supply, as that would be added value for him (reduces the price per pill from $3 to $0.50). If he simply doesn't have the money, I recommend that he stick with the indomethacin. It will take longer to improve, but should still work.

## 2016-10-24 NOTE — Telephone Encounter (Signed)
Chelle, please  see this message. Thanks.

## 2016-10-25 NOTE — Telephone Encounter (Signed)
CHELLE PT CALLING STATING THAT HE TOOK THE INDOCIN AND THE SWELLING HAS WENT DOWN AND HE'S NO LONGER WALKING WITH A CANE

## 2016-10-25 NOTE — Telephone Encounter (Signed)
Left Pt a message with the previous message advise. I instructed the pt to call back if he has any questions or concerns.

## 2016-11-06 ENCOUNTER — Ambulatory Visit (INDEPENDENT_AMBULATORY_CARE_PROVIDER_SITE_OTHER): Payer: BC Managed Care – PPO | Admitting: Endocrinology

## 2016-11-06 ENCOUNTER — Encounter: Payer: Self-pay | Admitting: Endocrinology

## 2016-11-06 VITALS — BP 150/86 | HR 51 | Wt 358.2 lb

## 2016-11-06 DIAGNOSIS — E669 Obesity, unspecified: Secondary | ICD-10-CM

## 2016-11-06 DIAGNOSIS — E1169 Type 2 diabetes mellitus with other specified complication: Secondary | ICD-10-CM

## 2016-11-06 LAB — POCT GLYCOSYLATED HEMOGLOBIN (HGB A1C): HEMOGLOBIN A1C: 6.9

## 2016-11-06 MED ORDER — INSULIN DETEMIR 100 UNIT/ML ~~LOC~~ SOLN: 20.0000 [IU] | Freq: Every day | SUBCUTANEOUS | 2 refills | Status: DC

## 2016-11-06 MED ORDER — INSULIN ASPART 100 UNIT/ML ~~LOC~~ SOLN: 5.0000 [IU] | Freq: Three times a day (TID) | SUBCUTANEOUS | 11 refills | Status: DC

## 2016-11-06 NOTE — Patient Instructions (Addendum)
check your blood sugar twice a day.  vary the time of day when you check, between before the 3 meals, and at bedtime.  also check if you have symptoms of your blood sugar being too high or too low.  please keep a record of the readings and bring it to your next appointment here (or you can bring the meter itself).  You can write it on any piece of paper.  please call us sooner if your blood sugar goes below 70, or if you have a lot of readings over 200.   For now, please: Please continue the same metformin, and: Please continue the same farxiga, and: Reduce the levemir to 20 units, and take it in the evening, and:  Start novolog, 5 units 3 times a day (just before each meal) Here is a discount card, for the strips.  Please come back for a follow-up appointment in 2 months.

## 2016-11-06 NOTE — Progress Notes (Signed)
Subjective:    Patient ID: Justin Houston, male    DOB: [birth year], 51 y.o.   MRN: [id number]  HPI Pt returns for f/u of diabetes mellitus: DM type:  Dx'ed: 9833 Complications: NPDR Therapy: insulin since soon after dx DKA: never Severe hypoglycemia: never Pancreatitis: never Pancreatic imaging: never  Other: he takes qd  Insulin, at least for now Interval history: He does not check cbg's, due for a high copay, for the strips.  He now takes levemir 50 qhs, and the 2 oral meds.  He does not take novolog.   Past Medical History:  Diagnosis Date  . Dermatitis   . Diabetes mellitus   . GERD (gastroesophageal reflux disease)   . Hyperlipidemia   . Hypertension   . Lipoma of scalp   . Obesity     Past Surgical History:  Procedure Laterality Date  . COLONOSCOPY WITH PROPOFOL N/A 12/01/2014   Procedure: COLONOSCOPY WITH PROPOFOL;  Surgeon: [name], MD;  Location: [facility];  Service: Endoscopy;  Laterality: N/A;  . right arm ORIF      Social History   Social History  . Marital status: Single    Spouse name: n/a  . Number of children: 0  . Years of education: 70   Occupational History  . [place] History Main Topics  . Smoking status: Former Smoker    Quit date: 09/05/1994  . Smokeless tobacco: Never Used  . Alcohol use No  . Drug use: No  . Sexual activity: No   Other Topics Concern  . Not on file   Social History Narrative   Master's Degree in Adult Education.  Lives alone.   No siblings.    Current Outpatient Prescriptions on File Prior to Visit  Medication Sig Dispense Refill  . amLODipine (NORVASC) 10 MG tablet Take 1 tablet (10 mg total) by mouth daily. 90 tablet 3  . atenolol (TENORMIN) 50 MG tablet Take 1 tablet (50 mg total) by mouth daily. 90 tablet 3  . atorvastatin (LIPITOR) 40 MG tablet Take 1 tablet (40 mg total) by mouth daily. 90 tablet 3  . B-D INS SYR ULTRAFINE 1CC/31G 31G X 5/16" 1 ML  MISC USE AS DIRECTED 100 each 0  . chlorthalidone (HYGROTON) 25 MG tablet Take 1 tablet (25 mg total) by mouth daily. 90 tablet 3  . clobetasol ointment (TEMOVATE) 8.25 % Apply 1 application topically 3 (three) times daily as needed (rash.). 60 g 1  . colchicine 0.6 MG tablet Take 1 tablet (0.6 mg total) by mouth 2 (two) times daily as needed. 10 tablet 0  . dapagliflozin propanediol (FARXIGA) 5 MG TABS tablet Take 5 mg by mouth daily. 90 tablet 3  . glucose blood (CONTOUR NEXT TEST) test strip 1 each by Other route 2 (two) times daily. And lancets 2/day 100 each 12  . hydrALAZINE (APRESOLINE) 25 MG tablet Take 1 tablet (25 mg total) by mouth 3 (three) times daily. 90 tablet 1  . indomethacin (INDOCIN) 50 MG capsule Take 1 capsule (50 mg total) by mouth 3 (three) times daily with meals. 30 capsule 0  . metFORMIN (GLUCOPHAGE) 1000 MG tablet Take 1 tablet (1,000 mg total) by mouth 2 (two) times daily with a meal. 180 tablet 3  . naproxen sodium (ANAPROX) 220 MG tablet Take 440 mg by mouth 2 (two) times daily as needed (pain).    . ranitidine (ZANTAC) 150 MG tablet Take 1 tablet (150  mg total) by mouth every morning. 90 tablet 3   No current facility-administered medications on file prior to visit.     Allergies  Allergen Reactions  . Losartan Potassium-Hctz Hives    Family History  Problem Relation Age of Onset  . Stroke Mother   . Hypertension Mother   . Diabetes Maternal Grandmother   . Prostate cancer Maternal Grandfather   . Diabetes Other     BP (!) 150/86   Pulse (!) 51   Wt (!) 358 lb 3.2 oz (162.5 kg)   SpO2 98%   BMI 48.58 kg/m   Review of Systems He denies hypoglycemia    Objective:   Physical Exam VITAL SIGNS:  See vs page GENERAL: no distress Pulses: foot pulses are intact bilaterally.   MSK: no deformity of the feet or ankles.  CV: 1+ bilat edema of the legs, and large bilat vv's of the legs Skin:  no ulcer on the feet or ankles.  normal temp on the feet and  ankles.  There is patchy hyperpigmentation of the feet Neuro: sensation is intact to touch on the feet and ankles.   Ext: There is bilateral onychomycosis of the toenails.    Lab Results  Component Value Date   HGBA1C 6.9 11/06/2016      Assessment & Plan:  Insulin-requiring type 2 DM, with: he needs multiple daily injections, in order to control glucose without hypoglycemia.   Noncompliance with cbg's. This complicates the rx of DM.   Bradycardia: prob due to b-blocker.  HTN: he needs f/u.  Pt is advised to f/u with PCP  Patient Instructions  check your blood sugar twice a day.  vary the time of day when you check, between before the 3 meals, and at bedtime.  also check if you have symptoms of your blood sugar being too high or too low.  please keep a record of the readings and bring it to your next appointment here (or you can bring the meter itself).  You can write it on any piece of paper.  please call us sooner if your blood sugar goes below 70, or if you have a lot of readings over 200.   For now, please: Please continue the same metformin, and: Please continue the same farxiga, and: Reduce the levemir to 20 units, and take it in the evening, and:  Start novolog, 5 units 3 times a day (just before each meal) Here is a discount card, for the strips.  Please come back for a follow-up appointment in 2 months.

## 2016-11-06 NOTE — Progress Notes (Signed)
68 

## 2016-11-08 ENCOUNTER — Telehealth: Payer: Self-pay | Admitting: Endocrinology

## 2016-11-08 NOTE — Telephone Encounter (Signed)
please call patient: Your BP is high and heart rate is slow. Please see PCP soon, to f/u these.

## 2016-11-11 NOTE — Telephone Encounter (Signed)
I contacted the patient and advised of message via voicemail. I requested a call back from the patient to discuss further if need be.

## 2016-11-12 ENCOUNTER — Encounter: Payer: Self-pay | Admitting: Physician Assistant

## 2016-11-12 ENCOUNTER — Ambulatory Visit (INDEPENDENT_AMBULATORY_CARE_PROVIDER_SITE_OTHER): Payer: BC Managed Care – PPO | Admitting: Physician Assistant

## 2016-11-12 VITALS — BP 130/84 | HR 68 | Temp 98.5°F | Resp 18 | Ht 72.0 in | Wt 348.2 lb

## 2016-11-12 DIAGNOSIS — E785 Hyperlipidemia, unspecified: Secondary | ICD-10-CM | POA: Diagnosis not present

## 2016-11-12 DIAGNOSIS — E669 Obesity, unspecified: Secondary | ICD-10-CM | POA: Diagnosis not present

## 2016-11-12 DIAGNOSIS — Z6841 Body Mass Index (BMI) 40.0 and over, adult: Secondary | ICD-10-CM | POA: Diagnosis not present

## 2016-11-12 DIAGNOSIS — I10 Essential (primary) hypertension: Secondary | ICD-10-CM

## 2016-11-12 DIAGNOSIS — E1169 Type 2 diabetes mellitus with other specified complication: Secondary | ICD-10-CM | POA: Diagnosis not present

## 2016-11-12 NOTE — Patient Instructions (Signed)
     IF you received an x-ray today, you will receive an invoice from [facility]. Please contact  Radiology at [phone] with questions or concerns regarding your invoice.   IF you received labwork today, you will receive an invoice from [facility]. Please contact [facility] at [phone] with questions or concerns regarding your invoice.   Our billing staff will not be able to assist you with questions regarding bills from these companies.  You will be contacted with the lab results as soon as they are available. The fastest way to get your results is to activate your My Chart account. Instructions are located on the last page of this paperwork. If you have not heard from us regarding the results in 2 weeks, please contact this office.     

## 2016-11-12 NOTE — Progress Notes (Signed)
Patient ID: Justin Houston, male    DOB: [birth year], 51 y.o.   MRN: [id number]  PCP: Harrison Mons, PA-C  Chief Complaint  Patient presents with  . Hypertension  . Diabetes    per pt states sugars have been good at home in the 104s  . Hyperlipidemia  . Follow-up    Subjective:   Presents for evaluation of diabetes, HTN, lipids.   Saw his eye specialist yesterday. Glaucoma is bilateral now.  Sees DDS next month.  Saw endocrinology last week and has a follow-up in September. Blood pressure was 150/86 and pulse was 51. He was advised to follow up with me. A1C in 08/2016 was stable at 8.7%. Home glucose 100's.  He is working on lifestyle changes. Exercising by walking at a track 3 x/week. 35 minutes/mile.   Review of Systems Constitutional: Negative for chills, fever and malaise/fatigue.  HENT: Negative for ear pain, hearing loss, sinus pain and sore throat.   Eyes: Negative for blurred vision and double vision.  Respiratory: Negative for cough, shortness of breath and wheezing.   Cardiovascular: Negative for chest pain and palpitations.  Gastrointestinal: Negative for constipation, diarrhea, heartburn, nausea and vomiting.  Genitourinary: Positive for frequency (attributes to BP medications).  Musculoskeletal: Positive for joint pain (right knee ).  Neurological: Negative for dizziness, tingling, weakness and headaches.  Endo/Heme/Allergies: Does not bruise/bleed easily.  Psychiatric/Behavioral: Negative for depression and suicidal ideas.     Patient Active Problem List   Diagnosis Date Noted  . GERD (gastroesophageal reflux disease) 08/16/2016  . Diabetic retinopathy ([place]) 12/08/2014  . Colon cancer screening 11/10/2014  . Morbid obesity ([place]) 11/10/2014  . Umbilical hernia, incarcerated 09/29/2013  . Mass of head, right postauricular SQ 7x4cm 09/29/2013  . Glaucoma 05/26/2013  . Gout 08/01/2012  . BMI 45.0-49.9, adult ([place]) 08/01/2012  . Hypertension  07/20/2011  . Diabetes mellitus type 2 in obese ([place]) 07/20/2011  . Hyperlipidemia 07/20/2011     Prior to Admission medications   Medication Sig Start Date End Date Taking? Authorizing Provider  amLODipine (NORVASC) 10 MG tablet Take 1 tablet (10 mg total) by mouth daily. 08/16/16  Yes Amillion Macchia, PA-C  atenolol (TENORMIN) 50 MG tablet Take 1 tablet (50 mg total) by mouth daily. 08/16/16  Yes Cordon Gassett, PA-C  atorvastatin (LIPITOR) 40 MG tablet Take 1 tablet (40 mg total) by mouth daily. 08/16/16  Yes Hema Lanza, Domingo Mend, PA-C  B-D INS SYR ULTRAFINE 1CC/31G 31G X 5/16" 1 ML MISC USE AS DIRECTED 10/15/16  Yes Renato Shin, MD  chlorthalidone (HYGROTON) 25 MG tablet Take 1 tablet (25 mg total) by mouth daily. 08/16/16  Yes Estelle Greenleaf, PA-C  clobetasol ointment (TEMOVATE) 8.46 % Apply 1 application topically 3 (three) times daily as needed (rash.). 03/04/15  Yes Lillien Petronio, PA-C  colchicine 0.6 MG tablet Take 1 tablet (0.6 mg total) by mouth 2 (two) times daily as needed. 10/23/16  Yes Ieisha Gao, PA-C  dapagliflozin propanediol (FARXIGA) 5 MG TABS tablet Take 5 mg by mouth daily. 08/16/16  Yes Allyiah Gartner, PA-C  glucose blood (CONTOUR NEXT TEST) test strip 1 each by Other route 2 (two) times daily. And lancets 2/day 10/08/16  Yes Renato Shin, MD  hydrALAZINE (APRESOLINE) 25 MG tablet Take 1 tablet (25 mg total) by mouth 3 (three) times daily. 08/16/16  Yes Kristeen Lantz, PA-C  indomethacin (INDOCIN) 50 MG capsule Take 1 capsule (50 mg total) by mouth 3 (three) times daily with meals. 10/23/16  Yes  Steele Stracener, PA-C  insulin aspart (NOVOLOG) 100 UNIT/ML injection Inject 5 Units into the skin 3 (three) times daily before meals. And syringes 3/day 11/06/16  Yes Renato Shin, MD  insulin detemir (LEVEMIR) 100 UNIT/ML injection Inject 0.2 mLs (20 Units total) into the skin at bedtime. 11/06/16  Yes Renato Shin, MD  metFORMIN (GLUCOPHAGE) 1000 MG tablet Take 1 tablet (1,000 mg total)  by mouth 2 (two) times daily with a meal. 08/16/16  Yes Heath Tesler, PA-C  naproxen sodium (ANAPROX) 220 MG tablet Take 440 mg by mouth 2 (two) times daily as needed (pain).   Yes [provider]  ranitidine (ZANTAC) 150 MG tablet Take 1 tablet (150 mg total) by mouth every morning. 08/16/16  Yes Harrison Mons, PA-C     Allergies  Allergen Reactions  . Losartan Potassium-Hctz Hives       Objective:  Physical Exam  Constitutional: He is oriented to person, place, and time. He appears well-developed and well-nourished. He is active and cooperative. No distress.  BP 130/84   Pulse 68   Temp 98.5 F (36.9 C) (Oral)   Resp 18   Ht 6' (1.829 m)   Wt (!) 348 lb 3.2 oz (157.9 kg)   SpO2 97%   BMI 47.22 kg/m   HENT:  Head: Normocephalic and atraumatic.  Right Ear: Hearing normal.  Left Ear: Hearing normal.  Eyes: Conjunctivae are normal. No scleral icterus.  Neck: Normal range of motion. Neck supple. No thyromegaly present.  Cardiovascular: Normal rate, regular rhythm and normal heart sounds.   Pulses:      Radial pulses are 2+ on the right side, and 2+ on the left side.  Pulmonary/Chest: Effort normal and breath sounds normal.  Lymphadenopathy:       Head (right side): No tonsillar, no preauricular, no posterior auricular and no occipital adenopathy present.       Head (left side): No tonsillar, no preauricular, no posterior auricular and no occipital adenopathy present.    He has no cervical adenopathy.       Right: No supraclavicular adenopathy present.       Left: No supraclavicular adenopathy present.  Neurological: He is alert and oriented to person, place, and time. No sensory deficit.  Skin: Skin is warm, dry and intact. No rash noted. No cyanosis or erythema. Nails show no clubbing.  Large lipoma of the RIGHT posterior scalp.  Psychiatric: He has a normal mood and affect. His speech is normal and behavior is normal.       Assessment & Plan:   Problem List  Items Addressed This Visit    Hypertension    Well controlled today. He is on  4 agents. If BP increases, or is elevated consistently at future visits, will plan to refer to cardiology and/or nephrology. Advised that weight loss may help in this regard.      Diabetes mellitus type 2 in obese [facility]) - Primary    Continue follow-up per endocrinology.      Relevant Orders   Hemoglobin A1c (Completed)   Comprehensive metabolic panel (Completed)   Microalbumin, urine (Completed)   Hyperlipidemia    Await labs. Adjust regimen as indicated by results. Goal LDL <70      Relevant Orders   Lipid panel (Completed)   BMI 45.0-49.9, adult (HCC)    Exercise is limited due to knee pain, but weight loss will have a positive impact on DM, lipids, HTN and knee pain.      Relevant  Orders   Amb Ref to Medical Weight Management       Return in about 3 months (around 02/12/2017) for HTN, lipids, diabetes, etc..   Fara Chute, PA-C [facility]

## 2016-11-12 NOTE — Progress Notes (Signed)
Chief Complaint  Patient presents with  . Hypertension  . Diabetes    per pt states sugars have been good at home in the 104s  . Hyperlipidemia  . Follow-up   HPI Justin Houston is a 51 yo male with a significant past medical history of hypertension, Type 2 Diabetes, hyperlipidemia who present today for a follow up visit on these conditions.  Patient is fasting today. Labs drawn on 08/16/2016 were stable, with a Hgb A1c of 8.7.  Diabetes Glucose readings at home have been in the 104 range. He saw his eye specialist yesterday (11/11/2016), and goes every 6 months for an appointment. He also saw Dr. Renato Shin at [facility] on July 25th for management of his diabetes. At the appointment, Dr. Loanne Drilling was concerned about his blood pressure and pulse, which were 150/86 and 51 respectively.  He is trying to exercise more frequently. He reports walking around a track 3 times a week. 1 mile takes him 35 minutes.  Review of Systems  Constitutional: Negative for chills, fever and malaise/fatigue.  HENT: Negative for ear pain, hearing loss, sinus pain and sore throat.   Eyes: Negative for blurred vision and double vision.  Respiratory: Negative for cough, shortness of breath and wheezing.   Cardiovascular: Negative for chest pain and palpitations.  Gastrointestinal: Negative for constipation, diarrhea, heartburn, nausea and vomiting.  Genitourinary: Positive for frequency (attributes to BP medications).  Musculoskeletal: Positive for joint pain (right knee ).  Neurological: Negative for dizziness, tingling, weakness and headaches.  Endo/Heme/Allergies: Does not bruise/bleed easily.  Psychiatric/Behavioral: Negative for depression and suicidal ideas.   Patient Active Problem List   Diagnosis Date Noted  . GERD (gastroesophageal reflux disease) 08/16/2016  . Diabetic retinopathy ([place]) 12/08/2014  . Colon cancer screening 11/10/2014  . Morbid obesity ([place]) 11/10/2014  . Umbilical  hernia, incarcerated 09/29/2013  . Mass of head, right postauricular SQ 7x4cm 09/29/2013  . Glaucoma 05/26/2013  . Gout 08/01/2012  . BMI 45.0-49.9, adult ([place]) 08/01/2012  . Hypertension 07/20/2011  . Diabetes mellitus type 2 in obese ([place]) 07/20/2011  . Hyperlipidemia 07/20/2011   Allergies  Allergen Reactions  . Losartan Potassium-Hctz Hives   Prior to Admission medications   Medication Sig Start Date End Date Taking? Authorizing Provider  amLODipine (NORVASC) 10 MG tablet Take 1 tablet (10 mg total) by mouth daily. 08/16/16  Yes Jeffery, Chelle, PA-C  atenolol (TENORMIN) 50 MG tablet Take 1 tablet (50 mg total) by mouth daily. 08/16/16  Yes Jeffery, Chelle, PA-C  atorvastatin (LIPITOR) 40 MG tablet Take 1 tablet (40 mg total) by mouth daily. 08/16/16  Yes Jeffery, Domingo Mend, PA-C  B-D INS SYR ULTRAFINE 1CC/31G 31G X 5/16" 1 ML MISC USE AS DIRECTED 10/15/16  Yes Renato Shin, MD  chlorthalidone (HYGROTON) 25 MG tablet Take 1 tablet (25 mg total) by mouth daily. 08/16/16  Yes Jeffery, Chelle, PA-C  clobetasol ointment (TEMOVATE) 7.25 % Apply 1 application topically 3 (three) times daily as needed (rash.). 03/04/15  Yes Jeffery, Chelle, PA-C  colchicine 0.6 MG tablet Take 1 tablet (0.6 mg total) by mouth 2 (two) times daily as needed. 10/23/16  Yes Jeffery, Chelle, PA-C  dapagliflozin propanediol (FARXIGA) 5 MG TABS tablet Take 5 mg by mouth daily. 08/16/16  Yes Jeffery, Chelle, PA-C  glucose blood (CONTOUR NEXT TEST) test strip 1 each by Other route 2 (two) times daily. And lancets 2/day 10/08/16  Yes Renato Shin, MD  hydrALAZINE (APRESOLINE) 25 MG tablet Take 1 tablet (25 mg total)  by mouth 3 (three) times daily. 08/16/16  Yes Jeffery, Chelle, PA-C  indomethacin (INDOCIN) 50 MG capsule Take 1 capsule (50 mg total) by mouth 3 (three) times daily with meals. 10/23/16  Yes Jeffery, Chelle, PA-C  insulin aspart (NOVOLOG) 100 UNIT/ML injection Inject 5 Units into the skin 3 (three) times daily before meals.  And syringes 3/day 11/06/16  Yes Renato Shin, MD  insulin detemir (LEVEMIR) 100 UNIT/ML injection Inject 0.2 mLs (20 Units total) into the skin at bedtime. 11/06/16  Yes Renato Shin, MD  metFORMIN (GLUCOPHAGE) 1000 MG tablet Take 1 tablet (1,000 mg total) by mouth 2 (two) times daily with a meal. 08/16/16  Yes Jeffery, Chelle, PA-C  naproxen sodium (ANAPROX) 220 MG tablet Take 440 mg by mouth 2 (two) times daily as needed (pain).   Yes [provider]  ranitidine (ZANTAC) 150 MG tablet Take 1 tablet (150 mg total) by mouth every morning. 08/16/16  Yes Jeffery, Chelle, PA-C   Vital Signs BP 130/84   Pulse 68   Temp 98.5 F (36.9 C) (Oral)   Resp 18   Ht 6' (1.829 m)   Wt (!) 348 lb 3.2 oz (157.9 kg)   SpO2 97%   BMI 47.22 kg/m   Physical Exam  Constitutional: He appears well-developed and well-nourished. He is cooperative.  Eyes:  Fundoscopic exam:      The right eye shows red reflex.       The left eye shows red reflex.  Cardiovascular: Normal rate, regular rhythm and normal heart sounds.   Pulses:      Carotid pulses are 2+ on the right side, and 2+ on the left side.      Radial pulses are 2+ on the right side, and 2+ on the left side.       Dorsalis pedis pulses are 2+ on the right side, and 2+ on the left side.       Posterior tibial pulses are 2+ on the right side, and 2+ on the left side.  Pulmonary/Chest: Breath sounds normal.  Lymphadenopathy:    He has no cervical adenopathy.       Right: No supraclavicular adenopathy present.       Left: No supraclavicular adenopathy present.    Diabetic Foot Exam - Simple   Simple Foot Form Diabetic Foot exam was performed with the following findings:  Yes 11/12/2016 11:22 AM  Visual Inspection See comments:  Yes Sensation Testing Intact to touch and monofilament testing bilaterally:  Yes Pulse Check Posterior Tibialis and Dorsalis pulse intact bilaterally:  Yes Comments Hyperkeratotic toenails on bilateral feet.  Hyperkeratotic patch on lateral aspect of left foot    Assessment and Plan 1. Diabetes mellitus type 2 in obese (HCC) Continue to make healthy diet choices Goal cardio for the week of 150 minutes - Hemoglobin A1c - Comprehensive metabolic panel - Microalbumin, urine  2. Hyperlipidemia, unspecified hyperlipidemia type - Lipid panel  3. Essential hypertension Return to the office in 3 months for re-evaluation  4. BMI 45.0-49.9, adult ([place]) - Amb Ref to Medical Weight Management  Verdis Frederickson "Mia" Cristabel Bicknell, [facility] 11/13/16 11:02 AM

## 2016-11-13 ENCOUNTER — Telehealth: Payer: Self-pay | Admitting: Physician Assistant

## 2016-11-13 ENCOUNTER — Encounter: Payer: Self-pay | Admitting: Physician Assistant

## 2016-11-13 LAB — COMPREHENSIVE METABOLIC PANEL
ALT: 19 IU/L (ref 0–44)
AST: 22 IU/L (ref 0–40)
Albumin/Globulin Ratio: 1.4 (ref 1.2–2.2)
Albumin: 4.6 g/dL (ref 3.5–5.5)
Alkaline Phosphatase: 108 IU/L (ref 39–117)
BILIRUBIN TOTAL: 2.3 mg/dL — AB (ref 0.0–1.2)
BUN/Creatinine Ratio: 14 (ref 9–20)
BUN: 13 mg/dL (ref 6–24)
CHLORIDE: 96 mmol/L (ref 96–106)
CO2: 25 mmol/L (ref 20–29)
CREATININE: 0.94 mg/dL (ref 0.76–1.27)
Calcium: 9.8 mg/dL (ref 8.7–10.2)
GFR calc non Af Amer: 93 mL/min/{1.73_m2} (ref >59)
GFR, EST AFRICAN AMERICAN: 107 mL/min/{1.73_m2} (ref >59)
GLUCOSE: 147 mg/dL — AB (ref 65–99)
Globulin, Total: 3.2 g/dL (ref 1.5–4.5)
Potassium: 3.6 mmol/L (ref 3.5–5.2)
Sodium: 142 mmol/L (ref 134–144)
TOTAL PROTEIN: 7.8 g/dL (ref 6.0–8.5)

## 2016-11-13 LAB — LIPID PANEL
CHOL/HDL RATIO: 3.8 ratio (ref 0.0–5.0)
Cholesterol, Total: 129 mg/dL (ref 100–199)
HDL: 34 mg/dL — AB (ref >39)
LDL Calculated: 68 mg/dL (ref 0–99)
Triglycerides: 137 mg/dL (ref 0–149)
VLDL Cholesterol Cal: 27 mg/dL (ref 5–40)

## 2016-11-13 LAB — HEMOGLOBIN A1C
ESTIMATED AVERAGE GLUCOSE: 154 mg/dL
HEMOGLOBIN A1C: 7 % — AB (ref 4.8–5.6)

## 2016-11-13 LAB — MICROALBUMIN, URINE: MICROALBUM., U, RANDOM: 143.4 ug/mL

## 2016-11-13 NOTE — Telephone Encounter (Signed)
PATIENT SAW CHELLE ON TUES. (11/12/16) REGARDING HIS DIABETES. HE WAS TELLING HER THAT AT HIS OFFICE VISIT WITH DR. Loanne Drilling HE WAS TOLD HIS BLOOD PRESSURE WAS HIGH AND HIS HEART RATE WAS LOW (51). HE SAID CHELLE MENTIONED ABOUT HIM GOING TO SEE A HEART DOCTOR BUT HE DOES NOT KNOW IF WE WERE GOING TO DO THE REFERRAL FOR HIM OR IF HE NEEDED TO CALL? SHE ALSO MENTIONED A KIDNEY SPECIALIST? BEST PHONE [phone] (CELL) [place]

## 2016-11-14 NOTE — Telephone Encounter (Signed)
See note below. Please advise. There is a referral placed to weight management only on 11/12/16

## 2016-11-14 NOTE — Assessment & Plan Note (Signed)
Well controlled today. He is on  4 agents. If BP increases, or is elevated consistently at future visits, will plan to refer to cardiology and/or nephrology. Advised that weight loss may help in this regard.

## 2016-11-14 NOTE — Assessment & Plan Note (Signed)
Await labs. Adjust regimen as indicated by results. Goal LDL <70 

## 2016-11-14 NOTE — Assessment & Plan Note (Signed)
Exercise is limited due to knee pain, but weight loss will have a positive impact on DM, lipids, HTN and knee pain.

## 2016-11-14 NOTE — Assessment & Plan Note (Signed)
Continue follow-up per endocrinology.

## 2016-11-14 NOTE — Telephone Encounter (Signed)
We discussed that as he is already on 4 agents, if his BP increases, we'll need to refer him. But his BP was fine when we rechecked it, so he does not need a referral at this time.

## 2016-11-15 NOTE — Telephone Encounter (Signed)
Pt advised.

## 2017-01-07 ENCOUNTER — Ambulatory Visit: Payer: BC Managed Care – PPO | Admitting: Endocrinology

## 2017-01-12 ENCOUNTER — Other Ambulatory Visit: Payer: Self-pay | Admitting: Physician Assistant

## 2017-01-12 DIAGNOSIS — I10 Essential (primary) hypertension: Secondary | ICD-10-CM

## 2017-02-05 ENCOUNTER — Ambulatory Visit: Payer: BC Managed Care – PPO | Admitting: Endocrinology

## 2017-02-11 ENCOUNTER — Ambulatory Visit (INDEPENDENT_AMBULATORY_CARE_PROVIDER_SITE_OTHER): Payer: BC Managed Care – PPO | Admitting: Physician Assistant

## 2017-02-11 ENCOUNTER — Telehealth: Payer: Self-pay | Admitting: Physician Assistant

## 2017-02-11 ENCOUNTER — Encounter: Payer: Self-pay | Admitting: Physician Assistant

## 2017-02-11 VITALS — BP 136/84 | HR 88 | Temp 98.7°F | Resp 18 | Ht 72.0 in | Wt 325.0 lb

## 2017-02-11 DIAGNOSIS — Z23 Encounter for immunization: Secondary | ICD-10-CM | POA: Diagnosis not present

## 2017-02-11 DIAGNOSIS — E785 Hyperlipidemia, unspecified: Secondary | ICD-10-CM | POA: Diagnosis not present

## 2017-02-11 DIAGNOSIS — I10 Essential (primary) hypertension: Secondary | ICD-10-CM | POA: Diagnosis not present

## 2017-02-11 DIAGNOSIS — E113399 Type 2 diabetes mellitus with moderate nonproliferative diabetic retinopathy without macular edema, unspecified eye: Secondary | ICD-10-CM | POA: Diagnosis not present

## 2017-02-11 DIAGNOSIS — Z794 Long term (current) use of insulin: Secondary | ICD-10-CM | POA: Diagnosis not present

## 2017-02-11 DIAGNOSIS — E11319 Type 2 diabetes mellitus with unspecified diabetic retinopathy without macular edema: Secondary | ICD-10-CM

## 2017-02-11 NOTE — Patient Instructions (Addendum)
Keep up the GREAT work!!!    IF you received an x-ray today, you will receive an invoice from [facility]. Please contact [facility] at [phone] with questions or concerns regarding your invoice.   IF you received labwork today, you will receive an invoice from [facility]. Please contact [facility] at [phone] with questions or concerns regarding your invoice.   Our billing staff will not be able to assist you with questions regarding bills from these companies.  You will be contacted with the lab results as soon as they are available. The fastest way to get your results is to activate your My Chart account. Instructions are located on the last page of this paperwork. If you have not heard from us regarding the results in 2 weeks, please contact this office.     

## 2017-02-11 NOTE — Progress Notes (Signed)
Subjective:    Patient ID: Justin Houston, male    DOB: 10-Jun-1965, 51 y.o.   MRN: [id number]  HPI  Mr. Samples is a 51 year old African American male with a past medical history significant for hypertension, type 2 diabetes mellitus, and hyperlipidemia who presents today for follow-up on his hypertension, type 2 diabetes mellitus and hyperlipidemia. [name] also has a chief complaint of constipation. He reports the constipation started last week. [name] reports he usually has about 2 bowel movements per day but in the last week he may only have 1 per day. He states the consistency is a firm log. [name] ate green leafy vegetables a couple of days ago helped a little bit. He is also took Dulcolax 2 times last week, but it did not improve. He denies abdominal pain or hematochezia. [name] also denies nausea or vomiting.   [name] reports he is compliant with all of his medications and he is tolerating them well without any side effects.   [name] states he was previously checking his blood glucose levels but ran out of strips in July. She states his blood glucose levels at that time ranged from 104-160s. Mr. Bones denies headaches, dizziness or vision changes. His last optometry appointment was in August. [name] denies any changes in his urinary frequency. He states once he began the Hydralazine he started having to urinate more, but now that is normal for him. He denies any hematuria. [name] also denies changes in sensation to his hands or feet. He performs feet checks daily.   [name] is not checking he blood pressure at home. [name] denies chest pain or shortness of breath. He also denies orthopnea or PND. [name] also denies peripheral edema.   [name] reports he has 3 well-balanced meals per day. Patient states he tries to eat wheat bread, some fruit and chips at work and then he will have some chicken or fish for dinner. He states he drinks a lot of  water. He reports he is walking a lot at work [facility]. Mr. Stager does not drink alcohol. He denies current use of tobacco products. He used tobacco products when he was in high school for about 11 years. [name] also denies illicit drug use.   Wt Readings from Last 3 Encounters:  02/11/17 (!) 325 lb (147.4 kg)  11/12/16 (!) 348 lb 3.2 oz (157.9 kg)  11/06/16 (!) 358 lb 3.2 oz (162.5 kg)   Medications: Prior to Admission medications   Medication Sig Start Date End Date Taking? Authorizing Provider  amLODipine (NORVASC) 10 MG tablet Take 1 tablet (10 mg total) by mouth daily. 08/16/16  Yes Jeffery, Chelle, PA-C  atenolol (TENORMIN) 50 MG tablet Take 1 tablet (50 mg total) by mouth daily. 08/16/16  Yes Jeffery, Chelle, PA-C  atorvastatin (LIPITOR) 40 MG tablet Take 1 tablet (40 mg total) by mouth daily. 08/16/16  Yes Jeffery, Domingo Mend, PA-C  B-D INS SYR ULTRAFINE 1CC/31G 31G X 5/16" 1 ML MISC USE AS DIRECTED 10/15/16  Yes Renato Shin, MD  chlorthalidone (HYGROTON) 25 MG tablet Take 1 tablet (25 mg total) by mouth daily. 08/16/16  Yes Jeffery, Chelle, PA-C  clobetasol ointment (TEMOVATE) 8.29 % Apply 1 application topically 3 (three) times daily as needed (rash.). 03/04/15  Yes Jeffery, Chelle, PA-C  colchicine 0.6 MG tablet Take 1 tablet (0.6 mg total) by mouth 2 (two) times  daily as needed. 10/23/16  Yes Jeffery, Chelle, PA-C  dapagliflozin propanediol (FARXIGA) 5 MG TABS tablet Take 5 mg by mouth daily. 08/16/16  Yes Jeffery, Chelle, PA-C  glucose blood (CONTOUR NEXT TEST) test strip 1 each by Other route 2 (two) times daily. And lancets 2/day 10/08/16  Yes Renato Shin, MD  hydrALAZINE (APRESOLINE) 25 MG tablet TAKE ONE TABLET BY MOUTH THREE TIMES A DAY 01/13/17  Yes Jeffery, Chelle, PA-C  indomethacin (INDOCIN) 50 MG capsule Take 1 capsule (50 mg total) by mouth 3 (three) times daily with meals. 10/23/16  Yes Jeffery, Chelle, PA-C  insulin aspart (NOVOLOG) 100 UNIT/ML  injection Inject 5 Units into the skin 3 (three) times daily before meals. And syringes 3/day 11/06/16  Yes Renato Shin, MD  insulin detemir (LEVEMIR) 100 UNIT/ML injection Inject 0.2 mLs (20 Units total) into the skin at bedtime. 11/06/16  Yes Renato Shin, MD  metFORMIN (GLUCOPHAGE) 1000 MG tablet Take 1 tablet (1,000 mg total) by mouth 2 (two) times daily with a meal. 08/16/16  Yes Jeffery, Chelle, PA-C  naproxen sodium (ANAPROX) 220 MG tablet Take 440 mg by mouth 2 (two) times daily as needed (pain).   Yes [provider]  ranitidine (ZANTAC) 150 MG tablet Take 1 tablet (150 mg total) by mouth every morning. 08/16/16  Yes Harrison Mons, PA-C    Allergies:  Allergies  Allergen Reactions  . Losartan Potassium-Hctz Hives   Chronic Medical Conditions:  Patient Active Problem List   Diagnosis Date Noted  . GERD (gastroesophageal reflux disease) 08/16/2016  . Diabetic retinopathy ([place]) 12/08/2014  . Colon cancer screening 11/10/2014  . Umbilical hernia, incarcerated 09/29/2013  . Mass of head, right postauricular SQ 7x4cm 09/29/2013  . Glaucoma 05/26/2013  . Gout 08/01/2012  . BMI 45.0-49.9, adult ([place]) 08/01/2012  . Hypertension 07/20/2011  . Diabetes mellitus type 2 with retinopathy ([place]) 07/20/2011  . Hyperlipidemia 07/20/2011   Review of Systems     Objective:   Physical Exam  Constitutional: He appears well-developed and well-nourished. He is active.  BP 136/84 (BP Location: Left Arm, Patient Position: Sitting, Cuff Size: Large)   Pulse 88   Temp 98.7 F (37.1 C) (Oral)   Resp 18   Ht 6' (1.829 m)   Wt (!) 325 lb (147.4 kg)   SpO2 97%   BMI 44.4 kg/m   51 year old African American obese male sitting on exam table in no acute distress   HENT:  Head: Normocephalic and atraumatic.    Eyes: Pupils are equal, round, and reactive to light. Conjunctivae are normal.  Neck: No thyromegaly present.  Cardiovascular: Normal rate, regular rhythm and normal heart  sounds.   Pulses:      Radial pulses are 2+ on the right side, and 2+ on the left side.       Dorsalis pedis pulses are 2+ on the right side, and 2+ on the left side.       Posterior tibial pulses are 1+ on the right side, and 1+ on the left side.  Pulmonary/Chest: Effort normal and breath sounds normal.  Lymphadenopathy:    He has no cervical adenopathy.  Neurological: He is alert.      Assessment & Plan:  1. Type 2 diabetes mellitus with retinopathy, with long-term current use of insulin, macular edema presence unspecified, unspecified laterality, unspecified retinopathy severity (HCC) - Hemoglobin A1c obtained today in clinic  - CMP obtained today in clinic  - Continue Metformin 100mg  BID  - Continue  Dapagliflozin 5mg  daily - Continue Levemir 20 units at bedtime - Continue Novolog 5 units TID before meals - Encouraged patient to attempt to eat well-balanced meals that include fruits and vegetables, whole grains, lean proteins, and low sodium foods. Also encouraged patient to get at least 30 minutes of exercise 5 times per week.  - Also encouraged patient to check blood glucose levels at home.  - Return to clinic in 3 months for diabetes follow-up.  2. Hyperlipidemia, unspecified hyperlipidemia type - Lipid panel obtained today in clinic  - CMP obtained today in clinic  - Continue Atorvastatin 40mg  daily  - Encouraged patient to attempt to eat well-balanced meals that include fruits and vegetables, whole grains, lean proteins, and low sodium foods. Also encouraged patient to get at least 30 minutes of exercise 5 times per week.  - Return to clinic in 3 months for hyperlipidemia follow-up.  3. Essential hypertension - CBC with diff obtained today in clinic  - CMP obtained today in clinic - Continue Amlodipine 10mg  daily - Continue Atenolol 50mg  daily  - Continue Chlorthalidone 25mg  daily  - Encouraged patient to attempt to eat well-balanced meals that include fruits and  vegetables, whole grains, lean proteins, and low sodium foods. Also encouraged patient to get at least 30 minutes of exercise 5 times per week.  - Also encouraged patient to check blood pressure at home.  - Return to clinic in 3 months for hypertension follow-up.  4. Constipation  - Recommended patient begins Docusate or Miralax - Instructed patient to continue to drink water and eat leafy green vegetables.  5. Need for influenza vaccination - Flu Vaccine QUAD 36+ mos IM administered today in clinic  Ronnell Clinger, PA-S

## 2017-02-11 NOTE — Progress Notes (Signed)
Patient ID: Justin Houston, male    DOB: Oct 18, 1965, 51 y.o.   MRN: [id number]  PCP: Harrison Mons, PA-C  Chief Complaint  Patient presents with  . Hypertension  . Hyperlipidemia  . Diabetes  . Follow-up    Subjective:   Presents for evaluation of HTN, hyperlipidemia and diabetes. He also reports constipation  X 1 week.  He is tolerating his regular medications without difficulty.  Diabetes is now managed by Dr. Loanne Drilling. He ran out of test strips, so has not been checking his home glucose. No increased urinary urgency, frequency. No polydipsia. He has been working on weight loss, and is excited for me to see considerable loss since his last visit (23 lbs since my visit with him on 7/31, 33 lbs since his visit with Dr. Loanne Drilling on 7/25). Daily feet check.  He typically has 2 BMs each day, but has only had one daily x 1 week. Increased leafy greens helped a couple of days ago. 2 doses of Dulcolax last week were ineffective. No associated abdominal pain, nausea, rectal pain, straining, melena or hematochezia.    Review of Systems  Constitutional: Negative for activity change, appetite change, fatigue and unexpected weight change.  HENT: Negative for congestion, dental problem, ear pain, hearing loss, mouth sores, postnasal drip, rhinorrhea, sneezing, sore throat, tinnitus and trouble swallowing.   Eyes: Negative for photophobia, pain, redness and visual disturbance.  Respiratory: Negative for cough, chest tightness and shortness of breath.   Cardiovascular: Negative for chest pain, palpitations and leg swelling.  Gastrointestinal: Positive for constipation. Negative for abdominal pain, blood in stool, diarrhea, nausea and vomiting.  Endocrine: Negative for cold intolerance, heat intolerance, polydipsia, polyphagia and polyuria.  Genitourinary: Negative for dysuria, frequency, hematuria and urgency.  Musculoskeletal: Negative for arthralgias, gait problem, myalgias and neck  stiffness.  Skin: Negative for rash.  Neurological: Negative for dizziness, speech difficulty, weakness, light-headedness, numbness and headaches.  Hematological: Negative for adenopathy.  Psychiatric/Behavioral: Negative for confusion and sleep disturbance. The patient is not nervous/anxious.        Patient Active Problem List   Diagnosis Date Noted  . GERD (gastroesophageal reflux disease) 08/16/2016  . Diabetic retinopathy ([place]) 12/08/2014  . Umbilical hernia, incarcerated 09/29/2013  . Mass of head, right postauricular SQ 7x4cm 09/29/2013  . Glaucoma 05/26/2013  . Gout 08/01/2012  . BMI 45.0-49.9, adult ([place]) 08/01/2012  . Hypertension 07/20/2011  . Diabetes mellitus type 2 with retinopathy ([place]) 07/20/2011  . Hyperlipidemia 07/20/2011     Prior to Admission medications   Medication Sig Start Date End Date Taking? Authorizing Provider  amLODipine (NORVASC) 10 MG tablet Take 1 tablet (10 mg total) by mouth daily. 08/16/16  Yes Dodd Schmid, PA-C  atenolol (TENORMIN) 50 MG tablet Take 1 tablet (50 mg total) by mouth daily. 08/16/16  Yes Kiasha Bellin, PA-C  atorvastatin (LIPITOR) 40 MG tablet Take 1 tablet (40 mg total) by mouth daily. 08/16/16  Yes Grainger Mccarley, Domingo Mend, PA-C  B-D INS SYR ULTRAFINE 1CC/31G 31G X 5/16" 1 ML MISC USE AS DIRECTED 10/15/16  Yes Renato Shin, MD  chlorthalidone (HYGROTON) 25 MG tablet Take 1 tablet (25 mg total) by mouth daily. 08/16/16  Yes Carson Bogden, PA-C  clobetasol ointment (TEMOVATE) 3.01 % Apply 1 application topically 3 (three) times daily as needed (rash.). 03/04/15  Yes Rashika Bettes, PA-C  colchicine 0.6 MG tablet Take 1 tablet (0.6 mg total) by mouth 2 (two) times daily as needed. 10/23/16  Yes Harrison Mons, PA-C  dapagliflozin propanediol (FARXIGA) 5 MG TABS tablet Take 5 mg by mouth daily. 08/16/16  Yes Marine Lezotte, PA-C  glucose blood (CONTOUR NEXT TEST) test strip 1 each by Other route 2 (two) times daily. And lancets 2/day 10/08/16   Yes Renato Shin, MD  hydrALAZINE (APRESOLINE) 25 MG tablet TAKE ONE TABLET BY MOUTH THREE TIMES A DAY 01/13/17  Yes Izaia Say, PA-C  indomethacin (INDOCIN) 50 MG capsule Take 1 capsule (50 mg total) by mouth 3 (three) times daily with meals. 10/23/16  Yes Nyeisha Goodall, PA-C  insulin aspart (NOVOLOG) 100 UNIT/ML injection Inject 5 Units into the skin 3 (three) times daily before meals. And syringes 3/day 11/06/16  Yes Renato Shin, MD  insulin detemir (LEVEMIR) 100 UNIT/ML injection Inject 0.2 mLs (20 Units total) into the skin at bedtime. 11/06/16  Yes Renato Shin, MD  metFORMIN (GLUCOPHAGE) 1000 MG tablet Take 1 tablet (1,000 mg total) by mouth 2 (two) times daily with a meal. 08/16/16  Yes Makira Holleman, PA-C  naproxen sodium (ANAPROX) 220 MG tablet Take 440 mg by mouth 2 (two) times daily as needed (pain).   Yes [provider]  ranitidine (ZANTAC) 150 MG tablet Take 1 tablet (150 mg total) by mouth every morning. 08/16/16  Yes Harrison Mons, PA-C     Allergies  Allergen Reactions  . Losartan Potassium-Hctz Hives       Objective:  Physical Exam  Constitutional: He is oriented to person, place, and time. He appears well-developed and well-nourished. He is active and cooperative. No distress.  BP 136/84 (BP Location: Left Arm, Patient Position: Sitting, Cuff Size: Large)   Pulse 88   Temp 98.7 F (37.1 C) (Oral)   Resp 18   Ht 6' (1.829 m)   Wt (!) 325 lb (147.4 kg)   SpO2 97%   BMI 44.08 kg/m   HENT:  Head: Normocephalic and atraumatic.  Right Ear: Hearing normal.  Left Ear: Hearing normal.  Eyes: Conjunctivae are normal. No scleral icterus.  Neck: Normal range of motion. Neck supple. No thyromegaly present.  Cardiovascular: Normal rate, regular rhythm and normal heart sounds.  Pulses:      Radial pulses are 2+ on the right side, and 2+ on the left side.  Pulmonary/Chest: Effort normal and breath sounds normal.  Lymphadenopathy:       Head (right side):  No tonsillar, no preauricular, no posterior auricular and no occipital adenopathy present.       Head (left side): No tonsillar, no preauricular, no posterior auricular and no occipital adenopathy present.    He has no cervical adenopathy.       Right: No supraclavicular adenopathy present.       Left: No supraclavicular adenopathy present.  Neurological: He is alert and oriented to person, place, and time. No sensory deficit.  Skin: Skin is warm, dry and intact. No rash noted. No cyanosis or erythema. Nails show no clubbing.  Psychiatric: He has a normal mood and affect. His speech is normal and behavior is normal.       Wt Readings from Last 3 Encounters:  02/11/17 (!) 325 lb (147.4 kg)  11/12/16 (!) 348 lb 3.2 oz (157.9 kg)  11/06/16 (!) 358 lb 3.2 oz (162.5 kg)   BP Readings from Last 3 Encounters:  02/11/17 136/84  11/12/16 130/84  11/06/16 (!) 150/86   Lab Results  Component Value Date   HGBA1C 7.0 (H) 11/12/2016       Assessment & Plan:   Problem List  Items Addressed This Visit    Hypertension    Controlled. Continue current treatment.      Relevant Orders   CBC with Differential/Platelet (Completed)   Comprehensive metabolic panel (Completed)   Diabetes mellitus type 2 with retinopathy ([place]) - Primary    Now managed by endocrinology. Update labs.       Relevant Orders   Comprehensive metabolic panel (Completed)   Hemoglobin A1c (Completed)   Hyperlipidemia    Update labs. Has been well controlled, with LDL <70. HDL has been low.      Relevant Orders   Comprehensive metabolic panel (Completed)   Lipid panel (Completed)   Diabetic retinopathy ([place])    Continue work to control glucose. Follow-up per eye specialist.       Other Visit Diagnoses    Need for influenza vaccination       Relevant Orders   Flu Vaccine QUAD 36+ mos IM (Completed)       Return in about 3 months (around 05/14/2017) for re-evalaution of blood pressure, cholesterol,  diabetes.   Fara Chute, PA-C [facility]

## 2017-02-11 NOTE — Telephone Encounter (Signed)
Clarified with the pharmacy that they received the prescriptions I sent on 08/16/2016, as the patient states that they "don't always receive them."  The pharmacist noted that he doesn't always pick up the medications due to cost.

## 2017-02-12 LAB — COMPREHENSIVE METABOLIC PANEL
ALT: 19 IU/L (ref 0–44)
AST: 16 IU/L (ref 0–40)
Albumin/Globulin Ratio: 1.6 (ref 1.2–2.2)
Albumin: 4.4 g/dL (ref 3.5–5.5)
Alkaline Phosphatase: 141 IU/L — ABNORMAL HIGH (ref 39–117)
BUN/Creatinine Ratio: 14 (ref 9–20)
BUN: 16 mg/dL (ref 6–24)
Bilirubin Total: 1.3 mg/dL — ABNORMAL HIGH (ref 0.0–1.2)
CALCIUM: 9.8 mg/dL (ref 8.7–10.2)
CO2: 26 mmol/L (ref 20–29)
CREATININE: 1.17 mg/dL (ref 0.76–1.27)
Chloride: 95 mmol/L — ABNORMAL LOW (ref 96–106)
GFR calc Af Amer: 82 mL/min/{1.73_m2} (ref >59)
GFR, EST NON AFRICAN AMERICAN: 71 mL/min/{1.73_m2} (ref >59)
GLUCOSE: 298 mg/dL — AB (ref 65–99)
Globulin, Total: 2.7 g/dL (ref 1.5–4.5)
POTASSIUM: 4 mmol/L (ref 3.5–5.2)
Sodium: 137 mmol/L (ref 134–144)
TOTAL PROTEIN: 7.1 g/dL (ref 6.0–8.5)

## 2017-02-12 LAB — LIPID PANEL
CHOLESTEROL TOTAL: 116 mg/dL (ref 100–199)
Chol/HDL Ratio: 4 ratio (ref 0.0–5.0)
HDL: 29 mg/dL — ABNORMAL LOW (ref >39)
LDL Calculated: 49 mg/dL (ref 0–99)
TRIGLYCERIDES: 191 mg/dL — AB (ref 0–149)
VLDL Cholesterol Cal: 38 mg/dL (ref 5–40)

## 2017-02-12 LAB — CBC WITH DIFFERENTIAL/PLATELET
Basophils Absolute: 0 10*3/uL (ref 0.0–0.2)
Basos: 1 %
EOS (ABSOLUTE): 0.1 10*3/uL (ref 0.0–0.4)
EOS: 1 %
Hematocrit: 47.5 % (ref 37.5–51.0)
Hemoglobin: 16.2 g/dL (ref 13.0–17.7)
IMMATURE GRANS (ABS): 0 10*3/uL (ref 0.0–0.1)
IMMATURE GRANULOCYTES: 0 %
LYMPHS: 38 %
Lymphocytes Absolute: 2.2 10*3/uL (ref 0.7–3.1)
MCH: 30.5 pg (ref 26.6–33.0)
MCHC: 34.1 g/dL (ref 31.5–35.7)
MCV: 89 fL (ref 79–97)
MONOS ABS: 0.5 10*3/uL (ref 0.1–0.9)
Monocytes: 8 %
NEUTROS PCT: 52 %
Neutrophils Absolute: 3.2 10*3/uL (ref 1.4–7.0)
PLATELETS: 198 10*3/uL (ref 150–379)
RBC: 5.32 x10E6/uL (ref 4.14–5.80)
RDW: 14.2 % (ref 12.3–15.4)
WBC: 5.9 10*3/uL (ref 3.4–10.8)

## 2017-02-12 LAB — HEMOGLOBIN A1C
ESTIMATED AVERAGE GLUCOSE: 263 mg/dL
Hgb A1c MFr Bld: 10.8 % — ABNORMAL HIGH (ref 4.8–5.6)

## 2017-02-19 ENCOUNTER — Telehealth: Payer: Self-pay | Admitting: Physician Assistant

## 2017-02-19 NOTE — Telephone Encounter (Signed)
Copied from [place] #[id number]. Topic: Quick Communication - See Telephone Encounter >> Feb 19, 2017  5:14 PM Ivar Drape wrote: CRM for notification. See Telephone encounter for:  02/19/17.  Patient stated the medication that he was put on for bowel movements did not work.  He feels he needs something stronger.

## 2017-02-19 NOTE — Assessment & Plan Note (Signed)
Continue work to control glucose. Follow-up per eye specialist.

## 2017-02-19 NOTE — Assessment & Plan Note (Signed)
Now managed by endocrinology. Update labs.

## 2017-02-19 NOTE — Assessment & Plan Note (Signed)
Controlled. Continue current treatment. 

## 2017-02-19 NOTE — Telephone Encounter (Signed)
Please see CRM 

## 2017-02-19 NOTE — Assessment & Plan Note (Signed)
Update labs. Has been well controlled, with LDL <70. HDL has been low.

## 2017-02-20 NOTE — Telephone Encounter (Signed)
Per OV with Chelle, she recommended Miralax  Called pt - LMOVM to take Miralax - mix with water. Stressed with Miralax and any other laxative, he needed to drink at least 6-8 glasses of water daily to help them work appropriately.  Advised he can take 2x daily until bowels moving appropriately - then back off to every other or every 3rd day to keep him regular. Above all, drink water.

## 2017-02-21 ENCOUNTER — Telehealth: Payer: Self-pay | Admitting: Physician Assistant

## 2017-02-21 NOTE — Telephone Encounter (Signed)
Copied from [place] [id number]. Topic: Inquiry >> Feb 21, 2017  8:29 AM Darl Householder, RMA wrote: Reason for CRM: patient is requesting lab results, please call pt at [phone]

## 2017-02-24 NOTE — Telephone Encounter (Signed)
Justin Houston,   Patient states he is taking all his medication as prescribed.

## 2017-02-25 NOTE — Telephone Encounter (Signed)
I am concerned that his A1C has gone up by so much since his last visit.  He needs to contact Dr. Loanne Drilling to schedule a follow-up visit. I anticipate that Dr. Loanne Drilling will change the medication regimen to achieve better control.

## 2017-02-25 NOTE — Telephone Encounter (Signed)
Copied from [place] [id number]. Topic: Inquiry >> Feb 21, 2017  8:29 AM Darl Householder, RMA wrote: Reason for CRM: patient is requesting lab results, please call pt at [phone]    >> Feb 21, 2017  8:39 AM Darl Householder, RMA wrote: No CRM in chart for NT to give lab results

## 2017-02-26 ENCOUNTER — Telehealth: Payer: Self-pay

## 2017-02-26 NOTE — Telephone Encounter (Signed)
Advised pt

## 2017-02-27 ENCOUNTER — Ambulatory Visit: Payer: BC Managed Care – PPO | Admitting: Endocrinology

## 2017-02-27 ENCOUNTER — Encounter: Payer: Self-pay | Admitting: Endocrinology

## 2017-02-27 DIAGNOSIS — E669 Obesity, unspecified: Secondary | ICD-10-CM

## 2017-02-27 DIAGNOSIS — E1169 Type 2 diabetes mellitus with other specified complication: Secondary | ICD-10-CM

## 2017-02-27 MED ORDER — GLUCOSE BLOOD VI STRP: 1.0000 | ORAL_STRIP | Freq: Two times a day (BID) | 12 refills | Status: DC

## 2017-02-27 MED ORDER — INSULIN DETEMIR 100 UNIT/ML ~~LOC~~ SOLN: 60.0000 [IU] | SUBCUTANEOUS | 2 refills | Status: DC

## 2017-02-27 NOTE — Patient Instructions (Addendum)
check your blood sugar twice a day.  vary the time of day when you check, between before the 3 meals, and at bedtime.  also check if you have symptoms of your blood sugar being too high or too low.  please keep a record of the readings and bring it to your next appointment here (or you can bring the meter itself).  You can write it on any piece of paper.  please call us sooner if your blood sugar goes below 70, or if you have a lot of readings over 200.   For now, please:  Please continue the same metformin, and:  Please continue the same farxiga, and:  increase the levemir to 60 units each morning, and: Stop taking the novolog.  Please call or message [place] next week, to tell us how the blood sugar is doing.   Please come back for a follow-up appointment in 2 months.

## 2017-02-27 NOTE — Progress Notes (Signed)
Subjective:    Patient ID: Justin Houston, male    DOB: Jan 22, 1966, 51 y.o.   MRN: [id number]  HPI Pt returns for f/u of diabetes mellitus: DM type: Insulin-requiring type 2.   Dx'ed: 4098 Complications: NPDR Therapy: insulin since soon after dx DKA: never Severe hypoglycemia: never Pancreatitis: never Pancreatic imaging: never  Other: he takes qd insulin, at least for now Interval history: Pt says he takes novolog only.  pt states he feels well in general.  He has not recently checked cbg's. Past Medical History:  Diagnosis Date  . Dermatitis   . Diabetes mellitus   . GERD (gastroesophageal reflux disease)   . Hyperlipidemia   . Hypertension   . Lipoma of scalp   . Obesity     Past Surgical History:  Procedure Laterality Date  . COLONOSCOPY WITH PROPOFOL N/A 12/01/2014   Performed by [name], MD at [facility]  . right arm ORIF      Social History   Socioeconomic History  . Marital status: Single    Spouse name: n/a  . Number of children: 0  . Years of education: 62  . Highest education level: Not on file  Social Needs  . Financial resource strain: Not on file  . Food insecurity - worry: Not on file  . Food insecurity - inability: Not on file  . Transportation needs - medical: Not on file  . Transportation needs - non-medical: Not on file  Occupational History  . Occupation: Software engineer: [employer]  Tobacco Use  . Smoking status: Former Smoker    Last attempt to quit: 09/05/1994    Years since quitting: 22.5  . Smokeless tobacco: Never Used  Substance and Sexual Activity  . Alcohol use: No    Alcohol/week: 0.0 oz  . Drug use: No  . Sexual activity: No  Other Topics Concern  . Not on file  Social History Narrative   Master's Degree in Adult Education.  Lives alone.   No siblings.    Current Outpatient Medications on File Prior to Visit  Medication Sig Dispense Refill  . amLODipine (NORVASC) 10 MG  tablet Take 1 tablet (10 mg total) by mouth daily. 90 tablet 3  . atenolol (TENORMIN) 50 MG tablet Take 1 tablet (50 mg total) by mouth daily. 90 tablet 3  . atorvastatin (LIPITOR) 40 MG tablet Take 1 tablet (40 mg total) by mouth daily. 90 tablet 3  . B-D INS SYR ULTRAFINE 1CC/31G 31G X 5/16" 1 ML MISC USE AS DIRECTED 100 each 0  . chlorthalidone (HYGROTON) 25 MG tablet Take 1 tablet (25 mg total) by mouth daily. 90 tablet 3  . clobetasol ointment (TEMOVATE) 1.19 % Apply 1 application topically 3 (three) times daily as needed (rash.). 60 g 1  . colchicine 0.6 MG tablet Take 1 tablet (0.6 mg total) by mouth 2 (two) times daily as needed. 10 tablet 0  . dapagliflozin propanediol (FARXIGA) 5 MG TABS tablet Take 5 mg by mouth daily. 90 tablet 3  . hydrALAZINE (APRESOLINE) 25 MG tablet TAKE ONE TABLET BY MOUTH THREE TIMES A DAY 90 tablet 0  . indomethacin (INDOCIN) 50 MG capsule Take 1 capsule (50 mg total) by mouth 3 (three) times daily with meals. 30 capsule 0  . metFORMIN (GLUCOPHAGE) 1000 MG tablet Take 1 tablet (1,000 mg total) by mouth 2 (two) times daily with a meal. 180 tablet 3  . naproxen sodium (ANAPROX) 220  MG tablet Take 440 mg by mouth 2 (two) times daily as needed (pain).    . ranitidine (ZANTAC) 150 MG tablet Take 1 tablet (150 mg total) by mouth every morning. 90 tablet 3   No current facility-administered medications on file prior to visit.     Allergies  Allergen Reactions  . Losartan Potassium-Hctz Hives    Family History  Problem Relation Age of Onset  . Stroke Mother   . Hypertension Mother   . Diabetes Maternal Grandmother   . Prostate cancer Maternal Grandfather   . Diabetes Other     BP (!) 154/92 (BP Location: Left Arm, Patient Position: Sitting, Cuff Size: Normal)   Pulse 70   Wt (!) 326 lb 6.4 oz (148.1 kg)   SpO2 97%   BMI 44.27 kg/m   Review of Systems He denies hypoglycemia.  He has lost weight.     Objective:   Physical Exam VITAL SIGNS:  See vs  page GENERAL: no distress Pulses: foot pulses are intact bilaterally.   MSK: no deformity of the feet or ankles.  CV: 1+ bilat edema of the legs, and large bilat vv's of the legs.  Skin:  no ulcer on the feet or ankles.  normal temp on the feet and ankles.  There is patchy hyperpigmentation of the feet Neuro: sensation is intact to touch on the feet and ankles.   Ext: There is bilateral onychomycosis of the toenails.   Lab Results  Component Value Date   HGBA1C 10.8 (H) 02/11/2017        Assessment & Plan:  Insulin-requiring type 2 DM, with DR: Much worse.  Noncompliance with cbg recording: we discussed need to check cbg's.  Weight loss, due to severe hyperglycemia.    Patient Instructions  check your blood sugar twice a day.  vary the time of day when you check, between before the 3 meals, and at bedtime.  also check if you have symptoms of your blood sugar being too high or too low.  please keep a record of the readings and bring it to your next appointment here (or you can bring the meter itself).  You can write it on any piece of paper.  please call us sooner if your blood sugar goes below 70, or if you have a lot of readings over 200.   For now, please:  Please continue the same metformin, and:  Please continue the same farxiga, and:  increase the levemir to 60 units each morning, and: Stop taking the novolog.  Please call or message [place] next week, to tell us how the blood sugar is doing.   Please come back for a follow-up appointment in 2 months.

## 2017-03-15 ENCOUNTER — Other Ambulatory Visit: Payer: Self-pay | Admitting: Physician Assistant

## 2017-03-15 DIAGNOSIS — I10 Essential (primary) hypertension: Secondary | ICD-10-CM

## 2017-03-18 LAB — HM DIABETES EYE EXAM

## 2017-04-02 NOTE — Telephone Encounter (Signed)
Error-Sign Encounter 

## 2017-04-29 ENCOUNTER — Ambulatory Visit: Payer: BC Managed Care – PPO | Admitting: Endocrinology

## 2017-05-15 ENCOUNTER — Other Ambulatory Visit: Payer: Self-pay | Admitting: Physician Assistant

## 2017-05-15 DIAGNOSIS — I10 Essential (primary) hypertension: Secondary | ICD-10-CM

## 2017-05-16 ENCOUNTER — Ambulatory Visit: Payer: BC Managed Care – PPO | Admitting: Physician Assistant

## 2017-05-26 ENCOUNTER — Encounter: Payer: Self-pay | Admitting: Endocrinology

## 2017-05-26 ENCOUNTER — Ambulatory Visit (INDEPENDENT_AMBULATORY_CARE_PROVIDER_SITE_OTHER): Payer: BC Managed Care – PPO | Admitting: Endocrinology

## 2017-05-26 VITALS — BP 162/88 | HR 70 | Wt 330.8 lb

## 2017-05-26 DIAGNOSIS — E11319 Type 2 diabetes mellitus with unspecified diabetic retinopathy without macular edema: Secondary | ICD-10-CM

## 2017-05-26 DIAGNOSIS — Z794 Long term (current) use of insulin: Secondary | ICD-10-CM

## 2017-05-26 LAB — POCT GLYCOSYLATED HEMOGLOBIN (HGB A1C): HEMOGLOBIN A1C: 7.8

## 2017-05-26 NOTE — Patient Instructions (Addendum)
Your blood pressure is high today.  Please see your primary care provider soon, to have it rechecked. check your blood sugar twice a day.  vary the time of day when you check, between before the 3 meals, and at bedtime.  also check if you have symptoms of your blood sugar being too high or too low.  please keep a record of the readings and bring it to your next appointment here (or you can bring the meter itself).  You can write it on any piece of paper.  please call us sooner if your blood sugar goes below 70, or if you have a lot of readings over 200.   Please continue the same medications. Please call your insurance, to see if there is an alternative to levemir, then let us know what it is.  We'll change if we can Please come back for a follow-up appointment in 3 months.

## 2017-05-26 NOTE — Progress Notes (Signed)
Subjective:    Patient ID: Justin Houston, male    DOB: 1965/07/31, 52 y.o.   MRN: [id number]  HPI Pt returns for f/u of diabetes mellitus: DM type: Insulin-requiring type 2.   Dx'ed: 0932 Complications: NPDR Therapy: insulin since soon after dx DKA: never Severe hypoglycemia: never Pancreatitis: never Pancreatic imaging: never  Other: he is not a candidate for multiple daily injections, due to noncompliance.  Interval history: He does not check cbg's.  pt states he feels well in general.  He says the levemir is too expensive.   Past Medical History:  Diagnosis Date  . Dermatitis   . Diabetes mellitus   . GERD (gastroesophageal reflux disease)   . Hyperlipidemia   . Hypertension   . Lipoma of scalp   . Obesity     Past Surgical History:  Procedure Laterality Date  . COLONOSCOPY WITH PROPOFOL N/A 12/01/2014   Procedure: COLONOSCOPY WITH PROPOFOL;  Surgeon: [name], MD;  Location: [facility];  Service: Endoscopy;  Laterality: N/A;  . right arm ORIF      Social History   Socioeconomic History  . Marital status: Single    Spouse name: n/a  . Number of children: 0  . Years of education: 39  . Highest education level: Not on file  Social Needs  . Financial resource strain: Not on file  . Food insecurity - worry: Not on file  . Food insecurity - inability: Not on file  . Transportation needs - medical: Not on file  . Transportation needs - non-medical: Not on file  Occupational History  . Occupation: Software engineer: [employer]  Tobacco Use  . Smoking status: Former Smoker    Last attempt to quit: 09/05/1994    Years since quitting: 22.7  . Smokeless tobacco: Never Used  Substance and Sexual Activity  . Alcohol use: No    Alcohol/week: 0.0 oz  . Drug use: No  . Sexual activity: No  Other Topics Concern  . Not on file  Social History Narrative   Master's Degree in Adult Education.  Lives alone.   No siblings.     Current Outpatient Medications on File Prior to Visit  Medication Sig Dispense Refill  . amLODipine (NORVASC) 10 MG tablet Take 1 tablet (10 mg total) by mouth daily. 90 tablet 3  . atenolol (TENORMIN) 50 MG tablet Take 1 tablet (50 mg total) by mouth daily. 90 tablet 3  . atorvastatin (LIPITOR) 40 MG tablet Take 1 tablet (40 mg total) by mouth daily. 90 tablet 3  . B-D INS SYR ULTRAFINE 1CC/31G 31G X 5/16" 1 ML MISC USE AS DIRECTED 100 each 0  . chlorthalidone (HYGROTON) 25 MG tablet Take 1 tablet (25 mg total) by mouth daily. 90 tablet 3  . clobetasol ointment (TEMOVATE) 3.55 % Apply 1 application topically 3 (three) times daily as needed (rash.). 60 g 1  . colchicine 0.6 MG tablet Take 1 tablet (0.6 mg total) by mouth 2 (two) times daily as needed. 10 tablet 0  . dapagliflozin propanediol (FARXIGA) 5 MG TABS tablet Take 5 mg by mouth daily. 90 tablet 3  . glucose blood (CONTOUR NEXT TEST) test strip 1 each 2 (two) times daily by Other route. And lancets 2/day 100 each 12  . hydrALAZINE (APRESOLINE) 25 MG tablet TAKE ONE TABLET BY MOUTH THREE TIMES A DAY 90 tablet 0  . indomethacin (INDOCIN) 50 MG capsule Take 1 capsule (50 mg total)  by mouth 3 (three) times daily with meals. 30 capsule 0  . insulin detemir (LEVEMIR) 100 UNIT/ML injection Inject 0.6 mLs (60 Units total) every morning into the skin. 20 mL 2  . metFORMIN (GLUCOPHAGE) 1000 MG tablet Take 1 tablet (1,000 mg total) by mouth 2 (two) times daily with a meal. 180 tablet 3  . naproxen sodium (ANAPROX) 220 MG tablet Take 440 mg by mouth 2 (two) times daily as needed (pain).    . ranitidine (ZANTAC) 150 MG tablet Take 1 tablet (150 mg total) by mouth every morning. 90 tablet 3   No current facility-administered medications on file prior to visit.     Allergies  Allergen Reactions  . Losartan Potassium-Hctz Hives    Family History  Problem Relation Age of Onset  . Stroke Mother   . Hypertension Mother   . Diabetes Maternal  Grandmother   . Prostate cancer Maternal Grandfather   . Diabetes Other     BP (!) 162/88 (BP Location: Left Arm, Patient Position: Sitting, Cuff Size: Normal)   Pulse 70   Wt (!) 330 lb 12.8 oz (150 kg)   SpO2 98%   BMI 44.86 kg/m   Review of Systems He denies hypoglycemia sxs.      Objective:   Physical Exam VITAL SIGNS:  See vs page GENERAL: no distress Pulses: foot pulses are intact bilaterally.   MSK: no deformity of the feet or ankles.  CV: trace bilat edema of the legs, and large bilat vv's of the legs.  Skin:  no ulcer on the feet or ankles.  normal temp on the feet and ankles.  There is patchy hyperpigmentation of the feet Neuro: sensation is intact to touch on the feet and ankles.   Ext: There is bilateral onychomycosis of the toenails.   Lab Results  Component Value Date   HGBA1C 7.8 05/26/2017      Assessment & Plan:  Insulin-requiring type 2 DM: this is the best control this pt should aim for, given this regimen, which does match insulin to his changing needs throughout the day.    Patient Instructions  Your blood pressure is high today.  Please see your primary care provider soon, to have it rechecked. check your blood sugar twice a day.  vary the time of day when you check, between before the 3 meals, and at bedtime.  also check if you have symptoms of your blood sugar being too high or too low.  please keep a record of the readings and bring it to your next appointment here (or you can bring the meter itself).  You can write it on any piece of paper.  please call us sooner if your blood sugar goes below 70, or if you have a lot of readings over 200.   Please continue the same medications. Please call your insurance, to see if there is an alternative to levemir, then let us know what it is.  We'll change if we can Please come back for a follow-up appointment in 3 months.

## 2017-06-13 ENCOUNTER — Ambulatory Visit: Payer: BC Managed Care – PPO | Admitting: Physician Assistant

## 2017-06-20 ENCOUNTER — Other Ambulatory Visit: Payer: Self-pay | Admitting: Endocrinology

## 2017-06-20 DIAGNOSIS — E1169 Type 2 diabetes mellitus with other specified complication: Secondary | ICD-10-CM

## 2017-06-20 DIAGNOSIS — E669 Obesity, unspecified: Principal | ICD-10-CM

## 2017-07-12 ENCOUNTER — Ambulatory Visit: Payer: BC Managed Care – PPO | Admitting: Physician Assistant

## 2017-07-12 ENCOUNTER — Encounter: Payer: Self-pay | Admitting: Physician Assistant

## 2017-07-12 VITALS — BP 140/80 | HR 71 | Temp 97.7°F | Ht 71.0 in | Wt 334.0 lb

## 2017-07-12 DIAGNOSIS — E1169 Type 2 diabetes mellitus with other specified complication: Secondary | ICD-10-CM | POA: Diagnosis not present

## 2017-07-12 DIAGNOSIS — Z125 Encounter for screening for malignant neoplasm of prostate: Secondary | ICD-10-CM | POA: Diagnosis not present

## 2017-07-12 DIAGNOSIS — L84 Corns and callosities: Secondary | ICD-10-CM

## 2017-07-12 DIAGNOSIS — L309 Dermatitis, unspecified: Secondary | ICD-10-CM

## 2017-07-12 DIAGNOSIS — E11319 Type 2 diabetes mellitus with unspecified diabetic retinopathy without macular edema: Secondary | ICD-10-CM | POA: Diagnosis not present

## 2017-07-12 DIAGNOSIS — Z6841 Body Mass Index (BMI) 40.0 and over, adult: Secondary | ICD-10-CM | POA: Diagnosis not present

## 2017-07-12 DIAGNOSIS — E669 Obesity, unspecified: Secondary | ICD-10-CM

## 2017-07-12 DIAGNOSIS — I10 Essential (primary) hypertension: Secondary | ICD-10-CM

## 2017-07-12 DIAGNOSIS — Z794 Long term (current) use of insulin: Secondary | ICD-10-CM | POA: Diagnosis not present

## 2017-07-12 DIAGNOSIS — M1A4710 Other secondary chronic gout, right ankle and foot, without tophus (tophi): Secondary | ICD-10-CM

## 2017-07-12 DIAGNOSIS — E785 Hyperlipidemia, unspecified: Secondary | ICD-10-CM

## 2017-07-12 DIAGNOSIS — K219 Gastro-esophageal reflux disease without esophagitis: Secondary | ICD-10-CM

## 2017-07-12 DIAGNOSIS — E113399 Type 2 diabetes mellitus with moderate nonproliferative diabetic retinopathy without macular edema, unspecified eye: Secondary | ICD-10-CM | POA: Diagnosis not present

## 2017-07-12 MED ORDER — HYDRALAZINE HCL 25 MG PO TABS: 25.0000 mg | ORAL_TABLET | Freq: Three times a day (TID) | ORAL | 1 refills | Status: DC

## 2017-07-12 MED ORDER — CHLORTHALIDONE 25 MG PO TABS
25.0000 mg | ORAL_TABLET | Freq: Every day | ORAL | 3 refills | Status: DC
Start: 2017-07-12 — End: 2018-06-10

## 2017-07-12 MED ORDER — METFORMIN HCL 1000 MG PO TABS: 1000.0000 mg | ORAL_TABLET | Freq: Two times a day (BID) | ORAL | 3 refills | Status: DC

## 2017-07-12 MED ORDER — DAPAGLIFLOZIN PROPANEDIOL 5 MG PO TABS: 5.0000 mg | ORAL_TABLET | Freq: Every day | ORAL | 3 refills | Status: DC

## 2017-07-12 MED ORDER — GLUCOSE BLOOD VI STRP: 1.0000 | ORAL_STRIP | Freq: Two times a day (BID) | 12 refills | Status: DC

## 2017-07-12 MED ORDER — NAPROXEN SODIUM 220 MG PO TABS: 440.0000 mg | ORAL_TABLET | Freq: Two times a day (BID) | ORAL | 0 refills | Status: DC | PRN

## 2017-07-12 MED ORDER — COLCHICINE 0.6 MG PO TABS: 0.6000 mg | ORAL_TABLET | Freq: Two times a day (BID) | ORAL | 0 refills | Status: DC | PRN

## 2017-07-12 MED ORDER — CLOBETASOL PROPIONATE 0.05 % EX OINT: 1.0000 "application " | TOPICAL_OINTMENT | Freq: Three times a day (TID) | CUTANEOUS | 1 refills | Status: DC | PRN

## 2017-07-12 MED ORDER — ATENOLOL 50 MG PO TABS: 50.0000 mg | ORAL_TABLET | Freq: Every day | ORAL | 3 refills | Status: DC

## 2017-07-12 MED ORDER — AMLODIPINE BESYLATE 10 MG PO TABS: 10.0000 mg | ORAL_TABLET | Freq: Every day | ORAL | 3 refills | Status: DC

## 2017-07-12 MED ORDER — ATORVASTATIN CALCIUM 40 MG PO TABS: 40.0000 mg | ORAL_TABLET | Freq: Every day | ORAL | 3 refills | Status: DC

## 2017-07-12 MED ORDER — RANITIDINE HCL 150 MG PO TABS: 150.0000 mg | ORAL_TABLET | Freq: Every morning | ORAL | 3 refills | Status: DC

## 2017-07-12 MED ORDER — INSULIN DETEMIR 100 UNIT/ML ~~LOC~~ SOLN: SUBCUTANEOUS | 2 refills | Status: DC

## 2017-07-12 NOTE — Assessment & Plan Note (Signed)
Control is currently considered best possible for this patient. COntinue management with endocrinology.

## 2017-07-12 NOTE — Progress Notes (Signed)
Patient ID: Justin Houston, male    DOB: [birth year], 52 y.o.   MRN: [id number]  PCP: Harrison Mons, PA-C  Chief Complaint  Patient presents with  . Follow-up    DM, BP  . Hypertension  . Diabetes  . Medication Refill    pt states needs refills on all meds    Subjective:   Presents for evaluation of diabetes, HTN, lipids, obesity.  Seeing endocrinology now for diabetes management. Last visit 2/11. A1C was 7.8%, thought likely the best control to be expected for this patient. BP was elevated, and he was encouraged to follow-up with me.  I referred [facility] to [facility] 10[facility]. He did not call back to schedule, but is now interested. At his last visit with me, 01/2017, he had been working on weight loss. He had lost 23 lbs since his visit in 10/2016. Current exercise consists [place] 1 mile), taking about 35-40 minutes, usually twice a week. Plans to add water aerobics. Current eating: Breakfast [place] bacon/sausage, eggs or cereal; Lunch [place] sandwich, chips and water; supper leftovers-chicken and greens; snacks sometimes chips or yogurt  He is  tolerating his medications well.  Has scheduled an appointment with general surgery to have the large lipoma in the RIGHT scalp excised. The co-pay is difficult, he's having to make payments.   Review of Systems Denies chest pain, shortness of breath, HA, dizziness, vision change, nausea, vomiting, diarrhea, constipation, melena, hematochezia, dysuria, increased urinary urgency or frequency, increased hunger or thirst, unintentional weight change, unexplained myalgias or arthralgias, rash.   Patient Active Problem List   Diagnosis Date Noted  . GERD (gastroesophageal reflux disease) 08/16/2016  . Diabetic retinopathy ([place]) 12/08/2014  . Umbilical hernia, incarcerated 09/29/2013  . Mass of head, right postauricular SQ 7x4cm 09/29/2013  . Glaucoma 05/26/2013  . Gout 08/01/2012  . BMI 45.0-49.9, adult  ([place]) 08/01/2012  . Hypertension 07/20/2011  . Diabetes mellitus type 2 with retinopathy ([place]) 07/20/2011  . Hyperlipidemia 07/20/2011     Prior to Admission medications   Medication Sig Start Date End Date Taking? Authorizing Provider  amLODipine (NORVASC) 10 MG tablet Take 1 tablet (10 mg total) by mouth daily. 08/16/16   Harrison Mons, PA-C  atenolol (TENORMIN) 50 MG tablet Take 1 tablet (50 mg total) by mouth daily. 08/16/16   Harrison Mons, PA-C  atorvastatin (LIPITOR) 40 MG tablet Take 1 tablet (40 mg total) by mouth daily. 08/16/16   Harrison Mons, PA-C  B-D INS SYR ULTRAFINE 1CC/31G 31G X 5/16" 1 ML MISC USE AS DIRECTED 10/15/16   Renato Shin, MD  chlorthalidone (HYGROTON) 25 MG tablet Take 1 tablet (25 mg total) by mouth daily. 08/16/16   Harrison Mons, PA-C  clobetasol ointment (TEMOVATE) 8.34 % Apply 1 application topically 3 (three) times daily as needed (rash.). 03/04/15   Harrison Mons, PA-C  colchicine 0.6 MG tablet Take 1 tablet (0.6 mg total) by mouth 2 (two) times daily as needed. 10/23/16   Harrison Mons, PA-C  dapagliflozin propanediol (FARXIGA) 5 MG TABS tablet Take 5 mg by mouth daily. 08/16/16   Markeia Harkless, PA-C  glucose blood (CONTOUR NEXT TEST) test strip 1 each 2 (two) times daily by Other route. And lancets 2/day 02/27/17   Renato Shin, MD  hydrALAZINE (APRESOLINE) 25 MG tablet TAKE ONE TABLET BY MOUTH THREE TIMES A DAY 05/16/17   Anyia Gierke, PA-C  indomethacin (INDOCIN) 50 MG capsule Take 1 capsule (50 mg total)  by mouth 3 (three) times daily with meals. 10/23/16   Harrison Mons, PA-C  LEVEMIR 100 UNIT/ML injection INJECT 0.6ML (60 UNITS TOTAL) EVERY MORNING INTO THE SKIN 06/20/17   Renato Shin, MD  metFORMIN (GLUCOPHAGE) 1000 MG tablet Take 1 tablet (1,000 mg total) by mouth 2 (two) times daily with a meal. 08/16/16   Pragya Lofaso, PA-C  naproxen sodium (ANAPROX) 220 MG tablet Take 440 mg by mouth 2 (two) times daily as needed (pain).    [provider]  ranitidine (ZANTAC) 150 MG tablet Take 1 tablet (150 mg total) by mouth every morning. 08/16/16   Harrison Mons, PA-C     Allergies  Allergen Reactions  . Losartan Potassium-Hctz Hives       Objective:  Physical Exam  Constitutional: He is oriented to person, place, and time. He appears well-developed and well-nourished. He is active and cooperative. No distress.  BP (!) 150/80 (BP Location: Left Arm, Patient Position: Sitting, Cuff Size: Large)   Pulse 71   Temp 97.7 F (36.5 C) (Oral)   Ht 5\' 11"  (1.803 m)   Wt (!) 334 lb (151.5 kg)   SpO2 98%   BMI 46.58 kg/m   HENT:  Head: Normocephalic and atraumatic.  Right Ear: Hearing normal.  Left Ear: Hearing normal.  Eyes: Conjunctivae are normal. No scleral icterus.  Neck: Normal range of motion. Neck supple. No thyromegaly present.  Cardiovascular: Normal rate, regular rhythm and normal heart sounds.  Pulses:      Radial pulses are 2+ on the right side, and 2+ on the left side.  Pulmonary/Chest: Effort normal and breath sounds normal.  Lymphadenopathy:       Head (right side): No tonsillar, no preauricular, no posterior auricular and no occipital adenopathy present.       Head (left side): No tonsillar, no preauricular, no posterior auricular and no occipital adenopathy present.    He has no cervical adenopathy.       Right: No supraclavicular adenopathy present.       Left: No supraclavicular adenopathy present.  Neurological: He is alert and oriented to person, place, and time. No sensory deficit.  Skin: Skin is warm, dry and intact. No rash noted. No cyanosis or erythema. Nails show no clubbing.  LArge soft, mobile mass of the RIGHT posterior scalp, consistent with lipoma. Large, persistent callous on the lateral aspect of the LEFT foot. Repeated paring with 10 blade, which he tolerated well.  Psychiatric: He has a normal mood and affect. His speech is normal and behavior is normal.    Repeat BP today:  140/80  BP Readings from Last 6 Encounters:  07/12/17 (!) 150/80  05/26/17 (!) 162/88  02/27/17 (!) 154/92   02/11/17 136/84  11/12/16 130/84  11/06/16 (!) 150/86     Wt Readings from Last 6 Encounters:  07/12/17 (!) 334 lb (151.5 kg)  05/26/17 (!) 330 lb 12.8 oz (150 kg)  02/27/17 (!) 326 lb 6.4 oz (148.1 kg)   02/11/17 (!) 325 lb (147.4 kg)  11/12/16 (!) 348 lb 3.2 oz (157.9 kg)  11/06/16 (!) 358 lb 3.2 oz (162.5 kg)      Assessment & Plan:   Problem List Items Addressed This Visit    Gout (Chronic)    Continue current regimen. Weight loss will likely help reduce flares.      Relevant Medications   colchicine 0.6 MG tablet   naproxen sodium (ALEVE) 220 MG tablet   Other Relevant Orders  Amb Ref to Medical Weight Management   Hypertension    Borderline control. Continue current regimen. Weight loss will help. Currently on 4 agents, 3 at maximum doses. Could increase BB, but would prefer to see weight loss.      Relevant Medications   hydrALAZINE (APRESOLINE) 25 MG tablet   amLODipine (NORVASC) 10 MG tablet   chlorthalidone (HYGROTON) 25 MG tablet   atorvastatin (LIPITOR) 40 MG tablet   atenolol (TENORMIN) 50 MG tablet   Other Relevant Orders   CBC with Differential/Platelet   Comprehensive metabolic panel   TSH   Urinalysis, dipstick only   Amb Ref to Medical Weight Management   Diabetes mellitus type 2 with retinopathy ([place]) - Primary    Control is currently considered best possible for this patient. COntinue management with endocrinology.      Relevant Medications   insulin detemir (LEVEMIR) 100 UNIT/ML injection   glucose blood (CONTOUR NEXT TEST) test strip   metFORMIN (GLUCOPHAGE) 1000 MG tablet   atorvastatin (LIPITOR) 40 MG tablet   dapagliflozin propanediol (FARXIGA) 5 MG TABS tablet   Other Relevant Orders   Comprehensive metabolic panel   Microalbumin / creatinine urine ratio   Hyperlipidemia    Await labs. Adjust regimen as indicated  by results. Goal LDL <70. Increase atorvastatin to 80 mg if needed. Weight loss will help.      Relevant Medications   hydrALAZINE (APRESOLINE) 25 MG tablet   amLODipine (NORVASC) 10 MG tablet   chlorthalidone (HYGROTON) 25 MG tablet   atorvastatin (LIPITOR) 40 MG tablet   atenolol (TENORMIN) 50 MG tablet   Other Relevant Orders   Comprehensive metabolic panel   Lipid panel   Amb Ref to Medical Weight Management   BMI 45.0-49.9, adult ([facility])    9 lb gain in 6 months, remains overall down from maximum weight of 394. Refer to [facility].      Relevant Medications   insulin detemir (LEVEMIR) 100 UNIT/ML injection   metFORMIN (GLUCOPHAGE) 1000 MG tablet   dapagliflozin propanediol (FARXIGA) 5 MG TABS tablet   Other Relevant Orders   Amb Ref to Medical Weight Management   Diabetic retinopathy ([place])    Continue management of diabetes, follow-up per eye specialist.      Relevant Medications   insulin detemir (LEVEMIR) 100 UNIT/ML injection   metFORMIN (GLUCOPHAGE) 1000 MG tablet   atorvastatin (LIPITOR) 40 MG tablet   dapagliflozin propanediol (FARXIGA) 5 MG TABS tablet   GERD (gastroesophageal reflux disease)    COntrolled. COntinue current treatment. Weight loss will likely help.      Relevant Medications   ranitidine (ZANTAC) 150 MG tablet   Other Relevant Orders   Amb Ref to Medical Weight Management    Other Visit Diagnoses    Screening for prostate cancer       Relevant Orders   PSA   Diabetes mellitus type 2 in obese (HCC)       Relevant Medications   insulin detemir (LEVEMIR) 100 UNIT/ML injection   glucose blood (CONTOUR NEXT TEST) test strip   metFORMIN (GLUCOPHAGE) 1000 MG tablet   atorvastatin (LIPITOR) 40 MG tablet   dapagliflozin propanediol (FARXIGA) 5 MG TABS tablet   Other Relevant Orders   Amb Ref to Medical Weight Management   Eczema       Relevant Medications   clobetasol ointment (TEMOVATE) 0.05 %   Pre-ulcerative corn or callous       Relevant  Orders   Ambulatory referral to Podiatry  Return in about 3 months (around 10/12/2017) for re-evaluation of diabetes, blood pressure, cholesterol, gout.   Fara Chute, PA-C [facility]

## 2017-07-12 NOTE — Assessment & Plan Note (Signed)
Continue current regimen. Weight loss will likely help reduce flares.

## 2017-07-12 NOTE — Patient Instructions (Signed)
     IF you received an x-ray today, you will receive an invoice from [facility]. Please contact [facility] at [phone] with questions or concerns regarding your invoice.   IF you received labwork today, you will receive an invoice from [facility]. Please contact [facility] at [phone] with questions or concerns regarding your invoice.   Our billing staff will not be able to assist you with questions regarding bills from these companies.  You will be contacted with the lab results as soon as they are available. The fastest way to get your results is to activate your My Chart account. Instructions are located on the last page of this paperwork. If you have not heard from us regarding the results in 2 weeks, please contact this office.     

## 2017-07-12 NOTE — Assessment & Plan Note (Signed)
9 lb gain in 6 months, remains overall down from maximum weight of 394. Refer to [facility].

## 2017-07-12 NOTE — Assessment & Plan Note (Signed)
Borderline control. Continue current regimen. Weight loss will help. Currently on 4 agents, 3 at maximum doses. Could increase BB, but would prefer to see weight loss.

## 2017-07-12 NOTE — Assessment & Plan Note (Signed)
Continue management of diabetes, follow-up per eye specialist.

## 2017-07-12 NOTE — Assessment & Plan Note (Signed)
COntrolled. COntinue current treatment. Weight loss will likely help.

## 2017-07-12 NOTE — Assessment & Plan Note (Signed)
Await labs. Adjust regimen as indicated by results. Goal LDL <70. Increase atorvastatin to 80 mg if needed. Weight loss will help.

## 2017-07-13 LAB — COMPREHENSIVE METABOLIC PANEL WITH GFR
ALT: 20 IU/L (ref 0–44)
AST: 16 IU/L (ref 0–40)
Albumin/Globulin Ratio: 1.3 (ref 1.2–2.2)
Albumin: 3.8 g/dL (ref 3.5–5.5)
Alkaline Phosphatase: 111 IU/L (ref 39–117)
BUN/Creatinine Ratio: 13 (ref 9–20)
BUN: 13 mg/dL (ref 6–24)
Bilirubin Total: 1 mg/dL (ref 0.0–1.2)
CO2: 24 mmol/L (ref 20–29)
Calcium: 8.9 mg/dL (ref 8.7–10.2)
Chloride: 100 mmol/L (ref 96–106)
Creatinine, Ser: 0.98 mg/dL (ref 0.76–1.27)
GFR calc Af Amer: 101 mL/min/1.73
GFR calc non Af Amer: 88 mL/min/1.73
Globulin, Total: 3 g/dL (ref 1.5–4.5)
Glucose: 220 mg/dL — ABNORMAL HIGH (ref 65–99)
Potassium: 3.9 mmol/L (ref 3.5–5.2)
Sodium: 138 mmol/L (ref 134–144)
Total Protein: 6.8 g/dL (ref 6.0–8.5)

## 2017-07-13 LAB — CBC WITH DIFFERENTIAL/PLATELET
Basophils Absolute: 0 x10E3/uL (ref 0.0–0.2)
Basos: 0 %
EOS (ABSOLUTE): 0.1 x10E3/uL (ref 0.0–0.4)
Eos: 1 %
Hematocrit: 45.9 % (ref 37.5–51.0)
Hemoglobin: 15.4 g/dL (ref 13.0–17.7)
Immature Grans (Abs): 0 x10E3/uL (ref 0.0–0.1)
Immature Granulocytes: 0 %
Lymphocytes Absolute: 1.9 x10E3/uL (ref 0.7–3.1)
Lymphs: 37 %
MCH: 30.6 pg (ref 26.6–33.0)
MCHC: 33.6 g/dL (ref 31.5–35.7)
MCV: 91 fL (ref 79–97)
Monocytes Absolute: 0.4 x10E3/uL (ref 0.1–0.9)
Monocytes: 9 %
Neutrophils Absolute: 2.7 x10E3/uL (ref 1.4–7.0)
Neutrophils: 53 %
Platelets: 179 x10E3/uL (ref 150–379)
RBC: 5.04 x10E6/uL (ref 4.14–5.80)
RDW: 14.1 % (ref 12.3–15.4)
WBC: 5.1 x10E3/uL (ref 3.4–10.8)

## 2017-07-13 LAB — LIPID PANEL
Chol/HDL Ratio: 3.7 ratio (ref 0.0–5.0)
Cholesterol, Total: 129 mg/dL (ref 100–199)
HDL: 35 mg/dL — ABNORMAL LOW
LDL Calculated: 61 mg/dL (ref 0–99)
Triglycerides: 166 mg/dL — ABNORMAL HIGH (ref 0–149)
VLDL Cholesterol Cal: 33 mg/dL (ref 5–40)

## 2017-07-13 LAB — URINALYSIS, DIPSTICK ONLY
BILIRUBIN UA: NEGATIVE
Ketones, UA: NEGATIVE
Leukocytes, UA: NEGATIVE
Nitrite, UA: NEGATIVE
PH UA: 6 (ref 5.0–7.5)
RBC UA: NEGATIVE
SPEC GRAV UA: 1.025 (ref 1.005–1.030)
UUROB: 0.2 mg/dL (ref 0.2–1.0)

## 2017-07-13 LAB — MICROALBUMIN / CREATININE URINE RATIO
Creatinine, Urine: 92.6 mg/dL
MICROALBUM., U, RANDOM: 103.3 ug/mL
Microalb/Creat Ratio: 111.6 mg/g creat — ABNORMAL HIGH (ref 0.0–30.0)

## 2017-07-13 LAB — PSA: Prostate Specific Ag, Serum: 0.5 ng/mL (ref 0.0–4.0)

## 2017-07-13 LAB — TSH: TSH: 3.45 u[IU]/mL (ref 0.450–4.500)

## 2017-07-21 ENCOUNTER — Encounter: Payer: Self-pay | Admitting: Physician Assistant

## 2017-08-21 ENCOUNTER — Ambulatory Visit: Payer: BC Managed Care – PPO | Admitting: Endocrinology

## 2017-09-16 ENCOUNTER — Encounter (INDEPENDENT_AMBULATORY_CARE_PROVIDER_SITE_OTHER): Payer: Self-pay

## 2017-09-23 ENCOUNTER — Ambulatory Visit: Payer: BC Managed Care – PPO | Admitting: Podiatry

## 2017-10-06 ENCOUNTER — Ambulatory Visit: Payer: BC Managed Care – PPO | Admitting: Endocrinology

## 2017-10-29 ENCOUNTER — Ambulatory Visit (INDEPENDENT_AMBULATORY_CARE_PROVIDER_SITE_OTHER): Payer: BC Managed Care – PPO

## 2017-10-29 ENCOUNTER — Ambulatory Visit: Payer: BC Managed Care – PPO | Admitting: Podiatry

## 2017-10-29 ENCOUNTER — Encounter: Payer: Self-pay | Admitting: Podiatry

## 2017-10-29 DIAGNOSIS — M898X9 Other specified disorders of bone, unspecified site: Secondary | ICD-10-CM | POA: Diagnosis not present

## 2017-10-29 DIAGNOSIS — B351 Tinea unguium: Secondary | ICD-10-CM | POA: Diagnosis not present

## 2017-10-29 DIAGNOSIS — M79674 Pain in right toe(s): Secondary | ICD-10-CM | POA: Diagnosis not present

## 2017-10-29 DIAGNOSIS — E119 Type 2 diabetes mellitus without complications: Secondary | ICD-10-CM

## 2017-10-29 DIAGNOSIS — M79675 Pain in left toe(s): Secondary | ICD-10-CM

## 2017-10-29 NOTE — Progress Notes (Addendum)
This patient presents to the office with chief complaint of long thick nails diabetes and painful callus left foot.  Patient says he was referred to this office by his medical doctor for an evaluation of callus on the outside of his left foot.   This patient says there are long thick painful nails.  These nails are painful walking and wearing shoes.  Patient has no history of infection or drainage from both feet.  Patient is unable to  self treat his own nails Patient has painful callus on the outside of his left foot which he says improves when the callus is trimmed.. This patient presents  to the office today for treatment of the  long nails and a foot evaluation due to history of  diabetes.  General Appearance  Alert, conversant and in no acute stress.  Vascular  Dorsalis pedis and posterior tibial  pulses are palpable  bilaterally.  Capillary return is within normal limits  bilaterally. Temperature is within normal limits  bilaterally.  Neurologic  Senn-Weinstein monofilament wire test within normal limits  bilaterally. Muscle power within normal limits bilaterally.  Nails Thick disfigured discolored nails with subungual debris  from hallux to fifth toes bilaterally. No evidence of bacterial infection or drainage bilaterally.  Orthopedic  No limitations of motion of motion feet .  No crepitus or effusions noted. HAV  B/L.  Bony enlargement at the fifth metabase   Skin  normotropic skin with no porokeratosis noted bilaterally.  No signs of infections or ulcers noted.  Callus noted on the lateral aspect fifth metabase left foot.   Onychomycosis  Callus left foot secondary to old fracture fifth metatarsal base left foot.  IE  Debride nails x 10.  Debridement of callus left foot.  X-rays were reveal a nonunion fracture at the base of the fifth metatarsal left foot   This patient was unaware of any injury or fracture to his left foot.  The proximal aspect of the fifth metatarsal base is unattached and  causing a bony enlargement at the base of the fifth metatarsal.   A diabetic foot exam was performed and there is no evidence of any vascular or neurologic pathology. This patient was evaluated by Liliane Channel.  Rick recommended a brace with a pocket at the cuboid as an effort to offload pressure.  He would make the brace with Plastizote.  Patient was to follow up with Liliane Channel to receive  the brace and to return to the office in 3 months for further evaluation and treatment by myself.  RTC 3 months.   Gardiner Barefoot DPM

## 2017-10-31 ENCOUNTER — Ambulatory Visit: Payer: BC Managed Care – PPO | Admitting: Endocrinology

## 2017-11-28 ENCOUNTER — Encounter: Payer: Self-pay | Admitting: Endocrinology

## 2017-11-28 ENCOUNTER — Ambulatory Visit: Payer: BC Managed Care – PPO | Admitting: Endocrinology

## 2017-11-28 VITALS — BP 142/86 | HR 64 | Ht 71.0 in | Wt 326.0 lb

## 2017-11-28 DIAGNOSIS — E11319 Type 2 diabetes mellitus with unspecified diabetic retinopathy without macular edema: Secondary | ICD-10-CM

## 2017-11-28 DIAGNOSIS — Z794 Long term (current) use of insulin: Secondary | ICD-10-CM | POA: Diagnosis not present

## 2017-11-28 DIAGNOSIS — E1169 Type 2 diabetes mellitus with other specified complication: Secondary | ICD-10-CM

## 2017-11-28 DIAGNOSIS — E669 Obesity, unspecified: Secondary | ICD-10-CM

## 2017-11-28 LAB — POCT GLYCOSYLATED HEMOGLOBIN (HGB A1C): Hemoglobin A1C: 9.6 % — AB (ref 4.0–5.6)

## 2017-11-28 MED ORDER — INSULIN DETEMIR 100 UNIT/ML ~~LOC~~ SOLN: 80.0000 [IU] | SUBCUTANEOUS | 2 refills | Status: DC

## 2017-11-28 NOTE — Progress Notes (Signed)
Subjective:    Patient ID: Justin Houston, male    DOB: Dec 22, 1965, 52 y.o.   MRN: [id number]  HPI Pt returns for f/u of diabetes mellitus: DM type: Insulin-requiring type 2.   Dx'ed: 6387 Complications: NPDR Therapy: insulin since soon after dx, and 2 oral meds DKA: never Severe hypoglycemia: never Pancreatitis: never Pancreatic imaging: never  Other: he is not a candidate for multiple daily injections, as he does not check cbg's Interval history: pt states he feels well in general.  He takes the levemir as rx'ed.  He says he never misses it.   Past Medical History:  Diagnosis Date  . Dermatitis   . Diabetes mellitus   . GERD (gastroesophageal reflux disease)   . Hyperlipidemia   . Hypertension   . Lipoma of scalp   . Obesity     Past Surgical History:  Procedure Laterality Date  . COLONOSCOPY WITH PROPOFOL N/A 12/01/2014   Procedure: COLONOSCOPY WITH PROPOFOL;  Surgeon: [name], MD;  Location: [facility];  Service: Endoscopy;  Laterality: N/A;  . right arm ORIF      Social History   Socioeconomic History  . Marital status: Single    Spouse name: n/a  . Number of children: 0  . Years of education: 54  . Highest education level: Not on file  Occupational History  . Occupation: Software engineer: [employer]  Social Needs  . Financial resource strain: Not on file  . Food insecurity:    Worry: Not on file    Inability: Not on file  . Transportation needs:    Medical: Not on file    Non-medical: Not on file  Tobacco Use  . Smoking status: Former Smoker    Last attempt to quit: 09/05/1994    Years since quitting: 23.2  . Smokeless tobacco: Never Used  Substance and Sexual Activity  . Alcohol use: No    Alcohol/week: 0.0 standard drinks  . Drug use: No  . Sexual activity: Never  Lifestyle  . Physical activity:    Days per week: Not on file    Minutes per session: Not on file  . Stress: Not on file  Relationships  .  Social connections:    Talks on phone: Not on file    Gets together: Not on file    Attends religious service: Not on file    Active member of club or organization: Not on file    Attends meetings of clubs or organizations: Not on file    Relationship status: Not on file  . Intimate partner violence:    Fear of current or ex partner: Not on file    Emotionally abused: Not on file    Physically abused: Not on file    Forced sexual activity: Not on file  Other Topics Concern  . Not on file  Social History Narrative   Master's Degree in Adult Education.  Lives alone.   No siblings.    Current Outpatient Medications on File Prior to Visit  Medication Sig Dispense Refill  . ALPHAGAN P 0.1 % SOLN     . amLODipine (NORVASC) 10 MG tablet Take 1 tablet (10 mg total) by mouth daily. 90 tablet 3  . atenolol (TENORMIN) 50 MG tablet Take 1 tablet (50 mg total) by mouth daily. 90 tablet 3  . atorvastatin (LIPITOR) 40 MG tablet Take 1 tablet (40 mg total) by mouth daily. 90 tablet 3  . B-D  INS SYR ULTRAFINE 1CC/31G 31G X 5/16" 1 ML MISC USE AS DIRECTED 100 each 0  . chlorthalidone (HYGROTON) 25 MG tablet Take 1 tablet (25 mg total) by mouth daily. 90 tablet 3  . clobetasol ointment (TEMOVATE) 4.16 % Apply 1 application topically 3 (three) times daily as needed (rash.). 60 g 1  . colchicine 0.6 MG tablet Take 1 tablet (0.6 mg total) by mouth 2 (two) times daily as needed. 10 tablet 0  . dapagliflozin propanediol (FARXIGA) 5 MG TABS tablet Take 5 mg by mouth daily. 90 tablet 3  . glucose blood (CONTOUR NEXT TEST) test strip 1 each by Other route 2 (two) times daily. And lancets 2/day 100 each 12  . hydrALAZINE (APRESOLINE) 25 MG tablet Take 1 tablet (25 mg total) by mouth 3 (three) times daily. 90 tablet 1  . metFORMIN (GLUCOPHAGE) 1000 MG tablet Take 1 tablet (1,000 mg total) by mouth 2 (two) times daily with a meal. 180 tablet 3  . naproxen sodium (ALEVE) 220 MG tablet Take 2 tablets (440 mg total)  by mouth 2 (two) times daily as needed (pain). 180 tablet 0  . ranitidine (ZANTAC) 150 MG tablet Take 1 tablet (150 mg total) by mouth every morning. 90 tablet 3   No current facility-administered medications on file prior to visit.     Allergies  Allergen Reactions  . Losartan Potassium-Hctz Hives    Family History  Problem Relation Age of Onset  . Stroke Mother   . Hypertension Mother   . Diabetes Maternal Grandmother   . Prostate cancer Maternal Grandfather   . Diabetes Other     BP (!) 142/86   Pulse 64   Ht 5\' 11"  (1.803 m)   Wt (!) 326 lb (147.9 kg)   SpO2 97%   BMI 45.47 kg/m    Review of Systems He denies hypoglycemia    Objective:   Physical Exam VITAL SIGNS:  See vs page GENERAL: no distress Pulses: foot pulses are intact bilaterally.   MSK: no deformity of the feet or ankles.  CV: trace bilat edema of the legs, and large bilat vv's of the legs.  Skin:  no ulcer on the feet or ankles.  normal temp on the feet and ankles.  There is patchy hyperpigmentation of the feet.   Neuro: sensation is intact to touch on the feet and ankles.   Ext: There is bilateral onychomycosis of the toenails.   Lab Results  Component Value Date   HGBA1C 9.6 (A) 11/28/2017       Assessment & Plan:  Your blood pressure is high today.  Please see your primary care provider soon, to have it rechecked Insulin-requiring type 2 DM, with DR: worse  Patient Instructions  Your blood pressure is high today.  Please see your primary care provider soon, to have it rechecked. check your blood sugar twice a day.  vary the time of day when you check, between before the 3 meals, and at bedtime.  also check if you have symptoms of your blood sugar being too high or too low.  please keep a record of the readings and bring it to your next appointment here (or you can bring the meter itself).  You can write it on any piece of paper.  please call us sooner if your blood sugar goes below 70, or if  you have a lot of readings over 200.   Please increase the levemir to 80 units each morning Please come back  for a follow-up appointment in 2 months.

## 2017-11-28 NOTE — Patient Instructions (Addendum)
Your blood pressure is high today.  Please see your primary care provider soon, to have it rechecked. check your blood sugar twice a day.  vary the time of day when you check, between before the 3 meals, and at bedtime.  also check if you have symptoms of your blood sugar being too high or too low.  please keep a record of the readings and bring it to your next appointment here (or you can bring the meter itself).  You can write it on any piece of paper.  please call us sooner if your blood sugar goes below 70, or if you have a lot of readings over 200.   Please increase the levemir to 80 units each morning Please come back for a follow-up appointment in 2 months.

## 2017-12-13 ENCOUNTER — Other Ambulatory Visit: Payer: Self-pay | Admitting: Endocrinology

## 2017-12-13 DIAGNOSIS — E1169 Type 2 diabetes mellitus with other specified complication: Secondary | ICD-10-CM

## 2017-12-13 DIAGNOSIS — E669 Obesity, unspecified: Principal | ICD-10-CM

## 2018-01-17 LAB — GLUCOSE, POCT (MANUAL RESULT ENTRY): POC GLUCOSE: 178 mg/dL — AB (ref 70–99)

## 2018-01-19 ENCOUNTER — Telehealth: Payer: Self-pay | Admitting: Physician Assistant

## 2018-01-19 DIAGNOSIS — I10 Essential (primary) hypertension: Secondary | ICD-10-CM

## 2018-01-19 NOTE — Telephone Encounter (Signed)
Copied from [place] [id number]. Topic: General - Other >> Jan 19, 2018 11:36 AM Lennox Solders wrote: Reason for CRM: pt needs a refill on hydralazine 25 mg . Pt said pharm has contacted [place] . Pt has taken has last pill. [address]. Pt last see chelle jeffrey in mar 2019

## 2018-01-20 ENCOUNTER — Telehealth: Payer: Self-pay | Admitting: Physician Assistant

## 2018-01-20 ENCOUNTER — Other Ambulatory Visit: Payer: Self-pay | Admitting: *Deleted

## 2018-01-20 DIAGNOSIS — I10 Essential (primary) hypertension: Secondary | ICD-10-CM

## 2018-01-20 MED ORDER — AMLODIPINE BESYLATE 10 MG PO TABS: 10.0000 mg | ORAL_TABLET | Freq: Every day | ORAL | 0 refills | Status: DC

## 2018-01-20 NOTE — Telephone Encounter (Signed)
Attempted to contact pt; last office visit 07/12/17 with Harrison Mons at [facility]; no upcoming visits noted; left message on voicemail [phone]; when pt calls back please schedule appointment for transfer of care.

## 2018-01-21 NOTE — Telephone Encounter (Signed)
Patient calling and states that he received a voicemail about making an appointment to get further refills. Patient states that he currently does not have the funds for a co-pay at this time. Would like to know if he could get a few hydrALAZINE (APRESOLINE) 25 MG tablet sent to the pharmacy until he can get the funds together. Please advise. [place], [place]

## 2018-01-22 NOTE — Telephone Encounter (Signed)
Pt's message sent to Dr. Pamella Pert to advise on refilll hydralazine 25mg .  Pt of Justin Houston. No follow up appts-pt states he does not have funds for co pay.

## 2018-01-25 MED ORDER — HYDRALAZINE HCL 25 MG PO TABS: 25.0000 mg | ORAL_TABLET | Freq: Three times a day (TID) | ORAL | 0 refills | Status: DC

## 2018-01-25 NOTE — Telephone Encounter (Signed)
Med refilled for 30 days.  He needs to come in to establish care before next refill is due Thanks

## 2018-01-25 NOTE — Addendum Note (Signed)
Addended by: Rutherford Guys on: 01/25/2018 12:02 PM   Modules accepted: Orders

## 2018-01-28 ENCOUNTER — Ambulatory Visit: Payer: BC Managed Care – PPO | Admitting: Endocrinology

## 2018-01-28 DIAGNOSIS — Z0289 Encounter for other administrative examinations: Secondary | ICD-10-CM

## 2018-02-11 ENCOUNTER — Ambulatory Visit: Payer: BC Managed Care – PPO | Admitting: Podiatry

## 2018-03-22 ENCOUNTER — Other Ambulatory Visit: Payer: Self-pay | Admitting: Family Medicine

## 2018-03-22 DIAGNOSIS — I10 Essential (primary) hypertension: Secondary | ICD-10-CM

## 2018-04-21 ENCOUNTER — Telehealth: Payer: Self-pay | Admitting: Endocrinology

## 2018-04-21 NOTE — Telephone Encounter (Signed)
Please refer to Dr. Ellison's request below 

## 2018-04-21 NOTE — Telephone Encounter (Signed)
please call patient: Ov is due 

## 2018-04-21 NOTE — Telephone Encounter (Signed)
LMTCB to reschedule missed appointment °

## 2018-04-29 ENCOUNTER — Encounter

## 2018-04-29 ENCOUNTER — Ambulatory Visit: Payer: BC Managed Care – PPO | Admitting: Podiatry

## 2018-05-15 ENCOUNTER — Other Ambulatory Visit: Payer: Self-pay | Admitting: Endocrinology

## 2018-05-15 DIAGNOSIS — E1169 Type 2 diabetes mellitus with other specified complication: Secondary | ICD-10-CM

## 2018-05-15 DIAGNOSIS — E669 Obesity, unspecified: Principal | ICD-10-CM

## 2018-05-16 NOTE — Telephone Encounter (Signed)
Please refill x 1 Ov is due  

## 2018-06-10 ENCOUNTER — Other Ambulatory Visit: Payer: Self-pay | Admitting: Critical Care Medicine

## 2018-06-10 ENCOUNTER — Encounter: Payer: Self-pay | Admitting: Critical Care Medicine

## 2018-06-10 DIAGNOSIS — E669 Obesity, unspecified: Secondary | ICD-10-CM

## 2018-06-10 DIAGNOSIS — K219 Gastro-esophageal reflux disease without esophagitis: Secondary | ICD-10-CM

## 2018-06-10 DIAGNOSIS — E1169 Type 2 diabetes mellitus with other specified complication: Secondary | ICD-10-CM

## 2018-06-10 DIAGNOSIS — I10 Essential (primary) hypertension: Secondary | ICD-10-CM

## 2018-06-10 DIAGNOSIS — E785 Hyperlipidemia, unspecified: Secondary | ICD-10-CM

## 2018-06-10 DIAGNOSIS — M1A4710 Other secondary chronic gout, right ankle and foot, without tophus (tophi): Secondary | ICD-10-CM

## 2018-06-10 MED ORDER — ATENOLOL 50 MG PO TABS: 50.0000 mg | ORAL_TABLET | Freq: Every day | ORAL | 3 refills | Status: DC

## 2018-06-10 MED ORDER — AMLODIPINE BESYLATE 10 MG PO TABS: 10.0000 mg | ORAL_TABLET | Freq: Every day | ORAL | 0 refills | Status: DC

## 2018-06-10 MED ORDER — HYDRALAZINE HCL 25 MG PO TABS: 25.0000 mg | ORAL_TABLET | Freq: Three times a day (TID) | ORAL | 0 refills | Status: DC

## 2018-06-10 MED ORDER — ALPHAGAN P 0.1 % OP SOLN: 1.0000 [drp] | Freq: Two times a day (BID) | OPHTHALMIC | 1 refills | Status: DC

## 2018-06-10 MED ORDER — RANITIDINE HCL 150 MG PO TABS: 150.0000 mg | ORAL_TABLET | Freq: Every morning | ORAL | 3 refills | Status: DC

## 2018-06-10 MED ORDER — METFORMIN HCL 1000 MG PO TABS: 1000.0000 mg | ORAL_TABLET | Freq: Two times a day (BID) | ORAL | 3 refills | Status: DC

## 2018-06-10 MED ORDER — CHLORTHALIDONE 25 MG PO TABS: 25.0000 mg | ORAL_TABLET | Freq: Every day | ORAL | 3 refills | Status: DC

## 2018-06-10 MED ORDER — COLCHICINE 0.6 MG PO TABS: 0.6000 mg | ORAL_TABLET | Freq: Two times a day (BID) | ORAL | 0 refills | Status: DC | PRN

## 2018-06-10 MED ORDER — INSULIN DETEMIR 100 UNIT/ML ~~LOC~~ SOLN: SUBCUTANEOUS | 0 refills | Status: DC

## 2018-06-10 MED ORDER — ATORVASTATIN CALCIUM 40 MG PO TABS: 40.0000 mg | ORAL_TABLET | Freq: Every day | ORAL | 3 refills | Status: DC

## 2018-06-10 MED ORDER — INSULIN PEN NEEDLE 30G X 5 MM MISC: 1.0000 | Freq: Every day | 0 refills | Status: DC

## 2018-06-10 MED ORDER — DAPAGLIFLOZIN PROPANEDIOL 5 MG PO TABS: 5.0000 mg | ORAL_TABLET | Freq: Every day | ORAL | 3 refills | Status: DC

## 2018-06-10 NOTE — Progress Notes (Signed)
Refills for meds.

## 2018-06-11 ENCOUNTER — Telehealth: Payer: Self-pay | Admitting: Physician Assistant

## 2018-06-11 NOTE — Telephone Encounter (Signed)
[facility]iann [facility] in regards to the insulin pen needles. In order to help Clarify directions and gauge. Please follow up.

## 2018-06-11 NOTE — Progress Notes (Signed)
This is a pleasant 53 year old male who is coming to the [facility] on referral by [place] at his [employer].  Note the patient does have housing.  He has a history of diabetes and hypertension has been out of his medications for several weeks.  He previously saw a primary care physician at [facility] but that physician has now moved to a [facility] site on [address].  The patient has lost his health insurance and is now needing access to care.  He has had diabetes since 1997 and hypertension occurred before that.  He has previously been on amlodipine, atenolol, atorvastatin, chlorthalidone, colchicine, Farxiga, hydralazine, Levemir insulin, metformin, ranitidine.  All of these medications and in addition to that eyedrops are needed for refill.  The patient has history of glaucoma.  His last hemoglobin A1c was at 7.5.  On exam blood pressure 160/92 saturation 98% room air pulse 65  Blood sugar today was 102  Chest exam showed to be clear cardiac exam showed a regular rate and rhythm without S3 or S4 abdomen was protuberant bowel sounds active no edema abnormality seen extremities showed 1+ edema neurologic exam was intact  Impression is that of diabetes with improved control and hypertension that is not well controlled as the patient is out of his current medications.  Plan [facility] until we can get [facility] care  We sent the following alfalfa given eyedrops 1 drop in both eyes twice daily, amlodipine 10 mg daily, atenolol 50 mg daily, atorvastatin 40 mg daily, chlorthalidone 25 mg daily, colchicine 0.6 mg twice daily as needed, Farxiga 5 mg daily, hydralazine 25 mg 3 times daily, Levemir insulin 80 units once daily, metformin 1000 mg twice daily, ranitidine 150 mg daily.  Follow-up will be in the [facility]

## 2018-06-12 ENCOUNTER — Telehealth: Payer: Self-pay | Admitting: Physician Assistant

## 2018-06-12 NOTE — Telephone Encounter (Signed)
Confirmed w/ pharmacist Juliann Pulse, she is filling RX today.

## 2018-06-12 NOTE — Telephone Encounter (Signed)
Follow up   [facility] is calling states they were sent pin needles but says the insulin is a vail and need syringes. Please f/u

## 2018-06-12 NOTE — Telephone Encounter (Signed)
Pharmacy told to switch Levemir vials to pens since the pen is more stable outside of fridge and Dr. Joya Gaskins sent RX for pen needles instead of syringes.

## 2018-07-01 ENCOUNTER — Other Ambulatory Visit: Payer: Self-pay | Admitting: Critical Care Medicine

## 2018-07-01 ENCOUNTER — Encounter: Payer: Self-pay | Admitting: Critical Care Medicine

## 2018-07-01 ENCOUNTER — Encounter: Payer: Self-pay | Admitting: Pediatric Intensive Care

## 2018-07-01 DIAGNOSIS — E119 Type 2 diabetes mellitus without complications: Secondary | ICD-10-CM

## 2018-07-01 LAB — GLUCOSE, POCT (MANUAL RESULT ENTRY): POC Glucose: 260 mg/dl — AB (ref 70–99)

## 2018-07-01 NOTE — Progress Notes (Signed)
Needs appt,   No med changes

## 2018-07-02 NOTE — [facility] Nurse Program (Signed)
  Dept: [facility] Nurse Program Note  Date of Encounter: 07/01/2018  Past Medical History: Past Medical History:  Diagnosis Date  . Dermatitis   . Diabetes mellitus   . GERD (gastroesophageal reflux disease)   . Hyperlipidemia   . Hypertension   . Lipoma of scalp   . Obesity     Encounter Details BG/BP check. :Patient encounter with Dr Joya Gaskins. CN to arrange clinic appointment for [facility] next week. Lisette Abu RN BSN CNP [phone]

## 2018-07-02 NOTE — Progress Notes (Signed)
This is a 53 year old male seen back in return follow-up in the [facility] after having been seen previously 2 weeks ago.  Patient has hypertension and type 2 diabetes.  His hemoglobin A1c has been more than 9 previously.  The patient at the last visit had hypertensive medications adjusted and resent to his pharmacy.  He is not checking his blood sugars regularly.  He does note an increased cough of thin white mucus.  He has occasional sinus congestion.  Occasional wheezing.  The patient was concerned that he might have an infection.  He has no fever.  No exposure histories for COVID  Note medications include amlodipine 10 mg daily, atenolol 50 mg daily, Lipitor 40 mg daily, chlorthalidone 25 mg daily, 2 different eyedrops, hydralazine 25 mg 3 times daily colchicine 0.6 mg twice daily, insulin Levemir 80 units daily, metformin at thousand milligrams twice daily, ranitidine 150 mg daily, Naprosyn twice daily as needed, Farxiga 5 mg daily  On exam temp 98 blood pressure 160/90 pulse 73 saturation 98% room air   CBG today was 260 Chest was completely clear without evidence of wheeze rales or rhonchi Cardiac exam showed a regular rate and rhythm normal S1-S2 no S3 no S4 no murmur Abdomen was benign Extremities showed no edema Ears were clear and nares showed mild turbinate edema but no purulence and oropharynx was clear without erythema  Impression is that of allergic rhinitis without evidence of acute infection Hypertension is reasonably controlled at this time diabetes not as well-controlled  Plan will be for the patient obtain a clinic appointment [facility] and we did achieve this visit within the next week so the patient can establish there for primary care

## 2018-07-07 ENCOUNTER — Ambulatory Visit: Payer: BC Managed Care – PPO | Admitting: Critical Care Medicine

## 2018-08-06 ENCOUNTER — Other Ambulatory Visit: Payer: Self-pay | Admitting: Physician Assistant

## 2018-08-06 DIAGNOSIS — I10 Essential (primary) hypertension: Secondary | ICD-10-CM

## 2018-08-06 MED ORDER — HYDRALAZINE HCL 25 MG PO TABS: 25.0000 mg | ORAL_TABLET | Freq: Three times a day (TID) | ORAL | 0 refills | Status: DC

## 2018-08-06 NOTE — Telephone Encounter (Signed)
PT was seen by you on 07/01/18, can you authorize the refill on this medication?

## 2018-08-06 NOTE — Telephone Encounter (Signed)
1) Medication(s) Requested (by name): hydrALAZINE 25mg   2) Pharmacy of Choice: [facility] - [place], [place] - [address] 3) Special Requests:   Approved medications will be sent to the pharmacy, we will reach out if there is an issue.  Requests made after 3pm may not be addressed until the following business day!  If a patient is unsure of the name of the medication(s) please note and ask patient to call back when they are able to provide all info, do not send to responsible party until all information is available!

## 2018-08-08 ENCOUNTER — Other Ambulatory Visit: Payer: Self-pay | Admitting: Critical Care Medicine

## 2018-08-08 DIAGNOSIS — I10 Essential (primary) hypertension: Secondary | ICD-10-CM

## 2018-08-08 DIAGNOSIS — K219 Gastro-esophageal reflux disease without esophagitis: Secondary | ICD-10-CM

## 2018-08-13 ENCOUNTER — Other Ambulatory Visit: Payer: Self-pay | Admitting: Pharmacist

## 2018-08-13 ENCOUNTER — Telehealth: Payer: Self-pay | Admitting: Pharmacist

## 2018-08-13 DIAGNOSIS — I10 Essential (primary) hypertension: Secondary | ICD-10-CM

## 2018-08-13 MED ORDER — HYDRALAZINE HCL 25 MG PO TABS: 25.0000 mg | ORAL_TABLET | Freq: Three times a day (TID) | ORAL | 0 refills | Status: DC

## 2018-08-13 NOTE — Telephone Encounter (Signed)
Ranitidine recalled and famotidine on backorder. Will route to PCP for consideration of PPI.

## 2018-08-13 NOTE — Telephone Encounter (Signed)
Patient called in regards to his ranitidine. Please follow up

## 2018-08-14 MED ORDER — OMEPRAZOLE 20 MG PO CPDR: 20.0000 mg | DELAYED_RELEASE_CAPSULE | Freq: Every day | ORAL | 3 refills | Status: DC

## 2018-08-14 NOTE — Telephone Encounter (Signed)
Ranitidine on back order. Omeprazole prescribed.

## 2018-08-17 MED ORDER — PANTOPRAZOLE SODIUM 40 MG PO TBEC: 40.0000 mg | DELAYED_RELEASE_TABLET | Freq: Every day | ORAL | 3 refills | Status: DC

## 2018-08-17 MED FILL — PANTOPRAZOLE SOD DR 40 MG T: 40 | 30 days supply | Qty: 30 | Fill #0 | Status: TO

## 2018-08-17 NOTE — Addendum Note (Signed)
Addended by: Asencion Noble E on: 08/17/2018 08:01 AM   Modules accepted: Orders

## 2018-08-17 NOTE — Telephone Encounter (Signed)
Pt missed his last OV.  Will need to reschedule  I sent protonix Rx in to our clinic pharmacy to mail to patient

## 2018-08-30 NOTE — Progress Notes (Signed)
Subjective:    Patient ID: Justin Houston, male    DOB: 1965/07/02, 53 y.o.   MRN: [id number]  This is a 53 year old male seen in the clinic for the first time after had previously been seen in the [facility] homeless shelter clinic.  The patient actually does have housing.  He has a history of type 2 diabetes hypertension.  The patient also comes in today with complaints of chronic low back pain left hip pain previous infection of the right groin.  The patient note has a CBG of 271 on arrival with a hemoglobin A1c of 9.6.  The patient has been managed with metformin at thousand milligrams twice daily and Farxiga 5 mg daily along with Levemir 80 units daily.  On his blood pressure the patient had been taking the atenolol 50 mg daily chlorthalidone 25 mg daily hydralazine 25 mg twice daily and had not been taking amlodipine as prescribed.  Note blood pressure is also quite elevated on arrival today in the office.  Note the patient also complains of fungus in the toenails and also of a mass on the posterior part of his right scrotal as well as an umbilical hernia  Note there is also history of diabetic retinopathy   The patient is here today to establish for primary care.     Past Medical History:  Diagnosis Date  . Dermatitis   . Diabetes mellitus   . GERD (gastroesophageal reflux disease)   . Hyperlipidemia   . Hypertension   . Lipoma of scalp   . Obesity      Family History  Problem Relation Age of Onset  . Stroke Mother   . Hypertension Mother   . Diabetes Maternal Grandmother   . Prostate cancer Maternal Grandfather   . Diabetes Other      Social History   Socioeconomic History  . Marital status: Single    Spouse name: n/a  . Number of children: 0  . Years of education: 87  . Highest education level: Not on file  Occupational History  . Occupation: Software engineer: [employer]  Social Needs  . Financial resource strain: Not on file  .  Food insecurity:    Worry: Not on file    Inability: Not on file  . Transportation needs:    Medical: Not on file    Non-medical: Not on file  Tobacco Use  . Smoking status: Former Smoker    Last attempt to quit: 09/05/1994    Years since quitting: 24.0  . Smokeless tobacco: Never Used  Substance and Sexual Activity  . Alcohol use: No    Alcohol/week: 0.0 standard drinks  . Drug use: No  . Sexual activity: Never  Lifestyle  . Physical activity:    Days per week: Not on file    Minutes per session: Not on file  . Stress: Not on file  Relationships  . Social connections:    Talks on phone: Not on file    Gets together: Not on file    Attends religious service: Not on file    Active member of club or organization: Not on file    Attends meetings of clubs or organizations: Not on file    Relationship status: Not on file  . Intimate partner violence:    Fear of current or ex partner: Not on file    Emotionally abused: Not on file    Physically abused: Not on file  Forced sexual activity: Not on file  Other Topics Concern  . Not on file  Social History Narrative   Master's Degree in Adult Education.  Lives alone.   No siblings.     Allergies  Allergen Reactions  . Cozaar [Losartan Potassium]     Hives   . Losartan Potassium-Hctz Hives     Outpatient Medications Prior to Visit  Medication Sig Dispense Refill  . chlorthalidone (HYGROTON) 25 MG tablet Take 1 tablet (25 mg total) by mouth daily. 30 tablet 3  . omeprazole (PRILOSEC) 20 MG capsule Take 1 capsule (20 mg total) by mouth daily. 30 capsule 3  . ALPHAGAN P 0.1 % SOLN Place 1 drop into both eyes 2 (two) times daily. 15 mL 1  . atenolol (TENORMIN) 50 MG tablet Take 1 tablet (50 mg total) by mouth daily. 30 tablet 3  . atorvastatin (LIPITOR) 40 MG tablet Take 1 tablet (40 mg total) by mouth daily. 30 tablet 3  . hydrALAZINE (APRESOLINE) 25 MG tablet Take 1 tablet (25 mg total) by mouth 3 (three) times daily. 90  tablet 0  . insulin detemir (LEVEMIR) 100 UNIT/ML injection INJECT 0.8 MLS (80 UNITS TOTAL) EVERY MORNING INTO THE SKIN 20 mL 0  . Insulin Pen Needle 30G X 5 MM MISC 1 Stick by Does not apply route daily. 100 each 0  . metFORMIN (GLUCOPHAGE) 1000 MG tablet Take 1 tablet (1,000 mg total) by mouth 2 (two) times daily with a meal. 60 tablet 3  . B-D INS SYR ULTRAFINE 1CC/31G 31G X 5/16" 1 ML MISC USE AS DIRECTED (Patient not taking: Reported on 08/31/2018) 100 each 0  . clobetasol ointment (TEMOVATE) 7.51 % Apply 1 application topically 3 (three) times daily as needed (rash.). (Patient not taking: Reported on 08/31/2018) 60 g 1  . glucose blood (CONTOUR NEXT TEST) test strip 1 each by Other route 2 (two) times daily. And lancets 2/day (Patient not taking: Reported on 08/31/2018) 100 each 12  . naproxen sodium (ALEVE) 220 MG tablet Take 2 tablets (440 mg total) by mouth 2 (two) times daily as needed (pain). (Patient not taking: Reported on 08/31/2018) 180 tablet 0  . amLODipine (NORVASC) 10 MG tablet Take 1 tablet (10 mg total) by mouth daily. (Patient not taking: Reported on 08/31/2018) 30 tablet 0  . colchicine 0.6 MG tablet Take 1 tablet (0.6 mg total) by mouth 2 (two) times daily as needed. (Patient not taking: Reported on 08/31/2018) 10 tablet 0  . dapagliflozin propanediol (FARXIGA) 5 MG TABS tablet Take 5 mg by mouth daily. (Patient not taking: Reported on 08/31/2018) 30 tablet 3  . hydrALAZINE (APRESOLINE) 25 MG tablet Take 1 tablet (25 mg total) by mouth 3 (three) times daily. (Patient not taking: Reported on 08/31/2018) 90 tablet 0  . pantoprazole (PROTONIX) 40 MG tablet Take 1 tablet (40 mg total) by mouth daily. (Patient not taking: Reported on 08/31/2018) 30 tablet 3   No facility-administered medications prior to visit.       Review of Systems  Constitutional: Negative.  Negative for fever.  HENT: Negative.   Eyes: Positive for visual disturbance.  Respiratory: Negative for cough, choking,  chest tightness, shortness of breath and wheezing.   Cardiovascular: Positive for leg swelling. Negative for chest pain and palpitations.       Varicose veins  Gastrointestinal: Positive for constipation. Negative for abdominal distention, abdominal pain, anal bleeding, blood in stool, diarrhea, nausea, rectal pain and vomiting.  Endocrine: Negative.   Genitourinary:  Negative.   Skin: Negative for rash.  Neurological: Negative.   Hematological: Negative.   Psychiatric/Behavioral: Negative.        Objective:   Physical Exam Vitals:   08/31/18 1019 08/31/18 1117  BP: (!) 175/93 (!) 170/90  Pulse: 79   Resp: 20   Temp: 98.9 F (37.2 C)   TempSrc: Oral   SpO2: 98%   Weight: (!) 316 lb (143.3 kg)    Wt Readings from Last 3 Encounters:  08/31/18 (!) 316 lb (143.3 kg)  11/28/17 (!) 326 lb (147.9 kg)  07/12/17 (!) 334 lb (151.5 kg)   Gen: Pleasant, obese, in no distress,  normal affect  ENT: No lesions,  mouth clear,  oropharynx clear, no postnasal drip, lipomatous lesion R posterior skull  Neck: No JVD, no TMG, no carotid bruits  Lungs: No use of accessory muscles, no dullness to percussion, clear without rales or rhonchi  Cardiovascular: RRR, heart sounds normal, no murmur or gallops, no peripheral edema, severe varicose veins  Abdomen: soft and NT, no HSM,  BS normal, umbilical hernia with part of bowel in hernia, this is reduceable  Musculoskeletal: No deformities, no cyanosis or clubbing  Neuro: alert, non focal  Skin: Warm, no lesions or rashes  BMP Latest Ref Rng & Units 07/12/2017 02/11/2017 11/12/2016  Glucose 65 - 99 mg/dL 220(H) 298(H) 147(H)  BUN 6 - 24 mg/dL 13 16 13   Creatinine 0.76 - 1.27 mg/dL 0.98 1.17 0.94  BUN/Creat Ratio 9 - 20 13 14 14   Sodium 134 - 144 mmol/L 138 137 142  Potassium 3.5 - 5.2 mmol/L 3.9 4.0 3.6  Chloride 96 - 106 mmol/L 100 95(L) 96  CO2 20 - 29 mmol/L 24 26 25   Calcium 8.7 - 10.2 mg/dL 8.9 9.8 9.8   Hepatic Function Panel      Component Value Date/Time   PROT 6.8 07/12/2017 1023   ALBUMIN 3.8 07/12/2017 1023   AST 16 07/12/2017 1023   ALT 20 07/12/2017 1023   ALKPHOS 111 07/12/2017 1023   BILITOT 1.0 07/12/2017 1023   CBC Latest Ref Rng & Units 07/12/2017 02/11/2017 08/16/2016  WBC 3.4 - 10.8 x10E3/uL 5.1 5.9 5.7  Hemoglobin 13.0 - 17.7 g/dL 15.4 16.2 15.2  Hematocrit 37.5 - 51.0 % 45.9 47.5 45.4  Platelets 150 - 379 x10E3/uL 179 198 184       Assessment & Plan:  I personally reviewed all images and lab data in the [facility] system as well as any outside material available during this office visit and agree with the  radiology impressions.   Hypertension Hypertension poorly controlled at this time  Plan will be to add back amlodipine 10 mg daily and increase hydralazine to 25 mg 3 times daily    Varicose veins of both legs with edema Severe bilateral varicosities of veins both lower extremities  I recommended the patient obtain thigh high compression stockings  GERD (gastroesophageal reflux disease) Gastroesophageal reflux disease and for this we will continue proton pump inhibitor daily  Diabetes mellitus type 2 with retinopathy ([place]) Diabetes type 2 with diabetic retinopathy Hemoglobin A1c at 9.6  Plan Referral to clinical pharmacist Refill Farxiga 5 mg daily Continue Levemir 80 units daily, will likely need to begin short acting insulin with meals Continue metformin at thousand milligrams twice daily  Hyperlipidemia associated with type 2 diabetes mellitus (HCC) Recent lipid panel showed LDL near goal we will continue atorvastatin for now at current dose 40 mg daily  Toenail fungus Severe bilateral  toenail fungus and diabetic foot  Referral to podiatry made  Diabetic retinopathy Patient was recommended to continue to follow-up with his eye specialist  Mass of head, right postauricular SQ 7x4cm This subcutaneous mass is been present for many years and I referred the patient to general  surgery for evaluation  Umbilical hernia without obstruction and without gangrene Umbilical hernia with bowel entrapment without gangrene or obstruction  Referral to general surgery was made   Justin Houston was seen today for back pain.  Diagnoses and all orders for this visit:  Type 2 diabetes mellitus with retinopathy, with long-term current use of insulin, macular edema presence unspecified, unspecified laterality, unspecified retinopathy severity (HCC)  Diabetes mellitus type 2 in obese (HCC) -     Glucose (CBG) -     HgB A1c -     metFORMIN (GLUCOPHAGE) 1000 MG tablet; Take 1 tablet (1,000 mg total) by mouth 2 (two) times daily with a meal. -     insulin detemir (LEVEMIR) 100 UNIT/ML injection; INJECT 0.8 MLS (80 UNITS TOTAL) EVERY MORNING INTO THE SKIN -     dapagliflozin propanediol (FARXIGA) 5 MG TABS tablet; Take 5 mg by mouth daily. 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  Essential hypertension -     hydrALAZINE (APRESOLINE) 25 MG tablet; Take 1 tablet (25 mg total) by mouth 3 (three) times daily. -     atenolol (TENORMIN) 50 MG tablet; Take 1 tablet (50 mg total) by mouth daily. -     amLODipine (NORVASC) 10 MG tablet; Take 1 tablet (10 mg total) by mouth daily.  Hyperlipidemia, unspecified hyperlipidemia type -     atorvastatin (LIPITOR) 40 MG tablet; Take 1 tablet (40 mg total) by mouth daily.  Toenail fungus -     Ambulatory referral to Podiatry  Umbilical hernia without obstruction and without gangrene -     Ambulatory referral to General Surgery  Lipoma of other specified sites -     Ambulatory referral to General Surgery  BMI 45.0-49.9, adult ([place])  Umbilical hernia, incarcerated  Glaucoma of both eyes, unspecified glaucoma type  Mass of head, right postauricular SQ 7x4cm  Moderate nonproliferative  diabetic retinopathy without macular edema associated with type 2 diabetes mellitus, unspecified laterality (HCC)  Varicose veins of both legs with edema  Gastroesophageal reflux disease without esophagitis  Hyperlipidemia associated with type 2 diabetes mellitus ([place])  Other orders -     Insulin Pen Needle 30G X 5 MM MISC; 1 Stick by Does not apply route daily. -     ALPHAGAN P 0.1 % SOLN; Place 1 drop into both eyes 2 (two) times daily. -     senna-docusate (PERI-COLACE) 8.6-50 MG tablet; Take 1 tablet by mouth daily.  Refills on all medications sent to the patient's local pharmacy

## 2018-08-31 ENCOUNTER — Encounter: Payer: Self-pay | Admitting: Critical Care Medicine

## 2018-08-31 ENCOUNTER — Ambulatory Visit: Payer: BC Managed Care – PPO | Attending: Critical Care Medicine | Admitting: Critical Care Medicine

## 2018-08-31 ENCOUNTER — Other Ambulatory Visit: Payer: Self-pay

## 2018-08-31 VITALS — BP 170/90 | HR 79 | Temp 98.9°F | Resp 20 | Wt 316.0 lb

## 2018-08-31 DIAGNOSIS — E669 Obesity, unspecified: Secondary | ICD-10-CM

## 2018-08-31 DIAGNOSIS — D1779 Benign lipomatous neoplasm of other sites: Secondary | ICD-10-CM

## 2018-08-31 DIAGNOSIS — M1A4710 Other secondary chronic gout, right ankle and foot, without tophus (tophi): Secondary | ICD-10-CM

## 2018-08-31 DIAGNOSIS — K420 Umbilical hernia with obstruction, without gangrene: Secondary | ICD-10-CM

## 2018-08-31 DIAGNOSIS — B351 Tinea unguium: Secondary | ICD-10-CM

## 2018-08-31 DIAGNOSIS — I10 Essential (primary) hypertension: Secondary | ICD-10-CM

## 2018-08-31 DIAGNOSIS — H409 Unspecified glaucoma: Secondary | ICD-10-CM

## 2018-08-31 DIAGNOSIS — E11319 Type 2 diabetes mellitus with unspecified diabetic retinopathy without macular edema: Secondary | ICD-10-CM

## 2018-08-31 DIAGNOSIS — E1169 Type 2 diabetes mellitus with other specified complication: Secondary | ICD-10-CM

## 2018-08-31 DIAGNOSIS — R220 Localized swelling, mass and lump, head: Secondary | ICD-10-CM

## 2018-08-31 DIAGNOSIS — Z794 Long term (current) use of insulin: Secondary | ICD-10-CM

## 2018-08-31 DIAGNOSIS — K219 Gastro-esophageal reflux disease without esophagitis: Secondary | ICD-10-CM

## 2018-08-31 DIAGNOSIS — Z6841 Body Mass Index (BMI) 40.0 and over, adult: Secondary | ICD-10-CM

## 2018-08-31 DIAGNOSIS — D179 Benign lipomatous neoplasm, unspecified: Secondary | ICD-10-CM | POA: Insufficient documentation

## 2018-08-31 DIAGNOSIS — I83893 Varicose veins of bilateral lower extremities with other complications: Secondary | ICD-10-CM

## 2018-08-31 DIAGNOSIS — K429 Umbilical hernia without obstruction or gangrene: Secondary | ICD-10-CM

## 2018-08-31 DIAGNOSIS — E785 Hyperlipidemia, unspecified: Secondary | ICD-10-CM

## 2018-08-31 DIAGNOSIS — E113399 Type 2 diabetes mellitus with moderate nonproliferative diabetic retinopathy without macular edema, unspecified eye: Secondary | ICD-10-CM | POA: Diagnosis not present

## 2018-08-31 HISTORY — DX: Tinea unguium: B35.1

## 2018-08-31 LAB — POCT GLYCOSYLATED HEMOGLOBIN (HGB A1C): HbA1c, POC (controlled diabetic range): 9.6 % — AB (ref 0.0–7.0)

## 2018-08-31 LAB — GLUCOSE, POCT (MANUAL RESULT ENTRY): POC Glucose: 271 mg/dl — AB (ref 70–99)

## 2018-08-31 MED ORDER — DAPAGLIFLOZIN PROPANEDIOL 5 MG PO TABS: 5.0000 mg | ORAL_TABLET | Freq: Every day | ORAL | 3 refills | Status: DC

## 2018-08-31 MED ORDER — ALPHAGAN P 0.1 % OP SOLN: 1.0000 [drp] | Freq: Two times a day (BID) | OPHTHALMIC | 1 refills | Status: DC

## 2018-08-31 MED ORDER — COLCHICINE 0.6 MG PO TABS: 0.6000 mg | ORAL_TABLET | Freq: Two times a day (BID) | ORAL | 1 refills | Status: DC | PRN

## 2018-08-31 MED ORDER — HYDRALAZINE HCL 25 MG PO TABS: 25.0000 mg | ORAL_TABLET | Freq: Three times a day (TID) | ORAL | 1 refills | Status: DC

## 2018-08-31 MED ORDER — ATENOLOL 50 MG PO TABS: 50.0000 mg | ORAL_TABLET | Freq: Every day | ORAL | 3 refills | Status: DC

## 2018-08-31 MED ORDER — METFORMIN HCL 1000 MG PO TABS: 1000.0000 mg | ORAL_TABLET | Freq: Two times a day (BID) | ORAL | 3 refills | Status: DC

## 2018-08-31 MED ORDER — INSULIN DETEMIR 100 UNIT/ML ~~LOC~~ SOLN: SUBCUTANEOUS | 1 refills | Status: DC

## 2018-08-31 MED ORDER — SENNOSIDES-DOCUSATE SODIUM 8.6-50 MG PO TABS: 1.0000 | ORAL_TABLET | Freq: Every day | ORAL | 2 refills | Status: DC

## 2018-08-31 MED ORDER — AMLODIPINE BESYLATE 10 MG PO TABS: 10.0000 mg | ORAL_TABLET | Freq: Every day | ORAL | 1 refills | Status: DC

## 2018-08-31 MED ORDER — INSULIN PEN NEEDLE 30G X 5 MM MISC: 1.0000 | Freq: Every day | 1 refills | Status: DC

## 2018-08-31 MED ORDER — ATORVASTATIN CALCIUM 40 MG PO TABS: 40.0000 mg | ORAL_TABLET | Freq: Every day | ORAL | 3 refills | Status: DC

## 2018-08-31 NOTE — Assessment & Plan Note (Signed)
Severe bilateral toenail fungus and diabetic foot  Referral to podiatry made

## 2018-08-31 NOTE — Assessment & Plan Note (Signed)
Hypertension poorly controlled at this time  Plan will be to add back amlodipine 10 mg daily and increase hydralazine to 25 mg 3 times daily

## 2018-08-31 NOTE — Assessment & Plan Note (Signed)
Recent lipid panel showed LDL near goal we will continue atorvastatin for now at current dose 40 mg daily

## 2018-08-31 NOTE — Assessment & Plan Note (Signed)
Patient was recommended to continue to follow-up with his eye specialist

## 2018-08-31 NOTE — Assessment & Plan Note (Signed)
Diabetes type 2 with diabetic retinopathy Hemoglobin A1c at 9.6  Plan Referral to clinical pharmacist Refill Farxiga 5 mg daily Continue Levemir 80 units daily, will likely need to begin short acting insulin with meals Continue metformin at thousand milligrams twice daily

## 2018-08-31 NOTE — Assessment & Plan Note (Signed)
Severe bilateral varicosities of veins both lower extremities  I recommended the patient obtain thigh high compression stockings

## 2018-08-31 NOTE — Patient Instructions (Addendum)
Referral to podiatry and general surgery was made  A referral to our clinical pharmacist was made follow-up with your diabetes  Begin amlodipine 1 daily and increase hydralazine to 1 3 times daily continue atenolol and chlorthalidone as you are currently taking  [facility]  Please schedule an eye exam and follow-up with your eye conditions  A stool softener was sent to the pharmacy   Return to see Dr. Joya Gaskins in 2 months  Follow exercises below  Hip Exercises Ask your health care provider which exercises are safe for you. Do exercises exactly as told by your health care provider and adjust them as directed. It is normal to feel mild stretching, pulling, tightness, or discomfort as you do these exercises, but you should stop right away if you feel sudden pain or your pain gets worse.Do not begin these exercises until told by your health care provider. Stretching and range of motion exercises These exercises warm up your muscles and joints and improve the movement and flexibility of your hip. These exercises also help to relieve pain, numbness, and tingling. Exercise A: Hamstrings, supine  1. Lie on your back. 2. Loop a belt or towel over the ball of your left / rightfoot. The ball of your foot is on the walking surface, right under your toes. 3. Straighten your left / rightknee and slowly pull on the belt to raise your leg. ? Do not let your left / right knee bend while you do this. ? Keep your other leg flat on the floor. ? Raise the left / right leg until you feel a gentle stretch behind your left / right knee or thigh. 4. Hold this position for __________ seconds. 5. Slowly return your leg to the starting position. Repeat __________ times. Complete this stretch __________ times a day. Exercise B: Hip rotators  1. Lie on your back on a firm surface. 2. Hold your left / right knee with your left / right hand. Hold your ankle  with your other hand. 3. Gently pull your left / right knee and rotate your lower leg toward your other shoulder. ? Pull until you feel a stretch in your buttocks. ? Keep your hips and shoulders firmly planted while you do this stretch. 4. Hold this position for __________ seconds. Repeat __________ times. Complete this stretch __________ times a day. Exercise C: V-sit (hamstrings and adductors)  1. Sit on the floor with your legs extended in a large "V" shape. Keep your knees straight during this exercise. 2. Start with your head and chest upright, then bend at your waist to reach for your left foot (position A). You should feel a stretch in your right inner thigh. 3. Hold this position for __________ seconds. Then slowly return to the upright position. 4. Bend at your waist to reach forward (position B). You should feel a stretch behind both of your thighs and knees. 5. Hold this position for __________ seconds. Then slowly return to the upright position. 6. Bend at your waist to reach for your right foot (position C). You should feel a stretch in your left inner thigh. 7. Hold this position for __________ seconds. Then slowly return to the upright position. Repeat __________ times. Complete this stretch __________ times a day. Exercise D: Lunge (hip flexors)  1. Place your left / right knee on the floor and bend your other knee so that is directly over your ankle. You should be half-kneeling. 2.  Keep good posture with your head over your shoulders. 3. Tighten your buttocks to point your tailbone downward. This helps your back to keep from arching too much. 4. You should feel a gentle stretch in the front of your left / right thigh and hip. If you do not feel any resistance, slightly slide your other foot forward and then slowly lunge forward so your knee once again lines up over your ankle. 5. Make sure your tailbone continues to point downward. 6. Hold this position for __________  seconds. Repeat __________ times. Complete this stretch __________ times a day. Strengthening exercises These exercises build strength and endurance in your hip. Endurance is the ability to use your muscles for a long time, even after they get tired. Exercise E: Bridge (hip extensors)  1. Lie on your back on a firm surface with your knees bent and your feet flat on the floor. 2. Tighten your buttocks muscles and lift your bottom off the floor until the trunk of your body is level with your thighs. ? Do not arch your back. ? You should feel the muscles working in your buttocks and the back of your thighs. If you do not feel these muscles, slide your feet 1-2 inches (2.5-5 cm) farther away from your buttocks. 3. Hold this position for __________ seconds. 4. Slowly lower your hips to the starting position. 5. Let your muscles relax completely between repetitions. 6. If this exercise is too easy, try doing it with your arms crossed over your chest. Repeat __________ times. Complete this exercise __________ times a day. Exercise F: Straight leg raises - hip abductors  1. Lie on your side with your left / right leg in the top position. Lie so your head, shoulder, knee, and hip line up with each other. You may bend your bottom knee to help you balance. 2. Roll your hips slightly forward, so your hips are stacked directly over each other and your left / right knee is facing forward. 3. Leading with your heel, lift your top leg 4-6 inches (10-15 cm). You should feel the muscles in your outer hip lifting. ? Do not let your foot drift forward. ? Do not let your knee roll toward the ceiling. 4. Hold this position for __________ seconds. 5. Slowly return to the starting position. 6. Let your muscles relax completely between repetitions. Repeat __________ times. Complete this exercise __________ times a day. Exercise G: Straight leg raises - hip adductors  1. Lie on your side with your left / right leg  in the bottom position. Lie so your head, shoulder, knee, and hip line up. You may place your upper foot in front to help you balance. 2. Roll your hips slightly forward, so your hips are stacked directly over each other and your left / right knee is facing forward. 3. Tense the muscles in your inner thigh and lift your bottom leg 4-6 inches (10-15 cm). 4. Hold this position for __________ seconds. 5. Slowly return to the starting position. 6. Let your muscles relax completely between repetitions. Repeat __________ times. Complete this exercise __________ times a day. Exercise H: Straight leg raises - quadriceps  1. Lie on your back with your left / right leg extended and your other knee bent. 2. Tense the muscles in the front of your left / right thigh. When you do this, you should see your kneecap slide up or see increased dimpling just above your knee. 3. Tighten these muscles even more and raise your  leg 4-6 inches (10-15 cm) off the floor. 4. Hold this position for __________ seconds. 5. Keep these muscles tense as you lower your leg. 6. Relax the muscles slowly and completely between repetitions. Repeat __________ times. Complete this exercise __________ times a day. Exercise I: Hip abductors, standing 1. Tie one end of a rubber exercise band or tubing to a secure surface, such as a table or pole. 2. Loop the other end of the band or tubing around your left / right ankle. 3. Keeping your ankle with the band or tubing directly opposite of the secured end, step away until there is tension in the tubing or band. Hold onto a chair as needed for balance. 4. Lift your left / right leg out to your side. While you do this: ? Keep your back upright. ? Keep your shoulders over your hips. ? Keep your toes pointing forward. ? Make sure to use your hip muscles to lift your leg. Do not "throw" your leg or tip your body to lift your leg. 5. Hold this position for __________ seconds. 6. Slowly return  to the starting position. Repeat __________ times. Complete this exercise __________ times a day. Exercise J: Squats (quadriceps) 1. Stand in a door frame so your feet and knees are in line with the frame. You may place your hands on the frame for balance. 2. Slowly bend your knees and lower your hips like you are going to sit in a chair. ? Keep your lower legs in a straight-up-and-down position. ? Do not let your hips go lower than your knees. ? Do not bend your knees lower than told by your health care provider. ? If your hip pain increases, do not bend as low. 3. Hold this position for ___________ seconds. 4. Slowly push with your legs to return to standing. Do not use your hands to pull yourself to standing. Repeat __________ times. Complete this exercise __________ times a day. This information is not intended to replace advice given to you by your health care provider. Make sure you discuss any questions you have with your health care provider. Document Released: 04/19/2005 Document Revised: 08/05/2017 Document Reviewed: 03/27/2015 Elsevier Interactive Patient Education  2019 [employer]  Back Exercises The following exercises strengthen the muscles that help to support the back. They also help to keep the lower back flexible. Doing these exercises can help to prevent back pain or lessen existing pain. If you have back pain or discomfort, try doing these exercises 2-3 times each day or as told by your health care provider. When the pain goes away, do them once each day, but increase the number of times that you repeat the steps for each exercise (do more repetitions). If you do not have back pain or discomfort, do these exercises once each day or as told by your health care provider. Exercises Single Knee to Chest Repeat these steps 3-5 times for each leg: 1. Lie on your back on a firm bed or the floor with your legs extended. 2. Bring one knee to your chest. Your other leg should  stay extended and in contact with the floor. 3. Hold your knee in place by grabbing your knee or thigh. 4. Pull on your knee until you feel a gentle stretch in your lower back. 5. Hold the stretch for 10-30 seconds. 6. Slowly release and straighten your leg. Pelvic Tilt Repeat these steps 5-10 times: 1. Lie on your back on a firm bed or the floor with  your legs extended. 2. Bend your knees so they are pointing toward the ceiling and your feet are flat on the floor. 3. Tighten your lower abdominal muscles to press your lower back against the floor. This motion will tilt your pelvis so your tailbone points up toward the ceiling instead of pointing to your feet or the floor. 4. With gentle tension and even breathing, hold this position for 5-10 seconds. Cat-Cow Repeat these steps until your lower back becomes more flexible: 1. Get into a hands-and-knees position on a firm surface. Keep your hands under your shoulders, and keep your knees under your hips. You may place padding under your knees for comfort. 2. Let your head hang down, and point your tailbone toward the floor so your lower back becomes rounded like the back of a cat. 3. Hold this position for 5 seconds. 4. Slowly lift your head and point your tailbone up toward the ceiling so your back forms a sagging arch like the back of a cow. 5. Hold this position for 5 seconds.  Press-Ups Repeat these steps 5-10 times: 1. Lie on your abdomen (face-down) on the floor. 2. Place your palms near your head, about shoulder-width apart. 3. While you keep your back as relaxed as possible and keep your hips on the floor, slowly straighten your arms to raise the top half of your body and lift your shoulders. Do not use your back muscles to raise your upper torso. You may adjust the placement of your hands to make yourself more comfortable. 4. Hold this position for 5 seconds while you keep your back relaxed. 5. Slowly return to lying flat on the  floor.  Bridges Repeat these steps 10 times: 1. Lie on your back on a firm surface. 2. Bend your knees so they are pointing toward the ceiling and your feet are flat on the floor. 3. Tighten your buttocks muscles and lift your buttocks off of the floor until your waist is at almost the same height as your knees. You should feel the muscles working in your buttocks and the back of your thighs. If you do not feel these muscles, slide your feet 1-2 inches farther away from your buttocks. 4. Hold this position for 3-5 seconds. 5. Slowly lower your hips to the starting position, and allow your buttocks muscles to relax completely. If this exercise is too easy, try doing it with your arms crossed over your chest. Abdominal Crunches Repeat these steps 5-10 times: 1. Lie on your back on a firm bed or the floor with your legs extended. 2. Bend your knees so they are pointing toward the ceiling and your feet are flat on the floor. 3. Cross your arms over your chest. 4. Tip your chin slightly toward your chest without bending your neck. 5. Tighten your abdominal muscles and slowly raise your trunk (torso) high enough to lift your shoulder blades a tiny bit off of the floor. Avoid raising your torso higher than that, because it can put too much stress on your low back and it does not help to strengthen your abdominal muscles. 6. Slowly return to your starting position. Back Lifts Repeat these steps 5-10 times: 1. Lie on your abdomen (face-down) with your arms at your sides, and rest your forehead on the floor. 2. Tighten the muscles in your legs and your buttocks. 3. Slowly lift your chest off of the floor while you keep your hips pressed to the floor. Keep the back of your head in  line with the curve in your back. Your eyes should be looking at the floor. 4. Hold this position for 3-5 seconds. 5. Slowly return to your starting position. Contact a health care provider if:  Your back pain or discomfort  gets much worse when you do an exercise.  Your back pain or discomfort does not lessen within 2 hours after you exercise. If you have any of these problems, stop doing these exercises right away. Do not do them again unless your health care provider says that you can. Get help right away if:  You develop sudden, severe back pain. If this happens, stop doing the exercises right away. Do not do them again unless your health care provider says that you can. This information is not intended to replace advice given to you by your health care provider. Make sure you discuss any questions you have with your health care provider. Document Released: 05/09/2004 Document Revised: 08/05/2017 Document Reviewed: 05/26/2014 Elsevier Interactive Patient Education  [employer].

## 2018-08-31 NOTE — Assessment & Plan Note (Signed)
Umbilical hernia with bowel entrapment without gangrene or obstruction  Referral to general surgery was made

## 2018-08-31 NOTE — Assessment & Plan Note (Signed)
Gastroesophageal reflux disease and for this we will continue proton pump inhibitor daily

## 2018-08-31 NOTE — Assessment & Plan Note (Signed)
This subcutaneous mass is been present for many years and I referred the patient to general surgery for evaluation

## 2018-09-10 ENCOUNTER — Other Ambulatory Visit: Payer: Self-pay | Admitting: Critical Care Medicine

## 2018-09-10 DIAGNOSIS — I10 Essential (primary) hypertension: Secondary | ICD-10-CM

## 2018-09-11 ENCOUNTER — Other Ambulatory Visit: Payer: Self-pay

## 2018-09-11 ENCOUNTER — Ambulatory Visit: Payer: BC Managed Care – PPO | Admitting: Podiatry

## 2018-09-11 VITALS — Temp 97.3°F

## 2018-09-11 DIAGNOSIS — E1151 Type 2 diabetes mellitus with diabetic peripheral angiopathy without gangrene: Secondary | ICD-10-CM | POA: Diagnosis not present

## 2018-09-11 DIAGNOSIS — E1169 Type 2 diabetes mellitus with other specified complication: Secondary | ICD-10-CM | POA: Diagnosis not present

## 2018-09-11 DIAGNOSIS — B351 Tinea unguium: Secondary | ICD-10-CM

## 2018-09-11 DIAGNOSIS — L84 Corns and callosities: Secondary | ICD-10-CM

## 2018-09-11 NOTE — Progress Notes (Signed)
Subjective:  Patient ID: Justin Houston, male    DOB: [birth year],  MRN: [id number]  Chief Complaint  Patient presents with  . Callouses    Left lateral callous trim  . Nail Problem    Pt states bilateral nails have fungus and would like to discuss anti fungal medication  . Nail Problem    Nail trim 1-5 bilateral    53 y.o. male presents  for diabetic foot care. Last AMBS was unknown. Denies numbness and tingling in their feet. Denies cramping in legs and thighs.  Review of Systems: Negative except as noted in the HPI. Denies N/V/F/Ch.  Past Medical History:  Diagnosis Date  . Dermatitis   . Diabetes mellitus   . GERD (gastroesophageal reflux disease)   . Hyperlipidemia   . Hypertension   . Lipoma of scalp   . Obesity     Current Outpatient Medications:  .  ALPHAGAN P 0.1 % SOLN, Place 1 drop into both eyes 2 (two) times daily., Disp: 15 mL, Rfl: 1 .  amLODipine (NORVASC) 10 MG tablet, Take 1 tablet (10 mg total) by mouth daily., Disp: 30 tablet, Rfl: 1 .  atenolol (TENORMIN) 50 MG tablet, Take 1 tablet (50 mg total) by mouth daily., Disp: 30 tablet, Rfl: 3 .  atorvastatin (LIPITOR) 40 MG tablet, Take 1 tablet (40 mg total) by mouth daily., Disp: 30 tablet, Rfl: 3 .  B-D INS SYR ULTRAFINE 1CC/31G 31G X 5/16" 1 ML MISC, USE AS DIRECTED, Disp: 100 each, Rfl: 0 .  chlorthalidone (HYGROTON) 25 MG tablet, Take 1 tablet (25 mg total) by mouth daily., Disp: 30 tablet, Rfl: 3 .  clobetasol ointment (TEMOVATE) 4.62 %, Apply 1 application topically 3 (three) times daily as needed (rash.)., Disp: 60 g, Rfl: 1 .  colchicine 0.6 MG tablet, Take 1 tablet (0.6 mg total) by mouth 2 (two) times daily as needed., Disp: 20 tablet, Rfl: 1 .  dapagliflozin propanediol (FARXIGA) 5 MG TABS tablet, Take 5 mg by mouth daily., Disp: 30 tablet, Rfl: 3 .  glucose blood (CONTOUR NEXT TEST) test strip, 1 each by Other route 2 (two) times daily. And lancets 2/day, Disp: 100 each, Rfl: 12 .  hydrALAZINE  (APRESOLINE) 25 MG tablet, Take 1 tablet (25 mg total) by mouth 3 (three) times daily., Disp: 90 tablet, Rfl: 1 .  insulin detemir (LEVEMIR) 100 UNIT/ML injection, INJECT 0.8 MLS (80 UNITS TOTAL) EVERY MORNING INTO THE SKIN, Disp: 20 mL, Rfl: 1 .  Insulin Pen Needle 30G X 5 MM MISC, 1 Stick by Does not apply route daily., Disp: 100 each, Rfl: 1 .  metFORMIN (GLUCOPHAGE) 1000 MG tablet, Take 1 tablet (1,000 mg total) by mouth 2 (two) times daily with a meal., Disp: 60 tablet, Rfl: 3 .  naproxen sodium (ALEVE) 220 MG tablet, Take 2 tablets (440 mg total) by mouth 2 (two) times daily as needed (pain)., Disp: 180 tablet, Rfl: 0 .  omeprazole (PRILOSEC) 20 MG capsule, Take 1 capsule (20 mg total) by mouth daily., Disp: 30 capsule, Rfl: 3 .  senna-docusate (PERI-COLACE) 8.6-50 MG tablet, Take 1 tablet by mouth daily., Disp: 30 tablet, Rfl: 2  Social History   Tobacco Use  Smoking Status Former Smoker  . Last attempt to quit: 09/05/1994  . Years since quitting: 24.0  Smokeless Tobacco Never Used    Allergies  Allergen Reactions  . Cozaar [Losartan Potassium]     Hives   . Losartan Potassium-Hctz Hives   Objective:  Vitals:   09/11/18 1016  Temp: (!) 97.3 F (36.3 C)   There is no height or weight on file to calculate BMI. Constitutional Well developed. Well nourished.  Vascular Dorsalis pedis pulses present 1+ bilaterally  Posterior tibial pulses absent bilaterally  Pedal hair growth diminished. Capillary refill normal to all digits.  No cyanosis or clubbing noted.  Neurologic Normal speech. Oriented to person, place, and time. Epicritic sensation to light touch grossly present bilaterally. Protective sensation with 5.07 monofilament  present bilaterally. Vibratory sensation present bilaterally.  Dermatologic Nails elongated, thickened, dystrophic. No open wounds. HPK lateral 5th Met base  Orthopedic: Normal joint ROM without pain or crepitus bilaterally. No visible  deformities. No bony tenderness.   Assessment:   1. Onychomycosis of multiple toenails with type 2 diabetes mellitus and peripheral angiopathy (HCC)   2. Callus of foot    Plan:  Patient was evaluated and treated and all questions answered.  Diabetes with PAD, Onychomycosis -Educated on diabetic footcare. Diabetic risk level 0 -Nails x10 debrided sharply and manually with large nail nipper and rotary burr.  -Discussed avoiding systemic antifungal medication due to DM  Procedure: Nail Debridement Rationale: Patient meets criteria for routine foot care due to PAD Type of Debridement: manual, sharp debridement. Instrumentation: Nail nipper, rotary burr. Number of Nails: 10   Procedure: Paring of Lesion Rationale: painful hyperkeratotic lesion Type of Debridement: manual, sharp debridement. Instrumentation: 312 blade Number of Lesions: 1   No follow-ups on file.

## 2018-10-01 ENCOUNTER — Other Ambulatory Visit: Payer: Self-pay | Admitting: Critical Care Medicine

## 2018-10-01 ENCOUNTER — Telehealth: Payer: Self-pay | Admitting: Physician Assistant

## 2018-10-01 DIAGNOSIS — I10 Essential (primary) hypertension: Secondary | ICD-10-CM

## 2018-10-01 NOTE — Telephone Encounter (Signed)
Pt has been complaining about high blood sugar he would like to know if he should increase insulin..please follow up

## 2018-10-02 NOTE — Telephone Encounter (Signed)
Attempted to reach patient - no answer. Left VM with instructions to give me a call back. I reviewed Dr. Bettina Gavia note from his encounter last month and it appears pt was instructed to schedule with me but never did so.

## 2018-10-07 ENCOUNTER — Ambulatory Visit: Payer: BC Managed Care – PPO | Attending: Family Medicine | Admitting: Pharmacist

## 2018-10-07 ENCOUNTER — Encounter: Payer: Self-pay | Admitting: Pharmacist

## 2018-10-07 ENCOUNTER — Other Ambulatory Visit: Payer: Self-pay

## 2018-10-07 DIAGNOSIS — E1169 Type 2 diabetes mellitus with other specified complication: Secondary | ICD-10-CM

## 2018-10-07 DIAGNOSIS — Z6841 Body Mass Index (BMI) 40.0 and over, adult: Secondary | ICD-10-CM | POA: Diagnosis not present

## 2018-10-07 DIAGNOSIS — E669 Obesity, unspecified: Secondary | ICD-10-CM

## 2018-10-07 LAB — GLUCOSE, POCT (MANUAL RESULT ENTRY): POC Glucose: 143 mg/dl — AB (ref 70–99)

## 2018-10-07 MED ORDER — TRULICITY 0.75 MG/0.5ML ~~LOC~~ SOAJ: 0.7500 mg | SUBCUTANEOUS | 0 refills | Status: DC

## 2018-10-07 NOTE — Progress Notes (Signed)
S:    PCP: Dr. Joya Gaskins   No chief complaint on file.  Patient arrives in good spirits.  Presents for diabetes evaluation, education, and management at the request of Dr. Joya Gaskins. Patient was referred on 08/31/18.  Of note, pt called last wk after being switched to Levemir pens and was found to be injecting 8 instead of 80 units as rx.   Patient reports diabetes was diagnosed in 1997.   Family/Social History:  - DM in [place]  - Former smoker (quit in 1996) - Alcohol: denies   Human resources officer affordability: [employer]  Patient reports adherence with medications.  Current diabetes medications include: metformin 1g BID, Farxiga 5 mg daily, and Levemir 80 units daily (using 40 BID) Current hypertension medications include: chlorthalidone 25 mg daily, amlodipine  Current hyperlipidemia medications include: atorvastatin 40 mg daily  Patient denies hypoglycemic events.  Patient reported dietary habits:  - Switched white bread, pasta to wheat; switched from white to brown rice - Admits to desserts occasionally  - Drinks diet or sugar-free sodas; unsweetened tea  Patient-reported exercise habits:  - Walks ~26mins to 1 hour/3x weekly   Patient reports improvement in polyuria, polydipsia.  Patient denies neuropathy. Patient reports visual changes when blood sugar is high. Patient reports self foot exams.    O:  POCT glucose: 143  Home fasting CBG: 130 - 220  Before bedtime: 250 - 270 (1 outlier 367 last night)  Lab Results  Component Value Date   HGBA1C 9.6 (A) 08/31/2018   There were no vitals filed for this visit.  Lipid Panel     Component Value Date/Time   CHOL 129 07/12/2017 1023   TRIG 166 (H) 07/12/2017 1023   HDL 35 (L) 07/12/2017 1023   CHOLHDL 3.7 07/12/2017 1023   CHOLHDL 3.8 01/16/2016 1619   VLDL 23 01/16/2016 1619   LDLCALC 61 07/12/2017 1023   Clinical ASCVD: No  The 10-year ASCVD risk score Mikey Bussing DC Jr., et al., 2013) is: 31.6%   Values used to  calculate the score:     Age: 34 years     Sex: Male     Is Non-Hispanic African American: Yes     Diabetic: Yes     Tobacco smoker: No     Systolic Blood Pressure: 951 mmHg     Is BP treated: Yes     HDL Cholesterol: 35 mg/dL     Total Cholesterol: 159 mg/dL   A/P: Diabetes longstanding currently uncontrolled. Patient is able to verbalize appropriate hypoglycemia management plan. Patient is adherent with medication.   Long duration of disease - pt most likely will need prandial insulin in addition to his basal insulin. However, he is not checking before or after meals during the day. He does not wish to add 2-3 daily RA insulin injections. He is interested in a weekly injection. Trulicity may offer some post-prandial coverage but it is likely that this patient is not making enough of his own insulin for Trulicity to benefit him. However, we will try this. No hx of thyroid cancer or pancreatitis.   He verbalizes understanding that we will most likely have to add RA insulin before meals at next appointment. Pt encouraged to check blood sugar at various times during the day to provide more data concerning home glycemic control.   -Add Trulicity once weekly -Continue Levemir 40 units BID, metformin and Farxiga -Extensively discussed pathophysiology of DM, recommended lifestyle interventions, dietary effects on glycemic control -Counseled on s/sx of  and management of hypoglycemia -Next A1C anticipated 12/01/18.  -Microalb:SCr -CMP  ASCVD risk - primary prevention in patient with DM. Last LDL is controlled. ASCVD risk score is >20%  - high intensity statin indicated.  -Continued atorvastatin 40 mg.  -Lipid  HM: UTD on vaccines.  Written patient instructions provided. Total time in face to face counseling 30 minutes.   Follow up PCP Clinic Visit 11/02/18.     Patient seen with: Jonathon Bellows PharmD Candidate [phone] [place], PharmD, [place] [phone]

## 2018-10-07 NOTE — Patient Instructions (Signed)
Thank you for coming to see me today. Please do the following:  1. Start Trulicity 8.89 mg weekly. Inject on Thursdays.  2. Continue Levemir 40 units BID.  3. Continue metformin and farxiga daily.  4. Continue checking blood sugars at home. Start checking 2 hours after meals during the day. 5. Continue making the lifestyle changes we've discussed together during our visit. Diet and exercise play a significant role in improving your blood sugars.  6. Follow-up with Dr. Joya Gaskins next month.   Hypoglycemia or low blood sugar:   Low blood sugar can happen quickly and may become an emergency if not treated right away.   While this shouldn't happen often, it can be brought upon if you skip a meal or do not eat enough. Also, if your insulin or other diabetes medications are dosed too high, this can cause your blood sugar to go to low.   Warning signs of low blood sugar include: 1. Feeling shaky or dizzy 2. Feeling weak or tired  3. Excessive hunger 4. Feeling anxious or upset  5. Sweating even when you aren't exercising  What to do if I experience low blood sugar? 1. Check your blood sugar with your meter. If lower than 70, proceed to step 2.  2. Treat with 3-4 glucose tablets or 3 packets of regular sugar. If these aren't around, you can try hard candy. Yet another option would be to drink 4 ounces of fruit juice or 6 ounces of REGULAR soda.  3. Re-check your sugar in 15 minutes. If it is still below 70, do what you did in step 2 again. If has come back up, go ahead and eat a snack or small meal at this time.

## 2018-10-08 LAB — CMP14+EGFR
ALT: 21 IU/L (ref 0–44)
AST: 18 IU/L (ref 0–40)
Albumin/Globulin Ratio: 1.3 (ref 1.2–2.2)
Albumin: 4 g/dL (ref 3.8–4.9)
Alkaline Phosphatase: 95 IU/L (ref 39–117)
BUN/Creatinine Ratio: 11 (ref 9–20)
BUN: 12 mg/dL (ref 6–24)
Bilirubin Total: 1.1 mg/dL (ref 0.0–1.2)
CO2: 21 mmol/L (ref 20–29)
Calcium: 9.6 mg/dL (ref 8.7–10.2)
Chloride: 101 mmol/L (ref 96–106)
Creatinine, Ser: 1.08 mg/dL (ref 0.76–1.27)
GFR calc Af Amer: 89 mL/min/{1.73_m2} (ref >59)
GFR calc non Af Amer: 77 mL/min/{1.73_m2} (ref >59)
Globulin, Total: 3 g/dL (ref 1.5–4.5)
Glucose: 159 mg/dL — ABNORMAL HIGH (ref 65–99)
Potassium: 3.4 mmol/L — ABNORMAL LOW (ref 3.5–5.2)
Sodium: 144 mmol/L (ref 134–144)
Total Protein: 7 g/dL (ref 6.0–8.5)

## 2018-10-08 LAB — MICROALBUMIN / CREATININE URINE RATIO
Creatinine, Urine: 121.4 mg/dL
Microalb/Creat Ratio: 59 mg/g creat — ABNORMAL HIGH (ref 0–29)
Microalbumin, Urine: 71.7 ug/mL

## 2018-10-08 LAB — LIPID PANEL
Chol/HDL Ratio: 3.9 ratio (ref 0.0–5.0)
Cholesterol, Total: 125 mg/dL (ref 100–199)
HDL: 32 mg/dL — ABNORMAL LOW (ref >39)
LDL Calculated: 71 mg/dL (ref 0–99)
Triglycerides: 109 mg/dL (ref 0–149)
VLDL Cholesterol Cal: 22 mg/dL (ref 5–40)

## 2018-10-09 NOTE — Telephone Encounter (Signed)
Patient contacted via phone to be given results of labs.  Patient identified by name and date of birth.  Patient given results of labs.  Patient educated on lab results. Questions answered. Patient acknowledged understanding of labs results. 

## 2018-10-12 ENCOUNTER — Other Ambulatory Visit: Payer: Self-pay | Admitting: Pharmacist

## 2018-10-12 DIAGNOSIS — I10 Essential (primary) hypertension: Secondary | ICD-10-CM

## 2018-10-12 MED ORDER — CHLORTHALIDONE 25 MG PO TABS: 25.0000 mg | ORAL_TABLET | Freq: Every day | ORAL | 3 refills | Status: DC

## 2018-10-12 NOTE — Telephone Encounter (Signed)
Patient's pharmacy is requesting refills of chlorthalidone. K borderline low at 3.4 from 10/07/18 labs. Will route to PCP for approval.

## 2018-10-13 ENCOUNTER — Other Ambulatory Visit: Payer: Self-pay | Admitting: Critical Care Medicine

## 2018-10-13 DIAGNOSIS — E1169 Type 2 diabetes mellitus with other specified complication: Secondary | ICD-10-CM

## 2018-10-28 ENCOUNTER — Other Ambulatory Visit: Payer: Self-pay | Admitting: Critical Care Medicine

## 2018-10-28 DIAGNOSIS — I10 Essential (primary) hypertension: Secondary | ICD-10-CM

## 2018-10-31 NOTE — Progress Notes (Signed)
Subjective:    Patient ID: Justin Houston, male    DOB: 1965/12/22, 53 y.o.   MRN: [id number]  This is a 53 year old male seen in f/u for T2DM, back pain, GERD, varicose veins.   The patient has been seen by clinical pharmacist with medication adjustments.  The pt was referred to podiatry and was seen.   Gen Surgery made but not yet occurred for umbilical hernia.  No dyspnea, pt is moving more.  CBGs much improved 90-100 fasting up to 120   Wt Readings from Last 3 Encounters: 11/02/18 : (!) 318 lb (144.2 kg) 08/31/18 : (!) 316 lb (143.3 kg) 11/28/17 : (!) 326 lb (147.9 kg)   Past Medical History:  Diagnosis Date  . Dermatitis   . Diabetes mellitus   . GERD (gastroesophageal reflux disease)   . Hyperlipidemia   . Hypertension   . Lipoma of scalp   . Obesity      Family History  Problem Relation Age of Onset  . Stroke Mother   . Hypertension Mother   . Diabetes Maternal Grandmother   . Prostate cancer Maternal Grandfather   . Diabetes Other      Social History   Socioeconomic History  . Marital status: Single    Spouse name: n/a  . Number of children: 0  . Years of education: 55  . Highest education level: Not on file  Occupational History  . Occupation: Software engineer: [employer]  Social Needs  . Financial resource strain: Not on file  . Food insecurity    Worry: Not on file    Inability: Not on file  . Transportation needs    Medical: Not on file    Non-medical: Not on file  Tobacco Use  . Smoking status: Former Smoker    Quit date: 09/05/1994    Years since quitting: 24.1  . Smokeless tobacco: Never Used  Substance and Sexual Activity  . Alcohol use: No    Alcohol/week: 0.0 standard drinks  . Drug use: No  . Sexual activity: Never  Lifestyle  . Physical activity    Days per week: Not on file    Minutes per session: Not on file  . Stress: Not on file  Relationships  . Social Herbalist on phone: Not  on file    Gets together: Not on file    Attends religious service: Not on file    Active member of club or organization: Not on file    Attends meetings of clubs or organizations: Not on file    Relationship status: Not on file  . Intimate partner violence    Fear of current or ex partner: Not on file    Emotionally abused: Not on file    Physically abused: Not on file    Forced sexual activity: Not on file  Other Topics Concern  . Not on file  Social History Narrative   Master's Degree in Adult Education.  Lives alone.   No siblings.     Allergies  Allergen Reactions  . Cozaar [Losartan Potassium]     Hives   . Losartan Potassium-Hctz Hives     Outpatient Medications Prior to Visit  Medication Sig Dispense Refill  . ALPHAGAN P 0.1 % SOLN Place 1 drop into both eyes 2 (two) times daily. 15 mL 1  . amLODipine (NORVASC) 10 MG tablet Take 1 tablet (10 mg total) by mouth daily. 30 tablet 1  .  atenolol (TENORMIN) 50 MG tablet Take 1 tablet (50 mg total) by mouth daily. 30 tablet 3  . atorvastatin (LIPITOR) 40 MG tablet Take 1 tablet (40 mg total) by mouth daily. 30 tablet 3  . B-D INS SYR ULTRAFINE 1CC/31G 31G X 5/16" 1 ML MISC USE AS DIRECTED 100 each 0  . chlorthalidone (HYGROTON) 25 MG tablet Take 1 tablet (25 mg total) by mouth daily. 30 tablet 3  . clobetasol ointment (TEMOVATE) 4.25 % Apply 1 application topically 3 (three) times daily as needed (rash.). 60 g 1  . colchicine 0.6 MG tablet Take 1 tablet (0.6 mg total) by mouth 2 (two) times daily as needed. 20 tablet 1  . dapagliflozin propanediol (FARXIGA) 5 MG TABS tablet Take 5 mg by mouth daily. 30 tablet 3  . hydrALAZINE (APRESOLINE) 25 MG tablet TAKE 1 TABLET BY MOUTH 3 TIMES DAILY 90 tablet 1  . Insulin Pen Needle 30G X 5 MM MISC 1 Stick by Does not apply route daily. 100 each 1  . metFORMIN (GLUCOPHAGE) 1000 MG tablet Take 1 tablet (1,000 mg total) by mouth 2 (two) times daily with a meal. 60 tablet 3  . naproxen  sodium (ALEVE) 220 MG tablet Take 2 tablets (440 mg total) by mouth 2 (two) times daily as needed (pain). 180 tablet 0  . omeprazole (PRILOSEC) 20 MG capsule Take 1 capsule (20 mg total) by mouth daily. 30 capsule 3  . SENNA PLUS 8.6-50 MG tablet Take 1 tablet by mouth daily. 30 tablet 2  . Dulaglutide (TRULICITY) 9.56 LO/7.5IE SOPN Inject 0.75 mg into the skin once a week. 4 pen 0  . glucose blood (CONTOUR NEXT TEST) test strip 1 each by Other route 2 (two) times daily. And lancets 2/day 100 each 12  . insulin detemir (LEVEMIR) 100 UNIT/ML injection INJECT 0.8 MLS (80 UNITS TOTAL) EVERY MORNING INTO THE SKIN (Patient taking differently: INJECT 0.4 ML in AM and PM) 20 mL 1  . B-D UF III MINI PEN NEEDLES 31G X 5 MM MISC USE 1 DAILY     No facility-administered medications prior to visit.    Review of Systems  Constitutional: Negative.  Negative for fever.  HENT: Negative.   Eyes: Positive for visual disturbance.  Respiratory: Negative for cough, choking, chest tightness, shortness of breath and wheezing.   Cardiovascular: Positive for leg swelling. Negative for chest pain and palpitations.       Varicose veins  Gastrointestinal: Positive for constipation. Negative for abdominal distention, abdominal pain, anal bleeding, blood in stool, diarrhea, nausea, rectal pain and vomiting.  Endocrine: Negative.   Genitourinary: Negative.   Skin: Negative for rash.  Neurological: Negative.   Hematological: Negative.   Psychiatric/Behavioral: Negative.        Objective:   Physical Exam Vitals:   11/02/18 0923  BP: (!) 145/88  Pulse: 76  Resp: 16  Temp: 98.2 F (36.8 C)  TempSrc: Oral  SpO2: 99%  Weight: (!) 318 lb (144.2 kg)   Wt Readings from Last 3 Encounters:  11/02/18 (!) 318 lb (144.2 kg)  08/31/18 (!) 316 lb (143.3 kg)  11/28/17 (!) 326 lb (147.9 kg)   Gen: Pleasant, obese, in no distress,  normal affect  ENT: No lesions,  mouth clear,  oropharynx clear, no postnasal drip,  lipomatous lesion R posterior skull  Neck: No JVD, no TMG, no carotid bruits  Lungs: No use of accessory muscles, no dullness to percussion, clear without rales or rhonchi  Cardiovascular: RRR, heart  sounds normal, no murmur or gallops, no peripheral edema, severe varicose veins  Abdomen: soft and NT, no HSM,  BS normal, umbilical hernia with part of bowel in hernia, this is reduceable  Musculoskeletal: No deformities, no cyanosis or clubbing   Neuro: alert, non focal  Skin: Warm, no lesions or rashes  BMP Latest Ref Rng & Units 10/07/2018 07/12/2017 02/11/2017  Glucose 65 - 99 mg/dL 159(H) 220(H) 298(H)  BUN 6 - 24 mg/dL 12 13 16   Creatinine 0.76 - 1.27 mg/dL 1.08 0.98 1.17  BUN/Creat Ratio 9 - 20 11 13 14   Sodium 134 - 144 mmol/L 144 138 137  Potassium 3.5 - 5.2 mmol/L 3.4(L) 3.9 4.0  Chloride 96 - 106 mmol/L 101 100 95(L)  CO2 20 - 29 mmol/L 21 24 26   Calcium 8.7 - 10.2 mg/dL 9.6 8.9 9.8   Hepatic Function Panel     Component Value Date/Time   PROT 7.0 10/07/2018 1146   ALBUMIN 4.0 10/07/2018 1146   AST 18 10/07/2018 1146   ALT 21 10/07/2018 1146   ALKPHOS 95 10/07/2018 1146   BILITOT 1.1 10/07/2018 1146   CBC Latest Ref Rng & Units 07/12/2017 02/11/2017 08/16/2016  WBC 3.4 - 10.8 x10E3/uL 5.1 5.9 5.7  Hemoglobin 13.0 - 17.7 g/dL 15.4 16.2 15.2  Hematocrit 37.5 - 51.0 % 45.9 47.5 45.4  Platelets 150 - 379 x10E3/uL 179 198 184       Assessment & Plan:  I personally reviewed all images and lab data in the [facility] system as well as any outside material available during this office visit and agree with the  radiology impressions.   Hypertension Hypertension with improved control compared to last visit We will continue the amlodipine 10 mg daily, atenolol 50 mg daily, chlorthalidone 25 mg daily    Varicose veins of both legs with edema History of varicose veins in lower extremities with some discomfort  Plan will be to refer to vascular surgery for evaluation   Diabetes mellitus type 2 with retinopathy ([place]) Diabetes type 2 with associated retinopathy and hyperlipidemia  Note recent lipid panel showed improved control of lipids  Blood sugars markedly improved with dosing of Levemir, Glucophage, Trulicity, and [place] daily  We will continue current program for now  Toenail fungus Toenail fungus is improved on exam and appreciate consultation by podiatry  BMI 45.0-49.9, adult ([place]) Weight has significantly improved with BMI now down to 44  Diabetic retinopathy The patient does have a follow-up visit with ophthalmology with regards to his nonproliferative without macular edema retinopathy  Mass of head, right postauricular SQ 7x4cm Mass on the right head postauricular looks to be a lipoma  We will have general surgery evaluate  Umbilical hernia without obstruction and without gangrene Umbilical hernia without gangrene and is reducible  Again will refer to general surgery   Ellijah was seen today for diabetes.  Diagnoses and all orders for this visit:  Diabetes mellitus type 2 in obese ([place]) -     POCT glucose (manual entry) -     insulin detemir (LEVEMIR) 100 UNIT/ML injection; INJECT 0.4 ML in AM and PM -     Dulaglutide (TRULICITY) 4.76 LY/6.5KP SOPN; Inject 0.75 mg into the skin once a week. -     glucose blood (CONTOUR NEXT TEST) test strip; 1 each by Other route 2 (two) times daily. And lancets 2/day  Asymptomatic varicose veins of both lower extremities -     Ambulatory referral to Vascular Surgery  Essential hypertension  Type 2 diabetes mellitus with retinopathy, with long-term current use of insulin, macular edema presence unspecified, unspecified laterality, unspecified retinopathy severity ([facility])  Hyperlipidemia, unspecified hyperlipidemia type  BMI 45.0-49.9, adult ([facility])  Moderate nonproliferative diabetic retinopathy without macular edema associated with type 2 diabetes mellitus, unspecified laterality ([place])   Umbilical hernia without obstruction and without gangrene  Varicose veins of both legs with edema  Lipoma of other specified sites  Toenail fungus  Mass of head, right postauricular SQ 7x4cm

## 2018-11-02 ENCOUNTER — Other Ambulatory Visit: Payer: Self-pay

## 2018-11-02 ENCOUNTER — Encounter: Payer: Self-pay | Admitting: Critical Care Medicine

## 2018-11-02 ENCOUNTER — Ambulatory Visit: Payer: BC Managed Care – PPO | Attending: Critical Care Medicine | Admitting: Critical Care Medicine

## 2018-11-02 ENCOUNTER — Telehealth: Payer: Self-pay | Admitting: Critical Care Medicine

## 2018-11-02 VITALS — BP 145/88 | HR 76 | Temp 98.2°F | Resp 16 | Wt 318.0 lb

## 2018-11-02 DIAGNOSIS — I8393 Asymptomatic varicose veins of bilateral lower extremities: Secondary | ICD-10-CM

## 2018-11-02 DIAGNOSIS — E785 Hyperlipidemia, unspecified: Secondary | ICD-10-CM

## 2018-11-02 DIAGNOSIS — I83893 Varicose veins of bilateral lower extremities with other complications: Secondary | ICD-10-CM

## 2018-11-02 DIAGNOSIS — E1169 Type 2 diabetes mellitus with other specified complication: Secondary | ICD-10-CM

## 2018-11-02 DIAGNOSIS — B351 Tinea unguium: Secondary | ICD-10-CM

## 2018-11-02 DIAGNOSIS — E11319 Type 2 diabetes mellitus with unspecified diabetic retinopathy without macular edema: Secondary | ICD-10-CM | POA: Diagnosis not present

## 2018-11-02 DIAGNOSIS — I10 Essential (primary) hypertension: Secondary | ICD-10-CM

## 2018-11-02 DIAGNOSIS — K429 Umbilical hernia without obstruction or gangrene: Secondary | ICD-10-CM

## 2018-11-02 DIAGNOSIS — E669 Obesity, unspecified: Secondary | ICD-10-CM

## 2018-11-02 DIAGNOSIS — E113399 Type 2 diabetes mellitus with moderate nonproliferative diabetic retinopathy without macular edema, unspecified eye: Secondary | ICD-10-CM

## 2018-11-02 DIAGNOSIS — R220 Localized swelling, mass and lump, head: Secondary | ICD-10-CM

## 2018-11-02 DIAGNOSIS — Z6841 Body Mass Index (BMI) 40.0 and over, adult: Secondary | ICD-10-CM

## 2018-11-02 DIAGNOSIS — Z794 Long term (current) use of insulin: Secondary | ICD-10-CM

## 2018-11-02 DIAGNOSIS — D1779 Benign lipomatous neoplasm of other sites: Secondary | ICD-10-CM

## 2018-11-02 LAB — GLUCOSE, POCT (MANUAL RESULT ENTRY): POC Glucose: 94 mg/dl (ref 70–99)

## 2018-11-02 MED ORDER — CONTOUR NEXT TEST VI STRP: 1.0000 | ORAL_STRIP | Freq: Two times a day (BID) | 12 refills | Status: DC

## 2018-11-02 MED ORDER — INSULIN DETEMIR 100 UNIT/ML ~~LOC~~ SOLN: SUBCUTANEOUS | 1 refills | Status: DC

## 2018-11-02 MED ORDER — TRULICITY 0.75 MG/0.5ML ~~LOC~~ SOAJ: 0.7500 mg | SUBCUTANEOUS | 0 refills | Status: DC

## 2018-11-02 NOTE — Assessment & Plan Note (Signed)
Hypertension with improved control compared to last visit We will continue the amlodipine 10 mg daily, atenolol 50 mg daily, chlorthalidone 25 mg daily

## 2018-11-02 NOTE — Patient Instructions (Addendum)
Call for appt to evaluate hernia: [facility] [place] [address]  Ph.# [phone]  Fax # [phone]    [facility]ills on Trulicity and [facility]  A referral to vascular surgery made for your varicose veins  No change in your medications  Return to see Dr. Joya Gaskins 4 months

## 2018-11-02 NOTE — Telephone Encounter (Signed)
Pt needs patient assistance for obtaining Trulicity.  I rerouted Rx to our pharmacy,  Justin Houston, please assist pt and work with Lurena Joiner to obtain the Trulicity

## 2018-11-02 NOTE — Telephone Encounter (Signed)
Patient called stating that the medication he was prescribed is out of his reach financially including the test strips and is unsure on what to do. Please follow up.

## 2018-11-02 NOTE — Assessment & Plan Note (Signed)
Weight has significantly improved with BMI now down to 44

## 2018-11-02 NOTE — Assessment & Plan Note (Signed)
Umbilical hernia without gangrene and is reducible  Again will refer to general surgery

## 2018-11-02 NOTE — Assessment & Plan Note (Signed)
The patient does have a follow-up visit with ophthalmology with regards to his nonproliferative without macular edema retinopathy

## 2018-11-02 NOTE — Assessment & Plan Note (Signed)
Mass on the right head postauricular looks to be a lipoma  We will have general surgery evaluate

## 2018-11-02 NOTE — Assessment & Plan Note (Signed)
Diabetes type 2 with associated retinopathy and hyperlipidemia  Note recent lipid panel showed improved control of lipids  Blood sugars markedly improved with dosing of Levemir, Glucophage, Trulicity, and [place] daily  We will continue current program for now

## 2018-11-02 NOTE — Assessment & Plan Note (Signed)
Toenail fungus is improved on exam and appreciate consultation by podiatry

## 2018-11-02 NOTE — Assessment & Plan Note (Signed)
History of varicose veins in lower extremities with some discomfort  Plan will be to refer to vascular surgery for evaluation

## 2018-11-03 ENCOUNTER — Other Ambulatory Visit: Payer: Self-pay | Admitting: Pharmacist

## 2018-11-03 DIAGNOSIS — E11319 Type 2 diabetes mellitus with unspecified diabetic retinopathy without macular edema: Secondary | ICD-10-CM

## 2018-11-03 MED ORDER — ONETOUCH VERIO VI STRP: ORAL_STRIP | 11 refills | Status: DC

## 2018-11-03 MED ORDER — ONETOUCH DELICA LANCETS 33G MISC: 11 refills | Status: DC

## 2018-11-03 MED ORDER — ONETOUCH VERIO W/DEVICE KIT: PACK | 0 refills | Status: DC

## 2018-11-03 MED FILL — TRULICITY 0.75 MG/0.5 ML PE: 0.75 | 28 days supply | Qty: 2 | Fill #0

## 2018-11-03 NOTE — Telephone Encounter (Signed)
Will route to [facility]

## 2018-11-03 NOTE — Telephone Encounter (Signed)
Pt cannot afford Trulicity copay. He cannot receive manufacturer MAP with his insurance.   Spoke to pt. He reports that he should soon be able to return to work. He has been unemployeed due to the  covid-19 pandemic. 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 Offered to fill the rx here - pt can use a charge account up to a maximum of $100 with his insurance. He does understand that he will need to pay what he can now and  the remaining amt later, and that this option should be limited to the short term.

## 2018-11-10 MED FILL — CHLORTHALIDONE 25 MG TAB: 25 | 30 days supply | Qty: 30 | Fill #0

## 2018-11-10 MED FILL — ALPHAGAN P 0.1% DROPS: 0.1 | 18 days supply | Qty: 5 | Fill #0

## 2018-11-10 MED FILL — OMEPRAZOLE 20 MG CAP: 20 | 30 days supply | Qty: 30 | Fill #0

## 2018-11-10 MED FILL — LEVEMIR 100 UNITS/ML VIAL: 100 | 25 days supply | Qty: 20 | Fill #0

## 2018-11-10 MED FILL — ATENOLOL 50 MG TABLET: 50 | 30 days supply | Qty: 30 | Fill #0

## 2018-11-11 MED FILL — ATORVASTATIN CALCIUM 40 MG: 40 | 30 days supply | Qty: 30 | Fill #0

## 2018-11-16 ENCOUNTER — Telehealth: Payer: Self-pay | Admitting: Critical Care Medicine

## 2018-11-16 ENCOUNTER — Other Ambulatory Visit (INDEPENDENT_AMBULATORY_CARE_PROVIDER_SITE_OTHER): Payer: Self-pay | Admitting: *Deleted

## 2018-11-16 DIAGNOSIS — E1169 Type 2 diabetes mellitus with other specified complication: Secondary | ICD-10-CM

## 2018-11-16 DIAGNOSIS — E669 Obesity, unspecified: Secondary | ICD-10-CM

## 2018-11-16 MED ORDER — METFORMIN HCL 1000 MG PO TABS: 1000.0000 mg | ORAL_TABLET | Freq: Two times a day (BID) | ORAL | 0 refills | Status: DC

## 2018-11-16 NOTE — Telephone Encounter (Signed)
Notice after morning exercise, blood blister last week.  He has not noticed pain over the weekend but did have pain when it happened initially.   Size of blister is smaller that a dime, pea size, on the pad of the 1st toe, staying the same size Not black, does not appear to have a scab.   Advised to call office back if he noticed inflammation, drainage, tenderness, increase in size before his appointment. Encouraged to not to walk bare foot and to keep feet clean and dry. Patient verbalized understanding. Appointment is scheduled- 11/19/2018 at 38 in [place]   [facility]

## 2018-11-16 NOTE — Telephone Encounter (Signed)
Pt would like a call back about a blister he got when walking, since he is a diabetic he would like instructions/ if any, on what to do until his appt 11/19/2018

## 2018-11-16 NOTE — Telephone Encounter (Signed)
Attempt to return patient call. Left message on voicemail.

## 2018-11-17 NOTE — Telephone Encounter (Signed)
Would suggest he does keep walk in appt tomorrow

## 2018-11-19 ENCOUNTER — Ambulatory Visit: Payer: BC Managed Care – PPO | Attending: Family Medicine | Admitting: Physician Assistant

## 2018-11-19 ENCOUNTER — Other Ambulatory Visit: Payer: Self-pay

## 2018-11-19 VITALS — BP 135/74 | HR 70 | Temp 98.0°F | Wt 317.2 lb

## 2018-11-19 DIAGNOSIS — T148XXA Other injury of unspecified body region, initial encounter: Secondary | ICD-10-CM

## 2018-11-19 DIAGNOSIS — S90424A Blister (nonthermal), right lesser toe(s), initial encounter: Secondary | ICD-10-CM | POA: Diagnosis not present

## 2018-11-19 DIAGNOSIS — E11319 Type 2 diabetes mellitus with unspecified diabetic retinopathy without macular edema: Secondary | ICD-10-CM

## 2018-11-19 DIAGNOSIS — Z794 Long term (current) use of insulin: Secondary | ICD-10-CM | POA: Diagnosis not present

## 2018-11-19 LAB — GLUCOSE, POCT (MANUAL RESULT ENTRY): POC Glucose: 124 mg/dl — AB (ref 70–99)

## 2018-11-19 NOTE — Progress Notes (Deleted)
Virtual Visit via Telephone Note  I connected with Justin Houston on 11/19/18 at 10:50 AM EDT by telephone and verified that I am speaking with the correct person using two identifiers.   I discussed the limitations, risks, security and privacy concerns of performing an evaluation and management service by telephone and the availability of in person appointments. I also discussed with the patient that there may be a patient responsible charge related to this service. The patient expressed understanding and agreed to proceed.  Patient location: My Location:  [place] office Persons on the call:    History of Present Illness:    Observations/Objective:   Assessment and Plan:   Follow Up Instructions:    I discussed the assessment and treatment plan with the patient. The patient was provided an opportunity to ask questions and all were answered. The patient agreed with the plan and demonstrated an understanding of the instructions.   The patient was advised to call back or seek an in-person evaluation if the symptoms worsen or if the condition fails to improve as anticipated.  I provided *** minutes of non-face-to-face time during this encounter.   Freeman Caldron, PA-C  Patient ID: Justin Houston, male   DOB: Sep 30, 1965, 53 y.o.   MRN: [id number]

## 2018-11-19 NOTE — Progress Notes (Signed)
Justin Houston, is a 53 y.o. male  [id number]  [id number]  DOB - June 17, 1965  Subjective:  Chief Complaint and HPI: Justin Houston is a 53 y.o. male here today to with a blister on his R 2nd toe.  He noticed it about 5 days ago after walking through his house barefooted.  Not painful.  No erythema.   Blood sugars usually under 150.  Fasting this morning was 74.     ROS:   Constitutional:  No f/c, No night sweats, No unexplained weight loss. EENT:  No vision changes, No blurry vision, No hearing changes. No mouth, throat, or ear problems.  Respiratory: No cough, No SOB Cardiac: No CP, no palpitations GI:  No abd pain, No N/V/D. GU: No Urinary s/sx Musculoskeletal: No joint pain Neuro: No headache, no dizziness, no motor weakness.  Skin: No rash Endocrine:  No polydipsia. No polyuria.  Psych: Denies SI/HI  No problems updated.  ALLERGIES: Allergies  Allergen Reactions  . Cozaar [Losartan Potassium]     Hives   . Losartan Potassium-Hctz Hives    PAST MEDICAL HISTORY: Past Medical History:  Diagnosis Date  . Dermatitis   . Diabetes mellitus   . GERD (gastroesophageal reflux disease)   . Hyperlipidemia   . Hypertension   . Lipoma of scalp   . Obesity     MEDICATIONS AT HOME: Prior to Admission medications   Medication Sig Start Date End Date Taking? Authorizing Provider  ALPHAGAN P 0.1 % SOLN Place 1 drop into both eyes 2 (two) times daily. 08/31/18  Yes [name], MD  amLODipine (NORVASC) 10 MG tablet Take 1 tablet (10 mg total) by mouth daily. 08/31/18  Yes [name], MD  atenolol (TENORMIN) 50 MG tablet Take 1 tablet (50 mg total) by mouth daily. 08/31/18  Yes [name], MD  atorvastatin (LIPITOR) 40 MG tablet Take 1 tablet (40 mg total) by mouth daily. 08/31/18  Yes [name], MD  B-D INS SYR ULTRAFINE 1CC/31G 31G X 5/16" 1 ML MISC USE AS DIRECTED 10/15/16  Yes Renato Shin, MD  B-D UF III MINI PEN NEEDLES 31G X 5 MM MISC  USE 1 DAILY 09/24/18  Yes [provider]  Blood Glucose Monitoring Suppl (ONETOUCH VERIO) w/Device KIT Use as instructed to check blood sugar 3 times daily. E11.319 Z79.4 11/03/18  Yes [name], MD  chlorthalidone (HYGROTON) 25 MG tablet Take 1 tablet (25 mg total) by mouth daily. 10/12/18  Yes [name], MD  clobetasol ointment (TEMOVATE) 6.33 % Apply 1 application topically 3 (three) times daily as needed (rash.). 07/12/17  Yes Jeffery, Chelle, PA  colchicine 0.6 MG tablet Take 1 tablet (0.6 mg total) by mouth 2 (two) times daily as needed. 08/31/18  Yes [name], MD  dapagliflozin propanediol (FARXIGA) 5 MG TABS tablet Take 5 mg by mouth daily. 08/31/18  Yes [name], MD  Dulaglutide (TRULICITY) 3.54 TG/2.5WL SOPN Inject 0.75 mg into the skin once a week. 11/02/18  Yes [name], MD  glucose blood (ONETOUCH VERIO) test strip Use as instructed to check blood sugar 3 times daily. S93.734 Z79.4 11/03/18  Yes [name], MD  hydrALAZINE (APRESOLINE) 25 MG tablet TAKE 1 TABLET BY MOUTH 3 TIMES DAILY 10/28/18  Yes [name], MD  insulin detemir (LEVEMIR) 100 UNIT/ML injection INJECT 0.4 ML in AM and PM 11/02/18  Yes [name], MD  Insulin Pen Needle 30G X 5 MM  MISC 1 Stick by Does not apply route daily. 08/31/18  Yes [name], MD  metFORMIN (GLUCOPHAGE) 1000 MG tablet Take 1 tablet (1,000 mg total) by mouth 2 (two) times daily with a meal. 11/16/18  Yes [name], MD  naproxen sodium (ALEVE) 220 MG tablet Take 2 tablets (440 mg total) by mouth 2 (two) times daily as needed (pain). 07/12/17  Yes Jeffery, Chelle, PA  omeprazole (PRILOSEC) 20 MG capsule Take 1 capsule (20 mg total) by mouth daily. 08/14/18  Yes Charlott Rakes, MD  OneTouch Delica Lancets 69S MISC Use as instructed to check blood sugar 3 times daily. E11.319 Z79.4 11/03/18  Yes [name], MD  SENNA PLUS 8.6-50 MG tablet Take 1 tablet by mouth daily. 10/28/18   Yes [name], MD     Objective:  EXAM:   Vitals:   11/19/18 1022  BP: 135/74  Pulse: 70  Temp: 98 F (36.7 C)  TempSrc: Oral  SpO2: 99%  Weight: (!) 317 lb 3.2 oz (143.9 kg)    General appearance : A&OX3. NAD. Non-toxic-appearing HEENT: Atraumatic and Normocephalic.  PERRLA. EOM intact.  Chest/Lungs:  Breathing-non-labored, Good air entry bilaterally, breath sounds normal without rales, rhonchi, or wheezing  CVS: S1 S2 regular, no murmurs, gallops, rubs  Extremities: Bilateral Lower Ext shows no edema but he has severe varicosites, both legs are warm to touch with = pulse throughout.  There is a small blood blister on the tip of his R 2nd toe.  3X47m horizontally across the distal toe(not palmar surface).  No sign of gangrene or necrosis or infection.  Capillary RF both feet toes slightly delayed but good overall.  DP pulses present 1+=B.   Neurology:  CN II-XII grossly intact, Non focal.   Psych:  TP linear. J/I WNL. Normal speech. Appropriate eye contact and affect.  Skin:  No Rash  Data Review Lab Results  Component Value Date   HGBA1C 9.6 (A) 08/31/2018   HGBA1C 9.6 (A) 11/28/2017   HGBA1C 7.8 05/26/2017     Assessment & Plan   1. Type 2 diabetes mellitus with retinopathy, with long-term current use of insulin, macular edema presence unspecified, unspecified laterality, unspecified retinopathy severity ([place] Blood sugar is good today.  Continue current regimen.  Continue with diabetic diet - Glucose (CBG)  2. Blood blister No sign of necrosis, gangrene, or infection today.  Reassurance.  wtach for changes.  Reviewed foot care.  Will recheck next week for changes.  I told him we can cancel his appt if it is looking better.     Patient have been counseled extensively about nutrition and exercise  Return in about 1 week (around 11/26/2018) for rechak toe with me.  The patient was given clear instructions to go to ER or return to medical center if symptoms  don't improve, worsen or new problems develop. The patient verbalized understanding. The patient was told to call to get lab results if they haven't heard anything in the next week.     AFreeman Caldron PA-C [facility] [place]  11/19/2018, 10:54 AM

## 2018-11-19 NOTE — Patient Instructions (Signed)
Check the area on your foot daily.     Diabetes Mellitus and Foot Care Foot care is an important part of your health, especially when you have diabetes. Diabetes may cause you to have problems because of poor blood flow (circulation) to your feet and legs, which can cause your skin to:  Become thinner and drier.  Break more easily.  Heal more slowly.  Peel and crack. You may also have nerve damage (neuropathy) in your legs and feet, causing decreased feeling in them. This means that you may not notice minor injuries to your feet that could lead to more serious problems. Noticing and addressing any potential problems early is the best way to prevent future foot problems. How to care for your feet Foot hygiene  Wash your feet daily with warm water and mild soap. Do not use hot water. Then, pat your feet and the areas between your toes until they are completely dry. Do not soak your feet as this can dry your skin.  Trim your toenails straight across. Do not dig under them or around the cuticle. File the edges of your nails with an emery board or nail file.  Apply a moisturizing lotion or petroleum jelly to the skin on your feet and to dry, brittle toenails. Use lotion that does not contain alcohol and is unscented. Do not apply lotion between your toes. Shoes and socks  Wear clean socks or stockings every day. Make sure they are not too tight. Do not wear knee-high stockings since they may decrease blood flow to your legs.  Wear shoes that fit properly and have enough cushioning. Always look in your shoes before you put them on to be sure there are no objects inside.  To break in new shoes, wear them for just a few hours a day. This prevents injuries on your feet. Wounds, scrapes, corns, and calluses  Check your feet daily for blisters, cuts, bruises, sores, and redness. If you cannot see the bottom of your feet, use a mirror or ask someone for help.  Do not cut corns or calluses or try  to remove them with medicine.  If you find a minor scrape, cut, or break in the skin on your feet, keep it and the skin around it clean and dry. You may clean these areas with mild soap and water. Do not clean the area with peroxide, alcohol, or iodine.  If you have a wound, scrape, corn, or callus on your foot, look at it several times a day to make sure it is healing and not infected. Check for: ? Redness, swelling, or pain. ? Fluid or blood. ? Warmth. ? Pus or a bad smell. General instructions  Do not cross your legs. This may decrease blood flow to your feet.  Do not use heating pads or hot water bottles on your feet. They may burn your skin. If you have lost feeling in your feet or legs, you may not know this is happening until it is too late.  Protect your feet from hot and cold by wearing shoes, such as at the beach or on hot pavement.  Schedule a complete foot exam at least once a year (annually) or more often if you have foot problems. If you have foot problems, report any cuts, sores, or bruises to your health care provider immediately. Contact a health care provider if:  You have a medical condition that increases your risk of infection and you have any cuts, sores, or bruises  on your feet.  You have an injury that is not healing.  You have redness on your legs or feet.  You feel burning or tingling in your legs or feet.  You have pain or cramps in your legs and feet.  Your legs or feet are numb.  Your feet always feel cold.  You have pain around a toenail. Get help right away if:  You have a wound, scrape, corn, or callus on your foot and: ? You have pain, swelling, or redness that gets worse. ? You have fluid or blood coming from the wound, scrape, corn, or callus. ? Your wound, scrape, corn, or callus feels warm to the touch. ? You have pus or a bad smell coming from the wound, scrape, corn, or callus. ? You have a fever. ? You have a red line going up your  leg. Summary  Check your feet every day for cuts, sores, red spots, swelling, and blisters.  Moisturize feet and legs daily.  Wear shoes that fit properly and have enough cushioning.  If you have foot problems, report any cuts, sores, or bruises to your health care provider immediately.  Schedule a complete foot exam at least once a year (annually) or more often if you have foot problems. This information is not intended to replace advice given to you by your health care provider. Make sure you discuss any questions you have with your health care provider. Document Released: 03/29/2000 Document Revised: 05/14/2017 Document Reviewed: 05/03/2016 Elsevier Patient Education  2020 [employer].

## 2018-11-25 ENCOUNTER — Ambulatory Visit: Payer: BC Managed Care – PPO | Attending: Critical Care Medicine | Admitting: Physician Assistant

## 2018-11-25 ENCOUNTER — Other Ambulatory Visit: Payer: Self-pay

## 2018-11-25 VITALS — BP 133/80 | HR 79 | Temp 98.9°F | Ht 71.0 in | Wt 319.8 lb

## 2018-11-25 DIAGNOSIS — L308 Other specified dermatitis: Secondary | ICD-10-CM

## 2018-11-25 DIAGNOSIS — S90424D Blister (nonthermal), right lesser toe(s), subsequent encounter: Secondary | ICD-10-CM

## 2018-11-25 DIAGNOSIS — E11319 Type 2 diabetes mellitus with unspecified diabetic retinopathy without macular edema: Secondary | ICD-10-CM

## 2018-11-25 DIAGNOSIS — T148XXA Other injury of unspecified body region, initial encounter: Secondary | ICD-10-CM

## 2018-11-25 DIAGNOSIS — Z794 Long term (current) use of insulin: Secondary | ICD-10-CM

## 2018-11-25 DIAGNOSIS — L309 Dermatitis, unspecified: Secondary | ICD-10-CM

## 2018-11-25 LAB — GLUCOSE, POCT (MANUAL RESULT ENTRY): POC Glucose: 85 mg/dl (ref 70–99)

## 2018-11-25 MED ORDER — CLOTRIMAZOLE-BETAMETHASONE 1-0.05 % EX CREA: 1.0000 "application " | TOPICAL_CREAM | Freq: Two times a day (BID) | CUTANEOUS | 3 refills | Status: DC

## 2018-11-25 MED FILL — CLOTRIMAZOLE-BETAMETHASONE: 1-0.05 | 15 days supply | Qty: 30 | Fill #0

## 2018-11-25 NOTE — Progress Notes (Signed)
Pt. Is here for right foot check Pt. Stated no pain.

## 2018-11-25 NOTE — Progress Notes (Signed)
Patient ID: Justin Houston, male   DOB: [birth year], 53 y.o.   MRN: [id number]   Justin Houston, is a 53 y.o. male  OEH:[id number]  IBB:[id number]  DOB - April 20, 1965  Subjective:  Chief Complaint and HPI: Justin Houston is a 53 y.o. male here today to recheck blood blister R 2nd toe.  No change since last week.  Rechecking bc diabetic.  Blood sugars controlled.    Sees vascular in 1 month.  Has severe varicosities.  He has noticed some dry skin on his arms he would like a cream for.    ROS:   Constitutional:  No f/c, No night sweats, No unexplained weight loss. EENT:  No vision changes, No blurry vision, No hearing changes. No mouth, throat, or ear problems.  Respiratory: No cough, No SOB Cardiac: No CP, no palpitations GI:  No abd pain, No N/V/D. GU: No Urinary s/sx Musculoskeletal: No joint pain Neuro: No headache, no dizziness, no motor weakness.  Skin: No rash Endocrine:  No polydipsia. No polyuria.  Psych: Denies SI/HI  No problems updated.  ALLERGIES: Allergies  Allergen Reactions  . Cozaar [Losartan Potassium]     Hives   . Losartan Potassium-Hctz Hives    PAST MEDICAL HISTORY: Past Medical History:  Diagnosis Date  . Dermatitis   . Diabetes mellitus   . GERD (gastroesophageal reflux disease)   . Hyperlipidemia   . Hypertension   . Lipoma of scalp   . Obesity     MEDICATIONS AT HOME: Prior to Admission medications   Medication Sig Start Date End Date Taking? Authorizing Provider  ALPHAGAN P 0.1 % SOLN Place 1 drop into both eyes 2 (two) times daily. 08/31/18  Yes [name], MD  amLODipine (NORVASC) 10 MG tablet Take 1 tablet (10 mg total) by mouth daily. 08/31/18  Yes [name], MD  atenolol (TENORMIN) 50 MG tablet Take 1 tablet (50 mg total) by mouth daily. 08/31/18  Yes [name], MD  atorvastatin (LIPITOR) 40 MG tablet Take 1 tablet (40 mg total) by mouth daily. 08/31/18  Yes [name], MD  B-D INS SYR ULTRAFINE 1CC/31G 31G  X 5/16" 1 ML MISC USE AS DIRECTED 10/15/16  Yes Renato Shin, MD  B-D UF III MINI PEN NEEDLES 31G X 5 MM MISC USE 1 DAILY 09/24/18  Yes [provider]  Blood Glucose Monitoring Suppl (ONETOUCH VERIO) w/Device KIT Use as instructed to check blood sugar 3 times daily. E11.319 Z79.4 11/03/18  Yes [name], MD  chlorthalidone (HYGROTON) 25 MG tablet Take 1 tablet (25 mg total) by mouth daily. 10/12/18  Yes [name], MD  clobetasol ointment (TEMOVATE) 4.50 % Apply 1 application topically 3 (three) times daily as needed (rash.). 07/12/17  Yes Jeffery, Chelle, PA  colchicine 0.6 MG tablet Take 1 tablet (0.6 mg total) by mouth 2 (two) times daily as needed. 08/31/18  Yes [name], MD  dapagliflozin propanediol (FARXIGA) 5 MG TABS tablet Take 5 mg by mouth daily. 08/31/18  Yes [name], MD  Dulaglutide (TRULICITY) 3.88 EK/8.0KL SOPN Inject 0.75 mg into the skin once a week. 11/02/18  Yes [name], MD  glucose blood (ONETOUCH VERIO) test strip Use as instructed to check blood sugar 3 times daily. K91.791 Z79.4 11/03/18  Yes [name], MD  hydrALAZINE (APRESOLINE) 25 MG tablet TAKE 1 TABLET BY MOUTH 3 TIMES DAILY 10/28/18  Yes [name], MD  insulin detemir (LEVEMIR) 100 UNIT/ML  injection INJECT 0.4 ML in AM and PM 11/02/18  Yes [name], MD  Insulin Pen Needle 30G X 5 MM MISC 1 Stick by Does not apply route daily. 08/31/18  Yes [name], MD  metFORMIN (GLUCOPHAGE) 1000 MG tablet Take 1 tablet (1,000 mg total) by mouth 2 (two) times daily with a meal. 11/16/18  Yes [name], MD  naproxen sodium (ALEVE) 220 MG tablet Take 2 tablets (440 mg total) by mouth 2 (two) times daily as needed (pain). 07/12/17  Yes Jeffery, Chelle, PA  omeprazole (PRILOSEC) 20 MG capsule Take 1 capsule (20 mg total) by mouth daily. 08/14/18  Yes Charlott Rakes, MD  OneTouch Delica Lancets 76P MISC Use as instructed to check blood sugar 3 times daily. E11.319  Z79.4 11/03/18  Yes [name], MD  SENNA PLUS 8.6-50 MG tablet Take 1 tablet by mouth daily. 10/28/18  Yes [name], MD  clotrimazole-betamethasone (LOTRISONE) cream Apply 1 application topically 2 (two) times daily. 11/25/18   Argentina Donovan, PA-C     Objective:  EXAM:   Vitals:   11/25/18 1602  BP: 133/80  Pulse: 79  Temp: 98.9 F (37.2 C)  TempSrc: Oral  SpO2: 97%  Weight: (!) 319 lb 12.8 oz (145.1 kg)  Height: 5' 11"  (1.803 m)    General appearance : A&OX3. NAD. Non-toxic-appearing HEENT: Atraumatic and Normocephalic.  PERRLA. EOM intact.  Chest/Lungs:  Breathing-non-labored, Good air entry bilaterally, breath sounds normal without rales, rhonchi, or wheezing  CVS: S1 S2 regular, no murmurs, gallops, rubs  Severe long-standing varicosities of BLE.  DP pulse 1+=B.  2nd toe on R compared to L. Blood blister 3X52m and not changed(see last note).  No ulceration of skin.  No gangrene or sign of infection.   Neurology:  CN II-XII grossly intact, Non focal.   Psych:  TP linear. J/I WNL. Normal speech. Appropriate eye contact and affect.  Skin:  Dry patches of skin on B arms.    Data Review Lab Results  Component Value Date   HGBA1C 9.6 (A) 08/31/2018   HGBA1C 9.6 (A) 11/28/2017   HGBA1C 7.8 05/26/2017     Assessment & Plan   1. Type 2 diabetes mellitus with retinopathy, with long-term current use of insulin, macular edema presence unspecified, unspecified laterality, unspecified retinopathy severity ([place] Controlled-continue with diabetic diet and current medication regimen - Glucose (CBG)  2. Dermatitis/dry skin - clotrimazole-betamethasone (LOTRISONE) cream; Apply 1 application topically 2 (two) times daily.  Dispense: 30 g; Refill: 3  3. Blood blister No change-def no sign of gangrene  Patient have been counseled extensively about nutrition and exercise  Return for next scheduled appt.  The patient was given clear instructions to go to ER or  return to medical center if symptoms don't improve, worsen or new problems develop. The patient verbalized understanding. The patient was told to call to get lab results if they haven't heard anything in the next week.     AFreeman Caldron PA-C [facility] WSimmesportGDixie NCoulter  11/25/2018, 4:18 PM

## 2018-12-01 ENCOUNTER — Ambulatory Visit: Payer: Self-pay | Admitting: Surgery

## 2018-12-01 ENCOUNTER — Other Ambulatory Visit: Payer: Self-pay | Admitting: Critical Care Medicine

## 2018-12-01 DIAGNOSIS — E1169 Type 2 diabetes mellitus with other specified complication: Secondary | ICD-10-CM

## 2018-12-01 DIAGNOSIS — I10 Essential (primary) hypertension: Secondary | ICD-10-CM

## 2018-12-01 NOTE — H&P (Signed)
History of Present Illness Justin Houston. Mystie Ormand MD; 12/01/2018 2:45 PM) The patient is a 53 year old male who presents with an umbilical hernia. Referred by Dr. Lyda Jester for umbilical hernia and large right scalp lipoma  This is a 52 year old male with multiple medical issues who presents with a 5 year history of an enlarging subcutaneous mass just behind his right ear. This has become quite large and is easily visible. It has begun to protrude through his hairline. He is still able to wear glasses. This area causes some mild discomfort but has never become infected.  The patient also has a large protruding umbilical hernia that has been present over 10 years. It seems to be getting bigger and obviously contained some small intestine. He does notice some issues with constipation but has never shown any obvious obstructive symptoms. He was recently evaluated by Dr. Joya Gaskins at the health and [place] and is referred to [place] for both these issues. His weight is down about 10 pounds as the patient has been trying to walk on a regular basis.  Problem List/Past Medical Rodman Key K. Syeda Prickett, MD; 12/01/2018 2:49 PM) BENIGN LIPOMATOUS NEOPLASM OF SKIN AND SUBCUTANEOUS TISSUE OF HEAD, FACE AND NECK (Q59.5) UMBILICAL HERNIA WITHOUT OBSTRUCTION OR GANGRENE (K42.9) Hypertension Diabetes type 2 Obesity - BMI 44.6   Past Surgical History (Derrico Zhong K. Nettie Cromwell, MD; 12/01/2018 2:49 PM) Colon Polyp Removal - Colonoscopy Colon Removal - Complete Diagnostic Studies History Emeline Gins, [place]; 12/01/2018 2:24 PM) Colonoscopy 1-5 years ago within last year  Allergies Emeline Gins, [place]; 12/01/2018 2:25 PM) Losartan Potassium-HCTZ *ANTIHYPERTENSIVES* Allergies Reconciled  Medication History Emeline Gins, CMA; 12/01/2018 2:26 PM) Atorvastatin Calcium (40MG  Tablet, Oral) Active. Lantus (100UNIT/ML Solution, Subcutaneous) Active. Norvasc (10MG  Tablet, Oral) Active. Atenolol-Chlorthalidone  (50-25MG  Tablet, Oral) Active. GlipiZIDE (10MG  Tablet, Oral) Active. MetFORMIN HCl (1000MG  Tablet, Oral) Active. Zantac (150MG  Tablet, Oral) Active. Chlorthalidone (25MG  Tablet, Oral) Active. Colchicine (0.6MG  Tablet, Oral) Active. hydrALAZINE HCl (25MG  Tablet, Oral) Active. Omeprazole (20MG  Capsule DR, Oral) Active. Medications Reconciled  Social History Emeline Gins, [place]; 12/01/2018 2:24 PM) Caffeine use Coffee, Tea. No alcohol use No drug use Tobacco use Former smoker.  Family History Emeline Gins, [place]; 12/01/2018 2:24 PM) Hypertension Mother.  Other Problems Emeline Gins, CMA; 12/01/2018 2:24 PM) High blood pressure Hypercholesterolemia     Review of Systems Emeline Gins CMA; 12/01/2018 2:24 PM) General Not Present- Appetite Loss, Chills, Fatigue, Fever, Night Sweats, Weight Gain and Weight Loss. Skin Not Present- Change in Wart/Mole, Dryness, Hives, Jaundice, New Lesions, Non-Healing Wounds, Rash and Ulcer. HEENT Not Present- Earache, Hearing Loss, Hoarseness, Nose Bleed, Oral Ulcers, Ringing in the Ears, Seasonal Allergies, Sinus Pain, Sore Throat, Visual Disturbances, Wears glasses/contact lenses and Yellow Eyes. Respiratory Not Present- Bloody sputum, Chronic Cough, Difficulty Breathing, Snoring and Wheezing. Breast Not Present- Breast Mass, Breast Pain, Nipple Discharge and Skin Changes. Cardiovascular Not Present- Chest Pain, Difficulty Breathing Lying Down, Leg Cramps, Palpitations, Rapid Heart Rate, Shortness of Breath and Swelling of Extremities. Gastrointestinal Present- Constipation. Not Present- Abdominal Pain, Bloating, Bloody Stool, Change in Bowel Habits, Chronic diarrhea, Difficulty Swallowing, Excessive gas, Gets full quickly at meals, Hemorrhoids, Indigestion, Nausea, Rectal Pain and Vomiting. Male Genitourinary Not Present- Blood in Urine, Change in Urinary Stream, Frequency, Impotence, Nocturia, Painful Urination, Urgency and Urine  Leakage. Musculoskeletal Not Present- Back Pain, Joint Pain, Joint Stiffness, Muscle Pain, Muscle Weakness and Swelling of Extremities. Neurological Not Present- Decreased Memory, Fainting, Headaches, Numbness, Seizures, Tingling, Tremor, Trouble walking and Weakness. Psychiatric Not Present-  Anxiety, Bipolar, Change in Sleep Pattern, Depression, Fearful and Frequent crying. Endocrine Not Present- Cold Intolerance, Excessive Hunger, Hair Changes, Heat Intolerance, Hot flashes and New Diabetes. Hematology Not Present- Blood Thinners, Easy Bruising, Excessive bleeding, Gland problems, HIV and Persistent Infections.  Vitals Emeline Gins CMA; 12/01/2018 2:25 PM) 12/01/2018 2:24 PM Weight: 320.2 lb Height: 71in Body Surface Area: 2.58 m Body Mass Index: 44.66 kg/m  Temp.: 98.65F  Pulse: 84 (Regular)  BP: 152/92 (Sitting, Left Arm, Standard)        Physical Exam Rodman Key K. Winston Sobczyk MD; 12/01/2018 2:48 PM)  The physical exam findings are as follows: Note:WDWN in NAD - obese Eyes: Pupils equal, round; sclera anicteric HENT: Oral mucosa moist; good dentition; right posterior auricular - oval-shaped protruding subcutaneous mass measuring about 7 x 6 cm. Some overlying hair loss. well-demarcated. No sign of inflammation. Neck: No masses palpated, no thyromegaly Lungs: CTA bilaterally; normal respiratory effort CV: Regular rate and rhythm; no murmurs; extremities well-perfused with no edema Abd: +bowel sounds, soft, massively obese, non-tender, no palpable organomegaly; protruding umbilical hernia - the sac is about 5 cm across - partially reducible; palpable small bowel within the hernia. Skin: Warm, dry; no sign of jaundice Psychiatric - alert and oriented x 4; calm mood and affect    Assessment & Plan Rodman Key K. Leta Bucklin MD; 0/17/7939 0:30 PM)  UMBILICAL HERNIA WITHOUT OBSTRUCTION OR GANGRENE (K42.9) Impression: Large protruding umbilical hernia - partially reducible;  contains small bowel   BENIGN LIPOMATOUS NEOPLASM OF SKIN AND SUBCUTANEOUS TISSUE OF HEAD, FACE AND NECK (D17.0) Impression: Right posterior auricular - 7 x 6 cm subcutaneous  Current Plans Schedule for Surgery - umbilical hernia repair with mesh and excision of large subcutaneous lipoma right scalp. The surgical procedure has been discussed with the patient. Potential risks, benefits, alternative treatments, and expected outcomes have been explained. All of the patient's questions at this time have been answered. The likelihood of reaching the patient's treatment goal is good. The patient understand the proposed surgical procedure and wishes to proceed.   We will need to leave a drain under the lipoma excision site after surgery. We will excise much of the protruding umbilical skin.  Justin Houston. Georgette Dover, MD, [facility] Beeper [phone]  12/01/2018 2:49 PM

## 2018-12-07 IMAGING — DX DG ANKLE COMPLETE 3+V*R*
3 series · 3 of 3 positions shown · non-contrast
Comparison: None.

CLINICAL DATA: Right ankle pain and swelling after twist injury.

EXAM:
RIGHT ANKLE - COMPLETE 3+ VIEW

[ankle ap]
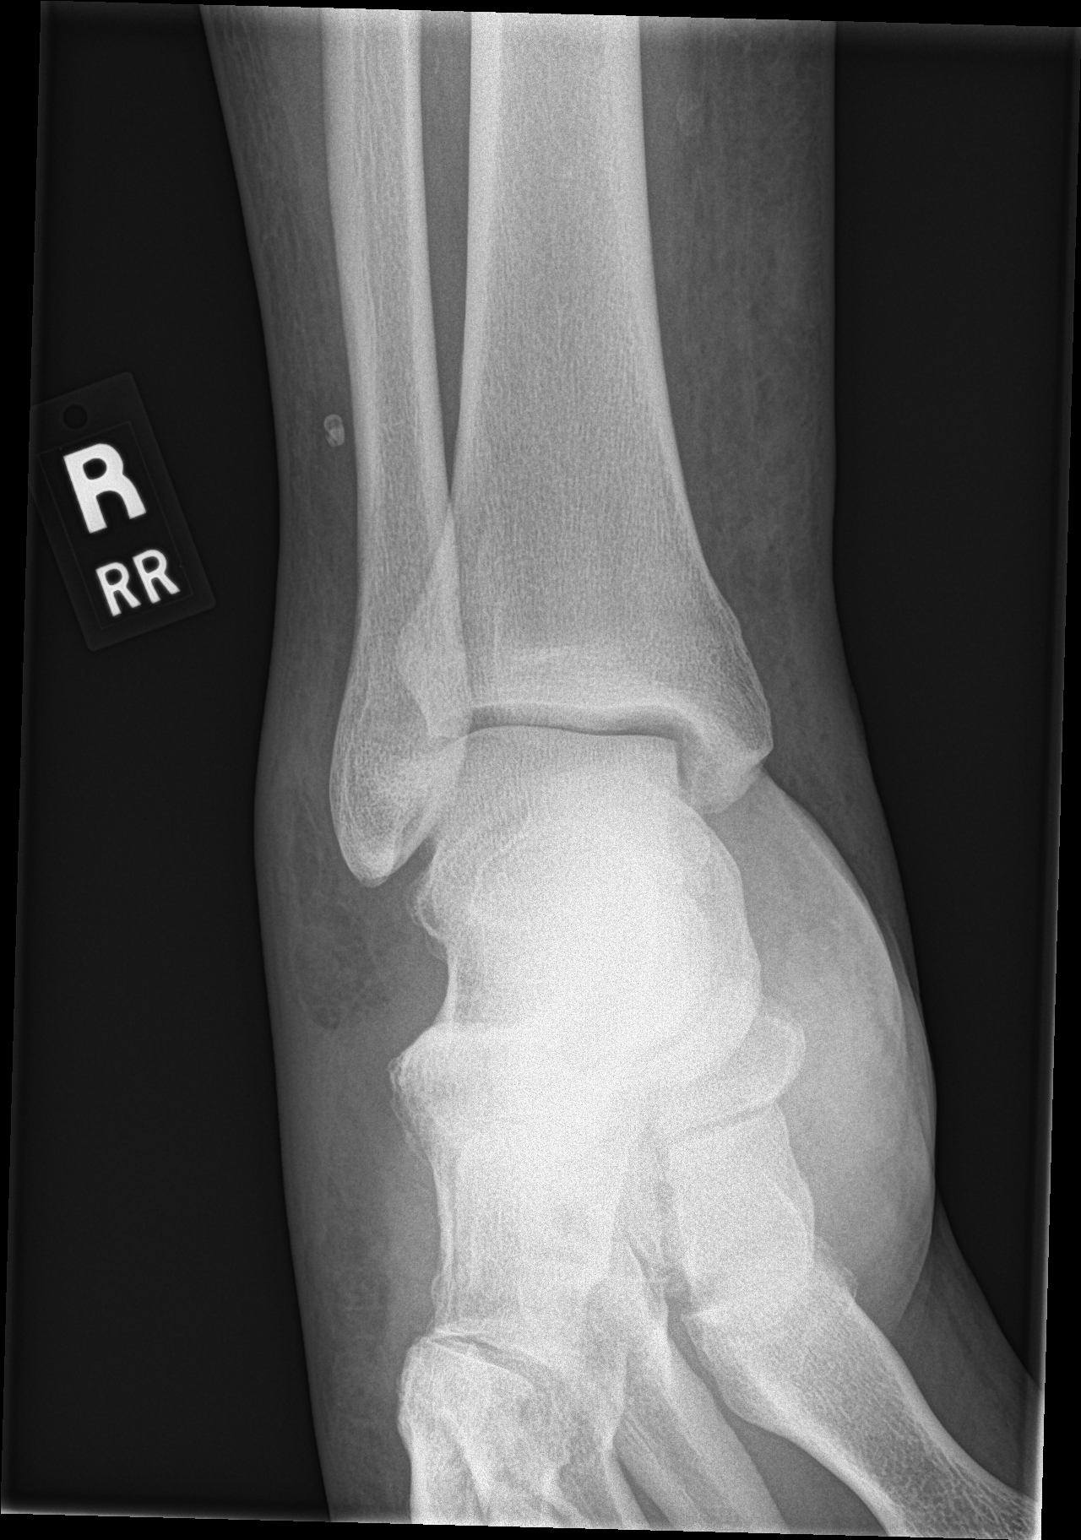

[ankle obl]
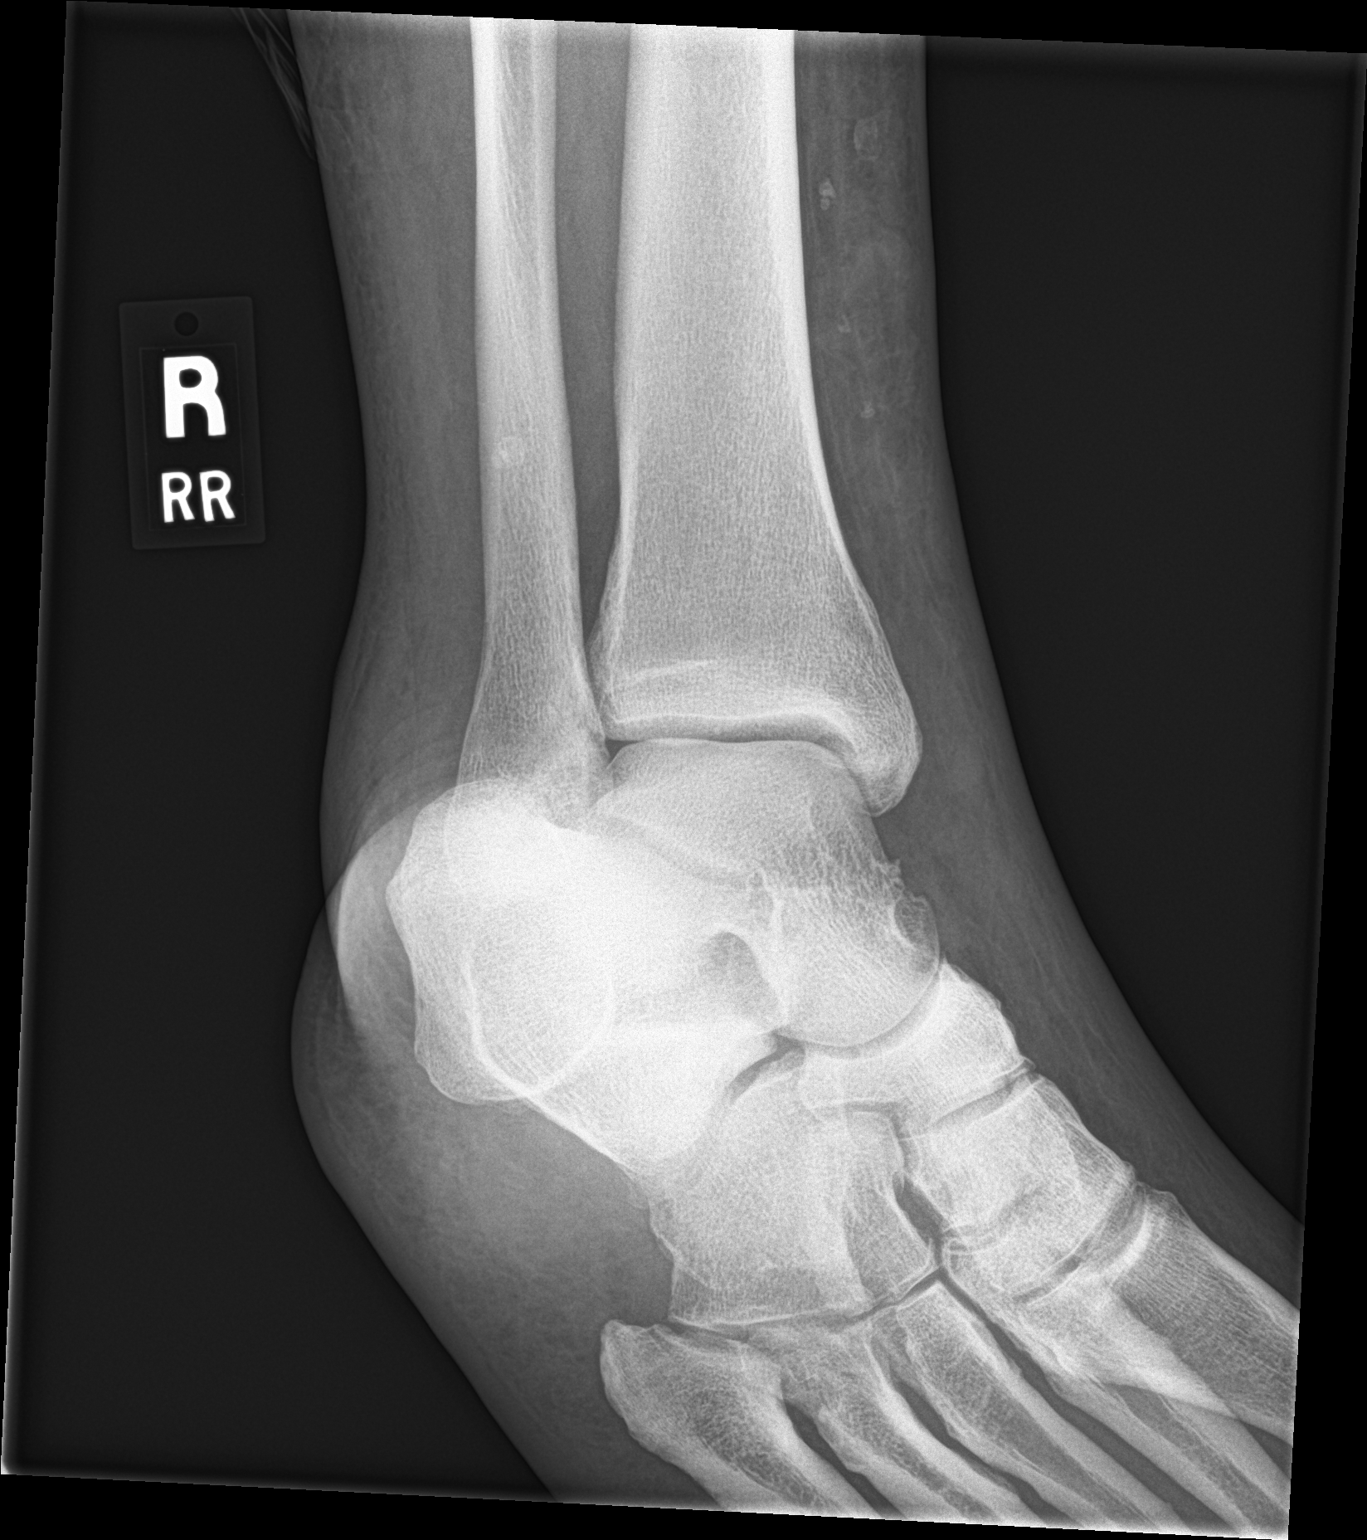

[ankle lat]
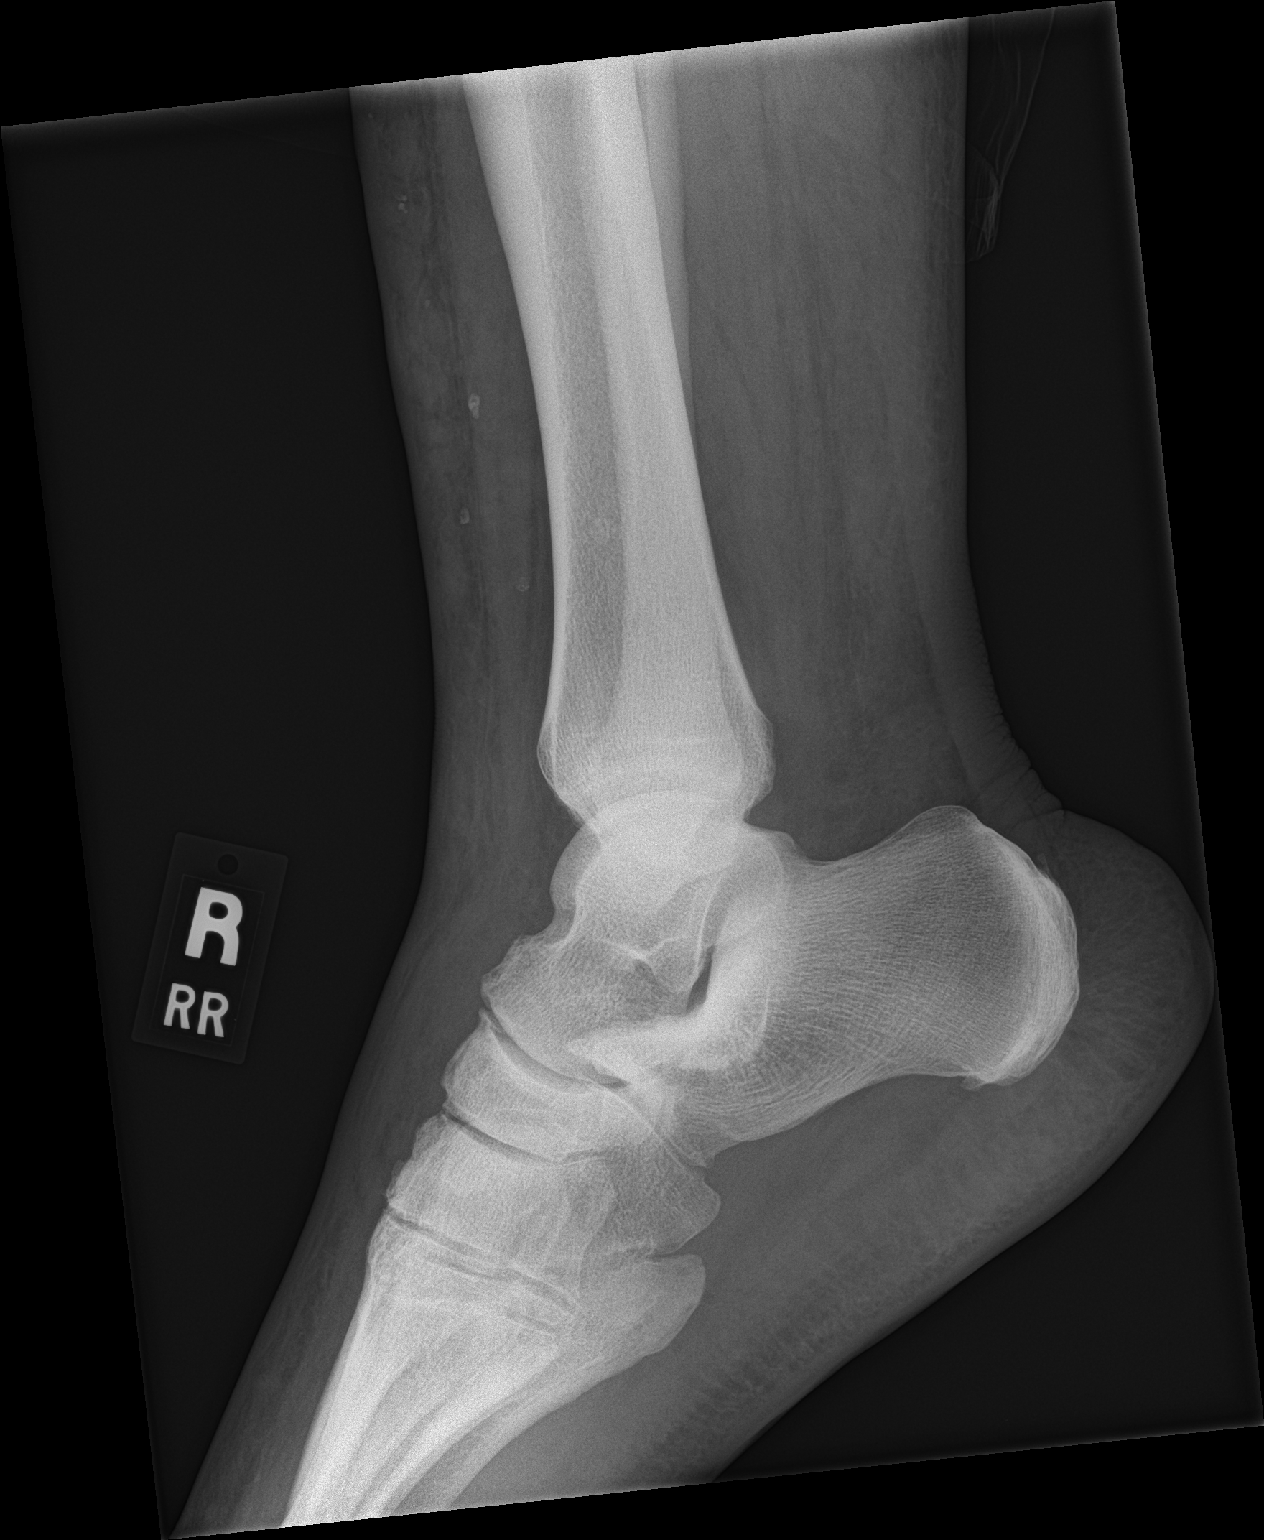

[3 of 3 positions shown; findings below may reference images not displayed]

FINDINGS: There is no evidence of fracture, dislocation, or joint effusion.
Ankle mortise is preserved. There is mild midfoot osteoarthritis.
Tiny plantar calcaneal spur. Diffuse soft tissue edema. Phleboliths
in the soft tissues.
IMPRESSION: Soft tissue edema.  No acute fracture.

## 2018-12-11 ENCOUNTER — Other Ambulatory Visit: Payer: Self-pay

## 2018-12-11 ENCOUNTER — Ambulatory Visit: Payer: BC Managed Care – PPO | Admitting: Podiatry

## 2018-12-11 DIAGNOSIS — E1151 Type 2 diabetes mellitus with diabetic peripheral angiopathy without gangrene: Secondary | ICD-10-CM

## 2018-12-11 DIAGNOSIS — B351 Tinea unguium: Secondary | ICD-10-CM

## 2018-12-11 DIAGNOSIS — E1169 Type 2 diabetes mellitus with other specified complication: Secondary | ICD-10-CM

## 2018-12-14 ENCOUNTER — Other Ambulatory Visit: Payer: Self-pay | Admitting: Critical Care Medicine

## 2018-12-14 DIAGNOSIS — E785 Hyperlipidemia, unspecified: Secondary | ICD-10-CM

## 2018-12-14 DIAGNOSIS — E1169 Type 2 diabetes mellitus with other specified complication: Secondary | ICD-10-CM

## 2018-12-14 DIAGNOSIS — E119 Type 2 diabetes mellitus without complications: Secondary | ICD-10-CM

## 2018-12-15 ENCOUNTER — Telehealth: Payer: Self-pay | Admitting: Critical Care Medicine

## 2018-12-15 MED ORDER — INSULIN DETEMIR 100 UNIT/ML ~~LOC~~ SOLN: SUBCUTANEOUS | 2 refills | Status: DC

## 2018-12-15 NOTE — Telephone Encounter (Signed)
New Message   1) Medication(s) Requested (by name): insulin detemir (LEVEMIR) 100 UNIT/ML injection  2) Pharmacy [facility] Choice[facility] [address]  3) Special Requests: Pt is requesting a refill until his appt on 11/23 and pt was last seen on 11/25/2018. He is also requesting bottles   Approved medications will be sent to the pharmacy, we will reach out if there is an issue.  Requests made after 3pm may not be addressed until the following business day!  If a patient is unsure of the name of the medication(s) please note and ask patient to call back when they are able to provide all info, do not send to responsible party until all information is available!

## 2018-12-16 NOTE — Telephone Encounter (Signed)
Refills sent

## 2018-12-17 ENCOUNTER — Other Ambulatory Visit: Payer: Self-pay | Admitting: Pharmacist

## 2018-12-17 ENCOUNTER — Other Ambulatory Visit: Payer: Self-pay | Admitting: Critical Care Medicine

## 2018-12-17 DIAGNOSIS — E1169 Type 2 diabetes mellitus with other specified complication: Secondary | ICD-10-CM

## 2018-12-17 DIAGNOSIS — E669 Obesity, unspecified: Secondary | ICD-10-CM

## 2018-12-17 MED ORDER — INSULIN DETEMIR 100 UNIT/ML ~~LOC~~ SOLN: 40.0000 [IU] | Freq: Two times a day (BID) | SUBCUTANEOUS | 2 refills | Status: DC

## 2018-12-22 ENCOUNTER — Telehealth: Payer: Self-pay | Admitting: Podiatry

## 2018-12-22 NOTE — Telephone Encounter (Signed)
Pt stated he was seen on 28 August for a callus. Pt states he has another one and it is painful. Wants to know what he can do to treat it or if there is a special pad he can wear in his shoe to help.

## 2018-12-22 NOTE — Telephone Encounter (Signed)
Left message for pt to call to set up an appt due to him being diabetic and we would not want the new callous to develop an ulcer beneath.

## 2018-12-24 ENCOUNTER — Ambulatory Visit (INDEPENDENT_AMBULATORY_CARE_PROVIDER_SITE_OTHER): Payer: BC Managed Care – PPO | Admitting: Podiatry

## 2018-12-24 ENCOUNTER — Other Ambulatory Visit: Payer: Self-pay

## 2018-12-24 DIAGNOSIS — B351 Tinea unguium: Secondary | ICD-10-CM | POA: Diagnosis not present

## 2018-12-24 DIAGNOSIS — E1169 Type 2 diabetes mellitus with other specified complication: Secondary | ICD-10-CM

## 2018-12-24 DIAGNOSIS — E1151 Type 2 diabetes mellitus with diabetic peripheral angiopathy without gangrene: Secondary | ICD-10-CM

## 2018-12-30 ENCOUNTER — Other Ambulatory Visit: Payer: Self-pay | Admitting: Critical Care Medicine

## 2018-12-30 ENCOUNTER — Other Ambulatory Visit: Payer: Self-pay

## 2018-12-30 DIAGNOSIS — I83893 Varicose veins of bilateral lower extremities with other complications: Secondary | ICD-10-CM

## 2018-12-30 DIAGNOSIS — E1169 Type 2 diabetes mellitus with other specified complication: Secondary | ICD-10-CM

## 2018-12-31 ENCOUNTER — Encounter: Payer: Self-pay | Admitting: Vascular Surgery

## 2018-12-31 ENCOUNTER — Ambulatory Visit (INDEPENDENT_AMBULATORY_CARE_PROVIDER_SITE_OTHER): Payer: BC Managed Care – PPO | Admitting: Vascular Surgery

## 2018-12-31 ENCOUNTER — Ambulatory Visit (HOSPITAL_COMMUNITY)
Admission: RE | Admit: 2018-12-31 | Discharge: 2018-12-31 | Disposition: A | Discharge status: Home / Self Care | Payer: BC Managed Care – PPO | Source: Ambulatory Visit | Attending: Vascular Surgery | Admitting: Vascular Surgery

## 2018-12-31 ENCOUNTER — Other Ambulatory Visit: Payer: Self-pay

## 2018-12-31 VITALS — BP 149/87 | HR 69 | Temp 97.4°F | Resp 16 | Ht 71.0 in | Wt 316.6 lb

## 2018-12-31 DIAGNOSIS — I83893 Varicose veins of bilateral lower extremities with other complications: Secondary | ICD-10-CM

## 2018-12-31 DIAGNOSIS — I83813 Varicose veins of bilateral lower extremities with pain: Secondary | ICD-10-CM | POA: Diagnosis not present

## 2018-12-31 NOTE — Progress Notes (Signed)
REASON FOR CONSULT:    Bilateral varicose veins.  The consult is requested by Dr. Asencion Noble.  ASSESSMENT & PLAN:   CHRONIC VENOUS INSUFFICIENCY AND BILATERAL VARICOSE VEINS: This patient has fairly impressive dilated varicose veins of both lower extremities which do cause some symptoms but currently the symptoms are not disabling.  We have discussed conservative measures.  I have discussed with him the importance of intermittent leg elevation and the proper positioning for this.  I have recommended a thigh-high compression stocking with a gradient of 15 to 20 mmHg.  I have encouraged him to avoid prolonged sitting and standing.  We discussed the importance of exercise specifically walking and water aerobics.  In addition we discussed weight management as abdominal obesity especially increases lower extremity venous pressure.  Certainly if his symptoms progress we could try him in a 20-30 thigh-high stocking although they are afraid he may have a hard time getting these on.  If he continues to have symptoms despite a more aggressive approach on the left side I think he would be a candidate for laser ablation of the anterior sensory saphenous vein with greater than 20 stab phlebectomies.  On the right side I suspect that he has deep venous reflux in the femoral vein which is feeding the great saphenous vein in the proximal and mid thigh through an incompetent perforator and then feeding the large cluster of varicose veins in his right leg.  He might potentially be a candidate for stab phlebectomies on the right.   Deitra Mayo, MD, FACS Beeper [phone] Office: [phone]   HPI:   Justin Houston is a pleasant 53 y.o. male, who presents with a long history of varicose veins of both lower extremities.  He states that he has had varicose veins really since he was in high school.  He has some aching pain and heaviness associated with his varicosities.  The symptoms are aggravated by  standing and sitting and relieved somewhat with elevation.  He is really not worn compression stockings.  He has not required ibuprofen for pain.  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  He is unaware of any previous history of DVT or phlebitis.  He has not had any previous venous procedures.  Past Medical History:  Diagnosis Date  . Dermatitis   . Diabetes mellitus   . GERD (gastroesophageal reflux disease)   . Hyperlipidemia   . Hypertension   . Lipoma of scalp   . Obesity     Family History  Problem Relation Age of Onset  . Stroke Mother   . Hypertension Mother   . Diabetes Maternal Grandmother   . Prostate cancer Maternal Grandfather   . Diabetes Other     SOCIAL HISTORY: Social History   Socioeconomic History  . Marital status: Single    Spouse name: n/a  . Number of children: 0  . Years of education: 64  . Highest education level: Not on file  Occupational History  . Occupation: Software engineer: [employer]  Social Needs  . Financial resource strain: Not on file  . Food insecurity    Worry: Not on file    Inability: Not on file  . Transportation needs    Medical: Not on file    Non-medical: Not on file  Tobacco Use  . Smoking status: Former Smoker    Quit date:  09/05/1994    Years since quitting: 24.3  . Smokeless tobacco: Never Used  Substance and Sexual Activity  . Alcohol use: No    Alcohol/week: 0.0 standard drinks  . Drug use: No  . Sexual activity: Never  Lifestyle  . Physical activity    Days per week: Not on file    Minutes per session: Not on file  . Stress: Not on file  Relationships  . Social Herbalist on phone: Not on file    Gets together: Not on file    Attends religious service: Not on file    Active member of club or organization: Not on file    Attends meetings of clubs or organizations: Not on file    Relationship status: Not on  file  . Intimate partner violence    Fear of current or ex partner: Not on file    Emotionally abused: Not on file    Physically abused: Not on file    Forced sexual activity: Not on file  Other Topics Concern  . Not on file  Social History Narrative   Master's Degree in Adult Education.  Lives alone.   No siblings.    Allergies  Allergen Reactions  . Cozaar [Losartan Potassium]     Hives   . Losartan Potassium-Hctz Hives    Current Outpatient Medications  Medication Sig Dispense Refill  . ALPHAGAN P 0.1 % SOLN Place 1 drop into both eyes 2 (two) times daily. 15 mL 1  . amLODipine (NORVASC) 10 MG tablet Take 1 tablet (10 mg total) by mouth daily. 30 tablet 0  . atenolol (TENORMIN) 50 MG tablet Take 1 tablet (50 mg total) by mouth daily. 30 tablet 3  . atorvastatin (LIPITOR) 40 MG tablet TAKE 1 TABLET BY MOUTH EVERY DAY 30 tablet 3  . B-D INS SYR ULTRAFINE 1CC/31G 31G X 5/16" 1 ML MISC USE AS DIRECTED 100 each 0  . B-D UF III MINI PEN NEEDLES 31G X 5 MM MISC USE 1 DAILY    . Blood Glucose Monitoring Suppl (ONETOUCH VERIO) w/Device KIT Use as instructed to check blood sugar 3 times daily. E11.319 Z79.4 1 kit 0  . chlorthalidone (HYGROTON) 25 MG tablet Take 1 tablet (25 mg total) by mouth daily. 30 tablet 3  . colchicine 0.6 MG tablet Take 1 tablet (0.6 mg total) by mouth 2 (two) times daily as needed. 20 tablet 1  . FARXIGA 5 MG TABS tablet TAKE 1 TABLET BY MOUTH EVERY DAY 30 tablet 3  . glucose blood (ONETOUCH VERIO) test strip Use as instructed to check blood sugar 3 times daily. E11.319 Z79.4 100 each 11  . insulin detemir (LEVEMIR) 100 UNIT/ML injection Inject 0.4 mLs (40 Units total) into the skin 2 (two) times daily. 20 mL 2  . Insulin Pen Needle 30G X 5 MM MISC 1 Stick by Does not apply route daily. 100 each 1  . metFORMIN (GLUCOPHAGE) 1000 MG tablet Take 1 tablet (1,000 mg total) by mouth 2 (two) times daily with a meal. 60 tablet 0  . naproxen sodium (ALEVE) 220 MG tablet  Take 2 tablets (440 mg total) by mouth 2 (two) times daily as needed (pain). 180 tablet 0  . OneTouch Delica Lancets 70J MISC Use as instructed to check blood sugar 3 times daily. E11.319 Z79.4 100 each 11  . SENNA PLUS 8.6-50 MG tablet Take 1 tablet by mouth daily. 30 tablet 2  . TRULICITY 6.28 ZM/6.2HU SOPN Inject  0.75 mg into the skin once a week. 2 mL 0  . clobetasol ointment (TEMOVATE) 6.75 % Apply 1 application topically 3 (three) times daily as needed (rash.). (Patient not taking: Reported on 12/31/2018) 60 g 1  . clotrimazole-betamethasone (LOTRISONE) cream Apply 1 application topically 2 (two) times daily. (Patient not taking: Reported on 12/31/2018) 30 g 3  . hydrALAZINE (APRESOLINE) 25 MG tablet TAKE 1 TABLET BY MOUTH 3 TIMES DAILY 90 tablet 1  . omeprazole (PRILOSEC) 20 MG capsule Take 1 capsule (20 mg total) by mouth daily. (Patient not taking: Reported on 12/31/2018) 30 capsule 3   No current facility-administered medications for this visit.     REVIEW OF SYSTEMS:  [X]  denotes positive finding, [ ]  denotes negative finding Cardiac  Comments:  Chest pain or chest pressure:    Shortness of breath upon exertion:    Short of breath when lying flat:    Irregular heart rhythm:        Vascular    Pain in calf, thigh, or hip brought on by ambulation:    Pain in feet at night that wakes you up from your sleep:     Blood clot in your veins:    Leg swelling:  x  ankles      Pulmonary    Oxygen at home:    Productive cough:     Wheezing:         Neurologic    Sudden weakness in arms or legs:     Sudden numbness in arms or legs:     Sudden onset of difficulty speaking or slurred speech:    Temporary loss of vision in one eye:     Problems with dizziness:         Gastrointestinal    Blood in stool:     Vomited blood:         Genitourinary    Burning when urinating:     Blood in urine:        Psychiatric    Major depression:         Hematologic    Bleeding problems:     Problems with blood clotting too easily:        Skin    Rashes or ulcers:        Constitutional    Fever or chills:     PHYSICAL EXAM:   Vitals:   12/31/18 1456  BP: (!) 149/87  Pulse: 69  Resp: 16  Temp: (!) 97.4 F (36.3 C)  TempSrc: Temporal  SpO2: 100%  Weight: (!) 316 lb 9.6 oz (143.6 kg)  Height: 5' 11"  (1.803 m)   Body mass index is 44.16 kg/m.   GENERAL: The patient is a well-nourished male, in no acute distress. The vital signs are documented above. CARDIAC: There is a regular rate and rhythm.  VASCULAR: I do not detect carotid bruits. He has palpable pedal pulses. VENOUS EXAM: He has markedly enlarged dilated varicose veins in his medial right thigh, lateral left thigh, and medial left leg as documented below.        I did look at the left accessory saphenous vein myself with the SonoSite and this is markedly dilated and likely fills this large cluster of varicose veins along the lateral aspect of his left thigh.  I think this could be cannulated in the mid thigh and we could dress the accessory saphenous on the left. PULMONARY: There is good air exchange bilaterally without wheezing or rales. ABDOMEN:  Soft and non-tender with normal pitched bowel sounds.  MUSCULOSKELETAL: There are no major deformities or cyanosis. NEUROLOGIC: No focal weakness or paresthesias are detected. SKIN: There are no ulcers or rashes noted. PSYCHIATRIC: The patient has a normal affect.  DATA:    VENOUS DUPLEX: I have independently interpreted his venous duplex scan.  On the right side there is no evidence of DVT.  There is no reflux at the saphenofemoral junction.  There is some reflux in the deep system involving the femoral vein.  There is reflux in the superficial system involving the great saphenous vein in the mid and proximal thigh.  On the left side there is reflux of the saphenofemoral junction and in the distal thigh and the left great saphenous vein.  There is an  anterior accessory saphenous vein which is markedly dilated up to 0.72 cm.

## 2019-01-01 ENCOUNTER — Other Ambulatory Visit: Payer: Self-pay | Admitting: Pharmacist

## 2019-01-01 DIAGNOSIS — I10 Essential (primary) hypertension: Secondary | ICD-10-CM

## 2019-01-01 DIAGNOSIS — E1169 Type 2 diabetes mellitus with other specified complication: Secondary | ICD-10-CM

## 2019-01-01 MED ORDER — AMLODIPINE BESYLATE 10 MG PO TABS: 10.0000 mg | ORAL_TABLET | Freq: Every day | ORAL | 2 refills | Status: DC

## 2019-01-01 MED ORDER — FARXIGA 5 MG PO TABS: 5.0000 mg | ORAL_TABLET | Freq: Every day | ORAL | 2 refills | Status: DC

## 2019-01-01 MED ORDER — TRULICITY 0.75 MG/0.5ML ~~LOC~~ SOAJ: 0.7500 mg | SUBCUTANEOUS | 2 refills | Status: DC

## 2019-01-04 ENCOUNTER — Telehealth: Payer: Self-pay | Admitting: Podiatry

## 2019-01-04 NOTE — Telephone Encounter (Signed)
Entered in error

## 2019-01-07 ENCOUNTER — Other Ambulatory Visit: Payer: Self-pay | Admitting: Critical Care Medicine

## 2019-01-07 DIAGNOSIS — I10 Essential (primary) hypertension: Secondary | ICD-10-CM

## 2019-01-10 NOTE — Progress Notes (Signed)
Subjective:  Patient ID: Justin Houston, male    DOB: 12-31-1965,  MRN: [id number]  Chief Complaint  Patient presents with  . Nail Problem    Nail trim 1-5 bilateral  . Foot Problem    Pt states right 2nd digit has blood blister for 3-4 weeks, denies any pain or drainage, denies fever/nausea/vomiting/chills.    53 y.o. male presents  for diabetic foot care. Last AMBS was unknown. Denies numbness and tingling in their feet. Denies cramping in legs and thighs.  Review of Systems: Negative except as noted in the HPI. Denies N/V/F/Ch.  Past Medical History:  Diagnosis Date  . Dermatitis   . Diabetes mellitus   . GERD (gastroesophageal reflux disease)   . Hyperlipidemia   . Hypertension   . Lipoma of scalp   . Obesity     Current Outpatient Medications:  .  ALPHAGAN P 0.1 % SOLN, Place 1 drop into both eyes 2 (two) times daily., Disp: 15 mL, Rfl: 1 .  atenolol (TENORMIN) 50 MG tablet, Take 1 tablet (50 mg total) by mouth daily., Disp: 30 tablet, Rfl: 3 .  B-D INS SYR ULTRAFINE 1CC/31G 31G X 5/16" 1 ML MISC, USE AS DIRECTED, Disp: 100 each, Rfl: 0 .  B-D UF III MINI PEN NEEDLES 31G X 5 MM MISC, USE 1 DAILY, Disp: , Rfl:  .  Blood Glucose Monitoring Suppl (ONETOUCH VERIO) w/Device KIT, Use as instructed to check blood sugar 3 times daily. E11.319 Z79.4, Disp: 1 kit, Rfl: 0 .  chlorthalidone (HYGROTON) 25 MG tablet, Take 1 tablet (25 mg total) by mouth daily., Disp: 30 tablet, Rfl: 3 .  clobetasol ointment (TEMOVATE) 9.56 %, Apply 1 application topically 3 (three) times daily as needed (rash.). (Patient not taking: Reported on 12/31/2018), Disp: 60 g, Rfl: 1 .  clotrimazole-betamethasone (LOTRISONE) cream, Apply 1 application topically 2 (two) times daily. (Patient not taking: Reported on 12/31/2018), Disp: 30 g, Rfl: 3 .  colchicine 0.6 MG tablet, Take 1 tablet (0.6 mg total) by mouth 2 (two) times daily as needed., Disp: 20 tablet,  Rfl: 1 .  glucose blood (ONETOUCH VERIO) test strip, Use as instructed to check blood sugar 3 times daily. E11.319 Z79.4, Disp: 100 each, Rfl: 11 .  Insulin Pen Needle 30G X 5 MM MISC, 1 Stick by Does not apply route daily., Disp: 100 each, Rfl: 1 .  metFORMIN (GLUCOPHAGE) 1000 MG tablet, Take 1 tablet (1,000 mg total) by mouth 2 (two) times daily with a meal., Disp: 60 tablet, Rfl: 0 .  naproxen sodium (ALEVE) 220 MG tablet, Take 2 tablets (440 mg total) by mouth 2 (two) times daily as needed (pain)., Disp: 180 tablet, Rfl: 0 .  omeprazole (PRILOSEC) 20 MG capsule, Take 1 capsule (20 mg total) by mouth daily. (Patient not taking: Reported on 12/31/2018), Disp: 30 capsule, Rfl: 3 .  OneTouch Delica Lancets 38V MISC, Use as instructed to check blood sugar 3 times daily. E11.319 Z79.4, Disp: 100 each, Rfl: 11 .  SENNA PLUS 8.6-50 MG tablet, Take 1 tablet by mouth daily., Disp: 30 tablet, Rfl: 2 .  amLODipine (NORVASC) 10 MG tablet, Take 1 tablet (10 mg total) by mouth daily., Disp: 30 tablet, Rfl: 2 .  atorvastatin (LIPITOR) 40 MG tablet, TAKE 1 TABLET BY MOUTH EVERY DAY, Disp: 30 tablet, Rfl: 3 .  dapagliflozin propanediol (FARXIGA) 5 MG TABS tablet, Take 5 mg by mouth daily., Disp: 30 tablet, Rfl: 2 .  Dulaglutide (TRULICITY) 5.64 PP/2.9JJ  SOPN, Inject 0.75 mg into the skin once a week., Disp: 2 mL, Rfl: 2 .  hydrALAZINE (APRESOLINE) 25 MG tablet, TAKE 1 TABLET BY MOUTH 3 TIMES DAILY, Disp: 90 tablet, Rfl: 1 .  insulin detemir (LEVEMIR) 100 UNIT/ML injection, Inject 0.4 mLs (40 Units total) into the skin 2 (two) times daily., Disp: 20 mL, Rfl: 2  Social History   Tobacco Use  Smoking Status Former Smoker  . Quit date: 09/05/1994  . Years since quitting: 24.3  Smokeless Tobacco Never Used    Allergies  Allergen Reactions  . Cozaar [Losartan Potassium]     Hives   . Losartan Potassium-Hctz Hives   Objective:   There were no vitals filed for this visit. There is no height or weight on  file to calculate BMI. Constitutional Well developed. Well nourished.  Vascular Dorsalis pedis pulses present 1+ bilaterally  Posterior tibial pulses absent bilaterally  Pedal hair growth diminished. Capillary refill normal to all digits.  No cyanosis or clubbing noted.  Neurologic Normal speech. Oriented to person, place, and time. Epicritic sensation to light touch grossly present bilaterally. Protective sensation with 5.07 monofilament  present bilaterally. Vibratory sensation present bilaterally.  Dermatologic Nails elongated, thickened, dystrophic. No open wounds. HPK lateral 5th Met base  Orthopedic: Normal joint ROM without pain or crepitus bilaterally. No visible deformities. No bony tenderness.   Assessment:   1. Onychomycosis of multiple toenails with type 2 diabetes mellitus and peripheral angiopathy ([place])    Plan:  Patient was evaluated and treated and all questions answered.  Diabetes with PAD, Onychomycosis -Nails debrided x10  Procedure: Nail Debridement Rationale: Patient meets criteria for routine foot care due to PAD Type of Debridement: manual, sharp debridement. Instrumentation: Nail nipper, rotary burr. Number of Nails: 10   Return in about 3 months (around 03/13/2019) for Diabetic Foot Care.

## 2019-01-13 NOTE — Progress Notes (Signed)
Subjective:  Patient ID: Justin Houston, male    DOB: Dec 09, 1965,  MRN: [id number]  Chief Complaint  Patient presents with  . Callouses    Left foot lateral aspect callous check/trim. Right foot callous check.  . Nail Problem    Nail trim biltateral 1-5.  Marland Kitchen Foot Problem    Pt states occasional left medial ankle swelling, was discussed at previous visit.    53 y.o. male presents  for diabetic foot care. Last AMBS was unknown. Denies numbness and tingling in their feet. Denies cramping in legs and thighs.  Review of Systems: Negative except as noted in the HPI. Denies N/V/F/Ch.  Past Medical History:  Diagnosis Date  . Dermatitis   . Diabetes mellitus   . GERD (gastroesophageal reflux disease)   . Hyperlipidemia   . Hypertension   . Lipoma of scalp   . Obesity     Current Outpatient Medications:  .  ALPHAGAN P 0.1 % SOLN, Place 1 drop into both eyes 2 (two) times daily., Disp: 15 mL, Rfl: 1 .  atenolol (TENORMIN) 50 MG tablet, Take 1 tablet (50 mg total) by mouth daily., Disp: 30 tablet, Rfl: 3 .  atorvastatin (LIPITOR) 40 MG tablet, TAKE 1 TABLET BY MOUTH EVERY DAY, Disp: 30 tablet, Rfl: 3 .  B-D INS SYR ULTRAFINE 1CC/31G 31G X 5/16" 1 ML MISC, USE AS DIRECTED, Disp: 100 each, Rfl: 0 .  B-D UF III MINI PEN NEEDLES 31G X 5 MM MISC, USE 1 DAILY, Disp: , Rfl:  .  Blood Glucose Monitoring Suppl (ONETOUCH VERIO) w/Device KIT, Use as instructed to check blood sugar 3 times daily. E11.319 Z79.4, Disp: 1 kit, Rfl: 0 .  chlorthalidone (HYGROTON) 25 MG tablet, Take 1 tablet (25 mg total) by mouth daily., Disp: 30 tablet, Rfl: 3 .  clobetasol ointment (TEMOVATE) 8.37 %, Apply 1 application topically 3 (three) times daily as needed (rash.). (Patient not taking: Reported on 12/31/2018), Disp: 60 g, Rfl: 1 .  clotrimazole-betamethasone (LOTRISONE) cream, Apply 1 application topically 2 (two) times daily. (Patient not taking: Reported on  12/31/2018), Disp: 30 g, Rfl: 3 .  colchicine 0.6 MG tablet, Take 1 tablet (0.6 mg total) by mouth 2 (two) times daily as needed., Disp: 20 tablet, Rfl: 1 .  glucose blood (ONETOUCH VERIO) test strip, Use as instructed to check blood sugar 3 times daily. E11.319 Z79.4, Disp: 100 each, Rfl: 11 .  insulin detemir (LEVEMIR) 100 UNIT/ML injection, Inject 0.4 mLs (40 Units total) into the skin 2 (two) times daily., Disp: 20 mL, Rfl: 2 .  Insulin Pen Needle 30G X 5 MM MISC, 1 Stick by Does not apply route daily., Disp: 100 each, Rfl: 1 .  metFORMIN (GLUCOPHAGE) 1000 MG tablet, Take 1 tablet (1,000 mg total) by mouth 2 (two) times daily with a meal., Disp: 60 tablet, Rfl: 0 .  naproxen sodium (ALEVE) 220 MG tablet, Take 2 tablets (440 mg total) by mouth 2 (two) times daily as needed (pain)., Disp: 180 tablet, Rfl: 0 .  omeprazole (PRILOSEC) 20 MG capsule, Take 1 capsule (20 mg total) by mouth daily. (Patient not taking: Reported on 12/31/2018), Disp: 30 capsule, Rfl: 3 .  OneTouch Delica Lancets 29M MISC, Use as instructed to check blood sugar 3 times daily. E11.319 Z79.4, Disp: 100 each, Rfl: 11 .  SENNA PLUS 8.6-50 MG tablet, Take 1 tablet by mouth daily., Disp: 30 tablet, Rfl: 2 .  amLODipine (NORVASC) 10 MG tablet, Take 1 tablet (10  mg total) by mouth daily., Disp: 30 tablet, Rfl: 2 .  dapagliflozin propanediol (FARXIGA) 5 MG TABS tablet, Take 5 mg by mouth daily., Disp: 30 tablet, Rfl: 2 .  Dulaglutide (TRULICITY) 7.87 ZU/3.6DQ SOPN, Inject 0.75 mg into the skin once a week., Disp: 2 mL, Rfl: 2 .  hydrALAZINE (APRESOLINE) 25 MG tablet, TAKE 1 TABLET BY MOUTH 3 TIMES DAILY, Disp: 90 tablet, Rfl: 1  Social History   Tobacco Use  Smoking Status Former Smoker  . Quit date: 09/05/1994  . Years since quitting: 24.3  Smokeless Tobacco Never Used    Allergies  Allergen Reactions  . Cozaar [Losartan Potassium]     Hives   . Losartan Potassium-Hctz Hives   Objective:   There were no vitals filed  for this visit. There is no height or weight on file to calculate BMI. Constitutional Well developed. Well nourished.  Vascular Dorsalis pedis pulses present 1+ bilaterally  Posterior tibial pulses absent bilaterally  Pedal hair growth diminished. Capillary refill normal to all digits.  No cyanosis or clubbing noted.  Neurologic Normal speech. Oriented to person, place, and time. Epicritic sensation to light touch grossly present bilaterally. Protective sensation with 5.07 monofilament  present bilaterally. Vibratory sensation present bilaterally.  Dermatologic Nails elongated, thickened, dystrophic. No open wounds. HPK lateral 5th Met base  Orthopedic: Normal joint ROM without pain or crepitus bilaterally. No visible deformities. No bony tenderness.   Assessment:   1. Onychomycosis of multiple toenails with type 2 diabetes mellitus and peripheral angiopathy ([place])    Plan:  Patient was evaluated and treated and all questions answered.  Diabetes with PAD, Onychomycosis -Discussed it is too early for routine foot care -Callus debrided courtesy -F/u as scheduled for routine foot care.  -Pending DM shoes  No follow-ups on file.

## 2019-01-28 ENCOUNTER — Other Ambulatory Visit: Payer: BC Managed Care – PPO | Admitting: Orthotics

## 2019-02-02 ENCOUNTER — Other Ambulatory Visit: Payer: BC Managed Care – PPO | Admitting: Orthotics

## 2019-02-04 ENCOUNTER — Other Ambulatory Visit: Payer: Self-pay | Admitting: Critical Care Medicine

## 2019-02-05 ENCOUNTER — Other Ambulatory Visit: Payer: Self-pay | Admitting: Critical Care Medicine

## 2019-02-05 DIAGNOSIS — I10 Essential (primary) hypertension: Secondary | ICD-10-CM

## 2019-02-09 ENCOUNTER — Other Ambulatory Visit: Payer: Self-pay | Admitting: Pharmacist

## 2019-02-09 DIAGNOSIS — I10 Essential (primary) hypertension: Secondary | ICD-10-CM

## 2019-02-09 DIAGNOSIS — M1A4710 Other secondary chronic gout, right ankle and foot, without tophus (tophi): Secondary | ICD-10-CM

## 2019-02-09 MED ORDER — CHLORTHALIDONE 25 MG PO TABS: 25.0000 mg | ORAL_TABLET | Freq: Every day | ORAL | 2 refills | Status: DC

## 2019-02-16 ENCOUNTER — Ambulatory Visit: Payer: Self-pay | Admitting: Surgery

## 2019-02-26 ENCOUNTER — Other Ambulatory Visit: Payer: Self-pay

## 2019-02-26 ENCOUNTER — Ambulatory Visit: Payer: BC Managed Care – PPO | Admitting: Podiatry

## 2019-02-26 ENCOUNTER — Other Ambulatory Visit: Payer: Self-pay | Admitting: Critical Care Medicine

## 2019-02-26 ENCOUNTER — Other Ambulatory Visit: Payer: BC Managed Care – PPO | Admitting: Orthotics

## 2019-02-26 DIAGNOSIS — B351 Tinea unguium: Secondary | ICD-10-CM

## 2019-02-26 DIAGNOSIS — L84 Corns and callosities: Secondary | ICD-10-CM | POA: Diagnosis not present

## 2019-02-26 DIAGNOSIS — E1169 Type 2 diabetes mellitus with other specified complication: Secondary | ICD-10-CM

## 2019-02-26 DIAGNOSIS — E1151 Type 2 diabetes mellitus with diabetic peripheral angiopathy without gangrene: Secondary | ICD-10-CM

## 2019-02-26 DIAGNOSIS — I10 Essential (primary) hypertension: Secondary | ICD-10-CM

## 2019-03-05 ENCOUNTER — Ambulatory Visit: Payer: BC Managed Care – PPO | Admitting: Podiatry

## 2019-03-07 NOTE — Progress Notes (Signed)
Subjective:    Patient ID: Justin Houston, male    DOB: [birth year], 53 y.o.   MRN: [id number]  This is a 53 year old male seen in f/u for T2DM, back pain, GERD, varicose veins.   The patient has been seen by clinical pharmacist with medication adjustments.  The pt was referred to podiatry and was seen.   Gen Surgery made but not yet occurred for umbilical hernia.  No dyspnea, pt is moving more.  CBGs much improved 90-100 fasting up to 120   Wt Readings from Last 3 Encounters: 11/02/18 : (!) 318 lb (144.2 kg) 08/31/18 : (!) 316 lb (143.3 kg) 11/28/17 : (!) 326 lb (147.9 kg)   11/23  This patient was last seen in July and at that visit we continued amlodipine 10 mg daily atenolol 50 mg daily chlorthalidone daily and hydralazine 3 times daily the patient's blood pressure remains quite labile since that time.  Also the patient has been seen by vascular surgery for varicose veins and they have prescribed thigh-high support hose.  The patient's been compliant with his diabetic program to include the Levemir Trulicity and Metformin along with [place].  His hemoglobin A1c today was 5.6 which is a significant reduction  We did refer the patient to general surgery to treat his umbilical hernia and lipoma on the head unfortunately he cannot afford the co-pay for the surgical procedure despite his insurance coverage.  We tried to find various methods to obtain support for this patient but have met many barriers.  We are attempting now to see if we can get financial discount for this patient despite his insurance status  Wt Readings from Last 3 Encounters: 03/08/19 : (!) 325 lb (147.4 kg) 12/31/18 : (!) 316 lb 9.6 oz (143.6 kg) 11/25/18 : (!) 319 lb 12.8 oz (145.1 kg)   Past Medical History:  Diagnosis Date  . Dermatitis   . Diabetes mellitus   . GERD (gastroesophageal reflux disease)   . Hyperlipidemia   . Hypertension   . Lipoma of scalp   . Obesity   . Toenail fungus 08/31/2018      Family History  Problem Relation Age of Onset  . Stroke Mother   . Hypertension Mother   . Diabetes Maternal Grandmother   . Prostate cancer Maternal Grandfather   . Diabetes Other      Social History   Socioeconomic History  . Marital status: Single    Spouse name: n/a  . Number of children: 0  . Years of education: 38  . Highest education level: Not on file  Occupational History  . Occupation: Software engineer: [employer]  Social Needs  . Financial resource strain: Not on file  . Food insecurity    Worry: Not on file    Inability: Not on file  . Transportation needs    Medical: Not on file    Non-medical: Not on file  Tobacco Use  . Smoking status: Former Smoker    Quit date: 09/05/1994    Years since quitting: 24.5  . Smokeless tobacco: Never Used  Substance and Sexual Activity  . Alcohol use: No    Alcohol/week: 0.0 standard drinks  . Drug use: No  . Sexual activity: Never  Lifestyle  . Physical activity    Days per week: Not on file    Minutes per session: Not on file  . Stress: Not on file  Relationships  . Social connections  Talks on phone: Not on file    Gets together: Not on file    Attends religious service: Not on file    Active member of club or organization: Not on file    Attends meetings of clubs or organizations: Not on file    Relationship status: Not on file  . Intimate partner violence    Fear of current or ex partner: Not on file    Emotionally abused: Not on file    Physically abused: Not on file    Forced sexual activity: Not on file  Other Topics Concern  . Not on file  Social History Narrative   Master's Degree in Adult Education.  Lives alone.   No siblings.     Allergies  Allergen Reactions  . Cozaar [Losartan Potassium]     Hives   . Losartan Potassium-Hctz Hives     Outpatient Medications Prior to Visit  Medication Sig Dispense Refill  . ALPHAGAN P 0.1 % SOLN Place 1 drop into both  eyes 2 (two) times daily. 15 mL 1  . amLODipine (NORVASC) 10 MG tablet Take 1 tablet (10 mg total) by mouth daily. 30 tablet 2  . atorvastatin (LIPITOR) 40 MG tablet TAKE 1 TABLET BY MOUTH EVERY DAY 30 tablet 3  . B-D INS SYR ULTRAFINE 1CC/31G 31G X 5/16" 1 ML MISC USE AS DIRECTED 100 each 0  . B-D UF III MINI PEN NEEDLES 31G X 5 MM MISC USE 1 DAILY    . Blood Glucose Monitoring Suppl (ONETOUCH VERIO) w/Device KIT Use as instructed to check blood sugar 3 times daily. E11.319 Z79.4 1 kit 0  . clobetasol ointment (TEMOVATE) 1.96 % Apply 1 application topically 3 (three) times daily as needed (rash.). 60 g 1  . clotrimazole-betamethasone (LOTRISONE) cream Apply 1 application topically 2 (two) times daily. 30 g 3  . colchicine 0.6 MG tablet Take 1 tablet (0.6 mg total) by mouth 2 (two) times daily as needed. 20 tablet 1  . glucose blood (ONETOUCH VERIO) test strip Use as instructed to check blood sugar 3 times daily. E11.319 Z79.4 100 each 11  . hydrALAZINE (APRESOLINE) 25 MG tablet TAKE 1 TABLET BY MOUTH 3 TIMES DAILY 90 tablet 1  . insulin detemir (LEVEMIR) 100 UNIT/ML injection Inject 0.4 mLs (40 Units total) into the skin 2 (two) times daily. 20 mL 2  . Insulin Pen Needle 30G X 5 MM MISC 1 Stick by Does not apply route daily. 100 each 1  . OneTouch Delica Lancets 22W MISC Use as instructed to check blood sugar 3 times daily. E11.319 Z79.4 100 each 11  . atenolol (TENORMIN) 50 MG tablet TAKE 1 TABLET BY MOUTH EVERY DAY 90 tablet 0  . chlorthalidone (HYGROTON) 25 MG tablet Take 1 tablet (25 mg total) by mouth daily. 30 tablet 2  . dapagliflozin propanediol (FARXIGA) 5 MG TABS tablet Take 5 mg by mouth daily. 30 tablet 2  . Dulaglutide (TRULICITY) 9.79 GX/2.1JH SOPN Inject 0.75 mg into the skin once a week. 2 mL 2  . metFORMIN (GLUCOPHAGE) 1000 MG tablet Take 1 tablet (1,000 mg total) by mouth 2 (two) times daily with a meal. 60 tablet 0  . omeprazole (PRILOSEC) 20 MG capsule TAKE 1 CAPSULE BY MOUTH  EVERY DAY 60 capsule 0  . SENNA PLUS 8.6-50 MG tablet TAKE 1 TABLET BY MOUTH DAILY 30 tablet 2  . naproxen sodium (ALEVE) 220 MG tablet Take 2 tablets (440 mg total) by mouth 2 (two) times  daily as needed (pain). (Patient not taking: Reported on 03/08/2019) 180 tablet 0   No facility-administered medications prior to visit.    Review of Systems  Constitutional: Negative.  Negative for fever.  HENT: Negative.   Eyes: Positive for visual disturbance.  Respiratory: Negative for cough, choking, chest tightness, shortness of breath and wheezing.   Cardiovascular: Positive for leg swelling. Negative for chest pain and palpitations.       Varicose veins  Gastrointestinal: Positive for constipation. Negative for abdominal distention, abdominal pain, anal bleeding, blood in stool, diarrhea, nausea, rectal pain and vomiting.  Endocrine: Negative.   Genitourinary: Negative.   Skin: Negative for rash.  Neurological: Negative.   Hematological: Negative.   Psychiatric/Behavioral: Negative.        Objective:   Physical Exam Vitals:   03/08/19 0938  BP: (!) 162/77  Pulse: 74  Temp: 99.2 F (37.3 C)  TempSrc: Oral  SpO2: 97%  Weight: (!) 325 lb (147.4 kg)  Height: _0  (1.803 m)   Wt Readings from Last 3 Encounters:  03/08/19 (!) 325 lb (147.4 kg)  12/31/18 (!) 316 lb 9.6 oz (143.6 kg)  11/25/18 (!) 319 lb 12.8 oz (145.1 kg)   Gen: Pleasant, obese, in no distress,  normal affect  ENT: No lesions,  mouth clear,  oropharynx clear, no postnasal drip, lipomatous lesion R posterior skull  Neck: No JVD, no TMG, no carotid bruits  Lungs: No use of accessory muscles, no dullness to percussion, clear without rales or rhonchi  Cardiovascular: RRR, heart sounds normal, no murmur or gallops, no peripheral edema, severe varicose veins  Abdomen: soft and NT, no HSM,  BS normal, umbilical hernia with part of bowel in hernia, this is reduceable  Musculoskeletal: No deformities, no cyanosis or  clubbing   Neuro: alert, non focal  Skin: Warm, no lesions or rashes  BMP Latest Ref Rng & Units 10/07/2018 07/12/2017 02/11/2017  Glucose 65 - 99 mg/dL 159(H) 220(H) 298(H)  BUN 6 - 24 mg/dL _1 Creatinine 0.76 - 1.27 mg/dL 1.08 0.98 1.17  BUN/Creat Ratio 9 - _2 Sodium 134 - 144 mmol/L 144 138 137  Potassium 3.5 - 5.2 mmol/L 3.4(L) 3.9 4.0  Chloride 96 - 106 mmol/L 101 100 95(L)  CO2 20 - 29 mmol/L _3 Calcium 8.7 - 10.2 mg/dL 9.6 8.9 9.8   Hepatic Function Panel     Component Value Date/Time   PROT 7.0 10/07/2018 1146   ALBUMIN 4.0 10/07/2018 1146   AST 18 10/07/2018 1146   ALT 21 10/07/2018 1146   ALKPHOS 95 10/07/2018 1146   BILITOT 1.1 10/07/2018 1146   CBC Latest Ref Rng & Units 07/12/2017 02/11/2017 08/16/2016  WBC 3.4 - 10.8 x10E3/uL 5.1 5.9 5.7  Hemoglobin 13.0 - 17.7 g/dL 15.4 16.2 15.2  Hematocrit 37.5 - 51.0 % 45.9 47.5 45.4  Platelets 150 - 379 x10E3/uL 179 198 184   A1C 5.6  CBG 151    Assessment & Plan:  I personally reviewed all images and lab data in the [facility] system as well as any outside material available during this office visit and agree with the  radiology impressions.   Hypertension Hypertension under variable control for now continue current medication program  Varicose veins of both legs with edema Varicose legs in both lower extremities for this we will use thigh-high TED hose per vascular surgery  Diabetes mellitus type 2 with retinopathy ([place]) Type 2 diabetes with improved  control and underlying retinopathy  Patient's encouraged to follow-up with ophthalmology  Hyperlipidemia associated with type 2 diabetes mellitus ([place]) LDL appears to be at goal we will continue current program  Toenail fungus The patient did see podiatry and his toenail fungus is resolved  Mass of head, right postauricular SQ 7x4cm Mass on head appears to be that of a lipoma awaiting surgical resection once he can achieve his co-pay  Umbilical  hernia without obstruction and without gangrene Umbilical hernia which will need to hold off on surgical intervention because he cannot afford his co-pay   Iseah was seen today for follow-up.  Diagnoses and all orders for this visit:  Diabetes mellitus type 2 in obese (HCC) -     Glucose (CBG) -     HgB A1c -     dapagliflozin propanediol (FARXIGA) 5 MG TABS tablet; Take 5 mg by mouth daily. -     Dulaglutide (TRULICITY) 7.47 FT/9.5ZX SOPN; Inject 0.75 mg into the skin once a week. -     metFORMIN (GLUCOPHAGE) 1000 MG tablet; Take 1 tablet (1,000 mg total) by mouth 2 (two) times daily with a meal. -     Comprehensive metabolic panel  Essential hypertension -     atenolol (TENORMIN) 50 MG tablet; Take 1 tablet (50 mg total) by mouth daily. -     chlorthalidone (HYGROTON) 25 MG tablet; Take 1 tablet (25 mg total) by mouth daily.  Varicose veins of both legs with edema  Gastroesophageal reflux disease without esophagitis  Lipoma of other specified sites  Hyperlipidemia associated with type 2 diabetes mellitus (HCC)  Moderate nonproliferative diabetic retinopathy without macular edema associated with type 2 diabetes mellitus, unspecified laterality (HCC)  BMI 45.0-49.9, adult (HCC)  Mass of head, right postauricular SQ 7x4cm  Type 2 diabetes mellitus with retinopathy, with long-term current use of insulin, macular edema presence unspecified, unspecified laterality, unspecified retinopathy severity ([place])  Toenail fungus  Umbilical hernia without obstruction and without gangrene  Other orders -     senna-docusate (SENNA PLUS) 8.6-50 MG tablet; Take 1 tablet by mouth daily. -     omeprazole (PRILOSEC) 20 MG capsule; TAKE 1 CAPSULE BY MOUTH EVERY DAY

## 2019-03-08 ENCOUNTER — Other Ambulatory Visit: Payer: Self-pay

## 2019-03-08 ENCOUNTER — Ambulatory Visit: Payer: BC Managed Care – PPO | Attending: Critical Care Medicine | Admitting: Critical Care Medicine

## 2019-03-08 ENCOUNTER — Encounter: Payer: Self-pay | Admitting: Critical Care Medicine

## 2019-03-08 VITALS — BP 162/77 | HR 74 | Temp 99.2°F | Ht 71.0 in | Wt 325.0 lb

## 2019-03-08 DIAGNOSIS — Z794 Long term (current) use of insulin: Secondary | ICD-10-CM | POA: Diagnosis not present

## 2019-03-08 DIAGNOSIS — I10 Essential (primary) hypertension: Secondary | ICD-10-CM | POA: Diagnosis not present

## 2019-03-08 DIAGNOSIS — E11319 Type 2 diabetes mellitus with unspecified diabetic retinopathy without macular edema: Secondary | ICD-10-CM

## 2019-03-08 DIAGNOSIS — Z6841 Body Mass Index (BMI) 40.0 and over, adult: Secondary | ICD-10-CM

## 2019-03-08 DIAGNOSIS — B351 Tinea unguium: Secondary | ICD-10-CM

## 2019-03-08 DIAGNOSIS — I83893 Varicose veins of bilateral lower extremities with other complications: Secondary | ICD-10-CM | POA: Diagnosis not present

## 2019-03-08 DIAGNOSIS — K219 Gastro-esophageal reflux disease without esophagitis: Secondary | ICD-10-CM

## 2019-03-08 DIAGNOSIS — E785 Hyperlipidemia, unspecified: Secondary | ICD-10-CM

## 2019-03-08 DIAGNOSIS — E113399 Type 2 diabetes mellitus with moderate nonproliferative diabetic retinopathy without macular edema, unspecified eye: Secondary | ICD-10-CM | POA: Diagnosis not present

## 2019-03-08 DIAGNOSIS — E669 Obesity, unspecified: Secondary | ICD-10-CM

## 2019-03-08 DIAGNOSIS — K429 Umbilical hernia without obstruction or gangrene: Secondary | ICD-10-CM

## 2019-03-08 DIAGNOSIS — R220 Localized swelling, mass and lump, head: Secondary | ICD-10-CM

## 2019-03-08 DIAGNOSIS — D1779 Benign lipomatous neoplasm of other sites: Secondary | ICD-10-CM

## 2019-03-08 DIAGNOSIS — E1169 Type 2 diabetes mellitus with other specified complication: Secondary | ICD-10-CM

## 2019-03-08 LAB — POCT GLYCOSYLATED HEMOGLOBIN (HGB A1C): Hemoglobin A1C: 5.6 % (ref 4.0–5.6)

## 2019-03-08 LAB — GLUCOSE, POCT (MANUAL RESULT ENTRY): POC Glucose: 151 mg/dl — AB (ref 70–99)

## 2019-03-08 MED ORDER — SENNOSIDES-DOCUSATE SODIUM 8.6-50 MG PO TABS: 1.0000 | ORAL_TABLET | Freq: Every day | ORAL | 2 refills | Status: DC

## 2019-03-08 MED ORDER — OMEPRAZOLE 20 MG PO CPDR: DELAYED_RELEASE_CAPSULE | ORAL | 3 refills | Status: DC

## 2019-03-08 MED ORDER — METFORMIN HCL 1000 MG PO TABS: 1000.0000 mg | ORAL_TABLET | Freq: Two times a day (BID) | ORAL | 3 refills | Status: DC

## 2019-03-08 MED ORDER — FARXIGA 5 MG PO TABS: 5.0000 mg | ORAL_TABLET | Freq: Every day | ORAL | 2 refills | Status: DC

## 2019-03-08 MED ORDER — TRULICITY 0.75 MG/0.5ML ~~LOC~~ SOAJ: 0.7500 mg | SUBCUTANEOUS | 2 refills | Status: DC

## 2019-03-08 MED ORDER — CHLORTHALIDONE 25 MG PO TABS: 25.0000 mg | ORAL_TABLET | Freq: Every day | ORAL | 2 refills | Status: DC

## 2019-03-08 MED ORDER — ATENOLOL 50 MG PO TABS: 50.0000 mg | ORAL_TABLET | Freq: Every day | ORAL | 0 refills | Status: DC

## 2019-03-08 MED FILL — OMEPRAZOLE 20 MG CAP: 20 | 30 days supply | Qty: 30 | Fill #0

## 2019-03-08 MED FILL — TRULICITY 0.75 MG/0.5 ML PE: 0.75 | 28 days supply | Qty: 2 | Fill #0

## 2019-03-08 MED FILL — ATENOLOL 50 MG TABLET: 50 | 30 days supply | Qty: 30 | Fill #0

## 2019-03-08 MED FILL — FARXIGA 5 MG TABLET: 5 | 30 days supply | Qty: 30 | Fill #0

## 2019-03-08 MED FILL — CHLORTHALIDONE 25 MG TAB: 25 | 30 days supply | Qty: 30 | Fill #0

## 2019-03-08 MED FILL — metFORMIN HCL 1000 MG TABS: 1000 | 30 days supply | Qty: 60 | Fill #0

## 2019-03-08 NOTE — Assessment & Plan Note (Signed)
LDL appears to be at goal we will continue current program

## 2019-03-08 NOTE — Assessment & Plan Note (Signed)
Umbilical hernia which will need to hold off on surgical intervention because he cannot afford his co-pay

## 2019-03-08 NOTE — Assessment & Plan Note (Signed)
The patient did see podiatry and his toenail fungus is resolved

## 2019-03-08 NOTE — Assessment & Plan Note (Signed)
Varicose legs in both lower extremities for this we will use thigh-high TED hose per vascular surgery

## 2019-03-08 NOTE — Assessment & Plan Note (Signed)
Type 2 diabetes with improved control and underlying retinopathy  Patient's encouraged to follow-up with ophthalmology

## 2019-03-08 NOTE — Assessment & Plan Note (Signed)
Hypertension under variable control for now continue current medication program

## 2019-03-08 NOTE — Assessment & Plan Note (Signed)
Mass on head appears to be that of a lipoma awaiting surgical resection once he can achieve his co-pay

## 2019-03-08 NOTE — Patient Instructions (Addendum)
No change in medications and refills sent to our pharmacy  Please remember to have your eyes checked by an ophthalmology optometry provider for your diabetes  Labs today include a complete metabolic panel  Return to see Dr. Joya Gaskins follow-up 3 months

## 2019-03-09 LAB — COMPREHENSIVE METABOLIC PANEL
ALT: 12 IU/L (ref 0–44)
AST: 17 IU/L (ref 0–40)
Albumin/Globulin Ratio: 1.3 (ref 1.2–2.2)
Albumin: 4.1 g/dL (ref 3.8–4.9)
Alkaline Phosphatase: 101 IU/L (ref 39–117)
BUN/Creatinine Ratio: 16 (ref 9–20)
BUN: 18 mg/dL (ref 6–24)
Bilirubin Total: 1.1 mg/dL (ref 0.0–1.2)
CO2: 21 mmol/L (ref 20–29)
Calcium: 9.5 mg/dL (ref 8.7–10.2)
Chloride: 101 mmol/L (ref 96–106)
Creatinine, Ser: 1.14 mg/dL (ref 0.76–1.27)
GFR calc Af Amer: 84 mL/min/{1.73_m2} (ref >59)
GFR calc non Af Amer: 73 mL/min/{1.73_m2} (ref >59)
Globulin, Total: 3.1 g/dL (ref 1.5–4.5)
Glucose: 126 mg/dL — ABNORMAL HIGH (ref 65–99)
Potassium: 4.1 mmol/L (ref 3.5–5.2)
Sodium: 140 mmol/L (ref 134–144)
Total Protein: 7.2 g/dL (ref 6.0–8.5)

## 2019-03-22 ENCOUNTER — Encounter: Payer: BC Managed Care – PPO | Admitting: Orthotics

## 2019-03-30 ENCOUNTER — Other Ambulatory Visit: Payer: Self-pay | Admitting: Critical Care Medicine

## 2019-03-30 DIAGNOSIS — I10 Essential (primary) hypertension: Secondary | ICD-10-CM

## 2019-04-13 ENCOUNTER — Telehealth: Payer: Self-pay | Admitting: Critical Care Medicine

## 2019-04-13 MED ORDER — INSULIN PEN NEEDLE 30G X 5 MM MISC: 1.0000 | Freq: Every day | 11 refills | Status: DC

## 2019-04-13 MED ORDER — "INSULIN SYRINGE-NEEDLE U-100 31G X 5/16"" 1 ML MISC": 11 refills | Status: DC

## 2019-04-13 NOTE — Telephone Encounter (Signed)
1) Medication(s) Requested (by name):Insulin Pen Needle 30G X 5 MM MISC [id number] Please make sure its can hold 80 units  2) [facility] [place] - [address]  [address], [place] [address]   3) Special Requests:   Approved medications will be sent to the pharmacy, we will reach out if there is an issue.  Requests made after 3pm may not be addressed until the following business day!  If a patient is unsure of the name of the medication(s) please note and ask patient to call back when they are able to provide all info, do not send to responsible party until all information is available!

## 2019-04-13 NOTE — Telephone Encounter (Signed)
Rx sent 

## 2019-05-08 ENCOUNTER — Other Ambulatory Visit: Payer: Self-pay | Admitting: Critical Care Medicine

## 2019-05-08 DIAGNOSIS — I10 Essential (primary) hypertension: Secondary | ICD-10-CM

## 2019-05-20 ENCOUNTER — Other Ambulatory Visit: Payer: Self-pay | Admitting: Critical Care Medicine

## 2019-05-20 DIAGNOSIS — E1169 Type 2 diabetes mellitus with other specified complication: Secondary | ICD-10-CM

## 2019-05-27 ENCOUNTER — Encounter: Payer: BC Managed Care – PPO | Admitting: Orthotics

## 2019-05-27 ENCOUNTER — Ambulatory Visit: Payer: BC Managed Care – PPO | Admitting: Podiatry

## 2019-06-04 ENCOUNTER — Other Ambulatory Visit: Payer: Self-pay | Admitting: Critical Care Medicine

## 2019-06-04 DIAGNOSIS — I10 Essential (primary) hypertension: Secondary | ICD-10-CM

## 2019-06-08 ENCOUNTER — Ambulatory Visit: Payer: BC Managed Care – PPO | Admitting: Critical Care Medicine

## 2019-06-10 ENCOUNTER — Other Ambulatory Visit: Payer: Self-pay | Admitting: Critical Care Medicine

## 2019-06-10 DIAGNOSIS — I10 Essential (primary) hypertension: Secondary | ICD-10-CM

## 2019-06-10 DIAGNOSIS — E1169 Type 2 diabetes mellitus with other specified complication: Secondary | ICD-10-CM

## 2019-06-10 DIAGNOSIS — E669 Obesity, unspecified: Secondary | ICD-10-CM

## 2019-06-12 ENCOUNTER — Other Ambulatory Visit: Payer: Self-pay | Admitting: Critical Care Medicine

## 2019-06-12 DIAGNOSIS — I10 Essential (primary) hypertension: Secondary | ICD-10-CM

## 2019-07-01 ENCOUNTER — Other Ambulatory Visit: Payer: Self-pay | Admitting: Critical Care Medicine

## 2019-07-01 DIAGNOSIS — E785 Hyperlipidemia, unspecified: Secondary | ICD-10-CM

## 2019-07-12 ENCOUNTER — Other Ambulatory Visit: Payer: Self-pay | Admitting: Critical Care Medicine

## 2019-07-12 DIAGNOSIS — I10 Essential (primary) hypertension: Secondary | ICD-10-CM

## 2019-07-13 NOTE — Telephone Encounter (Signed)
Patient called and requested for listed medication to be refilled and sent to [facility] [address]. Please follow up at your earliest convenience.  atenolol (TENORMIN) 50 MG tablet [id number]

## 2019-07-14 NOTE — Telephone Encounter (Signed)
Called patient, no answer, lvm informing patient of refilled medication and to make an office visit for further refills.

## 2019-07-20 ENCOUNTER — Other Ambulatory Visit: Payer: Self-pay | Admitting: Critical Care Medicine

## 2019-07-20 DIAGNOSIS — I10 Essential (primary) hypertension: Secondary | ICD-10-CM

## 2019-08-11 ENCOUNTER — Other Ambulatory Visit: Payer: Self-pay | Admitting: Critical Care Medicine

## 2019-08-11 DIAGNOSIS — I10 Essential (primary) hypertension: Secondary | ICD-10-CM

## 2019-09-08 ENCOUNTER — Other Ambulatory Visit: Payer: Self-pay | Admitting: Critical Care Medicine

## 2019-09-08 DIAGNOSIS — I10 Essential (primary) hypertension: Secondary | ICD-10-CM

## 2019-09-13 NOTE — Congregational Nurse Program (Signed)
Made an appointment with Dr. Joya Gaskins on 6/22 2p at [facility] for pre-op eval. Made client aware

## 2019-09-17 ENCOUNTER — Other Ambulatory Visit: Payer: Self-pay | Admitting: Critical Care Medicine

## 2019-09-17 DIAGNOSIS — E1169 Type 2 diabetes mellitus with other specified complication: Secondary | ICD-10-CM

## 2019-09-17 DIAGNOSIS — E785 Hyperlipidemia, unspecified: Secondary | ICD-10-CM

## 2019-09-17 DIAGNOSIS — I10 Essential (primary) hypertension: Secondary | ICD-10-CM

## 2019-09-17 MED ORDER — ATORVASTATIN CALCIUM 40 MG PO TABS: 40.0000 mg | ORAL_TABLET | Freq: Every day | ORAL | 3 refills | Status: DC

## 2019-09-17 MED ORDER — INSULIN DETEMIR 100 UNIT/ML ~~LOC~~ SOLN: SUBCUTANEOUS | 2 refills | Status: DC

## 2019-09-17 MED ORDER — METFORMIN HCL 1000 MG PO TABS: ORAL_TABLET | ORAL | 2 refills | Status: DC

## 2019-09-17 MED ORDER — AMLODIPINE BESYLATE 10 MG PO TABS: 10.0000 mg | ORAL_TABLET | Freq: Every day | ORAL | 2 refills | Status: DC

## 2019-09-17 MED ORDER — ATENOLOL 50 MG PO TABS: ORAL_TABLET | ORAL | 0 refills | Status: DC

## 2019-09-17 MED ORDER — CHLORTHALIDONE 25 MG PO TABS: 25.0000 mg | ORAL_TABLET | Freq: Every day | ORAL | 2 refills | Status: DC

## 2019-09-17 MED ORDER — TRULICITY 0.75 MG/0.5ML ~~LOC~~ SOAJ: 0.7500 mg | SUBCUTANEOUS | 0 refills | Status: DC

## 2019-09-20 ENCOUNTER — Other Ambulatory Visit: Payer: Self-pay | Admitting: Critical Care Medicine

## 2019-09-20 DIAGNOSIS — E1169 Type 2 diabetes mellitus with other specified complication: Secondary | ICD-10-CM

## 2019-10-05 ENCOUNTER — Encounter: Payer: Self-pay | Admitting: Critical Care Medicine

## 2019-10-05 ENCOUNTER — Ambulatory Visit: Payer: BC Managed Care – PPO | Attending: Critical Care Medicine | Admitting: Critical Care Medicine

## 2019-10-05 ENCOUNTER — Other Ambulatory Visit: Payer: Self-pay

## 2019-10-05 VITALS — BP 181/82 | HR 67 | Temp 98.0°F | Resp 16 | Ht 73.0 in | Wt 334.0 lb

## 2019-10-05 DIAGNOSIS — E785 Hyperlipidemia, unspecified: Secondary | ICD-10-CM

## 2019-10-05 DIAGNOSIS — I10 Essential (primary) hypertension: Secondary | ICD-10-CM | POA: Diagnosis not present

## 2019-10-05 DIAGNOSIS — E1169 Type 2 diabetes mellitus with other specified complication: Secondary | ICD-10-CM | POA: Diagnosis not present

## 2019-10-05 DIAGNOSIS — M1A4710 Other secondary chronic gout, right ankle and foot, without tophus (tophi): Secondary | ICD-10-CM

## 2019-10-05 DIAGNOSIS — E669 Obesity, unspecified: Secondary | ICD-10-CM

## 2019-10-05 DIAGNOSIS — R220 Localized swelling, mass and lump, head: Secondary | ICD-10-CM

## 2019-10-05 DIAGNOSIS — Z6841 Body Mass Index (BMI) 40.0 and over, adult: Secondary | ICD-10-CM

## 2019-10-05 DIAGNOSIS — D1779 Benign lipomatous neoplasm of other sites: Secondary | ICD-10-CM

## 2019-10-05 DIAGNOSIS — Z794 Long term (current) use of insulin: Secondary | ICD-10-CM

## 2019-10-05 DIAGNOSIS — L309 Dermatitis, unspecified: Secondary | ICD-10-CM

## 2019-10-05 DIAGNOSIS — E119 Type 2 diabetes mellitus without complications: Secondary | ICD-10-CM

## 2019-10-05 DIAGNOSIS — E11319 Type 2 diabetes mellitus with unspecified diabetic retinopathy without macular edema: Secondary | ICD-10-CM | POA: Diagnosis not present

## 2019-10-05 DIAGNOSIS — Z1159 Encounter for screening for other viral diseases: Secondary | ICD-10-CM

## 2019-10-05 DIAGNOSIS — K429 Umbilical hernia without obstruction or gangrene: Secondary | ICD-10-CM

## 2019-10-05 LAB — POCT GLYCOSYLATED HEMOGLOBIN (HGB A1C): Hemoglobin A1C: 6.1 % — AB (ref 4.0–5.6)

## 2019-10-05 LAB — GLUCOSE, POCT (MANUAL RESULT ENTRY): POC Glucose: 89 mg/dl (ref 70–99)

## 2019-10-05 MED ORDER — ATENOLOL 50 MG PO TABS: ORAL_TABLET | ORAL | 3 refills | Status: DC

## 2019-10-05 MED ORDER — TRULICITY 0.75 MG/0.5ML ~~LOC~~ SOAJ: 0.7500 mg | SUBCUTANEOUS | 1 refills | Status: DC

## 2019-10-05 MED ORDER — CHLORTHALIDONE 25 MG PO TABS: 25.0000 mg | ORAL_TABLET | Freq: Every day | ORAL | 2 refills | Status: DC

## 2019-10-05 MED ORDER — ATORVASTATIN CALCIUM 40 MG PO TABS: 40.0000 mg | ORAL_TABLET | Freq: Every day | ORAL | 3 refills | Status: DC

## 2019-10-05 MED ORDER — ALPHAGAN P 0.1 % OP SOLN: 1.0000 [drp] | Freq: Two times a day (BID) | OPHTHALMIC | 1 refills | Status: DC

## 2019-10-05 MED ORDER — AMLODIPINE BESYLATE 10 MG PO TABS: 10.0000 mg | ORAL_TABLET | Freq: Every day | ORAL | 2 refills | Status: DC

## 2019-10-05 MED ORDER — HYDRALAZINE HCL 50 MG PO TABS: 50.0000 mg | ORAL_TABLET | Freq: Three times a day (TID) | ORAL | 3 refills | Status: DC

## 2019-10-05 MED ORDER — CLOTRIMAZOLE-BETAMETHASONE 1-0.05 % EX CREA: 1.0000 "application " | TOPICAL_CREAM | Freq: Two times a day (BID) | CUTANEOUS | 3 refills | Status: DC

## 2019-10-05 MED ORDER — COLCHICINE 0.6 MG PO TABS: 0.6000 mg | ORAL_TABLET | Freq: Two times a day (BID) | ORAL | 1 refills | Status: DC | PRN

## 2019-10-05 MED ORDER — DAPAGLIFLOZIN PROPANEDIOL 5 MG PO TABS: 5.0000 mg | ORAL_TABLET | Freq: Every day | ORAL | 2 refills | Status: DC

## 2019-10-05 NOTE — Assessment & Plan Note (Signed)
Severe obesity contributing to the umbilical hernia BMI is still at 44.07  Nutritional dietary measures were recommended to this patient

## 2019-10-05 NOTE — Assessment & Plan Note (Signed)
The hernia has been present for a considerable period of time and is not currently symptomatic for this patient  I am wondering if we can proceed with a lipoma removal and hold off on the hernia repair at a later date to keep his cost down

## 2019-10-05 NOTE — Progress Notes (Signed)
Subjective:    Patient ID: Justin Houston, male    DOB: 1966/03/07, 54 y.o.   MRN: [id number]  This is a 54 year old male seen in f/u for T2DM, back pain, GERD, varicose veins.   The patient has been seen by clinical pharmacist with medication adjustments.  The pt was referred to podiatry and was seen.   Gen Surgery made but not yet occurred for umbilical hernia.  No dyspnea, pt is moving more.  CBGs much improved 90-100 fasting up to 120   Wt Readings from Last 3 Encounters: 11/02/18 : (!) 318 lb (144.2 kg) 08/31/18 : (!) 316 lb (143.3 kg) 11/28/17 : (!) 326 lb (147.9 kg)   11/23  This patient was last seen in July and at that visit we continued amlodipine 10 mg daily atenolol 50 mg daily chlorthalidone daily and hydralazine 3 times daily the patient's blood pressure remains quite labile since that time.  Also the patient has been seen by vascular surgery for varicose veins and they have prescribed thigh-high support hose.  The patient's been compliant with his diabetic program to include the Levemir Trulicity and Metformin along with [place].  His hemoglobin A1c today was 5.6 which is a significant reduction  We did refer the patient to general surgery to treat his umbilical hernia and lipoma on the head unfortunately he cannot afford the co-pay for the surgical procedure despite his insurance coverage.  We tried to find various methods to obtain support for this patient but have met many barriers.  We are attempting now to see if we can get financial discount for this patient despite his insurance status  Wt Readings from Last 3 Encounters: 03/08/19 : (!) 325 lb (147.4 kg) 12/31/18 : (!) 316 lb 9.6 oz (143.6 kg) 11/25/18 : (!) 319 lb 12.8 oz (145.1 kg)  10/05/2019 Last saw seen in November.  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  He also has a chronic umbilical hernia that is easily reducible.   This hernia is been present for some time.  He was hoping to achieve a combined surgical approach to remove the lipoma and repair the hernia which would require mesh placement.  The mass on the head is postauricular and getting slightly larger in size.  The patient's not able to afford his co-pay but he does have [employer].  Patient maintains aggressive glucose control using Trulicity weekly, Farxiga, Levemir insulin.  Note on arrival hemoglobin A1c is 6.1 blood glucose is 89  Past Medical History:  Diagnosis Date  . Dermatitis   . Diabetes mellitus   . GERD (gastroesophageal reflux disease)   . Hyperlipidemia   . Hypertension   . Lipoma of scalp   . Obesity   . Toenail fungus 08/31/2018     Family History  Problem Relation Age of Onset  . Stroke Mother   . Hypertension Mother   . Diabetes Maternal Grandmother   . Prostate cancer Maternal Grandfather   . Diabetes Other      Social History   Socioeconomic History  . Marital status: Single    Spouse name: n/a  . Number of children: 0  . Years of education: 34  . Highest education level: Not on file  Occupational History  . Occupation: Software engineer: [employer]  Tobacco Use  . Smoking status: Former Smoker  Quit date: 09/05/1994    Years since quitting: 25.0  . Smokeless tobacco: Never Used  Vaping Use  . Vaping Use: Never used  Substance and Sexual Activity  . Alcohol use: No    Alcohol/week: 0.0 standard drinks  . Drug use: No  . Sexual activity: Never  Other Topics Concern  . Not on file  Social History Narrative   Master's Degree in Adult Education.  Lives alone.   No siblings.   Social Determinants of Health   Financial Resource Strain:   . Difficulty of Paying Living Expenses:   Food Insecurity:   . Worried About Charity fundraiser in the Last Year:   . Arboriculturist in the Last Year:   Transportation Needs:   . Film/video editor  (Medical):   Marland Kitchen Lack of Transportation (Non-Medical):   Physical Activity:   . Days of Exercise per Week:   . Minutes of Exercise per Session:   Stress:   . Feeling of Stress :   Social Connections:   . Frequency of Communication with Friends and Family:   . Frequency of Social Gatherings with Friends and Family:   . Attends Religious Services:   . Active Member of Clubs or Organizations:   . Attends Archivist Meetings:   Marland Kitchen Marital Status:   Intimate Partner Violence:   . Fear of Current or Ex-Partner:   . Emotionally Abused:   Marland Kitchen Physically Abused:   . Sexually Abused:      Allergies  Allergen Reactions  . Cozaar [Losartan Potassium]     Hives   . Losartan Potassium-Hctz Hives     Outpatient Medications Prior to Visit  Medication Sig Dispense Refill  . Blood Glucose Monitoring Suppl (ONETOUCH VERIO) w/Device KIT Use as instructed to check blood sugar 3 times daily. E11.319 Z79.4 1 kit 0  . clobetasol ointment (TEMOVATE) 0.25 % Apply 1 application topically 3 (three) times daily as needed (rash.). 60 g 1  . glucose blood (ONETOUCH VERIO) test strip Use as instructed to check blood sugar 3 times daily. E11.319 Z79.4 100 each 11  . insulin detemir (LEVEMIR) 100 UNIT/ML injection INJECT 40 UNITS SUBCUTANEOUSLY 2 TIMES DAILY 20 mL 2  . Insulin Pen Needle 30G X 5 MM MISC 1 Stick by Does not apply route daily. 100 each 11  . Insulin Syringe-Needle U-100 (TRUEPLUS INSULIN SYRINGE) 31G X 5/16" 1 ML MISC Use to inject insulin twice daily as directed. 100 each 11  . metFORMIN (GLUCOPHAGE) 1000 MG tablet TAKE 1 TABLET BY MOUTH 2 TIMES DAILY WITH MEALS 60 tablet 2  . OneTouch Delica Lancets 42H MISC Use as instructed to check blood sugar 3 times daily. E11.319 Z79.4 100 each 11  . STIMULANT LAXATIVE 8.6-50 MG tablet TAKE 1 TABLET BY MOUTH DAILY 30 tablet 1  . amLODipine (NORVASC) 10 MG tablet Take 1 tablet (10 mg total) by mouth daily. 30 tablet 2  . atenolol (TENORMIN) 50 MG  tablet TAKE 1 TABLET BY MOUTH EVERY DAY -must have office visit FOR refills 30 tablet 0  . atorvastatin (LIPITOR) 40 MG tablet Take 1 tablet (40 mg total) by mouth daily. 30 tablet 3  . chlorthalidone (HYGROTON) 25 MG tablet Take 1 tablet (25 mg total) by mouth daily. 30 tablet 2  . clotrimazole-betamethasone (LOTRISONE) cream Apply 1 application topically 2 (two) times daily. 30 g 3  . colchicine 0.6 MG tablet Take 1 tablet (0.6 mg total) by mouth 2 (two) times  daily as needed. 20 tablet 1  . dapagliflozin propanediol (FARXIGA) 5 MG TABS tablet Take 5 mg by mouth daily. 30 tablet 2  . Dulaglutide (TRULICITY) 0.09 FG/1.8EX SOPN Inject 0.5 mLs (0.75 mg total) into the skin once a week. 2 mL 0  . hydrALAZINE (APRESOLINE) 25 MG tablet TAKE 1 TABLET BY MOUTH 3 TIMES DAILY 90 tablet 2  . ALPHAGAN P 0.1 % SOLN Place 1 drop into both eyes 2 (two) times daily. 15 mL 1  . naproxen sodium (ALEVE) 220 MG tablet Take 2 tablets (440 mg total) by mouth 2 (two) times daily as needed (pain). (Patient not taking: Reported on 03/08/2019) 180 tablet 0  . omeprazole (PRILOSEC) 20 MG capsule TAKE 1 CAPSULE BY MOUTH EVERY DAY (Patient not taking: Reported on 10/05/2019) 30 capsule 2   No facility-administered medications prior to visit.   Review of Systems  Constitutional: Negative.  Negative for fever.  HENT: Negative.   Eyes: Negative for visual disturbance.  Respiratory: Negative for cough, choking, chest tightness, shortness of breath and wheezing.   Cardiovascular: Negative for chest pain, palpitations and leg swelling.       Varicose veins  Gastrointestinal: Negative for abdominal distention, abdominal pain, anal bleeding, blood in stool, constipation, diarrhea, nausea, rectal pain and vomiting.  Endocrine: Negative.   Genitourinary: Negative.   Skin: Negative for rash.  Neurological: Negative.   Hematological: Negative.   Psychiatric/Behavioral: Negative.        Objective:   Physical Exam Vitals:    10/05/19 1354  BP: (!) 181/82  Pulse: 67  Resp: 16  Temp: 98 F (36.7 C)  SpO2: 100%  Weight: (!) 334 lb (151.5 kg)  Height: 6' 1"  (1.854 m)   Wt Readings from Last 3 Encounters:  10/05/19 (!) 334 lb (151.5 kg)  03/08/19 (!) 325 lb (147.4 kg)  12/31/18 (!) 316 lb 9.6 oz (143.6 kg)   Gen: Pleasant, obese, in no distress,  normal affect  ENT: No lesions,  mouth clear,  oropharynx clear, no postnasal drip, lipomatous lesion R posterior skull  Neck: No JVD, no TMG, no carotid bruits  Lungs: No use of accessory muscles, no dullness to percussion, clear without rales or rhonchi  Cardiovascular: RRR, heart sounds normal, no murmur or gallops, no peripheral edema, severe varicose veins  Abdomen: soft and NT, no HSM,  BS normal, umbilical hernia with part of bowel in hernia, this is reduceable  Musculoskeletal: No deformities, no cyanosis or clubbing   Neuro: alert, non focal  Skin: Warm, no lesions or rashes  BMP Latest Ref Rng & Units 03/08/2019 10/07/2018 07/12/2017  Glucose 65 - 99 mg/dL 126(H) 159(H) 220(H)  BUN 6 - 24 mg/dL 18 12 13   Creatinine 0.76 - 1.27 mg/dL 1.14 1.08 0.98  BUN/Creat Ratio 9 - 20 16 11 13   Sodium 134 - 144 mmol/L 140 144 138  Potassium 3.5 - 5.2 mmol/L 4.1 3.4(L) 3.9  Chloride 96 - 106 mmol/L 101 101 100  CO2 20 - 29 mmol/L 21 21 24   Calcium 8.7 - 10.2 mg/dL 9.5 9.6 8.9   Hepatic Function Panel     Component Value Date/Time   PROT 7.2 03/08/2019 1024   ALBUMIN 4.1 03/08/2019 1024   AST 17 03/08/2019 1024   ALT 12 03/08/2019 1024   ALKPHOS 101 03/08/2019 1024   BILITOT 1.1 03/08/2019 1024   CBC Latest Ref Rng & Units 07/12/2017 02/11/2017 08/16/2016  WBC 3.4 - 10.8 x10E3/uL 5.1 5.9 5.7  Hemoglobin 13.0 -  17.7 g/dL 15.4 16.2 15.2  Hematocrit 37.5 - 51.0 % 45.9 47.5 45.4  Platelets 150 - 379 x10E3/uL 179 198 184   A1C 6.1  CBG 89    Assessment & Plan:  I personally reviewed all images and lab data in the [facility] system as well as any outside  material available during this office visit and agree with the  radiology impressions.   BMI 45.0-49.9, adult (HCC) Severe obesity contributing to the umbilical hernia BMI is still at 44.07  Nutritional dietary measures were recommended to this patient  Hypertension Hypertension under poor control will need to increase hydralazine to 50 mg 3 times daily will continue atenolol 50 mg daily and amlodipine 10 mg daily along with chlorthalidone 25 mg daily  Hyperlipidemia associated with type 2 diabetes mellitus ([place]) Hyperlipidemia with LDL goal less than 70 now on atorvastatin 40 mg daily and will monitor  Lipoma Lipoma on head needs resection will partner with general surgery to see if we can determine the best course of action to keep his co-pays at a minimum  Umbilical hernia without obstruction and without gangrene The hernia has been present for a considerable period of time and is not currently symptomatic for this patient  I am wondering if we can proceed with a lipoma removal and hold off on the hernia repair at a later date to keep his cost down   Diagnoses and all orders for this visit:  Type 2 diabetes mellitus with retinopathy, with long-term current use of insulin, macular edema presence unspecified, unspecified laterality, unspecified retinopathy severity ([place])  Diabetes mellitus type 2 in obese ([place]) -     POCT glucose (manual entry) -     HgB A1c -     dapagliflozin propanediol (FARXIGA) 5 MG TABS tablet; Take 1 tablet (5 mg total) by mouth daily. -     Dulaglutide (TRULICITY) 2.13 YQ/6.5HQ SOPN; Inject 0.5 mLs (0.75 mg total) into the skin once a week.  Essential hypertension -     amLODipine (NORVASC) 10 MG tablet; Take 1 tablet (10 mg total) by mouth daily. -     atenolol (TENORMIN) 50 MG tablet; TAKE 1 TABLET BY MOUTH EVERY DAY -     chlorthalidone (HYGROTON) 25 MG tablet; Take 1 tablet (25 mg total) by mouth daily. -     hydrALAZINE (APRESOLINE) 50 MG tablet;  Take 1 tablet (50 mg total) by mouth 3 (three) times daily.  Hyperlipidemia, unspecified hyperlipidemia type -     atorvastatin (LIPITOR) 40 MG tablet; Take 1 tablet (40 mg total) by mouth daily.  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  Need for hepatitis C screening test -     Hepatitis c antibody (reflex)  Umbilical hernia without obstruction and without gangrene  Mass of head, right postauricular SQ 7x4cm  Lipoma of other specified sites  Dermatitis -     clotrimazole-betamethasone (LOTRISONE) cream; Apply 1 application topically 2 (two) times daily.  BMI 45.0-49.9, adult ([place])  Hyperlipidemia associated with type 2 diabetes mellitus ([place])  Other orders -     ALPHAGAN P 0.1 % SOLN; Place 1 drop into both eyes 2 (two) times daily.

## 2019-10-05 NOTE — Progress Notes (Signed)
Here for DM check up

## 2019-10-05 NOTE — Patient Instructions (Signed)
Increase hydralazine to 50mg  three times a day All other medications the same Stop by lab today for a Hepatitis C assay blood draw  Return Thursday this week to see Dr Judeth Horn of surgery to evaluate your options on lowering your cost of care and improving your outcomes for a faster recovery  See Dr Joya Gaskins one month

## 2019-10-05 NOTE — Assessment & Plan Note (Signed)
Hyperlipidemia with LDL goal less than 70 now on atorvastatin 40 mg daily and will monitor

## 2019-10-05 NOTE — Assessment & Plan Note (Signed)
Lipoma on head needs resection will partner with general surgery to see if we can determine the best course of action to keep his co-pays at a minimum

## 2019-10-05 NOTE — Assessment & Plan Note (Addendum)
Hypertension under poor control will need to increase hydralazine to 50 mg 3 times daily will continue atenolol 50 mg daily and amlodipine 10 mg daily along with chlorthalidone 25 mg daily

## 2019-10-06 LAB — HEPATITIS C ANTIBODY (REFLEX): HCV Ab: <0.1 s/co ratio (ref 0.0–0.9)

## 2019-10-06 LAB — HCV COMMENT:

## 2019-10-07 ENCOUNTER — Encounter: Payer: Self-pay | Admitting: General Surgery

## 2019-10-07 ENCOUNTER — Ambulatory Visit: Payer: BC Managed Care – PPO | Attending: General Surgery | Admitting: General Surgery

## 2019-10-07 ENCOUNTER — Other Ambulatory Visit: Payer: Self-pay

## 2019-10-07 VITALS — BP 158/88 | HR 62 | Temp 98.3°F | Resp 16

## 2019-10-07 DIAGNOSIS — D170 Benign lipomatous neoplasm of skin and subcutaneous tissue of head, face and neck: Secondary | ICD-10-CM

## 2019-10-07 DIAGNOSIS — K420 Umbilical hernia with obstruction, without gangrene: Secondary | ICD-10-CM

## 2019-10-07 NOTE — Patient Instructions (Signed)
Referral to Plastic surgery.  I have apoken wih Dr. Marla Roe and she will consult on him as an oupaien

## 2019-10-07 NOTE — Progress Notes (Signed)
Here referred by PCP for surgical consult

## 2019-10-07 NOTE — Progress Notes (Signed)
Established Patient Office Visit  Subjective:  Patient ID: Justin Houston, male    DOB: Sep 19, 1965  Age: 54 y.o. MRN: [id number]  CC: No chief complaint on file. Large right scalp periauricular lipoma and large chronically incarcerated umbilical hernia  HPI Justin Houston presents for right periauricular/temporal lipoma or large cyst and large chronically incarcerated umbilical hernia  Past Medical History:  Diagnosis Date  . Dermatitis   . Diabetes mellitus   . GERD (gastroesophageal reflux disease)   . Hyperlipidemia   . Hypertension   . Lipoma of scalp   . Obesity   . Toenail fungus 08/31/2018    Past Surgical History:  Procedure Laterality Date  . COLONOSCOPY WITH PROPOFOL N/A 12/01/2014   Procedure: COLONOSCOPY WITH PROPOFOL;  Surgeon: [name], MD;  Location: [facility];  Service: Endoscopy;  Laterality: N/A;  . right arm ORIF      Family History  Problem Relation Age of Onset  . Stroke Mother   . Hypertension Mother   . Diabetes Maternal Grandmother   . Prostate cancer Maternal Grandfather   . Diabetes Other     Social History   Socioeconomic History  . Marital status: Single    Spouse name: n/a  . Number of children: 0  . Years of education: 82  . Highest education level: Not on file  Occupational History  . Occupation: Software engineer: [employer]  Tobacco Use  . Smoking status: Former Smoker    Quit date: 09/05/1994    Years since quitting: 25.1  . Smokeless tobacco: Never Used  Vaping Use  . Vaping Use: Never used  Substance and Sexual Activity  . Alcohol use: No    Alcohol/week: 0.0 standard drinks  . Drug use: No  . Sexual activity: Never  Other Topics Concern  . Not on file  Social History Narrative   Master's Degree in Adult Education.  Lives alone.   No siblings.   Social Determinants of Health   Financial Resource Strain:   . Difficulty of Paying Living Expenses:   Food Insecurity:   .  Worried About Charity fundraiser in the Last Year:   . Arboriculturist in the Last Year:   Transportation Needs:   . Film/video editor (Medical):   Marland Kitchen Lack of Transportation (Non-Medical):   Physical Activity:   . Days of Exercise per Week:   . Minutes of Exercise per Session:   Stress:   . Feeling of Stress :   Social Connections:   . Frequency of Communication with Friends and Family:   . Frequency of Social Gatherings with Friends and Family:   . Attends Religious Services:   . Active Member of Clubs or Organizations:   . Attends Archivist Meetings:   Marland Kitchen Marital Status:   Intimate Partner Violence:   . Fear of Current or Ex-Partner:   . Emotionally Abused:   Marland Kitchen Physically Abused:   . Sexually Abused:     Outpatient Medications Prior to Visit  Medication Sig Dispense Refill  . ALPHAGAN P 0.1 % SOLN Place 1 drop into both eyes 2 (two) times daily. 15 mL 1  . amLODipine (NORVASC) 10 MG tablet Take 1 tablet (10 mg total) by mouth daily. 30 tablet 2  . atenolol (TENORMIN) 50 MG tablet TAKE 1 TABLET BY MOUTH EVERY DAY 30 tablet 3  . atorvastatin (LIPITOR) 40 MG tablet Take 1 tablet (40 mg total)  by mouth daily. 30 tablet 3  . Blood Glucose Monitoring Suppl (ONETOUCH VERIO) w/Device KIT Use as instructed to check blood sugar 3 times daily. E11.319 Z79.4 1 kit 0  . chlorthalidone (HYGROTON) 25 MG tablet Take 1 tablet (25 mg total) by mouth daily. 30 tablet 2  . clobetasol ointment (TEMOVATE) 0.10 % Apply 1 application topically 3 (three) times daily as needed (rash.). 60 g 1  . clotrimazole-betamethasone (LOTRISONE) cream Apply 1 application topically 2 (two) times daily. 30 g 3  . colchicine 0.6 MG tablet Take 1 tablet (0.6 mg total) by mouth 2 (two) times daily as needed. 20 tablet 1  . dapagliflozin propanediol (FARXIGA) 5 MG TABS tablet Take 1 tablet (5 mg total) by mouth daily. 30 tablet 2  . Dulaglutide (TRULICITY) 2.72 ZD/6.6YQ SOPN Inject 0.5 mLs (0.75 mg total)  into the skin once a week. 2 mL 1  . glucose blood (ONETOUCH VERIO) test strip Use as instructed to check blood sugar 3 times daily. E11.319 Z79.4 100 each 11  . hydrALAZINE (APRESOLINE) 50 MG tablet Take 1 tablet (50 mg total) by mouth 3 (three) times daily. 90 tablet 3  . insulin detemir (LEVEMIR) 100 UNIT/ML injection INJECT 40 UNITS SUBCUTANEOUSLY 2 TIMES DAILY 20 mL 2  . Insulin Pen Needle 30G X 5 MM MISC 1 Stick by Does not apply route daily. 100 each 11  . Insulin Syringe-Needle U-100 (TRUEPLUS INSULIN SYRINGE) 31G X 5/16" 1 ML MISC Use to inject insulin twice daily as directed. 100 each 11  . metFORMIN (GLUCOPHAGE) 1000 MG tablet TAKE 1 TABLET BY MOUTH 2 TIMES DAILY WITH MEALS 60 tablet 2  . OneTouch Delica Lancets 03K MISC Use as instructed to check blood sugar 3 times daily. E11.319 Z79.4 100 each 11  . STIMULANT LAXATIVE 8.6-50 MG tablet TAKE 1 TABLET BY MOUTH DAILY 30 tablet 1   No facility-administered medications prior to visit.    Allergies  Allergen Reactions  . Cozaar [Losartan Potassium]     Hives   . Losartan Potassium-Hctz Hives    ROS Review of Systems  All other systems reviewed and are negative.     Objective:    Physical Exam  BP (!) 158/88 (BP Location: Left Arm)   Pulse 62   Temp 98.3 F (36.8 C)   Resp 16   SpO2 98%  Wt Readings from Last 3 Encounters:  10/05/19 (!) 334 lb (151.5 kg)  03/08/19 (!) 325 lb (147.4 kg)  12/31/18 (!) 316 lb 9.6 oz (143.6 kg)     Right scalp lipoma    Umbilical hernia  Umbilical hernia is chrnically incarcerated, but I was able to gett it partially reduced in the standing position wihout discomfort.  Scalp mass could be cystic, but felt lipomatous.  Measured about 8-10 cm by 5-6 cm.  Not tender.  Mobile, but could not tell is cystic.  There are no preventive care reminders to display for this patient.  There are no preventive care reminders to display for this patient.  Lab Results  Component Value Date    TSH 3.450 07/12/2017   Lab Results  Component Value Date   WBC 5.1 07/12/2017   HGB 15.4 07/12/2017   HCT 45.9 07/12/2017   MCV 91 07/12/2017   PLT 179 07/12/2017   Lab Results  Component Value Date   NA 140 03/08/2019   K 4.1 03/08/2019   CO2 21 03/08/2019   GLUCOSE 126 (H) 03/08/2019   BUN 18 03/08/2019  CREATININE 1.14 03/08/2019   BILITOT 1.1 03/08/2019   ALKPHOS 101 03/08/2019   AST 17 03/08/2019   ALT 12 03/08/2019   PROT 7.2 03/08/2019   ALBUMIN 4.1 03/08/2019   CALCIUM 9.5 03/08/2019   Lab Results  Component Value Date   CHOL 125 10/07/2018   Lab Results  Component Value Date   HDL 32 (L) 10/07/2018   Lab Results  Component Value Date   LDLCALC 71 10/07/2018   Lab Results  Component Value Date   TRIG 109 10/07/2018   Lab Results  Component Value Date   CHOLHDL 3.9 10/07/2018   Lab Results  Component Value Date   HGBA1C 6.1 (A) 10/05/2019      Assessment & Plan:   Problem List Items Addressed This Visit    None      No orders of the defined types were placed in this encounter.   Follow-up: No follow-ups on file.    Judeth Horn, MD

## 2019-10-13 ENCOUNTER — Other Ambulatory Visit: Payer: Self-pay | Admitting: Critical Care Medicine

## 2019-10-17 NOTE — Progress Notes (Signed)
Subjective:  Patient ID: Justin Houston, male    DOB: 1965-09-09,  MRN: [id number]  Chief Complaint  Patient presents with  . Callouses    Left lateral foot callous trim  . Nail Problem    Nail trim 1-5 bilateral  . Diabetes Mellitus    Diabetic foot exam    54 y.o. male presents  for diabetic foot care. Last AMBS was unknown. Denies numbness and tingling in their feet. Denies cramping in legs and thighs.  Review of Systems: Negative except as noted in the HPI. Denies N/V/F/Ch.  Past Medical History:  Diagnosis Date  . Dermatitis   . Diabetes mellitus   . GERD (gastroesophageal reflux disease)   . Hyperlipidemia   . Hypertension   . Lipoma of scalp   . Obesity   . Toenail fungus 08/31/2018    Current Outpatient Medications:  .  Blood Glucose Monitoring Suppl (ONETOUCH VERIO) w/Device KIT, Use as instructed to check blood sugar 3 times daily. E11.319 Z79.4, Disp: 1 kit, Rfl: 0 .  clobetasol ointment (TEMOVATE) 7.89 %, Apply 1 application topically 3 (three) times daily as needed (rash.)., Disp: 60 g, Rfl: 1 .  glucose blood (ONETOUCH VERIO) test strip, Use as instructed to check blood sugar 3 times daily. E11.319 Z79.4, Disp: 100 each, Rfl: 11 .  OneTouch Delica Lancets 38B MISC, Use as instructed to check blood sugar 3 times daily. E11.319 Z79.4, Disp: 100 each, Rfl: 11 .  ALPHAGAN P 0.1 % SOLN, Place 1 drop into both eyes 2 (two) times daily., Disp: 15 mL, Rfl: 1 .  amLODipine (NORVASC) 10 MG tablet, Take 1 tablet (10 mg total) by mouth daily., Disp: 30 tablet, Rfl: 2 .  atenolol (TENORMIN) 50 MG tablet, TAKE 1 TABLET BY MOUTH EVERY DAY, Disp: 30 tablet, Rfl: 3 .  atorvastatin (LIPITOR) 40 MG tablet, Take 1 tablet (40 mg total) by mouth daily., Disp: 30 tablet, Rfl: 3 .  chlorthalidone (HYGROTON) 25 MG tablet, Take 1 tablet (25 mg total) by mouth daily., Disp: 30 tablet, Rfl: 2 .  clotrimazole-betamethasone (LOTRISONE)  cream, Apply 1 application topically 2 (two) times daily., Disp: 30 g, Rfl: 3 .  colchicine 0.6 MG tablet, Take 1 tablet (0.6 mg total) by mouth 2 (two) times daily as needed., Disp: 20 tablet, Rfl: 1 .  dapagliflozin propanediol (FARXIGA) 5 MG TABS tablet, Take 1 tablet (5 mg total) by mouth daily., Disp: 30 tablet, Rfl: 2 .  Dulaglutide (TRULICITY) 0.17 PZ/0.2HE SOPN, Inject 0.5 mLs (0.75 mg total) into the skin once a week., Disp: 2 mL, Rfl: 1 .  hydrALAZINE (APRESOLINE) 50 MG tablet, Take 1 tablet (50 mg total) by mouth 3 (three) times daily., Disp: 90 tablet, Rfl: 3 .  insulin detemir (LEVEMIR) 100 UNIT/ML injection, INJECT 40 UNITS SUBCUTANEOUSLY 2 TIMES DAILY, Disp: 20 mL, Rfl: 2 .  Insulin Pen Needle 30G X 5 MM MISC, 1 Stick by Does not apply route daily., Disp: 100 each, Rfl: 11 .  Insulin Syringe-Needle U-100 (TRUEPLUS INSULIN SYRINGE) 31G X 5/16" 1 ML MISC, Use to inject insulin twice daily as directed., Disp: 100 each, Rfl: 11 .  metFORMIN (GLUCOPHAGE) 1000 MG tablet, TAKE 1 TABLET BY MOUTH 2 TIMES DAILY WITH MEALS, Disp: 60 tablet, Rfl: 2 .  STIMULANT LAXATIVE 8.6-50 MG tablet, TAKE 1 TABLET BY MOUTH DAILY, Disp: 30 tablet, Rfl: 1  Social History   Tobacco Use  Smoking Status Former Smoker  . Quit date: 09/05/1994  . Years  since quitting: 25.1  Smokeless Tobacco Never Used    Allergies  Allergen Reactions  . Cozaar [Losartan Potassium]     Hives   . Losartan Potassium-Hctz Hives   Objective:   There were no vitals filed for this visit. There is no height or weight on file to calculate BMI. Constitutional Well developed. Well nourished.  Vascular Dorsalis pedis pulses present 1+ bilaterally  Posterior tibial pulses absent bilaterally  Pedal hair growth diminished. Capillary refill normal to all digits.  No cyanosis or clubbing noted.  Neurologic Normal speech. Oriented to person, place, and time. Epicritic sensation to light touch grossly present  bilaterally. Protective sensation with 5.07 monofilament  present bilaterally. Vibratory sensation present bilaterally.  Dermatologic Nails elongated, thickened, dystrophic. No open wounds. HPK lateral 5th Met base  Orthopedic: Normal joint ROM without pain or crepitus bilaterally. No visible deformities. No bony tenderness.   Assessment:   1. Onychomycosis of multiple toenails with type 2 diabetes mellitus and peripheral angiopathy (HCC)   2. Callus of foot    Plan:  Patient was evaluated and treated and all questions answered.  Diabetes with PAD, Onychomycosis -Nails debrided x10 -Callus debrided x1  Procedure: Nail Debridement Rationale: Patient meets criteria for routine foot care due to PAD Type of Debridement: manual, sharp debridement. Instrumentation: Nail nipper, rotary burr. Number of Nails: 10  Procedure: Callus Debridement Rationale: Patient meets criteria for routine foot care due to PAD. Type of Debridement: sharp Instrumentation: 312 blade Number of Calluses: 1   No follow-ups on file.

## 2019-10-19 ENCOUNTER — Encounter: Payer: Self-pay | Admitting: *Deleted

## 2019-10-19 NOTE — [facility] Nurse Program (Signed)
  Dept: [facility] Nurse Program Note  Date of Encounter: 10/19/2019  Past Medical History: Past Medical History:  Diagnosis Date  . Dermatitis   . Diabetes mellitus   . GERD (gastroesophageal reflux disease)   . Hyperlipidemia   . Hypertension   . Lipoma of scalp   . Obesity   . Toenail fungus 08/31/2018    Encounter Details:  CNP Questionnaire - 10/19/19 1432      Questionnaire   Patient Status Not Applicable    Race Black or African American    Location Patient Served At [facility] [employer] [facility] [employer]    Uninsured Not Applicable    Food Within past 12 months, worried food would run out with no money to buy more    Housing/Utilities Yes, have permanent housing    Transportation No transportation needs    Interpersonal Safety Yes, feel physically and emotionally safe where you currently live    Medication No medication insecurities    Medical Provider Yes    Referrals Not Applicable    ED Visit Averted Not Applicable    Life-Saving Intervention Made Not Applicable          Client came into [facility] today.  I met client for the first time today.  BP 152/82 P64.  He tells me that he had an appointment with Dr. Joya Gaskins recently.  Client says he needs to follow up now with plastic surgeon for appointment.  He has a large lipoma vs cyst on his right scalp area of his head.  He discussed his changes in BP medication and we discussed his diabetes.  I gave him some flyers and diabetic information to take with him.  He will keep me informed of the pending surgeries once they are scheduled.  I encouraged him to call me if I can assist him in any way.  I gave him my business card and phone number.  Karene Fry, RN, MSN, [place] Office [phone] Cell

## 2019-11-04 ENCOUNTER — Other Ambulatory Visit: Payer: Self-pay

## 2019-11-04 ENCOUNTER — Ambulatory Visit (INDEPENDENT_AMBULATORY_CARE_PROVIDER_SITE_OTHER): Payer: BC Managed Care – PPO | Admitting: Podiatry

## 2019-11-04 DIAGNOSIS — B351 Tinea unguium: Secondary | ICD-10-CM | POA: Diagnosis not present

## 2019-11-04 DIAGNOSIS — E1151 Type 2 diabetes mellitus with diabetic peripheral angiopathy without gangrene: Secondary | ICD-10-CM

## 2019-11-04 DIAGNOSIS — E1169 Type 2 diabetes mellitus with other specified complication: Secondary | ICD-10-CM | POA: Diagnosis not present

## 2019-11-04 DIAGNOSIS — L84 Corns and callosities: Secondary | ICD-10-CM | POA: Diagnosis not present

## 2019-11-04 NOTE — Progress Notes (Signed)
  Subjective:  Patient ID: Justin Houston, male    DOB: [birth year],  MRN: [id number]  Chief Complaint  Patient presents with  . Diabetes    pt is here for diabetic foot care, pt also states that he is also here for a nail trim    54 y.o. male presents with the above complaint. History confirmed with patient.   Objective:  Physical Exam: warm, good capillary refill, nail exam onychomycosis of the toenails, no trophic changes or ulcerative lesions. DP pulses palpable and protective sensation intact PT pulse non-palp Left Foot: normal exam, no swelling, tenderness, instability; ligaments intact, full range of motion of all ankle/foot joints. HPK lateral 5th met base Right Foot: normal exam, no swelling, tenderness, instability; ligaments intact, full range of motion of all ankle/foot joints   No images are attached to the encounter.  Assessment:   1. Onychomycosis of multiple toenails with type 2 diabetes mellitus and peripheral angiopathy (HCC)   2. Callus    Plan:  Patient was evaluated and treated and all questions answered.  Onychomycosis, Diabetes and PAD -Patient is diabetic with a qualifying condition for at risk foot care.  Procedure: Nail Debridement Rationale: Patient meets criteria for routine foot care due to PAD Type of Debridement: manual, sharp debridement. Instrumentation: Nail nipper, rotary burr. Number of Nails: 10  Procedure: Paring of Lesion Rationale: painful hyperkeratotic lesion Type of Debridement: manual, sharp debridement. Instrumentation: 312 blade Number of Lesions: 1  No follow-ups on file.

## 2019-11-05 ENCOUNTER — Ambulatory Visit: Payer: BC Managed Care – PPO | Admitting: Podiatry

## 2019-11-09 ENCOUNTER — Ambulatory Visit: Payer: BC Managed Care – PPO | Attending: Critical Care Medicine | Admitting: Critical Care Medicine

## 2019-11-09 ENCOUNTER — Encounter: Payer: Self-pay | Admitting: Critical Care Medicine

## 2019-11-09 ENCOUNTER — Other Ambulatory Visit: Payer: Self-pay

## 2019-11-09 VITALS — BP 149/81 | HR 71 | Temp 98.2°F | Resp 18 | Ht 71.0 in | Wt 327.0 lb

## 2019-11-09 DIAGNOSIS — E669 Obesity, unspecified: Secondary | ICD-10-CM

## 2019-11-09 DIAGNOSIS — I10 Essential (primary) hypertension: Secondary | ICD-10-CM | POA: Diagnosis not present

## 2019-11-09 DIAGNOSIS — E785 Hyperlipidemia, unspecified: Secondary | ICD-10-CM | POA: Diagnosis not present

## 2019-11-09 DIAGNOSIS — K429 Umbilical hernia without obstruction or gangrene: Secondary | ICD-10-CM

## 2019-11-09 DIAGNOSIS — R220 Localized swelling, mass and lump, head: Secondary | ICD-10-CM

## 2019-11-09 DIAGNOSIS — E11319 Type 2 diabetes mellitus with unspecified diabetic retinopathy without macular edema: Secondary | ICD-10-CM

## 2019-11-09 DIAGNOSIS — Z794 Long term (current) use of insulin: Secondary | ICD-10-CM

## 2019-11-09 DIAGNOSIS — K219 Gastro-esophageal reflux disease without esophagitis: Secondary | ICD-10-CM

## 2019-11-09 DIAGNOSIS — E1169 Type 2 diabetes mellitus with other specified complication: Secondary | ICD-10-CM

## 2019-11-09 LAB — GLUCOSE, POCT (MANUAL RESULT ENTRY): POC Glucose: 180 mg/dl — AB (ref 70–99)

## 2019-11-09 MED ORDER — OMEPRAZOLE 20 MG PO CPDR: 20.0000 mg | DELAYED_RELEASE_CAPSULE | Freq: Every day | ORAL | 4 refills | Status: DC

## 2019-11-09 NOTE — Assessment & Plan Note (Signed)
Mass on the head which represents a lipoma  Patient to follow-up with plastic surgery to look into removing this

## 2019-11-09 NOTE — Assessment & Plan Note (Signed)
Hypertension currently at desired goal we will continue current program and refill medications which includes the amlodipine 10 mg daily, chlorthalidone 25 mg daily, Hydralazine 50 mg 3 times a day, and Tenormin 50 mg daily  We will follow-up basic metabolic profile

## 2019-11-09 NOTE — Progress Notes (Signed)
Subjective:    Patient ID: Justin Houston, male    DOB: 08/30/1965, 54 y.o.   MRN: [id number]  This is a 54 year old male seen in f/u for T2DM, back pain, GERD, varicose veins.   The patient has been seen by clinical pharmacist with medication adjustments.  The pt was referred to podiatry and was seen.   Gen Surgery made but not yet occurred for umbilical hernia.  No dyspnea, pt is moving more.  CBGs much improved 90-100 fasting up to 120   Wt Readings from Last 3 Encounters: 11/02/18 : (!) 318 lb (144.2 kg) 08/31/18 : (!) 316 lb (143.3 kg) 11/28/17 : (!) 326 lb (147.9 kg)   11/23  This patient was last seen in July and at that visit we continued amlodipine 10 mg daily atenolol 50 mg daily chlorthalidone daily and hydralazine 3 times daily the patient's blood pressure remains quite labile since that time.  Also the patient has been seen by vascular surgery for varicose veins and they have prescribed thigh-high support hose.  The patient's been compliant with his diabetic program to include the Levemir Trulicity and Metformin along with [place].  His hemoglobin A1c today was 5.6 which is a significant reduction  We did refer the patient to general surgery to treat his umbilical hernia and lipoma on the head unfortunately he cannot afford the co-pay for the surgical procedure despite his insurance coverage.  We tried to find various methods to obtain support for this patient but have met many barriers.  We are attempting now to see if we can get financial discount for this patient despite his insurance status  Wt Readings from Last 3 Encounters: 03/08/19 : (!) 325 lb (147.4 kg) 12/31/18 : (!) 316 lb 9.6 oz (143.6 kg) 11/25/18 : (!) 319 lb 12.8 oz (145.1 kg)  10/05/2019 Last saw seen in November.  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  He also has a chronic umbilical hernia that is easily reducible.   This hernia is been present for some time.  He was hoping to achieve a combined surgical approach to remove the lipoma and repair the hernia which would require mesh placement.  The mass on the head is postauricular and getting slightly larger in size.  The patient's not able to afford his co-pay but he does have [employer].  Patient maintains aggressive glucose control using Trulicity weekly, Farxiga, Levemir insulin.  Note on arrival hemoglobin A1c is 6.1 blood glucose is 89  11/09/2019 This patient is seen in return follow-up for a lipoma of the skull and hypertension diabetes  Patient's glycemic control has been reasonable at home is 100 in the morning 180 postprandial  The patient has an appointment with plastic surgery to evaluate the removal of the lipoma on the head and he is in a take this on first and hold off on the umbilical hernia for now Patient has no new complaints he did receive his Covid vaccine there are no primary care gaps currently seen other than the fact he needs a repeat follow-up blood draw for his renal function   Past Medical History:  Diagnosis Date  . Dermatitis   . Diabetes mellitus   . GERD (gastroesophageal reflux disease)   . Hyperlipidemia   . Hypertension   . Lipoma of scalp   . Obesity   . Toenail fungus 08/31/2018  Family History  Problem Relation Age of Onset  . Stroke Mother   . Hypertension Mother   . Diabetes Maternal Grandmother   . Prostate cancer Maternal Grandfather   . Diabetes Other      Social History   Socioeconomic History  . Marital status: Single    Spouse name: n/a  . Number of children: 0  . Years of education: 15  . Highest education level: Not on file  Occupational History  . Occupation: Software engineer: [employer]  Tobacco Use  . Smoking status: Former Smoker    Quit date: 09/05/1994    Years since quitting: 25.1  . Smokeless tobacco: Never Used   Vaping Use  . Vaping Use: Never used  Substance and Sexual Activity  . Alcohol use: No    Alcohol/week: 0.0 standard drinks  . Drug use: No  . Sexual activity: Never  Other Topics Concern  . Not on file  Social History Narrative   Master's Degree in Adult Education.  Lives alone.   No siblings.   Social Determinants of Health   Financial Resource Strain:   . Difficulty of Paying Living Expenses:   Food Insecurity:   . Worried About Charity fundraiser in the Last Year:   . Arboriculturist in the Last Year:   Transportation Needs:   . Film/video editor (Medical):   Marland Kitchen Lack of Transportation (Non-Medical):   Physical Activity:   . Days of Exercise per Week:   . Minutes of Exercise per Session:   Stress:   . Feeling of Stress :   Social Connections:   . Frequency of Communication with Friends and Family:   . Frequency of Social Gatherings with Friends and Family:   . Attends Religious Services:   . Active Member of Clubs or Organizations:   . Attends Archivist Meetings:   Marland Kitchen Marital Status:   Intimate Partner Violence:   . Fear of Current or Ex-Partner:   . Emotionally Abused:   Marland Kitchen Physically Abused:   . Sexually Abused:      Allergies  Allergen Reactions  . Cozaar [Losartan Potassium]     Hives   . Losartan Potassium-Hctz Hives     Outpatient Medications Prior to Visit  Medication Sig Dispense Refill  . ALPHAGAN P 0.1 % SOLN Place 1 drop into both eyes 2 (two) times daily. 15 mL 1  . amLODipine (NORVASC) 10 MG tablet Take 1 tablet (10 mg total) by mouth daily. 30 tablet 2  . atenolol (TENORMIN) 50 MG tablet TAKE 1 TABLET BY MOUTH EVERY DAY 30 tablet 3  . atorvastatin (LIPITOR) 40 MG tablet Take 1 tablet (40 mg total) by mouth daily. 30 tablet 3  . Blood Glucose Monitoring Suppl (ONETOUCH VERIO) w/Device KIT Use as instructed to check blood sugar 3 times daily. E11.319 Z79.4 1 kit 0  . chlorthalidone (HYGROTON) 25 MG tablet Take 1 tablet (25 mg  total) by mouth daily. 30 tablet 2  . clobetasol ointment (TEMOVATE) 2.95 % Apply 1 application topically 3 (three) times daily as needed (rash.). 60 g 1  . clotrimazole-betamethasone (LOTRISONE) cream Apply 1 application topically 2 (two) times daily. 30 g 3  . colchicine 0.6 MG tablet Take 1 tablet (0.6 mg total) by mouth 2 (two) times daily as needed. 20 tablet 1  . dapagliflozin propanediol (FARXIGA) 5 MG TABS tablet Take 1 tablet (5 mg total) by mouth daily. 30 tablet  2  . Dulaglutide (TRULICITY) 4.66 ZL/9.3TT SOPN Inject 0.5 mLs (0.75 mg total) into the skin once a week. 2 mL 1  . glucose blood (ONETOUCH VERIO) test strip Use as instructed to check blood sugar 3 times daily. E11.319 Z79.4 100 each 11  . hydrALAZINE (APRESOLINE) 50 MG tablet Take 1 tablet (50 mg total) by mouth 3 (three) times daily. 90 tablet 3  . insulin detemir (LEVEMIR) 100 UNIT/ML injection INJECT 40 UNITS SUBCUTANEOUSLY 2 TIMES DAILY 20 mL 2  . Insulin Pen Needle 30G X 5 MM MISC 1 Stick by Does not apply route daily. 100 each 11  . Insulin Syringe-Needle U-100 (TRUEPLUS INSULIN SYRINGE) 31G X 5/16" 1 ML MISC Use to inject insulin twice daily as directed. 100 each 11  . metFORMIN (GLUCOPHAGE) 1000 MG tablet TAKE 1 TABLET BY MOUTH 2 TIMES DAILY WITH MEALS 60 tablet 2  . OneTouch Delica Lancets 01X MISC Use as instructed to check blood sugar 3 times daily. E11.319 Z79.4 100 each 11  . STIMULANT LAXATIVE 8.6-50 MG tablet TAKE 1 TABLET BY MOUTH DAILY 30 tablet 1  . omeprazole (PRILOSEC) 20 MG capsule Take 20 mg by mouth daily.     No facility-administered medications prior to visit.   Review of Systems  Constitutional: Negative.  Negative for fever.  HENT: Negative.   Eyes: Negative for visual disturbance.  Respiratory: Negative for cough, choking, chest tightness, shortness of breath and wheezing.   Cardiovascular: Negative for chest pain, palpitations and leg swelling.       Varicose veins  Gastrointestinal: Negative  for abdominal distention, abdominal pain, anal bleeding, blood in stool, constipation, diarrhea, nausea, rectal pain and vomiting.  Endocrine: Negative.   Genitourinary: Negative.   Skin: Negative for rash.  Neurological: Negative.   Hematological: Negative.   Psychiatric/Behavioral: Negative.        Objective:   Physical Exam Vitals:   11/09/19 1545  BP: (!) 149/81  Pulse: 71  Resp: 18  Temp: 98.2 F (36.8 C)  SpO2: 100%  Weight: (!) 327 lb (148.3 kg)  Height: _0  (1.803 m)   Wt Readings from Last 3 Encounters:  11/09/19 (!) 327 lb (148.3 kg)  10/05/19 (!) 334 lb (151.5 kg)  03/08/19 (!) 325 lb (147.4 kg)   Gen: Pleasant, obese, in no distress,  normal affect  ENT: No lesions,  mouth clear,  oropharynx clear, no postnasal drip, lipomatous lesion R posterior skull  Neck: No JVD, no TMG, no carotid bruits  Lungs: No use of accessory muscles, no dullness to percussion, clear without rales or rhonchi  Cardiovascular: RRR, heart sounds normal, no murmur or gallops, no peripheral edema, severe varicose veins  Abdomen: soft and NT, no HSM,  BS normal, umbilical hernia with part of bowel in hernia, this is reduceable  Musculoskeletal: No deformities, no cyanosis or clubbing   Neuro: alert, non focal  Skin: Warm, no lesions or rashes  BMP Latest Ref Rng & Units 03/08/2019 10/07/2018 07/12/2017  Glucose 65 - 99 mg/dL 126(H) 159(H) 220(H)  BUN 6 - 24 mg/dL _1 Creatinine 0.76 - 1.27 mg/dL 1.14 1.08 0.98  BUN/Creat Ratio 9 - _2 Sodium 134 - 144 mmol/L 140 144 138  Potassium 3.5 - 5.2 mmol/L 4.1 3.4(L) 3.9  Chloride 96 - 106 mmol/L 101 101 100  CO2 20 - 29 mmol/L _3 Calcium 8.7 - 10.2 mg/dL 9.5 9.6 8.9   Hepatic Function Panel  Component Value Date/Time   PROT 7.2 03/08/2019 1024   ALBUMIN 4.1 03/08/2019 1024   AST 17 03/08/2019 1024   ALT 12 03/08/2019 1024   ALKPHOS 101 03/08/2019 1024   BILITOT 1.1 03/08/2019 1024   CBC Latest Ref  Rng & Units 07/12/2017 02/11/2017 08/16/2016  WBC 3.4 - 10.8 x10E3/uL 5.1 5.9 5.7  Hemoglobin 13.0 - 17.7 g/dL 15.4 16.2 15.2  Hematocrit 37.5 - 51.0 % 45.9 47.5 45.4  Platelets 150 - 379 x10E3/uL 179 198 184   A1C 6.1  CBG 89    Assessment & Plan:  I personally reviewed all images and lab data in the [facility] system as well as any outside material available during this office visit and agree with the  radiology impressions.   Hypertension Hypertension currently at desired goal we will continue current program and refill medications which includes the amlodipine 10 mg daily, chlorthalidone 25 mg daily, Hydralazine 50 mg 3 times a day, and Tenormin 50 mg daily  We will follow-up basic metabolic profile  Diabetes mellitus type 2 with retinopathy (HCC) Type 2 diabetes with retinopathy recent hemoglobin A1c was improving we will continue current program of Levemir 40 units twice daily, Trulicity 75 mcg weekly, Metformin at 1000 mg twice daily, and Farxiga 5 mg daily  Hyperlipidemia associated with type 2 diabetes mellitus ([place]) Hyperlipidemia Patient is currently on atorvastatin 40 mg daily we will continue same  Mass of head, right postauricular SQ 7x4cm Mass on the head which represents a lipoma  Patient to follow-up with plastic surgery to look into removing this  Umbilical hernia without obstruction and without gangrene For now plan is to hold off on umbilical hernia repair until the lipoma is addressed   Diagnoses and all orders for this visit:  Diabetes mellitus type 2 in obese ([place]) -     POCT glucose (manual entry) -     Basic Metabolic Panel  Essential hypertension  Gastroesophageal reflux disease without esophagitis  Type 2 diabetes mellitus with retinopathy, with long-term current use of insulin, macular edema presence unspecified, unspecified laterality, unspecified retinopathy severity ([place])  Hyperlipidemia associated with type 2 diabetes mellitus (HCC)  Mass of head,  right postauricular SQ [id number]  Umbilical hernia without obstruction and without gangrene  Other orders -     omeprazole (PRILOSEC) 20 MG capsule; Take 1 capsule (20 mg total) by mouth daily.

## 2019-11-09 NOTE — Progress Notes (Signed)
Here for meds refils

## 2019-11-09 NOTE — Assessment & Plan Note (Signed)
For now plan is to hold off on umbilical hernia repair until the lipoma is addressed

## 2019-11-09 NOTE — Assessment & Plan Note (Signed)
Hyperlipidemia Patient is currently on atorvastatin 40 mg daily we will continue same

## 2019-11-09 NOTE — Assessment & Plan Note (Signed)
Type 2 diabetes with retinopathy recent hemoglobin A1c was improving we will continue current program of Levemir 40 units twice daily, Trulicity 75 mcg weekly, Metformin at 1000 mg twice daily, and Farxiga 5 mg daily

## 2019-11-09 NOTE — Patient Instructions (Signed)
Refills on stomach acid medication sent to pharmacy No change in medications Keep plastic surgery appointment  Return 4 months

## 2019-11-10 LAB — BASIC METABOLIC PANEL
BUN/Creatinine Ratio: 17 (ref 9–20)
BUN: 21 mg/dL (ref 6–24)
CO2: 24 mmol/L (ref 20–29)
Calcium: 9.5 mg/dL (ref 8.7–10.2)
Chloride: 103 mmol/L (ref 96–106)
Creatinine, Ser: 1.24 mg/dL (ref 0.76–1.27)
GFR calc Af Amer: 75 mL/min/{1.73_m2} (ref >59)
GFR calc non Af Amer: 65 mL/min/{1.73_m2} (ref >59)
Glucose: 152 mg/dL — ABNORMAL HIGH (ref 65–99)
Potassium: 3.9 mmol/L (ref 3.5–5.2)
Sodium: 141 mmol/L (ref 134–144)

## 2019-11-15 ENCOUNTER — Other Ambulatory Visit: Payer: Self-pay | Admitting: Critical Care Medicine

## 2019-11-15 DIAGNOSIS — Z794 Long term (current) use of insulin: Secondary | ICD-10-CM

## 2019-11-15 DIAGNOSIS — E11319 Type 2 diabetes mellitus with unspecified diabetic retinopathy without macular edema: Secondary | ICD-10-CM

## 2019-11-16 ENCOUNTER — Other Ambulatory Visit: Payer: Self-pay | Admitting: Critical Care Medicine

## 2019-11-16 DIAGNOSIS — Z794 Long term (current) use of insulin: Secondary | ICD-10-CM

## 2019-11-16 DIAGNOSIS — E11319 Type 2 diabetes mellitus with unspecified diabetic retinopathy without macular edema: Secondary | ICD-10-CM

## 2019-11-16 NOTE — Telephone Encounter (Signed)
Spoke with pharmacy and they already have script

## 2019-11-16 NOTE — Telephone Encounter (Signed)
Medication Refill - Medication: glucose blood (ONETOUCH VERIO) test strip    Preferred Pharmacy (with phone number or street name):  [facility] - [place], [place] - [address] Phone:  [phone]  Fax:  [phone]       Agent: Please be advised that RX refills may take up to 3 business days. We ask that you follow-up with your pharmacy.

## 2019-11-29 ENCOUNTER — Ambulatory Visit: Payer: BC Managed Care – PPO | Attending: Family Medicine | Admitting: Family Medicine

## 2019-11-29 ENCOUNTER — Encounter: Payer: Self-pay | Admitting: Family Medicine

## 2019-11-29 ENCOUNTER — Other Ambulatory Visit: Payer: Self-pay

## 2019-11-29 VITALS — BP 133/73 | HR 60 | Ht 71.0 in | Wt 329.4 lb

## 2019-11-29 DIAGNOSIS — M25572 Pain in left ankle and joints of left foot: Secondary | ICD-10-CM

## 2019-11-29 DIAGNOSIS — M25472 Effusion, left ankle: Secondary | ICD-10-CM | POA: Diagnosis not present

## 2019-11-29 MED ORDER — FUROSEMIDE 20 MG PO TABS: 20.0000 mg | ORAL_TABLET | Freq: Every day | ORAL | 0 refills | Status: DC

## 2019-11-29 NOTE — Progress Notes (Signed)
Subjective:  Patient ID: Justin Houston, male    DOB: 1966-02-22  Age: 54 y.o. MRN: [id number]  CC: Ankle Pain   HPI Justin Houston is a 54 year old male with a history of type 2 diabetes mellitus (A1c 6.1), hypertension, dyslipidemia here for an acute visit.  For the last 1 week, his left ankle has been swollen. He notices the swelling after he has been on his feet and this resolves after he has been off his feet. This not does not occur in his R ankle. Swelling is usually accompanied by tenderness when he bears weight  and symptoms improved with Aleve ; he noticed his L ankle is warm. He does have a history of Gout and knows what a Gout flare feels like and would use Colchicine prn and states this feels different from a Gout flare. Sometimes has pain in L leg.  Past Medical History:  Diagnosis Date  . Dermatitis   . Diabetes mellitus   . GERD (gastroesophageal reflux disease)   . Hyperlipidemia   . Hypertension   . Lipoma of scalp   . Obesity   . Toenail fungus 08/31/2018    Past Surgical History:  Procedure Laterality Date  . COLONOSCOPY WITH PROPOFOL N/A 12/01/2014   Procedure: COLONOSCOPY WITH PROPOFOL;  Surgeon: [name], MD;  Location: [facility];  Service: Endoscopy;  Laterality: N/A;  . right arm ORIF      Family History  Problem Relation Age of Onset  . Stroke Mother   . Hypertension Mother   . Diabetes Maternal Grandmother   . Prostate cancer Maternal Grandfather   . Diabetes Other     Allergies  Allergen Reactions  . Cozaar [Losartan Potassium]     Hives   . Losartan Potassium-Hctz Hives    Outpatient Medications Prior to Visit  Medication Sig Dispense Refill  . ALPHAGAN P 0.1 % SOLN Place 1 drop into both eyes 2 (two) times daily. 15 mL 1  . amLODipine (NORVASC) 10 MG tablet Take 1 tablet (10 mg total) by mouth daily. 30 tablet 2  . atenolol (TENORMIN) 50 MG tablet TAKE 1 TABLET BY MOUTH EVERY DAY 30 tablet 3  . atorvastatin (LIPITOR) 40  MG tablet Take 1 tablet (40 mg total) by mouth daily. 30 tablet 3  . Blood Glucose Monitoring Suppl (ONETOUCH VERIO) w/Device KIT Use as instructed to check blood sugar 3 times daily. E11.319 Z79.4 1 kit 0  . chlorthalidone (HYGROTON) 25 MG tablet Take 1 tablet (25 mg total) by mouth daily. 30 tablet 2  . clobetasol ointment (TEMOVATE) 3.57 % Apply 1 application topically 3 (three) times daily as needed (rash.). 60 g 1  . clotrimazole-betamethasone (LOTRISONE) cream Apply 1 application topically 2 (two) times daily. 30 g 3  . colchicine 0.6 MG tablet Take 1 tablet (0.6 mg total) by mouth 2 (two) times daily as needed. 20 tablet 1  . dapagliflozin propanediol (FARXIGA) 5 MG TABS tablet Take 1 tablet (5 mg total) by mouth daily. 30 tablet 2  . Dulaglutide (TRULICITY) 0.17 BL/3.9QZ SOPN Inject 0.5 mLs (0.75 mg total) into the skin once a week. 2 mL 1  . glucose blood (ONETOUCH VERIO) test strip USE AS INSTRUCTED TO CHECK BLOOD SUGAR 3 TIMES DAILY 100 strip 11  . hydrALAZINE (APRESOLINE) 50 MG tablet Take 1 tablet (50 mg total) by mouth 3 (three) times daily. 90 tablet 3  . insulin detemir (LEVEMIR) 100 UNIT/ML injection INJECT 40 UNITS SUBCUTANEOUSLY 2 TIMES DAILY  20 mL 2  . Insulin Pen Needle 30G X 5 MM MISC 1 Stick by Does not apply route daily. 100 each 11  . Insulin Syringe-Needle U-100 (TRUEPLUS INSULIN SYRINGE) 31G X 5/16" 1 ML MISC Use to inject insulin twice daily as directed. 100 each 11  . metFORMIN (GLUCOPHAGE) 1000 MG tablet TAKE 1 TABLET BY MOUTH 2 TIMES DAILY WITH MEALS 60 tablet 2  . omeprazole (PRILOSEC) 20 MG capsule Take 1 capsule (20 mg total) by mouth daily. 30 capsule 4  . OneTouch Delica Lancets 50V MISC Use as instructed to check blood sugar 3 times daily. E11.319 Z79.4 100 each 11  . STIMULANT LAXATIVE 8.6-50 MG tablet TAKE 1 TABLET BY MOUTH DAILY 30 tablet 1   No facility-administered medications prior to visit.     ROS Review of Systems  Constitutional: Negative for  activity change and appetite change.  HENT: Negative for sinus pressure and sore throat.   Eyes: Negative for visual disturbance.  Respiratory: Negative for cough, chest tightness and shortness of breath.   Cardiovascular: Negative for chest pain and leg swelling.  Gastrointestinal: Negative for abdominal distention, abdominal pain, constipation and diarrhea.  Endocrine: Negative.   Genitourinary: Negative for dysuria.  Musculoskeletal:       See HPI  Skin: Negative for rash.  Allergic/Immunologic: Negative.   Neurological: Negative for weakness, light-headedness and numbness.  Psychiatric/Behavioral: Negative for dysphoric mood and suicidal ideas.    Objective:  BP 133/73   Pulse 60   Ht 5' 11"  (1.803 m)   Wt (!) 329 lb 6.4 oz (149.4 kg)   SpO2 99%   BMI 45.94 kg/m   BP/Weight 11/29/2019 6/97/9480 04/21/5535  Systolic BP 482 707 867  Diastolic BP 73 81 82  Wt. (Lbs) 329.4 327 -  BMI 45.94 45.61 -      Physical Exam Constitutional:      Appearance: He is well-developed.  Neck:     Vascular: No JVD.  Cardiovascular:     Rate and Rhythm: Normal rate.     Heart sounds: Normal heart sounds. No murmur heard.   Pulmonary:     Effort: Pulmonary effort is normal.     Breath sounds: Normal breath sounds. No wheezing or rales.  Chest:     Chest wall: No tenderness.  Abdominal:     General: Bowel sounds are normal. There is no distension.     Palpations: Abdomen is soft. There is no mass.     Tenderness: There is no abdominal tenderness.  Musculoskeletal:     Right lower leg: No edema.     Left lower leg: Edema (3+ L ankle edema extending to lower fourth of leg) present.     Comments: Slight warmth on palpation of left ankle  Neurological:     Mental Status: He is alert and oriented to person, place, and time.  Psychiatric:        Mood and Affect: Mood normal.     CMP Latest Ref Rng & Units 11/09/2019 03/08/2019 10/07/2018  Glucose 65 - 99 mg/dL 152(H) 126(H) 159(H)   BUN 6 - 24 mg/dL 21 18 12   Creatinine 0.76 - 1.27 mg/dL 1.24 1.14 1.08  Sodium 134 - 144 mmol/L 141 140 144  Potassium 3.5 - 5.2 mmol/L 3.9 4.1 3.4(L)  Chloride 96 - 106 mmol/L 103 101 101  CO2 20 - 29 mmol/L 24 21 21   Calcium 8.7 - 10.2 mg/dL 9.5 9.5 9.6  Total Protein 6.0 - 8.5 g/dL -  7.2 7.0  Total Bilirubin 0.0 - 1.2 mg/dL - 1.1 1.1  Alkaline Phos 39 - 117 IU/L - 101 95  AST 0 - 40 IU/L - 17 18  ALT 0 - 44 IU/L - 12 21    Lipid Panel     Component Value Date/Time   CHOL 125 10/07/2018 1146   TRIG 109 10/07/2018 1146   HDL 32 (L) 10/07/2018 1146   CHOLHDL 3.9 10/07/2018 1146   CHOLHDL 3.8 01/16/2016 1619   VLDL 23 01/16/2016 1619   LDLCALC 71 10/07/2018 1146    CBC    Component Value Date/Time   WBC 5.1 07/12/2017 1023   WBC 6.0 01/16/2016 1619   RBC 5.04 07/12/2017 1023   RBC 4.97 01/16/2016 1619   HGB 15.4 07/12/2017 1023   HCT 45.9 07/12/2017 1023   PLT 179 07/12/2017 1023   MCV 91 07/12/2017 1023   MCH 30.6 07/12/2017 1023   MCH 30.2 01/16/2016 1619   MCHC 33.6 07/12/2017 1023   MCHC 34.4 01/16/2016 1619   RDW 14.1 07/12/2017 1023   LYMPHSABS 1.9 07/12/2017 1023   MONOABS 420 01/16/2016 1619   EOSABS 0.1 07/12/2017 1023   BASOSABS 0.0 07/12/2017 1023    Lab Results  Component Value Date   HGBA1C 6.1 (A) 10/05/2019    Assessment & Plan:  1. Pain and swelling of left ankle Suspicious for gout flare We will check labs and if in keeping a gout flare place him on prednisone, colchicine If uric acid is normal consider imaging of left ankle Placed on short course of Lasix due to edema He does have NSAIDs which have been beneficial and he has been advised to use that for pain. - Uric Acid - furosemide (LASIX) 20 MG tablet; Take 1 tablet (20 mg total) by mouth daily.  Dispense: 5 tablet; Refill: 0 - CBC with Differential/Platelet  Meds ordered this encounter  Medications  . furosemide (LASIX) 20 MG tablet    Sig: Take 1 tablet (20 mg total) by mouth  daily.    Dispense:  5 tablet    Refill:  0    Follow-up: Return for Medical conditions, keep previously scheduled appointment.       Charlott Rakes, MD, FAAFP. [facility] [place], [place]   11/29/2019, 2:09 PM

## 2019-11-29 NOTE — Progress Notes (Signed)
Having pain in left ankle, patient states that he did not have a fall.

## 2019-11-29 NOTE — Patient Instructions (Signed)
Edema  Edema is when you have too much fluid in your body or under your skin. Edema may make your legs, feet, and ankles swell up. Swelling is also common in looser tissues, like around your eyes. This is a common condition. It gets more common as you get older. There are many possible causes of edema. Eating too much salt (sodium) and being on your feet or sitting for a long time can cause edema in your legs, feet, and ankles. Hot weather may make edema worse. Edema is usually painless. Your skin may look swollen or shiny. Follow these instructions at home:  Keep the swollen body part raised (elevated) above the level of your heart when you are sitting or lying down.  Do not sit still or stand for a long time.  Do not wear tight clothes. Do not wear garters on your upper legs.  Exercise your legs. This can help the swelling go down.  Wear elastic bandages or support stockings as told by your doctor.  Eat a low-salt (low-sodium) diet to reduce fluid as told by your doctor.  Depending on the cause of your swelling, you may need to limit how much fluid you drink (fluid restriction).  Take over-the-counter and prescription medicines only as told by your doctor. Contact a doctor if:  Treatment is not working.  You have heart, liver, or kidney disease and have symptoms of edema.  You have sudden and unexplained weight gain. Get help right away if:  You have shortness of breath or chest pain.  You cannot breathe when you lie down.  You have pain, redness, or warmth in the swollen areas.  You have heart, liver, or kidney disease and get edema all of a sudden.  You have a fever and your symptoms get worse all of a sudden. Summary  Edema is when you have too much fluid in your body or under your skin.  Edema may make your legs, feet, and ankles swell up. Swelling is also common in looser tissues, like around your eyes.  Raise (elevate) the swollen body part above the level of your  heart when you are sitting or lying down.  Follow your doctor's instructions about diet and how much fluid you can drink (fluid restriction). This information is not intended to replace advice given to you by your health care provider. Make sure you discuss any questions you have with your health care provider. Document Revised: 04/04/2017 Document Reviewed: 04/19/2016 Elsevier Patient Education  2020 [employer] Inc.  

## 2019-11-30 ENCOUNTER — Other Ambulatory Visit: Payer: Self-pay | Admitting: Family Medicine

## 2019-11-30 DIAGNOSIS — M25572 Pain in left ankle and joints of left foot: Secondary | ICD-10-CM

## 2019-11-30 LAB — CBC WITH DIFFERENTIAL/PLATELET
Basophils Absolute: 0 10*3/uL (ref 0.0–0.2)
Basos: 1 %
EOS (ABSOLUTE): 0.1 10*3/uL (ref 0.0–0.4)
Eos: 1 %
Hematocrit: 44.8 % (ref 37.5–51.0)
Hemoglobin: 14.9 g/dL (ref 13.0–17.7)
Immature Grans (Abs): 0 10*3/uL (ref 0.0–0.1)
Immature Granulocytes: 0 %
Lymphocytes Absolute: 2.1 10*3/uL (ref 0.7–3.1)
Lymphs: 35 %
MCH: 30.5 pg (ref 26.6–33.0)
MCHC: 33.3 g/dL (ref 31.5–35.7)
MCV: 92 fL (ref 79–97)
Monocytes Absolute: 0.5 10*3/uL (ref 0.1–0.9)
Monocytes: 8 %
Neutrophils Absolute: 3.4 10*3/uL (ref 1.4–7.0)
Neutrophils: 55 %
Platelets: 207 10*3/uL (ref 150–450)
RBC: 4.89 x10E6/uL (ref 4.14–5.80)
RDW: 12.8 % (ref 11.6–15.4)
WBC: 6.1 10*3/uL (ref 3.4–10.8)

## 2019-11-30 LAB — URIC ACID: Uric Acid: 8.4 mg/dL (ref 3.8–8.4)

## 2019-12-02 ENCOUNTER — Telehealth: Payer: Self-pay

## 2019-12-02 NOTE — Telephone Encounter (Signed)
Patient was called and a voicemail was left informing patient to return phone call for lab results. 

## 2019-12-02 NOTE — Telephone Encounter (Signed)
-----   Message from Charlott Rakes, MD sent at 11/30/2019  2:04 PM EDT ----- Imaging does not reveal evidence of gout flare.  I have ordered an x-ray of his ankle and he can go to the hospital to have this done.

## 2019-12-13 ENCOUNTER — Other Ambulatory Visit: Payer: Self-pay | Admitting: Critical Care Medicine

## 2019-12-13 DIAGNOSIS — I10 Essential (primary) hypertension: Secondary | ICD-10-CM

## 2019-12-13 NOTE — Telephone Encounter (Signed)
Requested Prescriptions  Pending Prescriptions Disp Refills  . amLODipine (NORVASC) 10 MG tablet [Pharmacy Med Name: amlodipine 10 mg tablet] 90 tablet 0    Sig: TAKE 1 TABLET BY MOUTH EVERY DAY     Cardiovascular:  Calcium Channel Blockers Passed - 12/13/2019  5:18 PM      Passed - Last BP in normal range    BP Readings from Last 1 Encounters:  11/29/19 133/73         Passed - Valid encounter within last 6 months    Recent Outpatient Visits          2 weeks ago Pain and swelling of left ankle   [place], Charlane Ferretti, MD   1 month ago Diabetes mellitus type 2 in obese [facility])   [place] [name], MD   2 months ago Umbilical hernia, incarcerated   [place], Jeneen Rinks, MD   2 months ago Type 2 diabetes mellitus with retinopathy, with long-term current use of insulin, macular edema presence unspecified, unspecified laterality, unspecified retinopathy severity [facility])   [place] [name], MD   9 months ago Diabetes mellitus type 2 in obese [facility])   [place] [name], MD      Future Appointments            In 3 months Joya Gaskins Burnett Harry, MD [place]

## 2019-12-17 ENCOUNTER — Telehealth: Payer: Self-pay | Admitting: Critical Care Medicine

## 2019-12-17 NOTE — Telephone Encounter (Signed)
Called and LVM to return call and schedule an appt with first available provider.   Copied from [place] [id number]. Topic: Appointment Scheduling - Scheduling Inquiry for Clinic >> Dec 17, 2019  3:37 PM Yvette Rack wrote: Reason for CRM: Pt request appt with Dr. Joya Gaskins due to some ankle pain and leg is swelling. Offered pt first available appt but pt requests sooner appt with Dr. Joya Gaskins. Pt requests call back

## 2019-12-17 NOTE — Telephone Encounter (Signed)
Copied from [place] [id number]. Topic: General - Other >> Dec 15, 2019  8:50 AM Leward Quan A wrote: Reason for CRM: Patient called to inform Dr Joya Gaskins and Dr Margarita Rana that he is now starting to have swelling in his right ankle as well but will be going for X-rays. Per patient he just wanted the Dr's to be aware >> Dec 17, 2019  3:36 PM Wynetta Emery, [place] C wrote: Pt called in to update provider that he is now having some swelling on the right side of his left ankle also pt says that he has seen provider for this concern. Pt would like to make providers aware and would like to discuss further . Pt says that he will go for imaging next week.

## 2019-12-23 ENCOUNTER — Ambulatory Visit (INDEPENDENT_AMBULATORY_CARE_PROVIDER_SITE_OTHER): Payer: BC Managed Care – PPO | Admitting: Plastic Surgery

## 2019-12-23 ENCOUNTER — Ambulatory Visit (HOSPITAL_COMMUNITY)
Admission: RE | Admit: 2019-12-23 | Discharge: 2019-12-23 | Disposition: A | Discharge status: Home / Self Care | Payer: BC Managed Care – PPO | Source: Ambulatory Visit | Attending: Family Medicine | Admitting: Family Medicine

## 2019-12-23 ENCOUNTER — Encounter: Payer: Self-pay | Admitting: Plastic Surgery

## 2019-12-23 ENCOUNTER — Other Ambulatory Visit: Payer: Self-pay | Admitting: Critical Care Medicine

## 2019-12-23 ENCOUNTER — Other Ambulatory Visit: Payer: Self-pay

## 2019-12-23 ENCOUNTER — Other Ambulatory Visit (HOSPITAL_COMMUNITY): Payer: Self-pay | Admitting: Dermatology

## 2019-12-23 VITALS — BP 172/85 | HR 59 | Temp 98.1°F | Ht 71.0 in | Wt 329.0 lb

## 2019-12-23 DIAGNOSIS — D170 Benign lipomatous neoplasm of skin and subcutaneous tissue of head, face and neck: Secondary | ICD-10-CM | POA: Diagnosis not present

## 2019-12-23 DIAGNOSIS — M25572 Pain in left ankle and joints of left foot: Secondary | ICD-10-CM

## 2019-12-23 DIAGNOSIS — E669 Obesity, unspecified: Secondary | ICD-10-CM

## 2019-12-23 NOTE — Progress Notes (Signed)
Referring Provider Justin Stain, MD [address],  [place] [address]   CC:  Chief Complaint  Patient presents with  . Advice Only      Justin Houston is an 54 y.o. male.  HPI: Patient presents to discuss her right scalp mass.  Is been present for a number of years and gradually getting bigger.  It is becoming more symptomatic as it gets larger and it hurts when he lays on it.  He is unable to wear hats comfortably.  He would like to have it removed.  Allergies  Allergen Reactions  . Cozaar [Losartan Potassium]     Hives   . Losartan Potassium-Hctz Hives    Outpatient Encounter Medications as of 12/23/2019  Medication Sig  . ALPHAGAN P 0.1 % SOLN Place 1 drop into both eyes 2 (two) times daily.  Marland Kitchen amLODipine (NORVASC) 10 MG tablet TAKE 1 TABLET BY MOUTH EVERY DAY  . atenolol (TENORMIN) 50 MG tablet TAKE 1 TABLET BY MOUTH EVERY DAY  . atorvastatin (LIPITOR) 40 MG tablet Take 1 tablet (40 mg total) by mouth daily.  . Blood Glucose Monitoring Suppl (ONETOUCH VERIO) w/Device KIT Use as instructed to check blood sugar 3 times daily. E11.319 Z79.4  . chlorthalidone (HYGROTON) 25 MG tablet Take 1 tablet (25 mg total) by mouth daily.  . clobetasol ointment (TEMOVATE) 2.44 % Apply 1 application topically 3 (three) times daily as needed (rash.).  Marland Kitchen clotrimazole-betamethasone (LOTRISONE) cream Apply 1 application topically 2 (two) times daily.  . colchicine 0.6 MG tablet Take 1 tablet (0.6 mg total) by mouth 2 (two) times daily as needed.  . dapagliflozin propanediol (FARXIGA) 5 MG TABS tablet Take 1 tablet (5 mg total) by mouth daily.  . Dulaglutide (TRULICITY) 0.10 UV/2.5DG SOPN Inject 0.5 mLs (0.75 mg total) into the skin once a week.  . furosemide (LASIX) 20 MG tablet Take 1 tablet (20 mg total) by mouth daily.  Marland Kitchen glucose blood (ONETOUCH VERIO) test strip USE AS INSTRUCTED TO CHECK BLOOD SUGAR 3 TIMES DAILY  . hydrALAZINE (APRESOLINE) 50 MG tablet Take 1 tablet (50 mg  total) by mouth 3 (three) times daily.  . insulin detemir (LEVEMIR) 100 UNIT/ML injection INJECT 40 UNITS SUBCUTANEOUSLY 2 TIMES DAILY  . Insulin Syringe-Needle U-100 (TRUEPLUS INSULIN SYRINGE) 31G X 5/16" 1 ML MISC Use to inject insulin twice daily as directed.  . metFORMIN (GLUCOPHAGE) 1000 MG tablet TAKE 1 TABLET BY MOUTH 2 TIMES DAILY WITH MEALS  . omeprazole (PRILOSEC) 20 MG capsule Take 1 capsule (20 mg total) by mouth daily.  Glory Rosebush Delica Lancets 64Q MISC Use as instructed to check blood sugar 3 times daily. E11.319 Z79.4  . STIMULANT LAXATIVE 8.6-50 MG tablet TAKE 1 TABLET BY MOUTH DAILY  . Insulin Pen Needle 30G X 5 MM MISC 1 Stick by Does not apply route daily. (Patient not taking: Reported on 12/23/2019)   No facility-administered encounter medications on file as of 12/23/2019.     Past Medical History:  Diagnosis Date  . Dermatitis   . Diabetes mellitus   . GERD (gastroesophageal reflux disease)   . Hyperlipidemia   . Hypertension   . Lipoma of scalp   . Obesity   . Toenail fungus 08/31/2018    Past Surgical History:  Procedure Laterality Date  . COLONOSCOPY WITH PROPOFOL N/A 12/01/2014   Procedure: COLONOSCOPY WITH PROPOFOL;  Surgeon: [name], MD;  Location: [facility];  Service: Endoscopy;  Laterality: N/A;  .  right arm ORIF      Family History  Problem Relation Age of Onset  . Stroke Mother   . Hypertension Mother   . Diabetes Maternal Grandmother   . Prostate cancer Maternal Grandfather   . Diabetes Other     Social History   Social History Narrative   Master's Degree in Adult Education.  Lives alone.   No siblings.     Review of Systems General: Denies fevers, chills, weight loss CV: Denies chest pain, shortness of breath, palpitations  Physical Exam Vitals with BMI 12/23/2019 11/29/2019 11/09/2019  Height 5' 11"  5' 11"  5' 11"   Weight 329 lbs 329 lbs 6 oz 327 lbs  BMI 45.91 53.20 23.34  Systolic 356 861 683  Diastolic 85 73 81  Pulse 59  60 71    General:  No acute distress,  Alert and oriented, Non-Toxic, Normal speech and affect Examination shows a 12 cm soft tissue mass in the right posterior auricular area extending onto the parietal scalp.  It feels freely mobile.  The overlying skin has slightly diminished hair growth compared to the rest of his scalp but is otherwise normal-appearing.  Assessment/Plan Patient presents with a large right scalp lipoma.  We discussed excision.  We discussed the risks include bleeding, infection, damage to surrounding structures and need for additional procedures.  Patient fully understanding and wants to proceed.  I think this is large enough and would be best done in the operating room and he agrees.  We will plan to set this up for him soon as possible.  Justin Houston 12/23/2019, 9:35 AM

## 2019-12-23 NOTE — Telephone Encounter (Signed)
I ordered an xray for him on 11/30/19 to evaluate this further and he is yet to have this done

## 2019-12-24 ENCOUNTER — Telehealth: Payer: Self-pay

## 2019-12-24 NOTE — Telephone Encounter (Signed)
-----   Message from Charlott Rakes, MD sent at 12/23/2019  5:32 PM EDT ----- Justin Houston reveals soft tissue swelling and an old fifth toe fracture. He does have arthritis in the ankle.

## 2019-12-24 NOTE — Telephone Encounter (Signed)
Patient was called and informed of lab results via voicemail. 

## 2019-12-27 ENCOUNTER — Telehealth: Payer: Self-pay | Admitting: Critical Care Medicine

## 2019-12-27 NOTE — Telephone Encounter (Signed)
Copied from [place] [phone]. Topic: General - Other >> Dec 27, 2019  1:06 PM Keene Breath wrote: Reason for CRM: Called to ask the nurse or doctor to call regarding what the doctor recommends for his swelling after his test results.  Please advise and call to discuss at [phone]

## 2019-12-27 NOTE — Telephone Encounter (Signed)
Patient is still having swelling and pain in his ankle after completing the medication.

## 2019-12-27 NOTE — Telephone Encounter (Signed)
I saw him for an acute visit. Needs to schedule with regular Clinician.

## 2019-12-29 NOTE — Telephone Encounter (Signed)
Please schedule appointment with any available provider.

## 2019-12-29 NOTE — Telephone Encounter (Signed)
Called patient and LVM to return call and schedule an appt with any provider for his swelling.

## 2020-01-07 ENCOUNTER — Other Ambulatory Visit: Payer: Self-pay | Admitting: Critical Care Medicine

## 2020-01-07 DIAGNOSIS — E669 Obesity, unspecified: Secondary | ICD-10-CM

## 2020-01-11 NOTE — H&P (View-Only) (Signed)
ICD-10-CM   1. Lipoma of scalp  D17.0       Patient ID: Justin Houston, male    DOB: 1965/08/26, 54 y.o.   MRN: [id number]   History of Present Illness: Justin Houston is a 54 y.o.  male  with a history of right scalp mass.  He presents for preoperative evaluation for upcoming procedure, excision of right scalp lipoma, scheduled for 01/28/2020 with Dr. Claudia Desanctis.  Summary from previous visit: Patient has mass on his right scalp.  It has been present for a number of years and continues to increase in size.  It is becoming more symptomatic as it gets larger and hurts when he lays on it.  He is unable to wear hats comfortably.  It is a 12 cm soft tissue mass in the right posterior auricular area extending onto the parietal scalp.  It is freely mobile.  Overlying skin has slightly diminished hair growth compared to the rest of the scalp but is otherwise normal-appearing.  PMH Significant for: DM, HLD, HTN, GERD, dermatitis  The patient has not had problems with anesthesia.   Past Medical History: Allergies: Allergies  Allergen Reactions  . Cozaar [Losartan Potassium]     Hives   . Losartan Potassium-Hctz Hives    Current Medications:  Current Outpatient Medications:  .  ALPHAGAN P 0.1 % SOLN, Place 1 drop into both eyes 2 (two) times daily., Disp: 15 mL, Rfl: 1 .  amLODipine (NORVASC) 10 MG tablet, TAKE 1 TABLET BY MOUTH EVERY DAY, Disp: 90 tablet, Rfl: 0 .  atenolol (TENORMIN) 50 MG tablet, TAKE 1 TABLET BY MOUTH EVERY DAY, Disp: 30 tablet, Rfl: 3 .  atorvastatin (LIPITOR) 40 MG tablet, Take 1 tablet (40 mg total) by mouth daily., Disp: 30 tablet, Rfl: 3 .  Blood Glucose Monitoring Suppl (ONETOUCH VERIO) w/Device KIT, Use as instructed to check blood sugar 3 times daily. E11.319 Z79.4, Disp: 1 kit, Rfl: 0 .  chlorthalidone (HYGROTON) 25 MG tablet, Take 1 tablet (25 mg total) by mouth daily., Disp: 30 tablet, Rfl: 2 .  clotrimazole-betamethasone (LOTRISONE) cream, Apply 1  application topically 2 (two) times daily., Disp: 30 g, Rfl: 3 .  colchicine 0.6 MG tablet, Take 1 tablet (0.6 mg total) by mouth 2 (two) times daily as needed., Disp: 20 tablet, Rfl: 1 .  Dulaglutide (TRULICITY) 0.48 GQ/9.1QX SOPN, Inject 0.5 mLs (0.75 mg total) into the skin once a week., Disp: 2 mL, Rfl: 1 .  FARXIGA 5 MG TABS tablet, TAKE 1 TABLET BY MOUTH EVERY DAY, Disp: 30 tablet, Rfl: 2 .  glucose blood (ONETOUCH VERIO) test strip, USE AS INSTRUCTED TO CHECK BLOOD SUGAR 3 TIMES DAILY, Disp: 100 strip, Rfl: 11 .  hydrALAZINE (APRESOLINE) 50 MG tablet, Take 1 tablet (50 mg total) by mouth 3 (three) times daily., Disp: 90 tablet, Rfl: 3 .  insulin detemir (LEVEMIR) 100 UNIT/ML injection, INJECT 40 UNITS SUBCUTANEOUSLY 2 TIMES DAILY, Disp: 20 mL, Rfl: 2 .  Insulin Syringe-Needle U-100 (TRUEPLUS INSULIN SYRINGE) 31G X 5/16" 1 ML MISC, Use to inject insulin twice daily as directed., Disp: 100 each, Rfl: 11 .  metFORMIN (GLUCOPHAGE) 1000 MG tablet, TAKE 1 TABLET BY MOUTH 2 TIMES DAILY WITH MEALS, Disp: 60 tablet, Rfl: 1 .  omeprazole (PRILOSEC) 20 MG capsule, Take 1 capsule (20 mg total) by mouth daily., Disp: 30 capsule, Rfl: 4 .  OneTouch Delica Lancets 45W MISC, Use as instructed to check blood sugar 3 times daily. E11.319  Z79.4, Disp: 100 each, Rfl: 11 .  STIMULANT LAXATIVE 8.6-50 MG tablet, TAKE 1 TABLET BY MOUTH DAILY, Disp: 30 tablet, Rfl: 1 .  clobetasol ointment (TEMOVATE) 5.17 %, Apply 1 application topically 3 (three) times daily as needed (rash.). (Patient not taking: Reported on 01/13/2020), Disp: 60 g, Rfl: 1 .  furosemide (LASIX) 20 MG tablet, Take 1 tablet (20 mg total) by mouth daily. (Patient not taking: Reported on 01/13/2020), Disp: 5 tablet, Rfl: 0 .  HYDROcodone-acetaminophen (NORCO) 5-325 MG tablet, Take 1 tablet by mouth every 8 (eight) hours as needed for up to 3 days for severe pain. For use AFTER Surgery, Disp: 9 tablet, Rfl: 0 .  Insulin Pen Needle 30G X 5 MM MISC, 1 Stick  by Does not apply route daily. (Patient not taking: Reported on 01/13/2020), Disp: 100 each, Rfl: 11  Past Medical Problems: Past Medical History:  Diagnosis Date  . Dermatitis   . Diabetes mellitus   . GERD (gastroesophageal reflux disease)   . Hyperlipidemia   . Hypertension   . Lipoma of scalp   . Obesity   . Toenail fungus 08/31/2018    Past Surgical History: Past Surgical History:  Procedure Laterality Date  . COLONOSCOPY WITH PROPOFOL N/A 12/01/2014   Procedure: COLONOSCOPY WITH PROPOFOL;  Surgeon: [name], MD;  Location: [facility];  Service: Endoscopy;  Laterality: N/A;  . right arm ORIF      Social History: Social History   Socioeconomic History  . Marital status: Single    Spouse name: n/a  . Number of children: 0  . Years of education: 43  . Highest education level: Not on file  Occupational History  . Occupation: Software engineer: [employer]  Tobacco Use  . Smoking status: Former Smoker    Quit date: 09/05/1994    Years since quitting: 25.3  . Smokeless tobacco: Never Used  Vaping Use  . Vaping Use: Never used  Substance and Sexual Activity  . Alcohol use: No    Alcohol/week: 0.0 standard drinks  . Drug use: No  . Sexual activity: Never  Other Topics Concern  . Not on file  Social History Narrative   Master's Degree in Adult Education.  Lives alone.   No siblings.   Social Determinants of Health   Financial Resource Strain:   . Difficulty of Paying Living Expenses: Not on file  Food Insecurity:   . Worried About Charity fundraiser in the Last Year: Not on file  . Ran Out of Food in the Last Year: Not on file  Transportation Needs:   . Lack of Transportation (Medical): Not on file  . Lack of Transportation (Non-Medical): Not on file  Physical Activity:   . Days of Exercise per Week: Not on file  . Minutes of Exercise per Session: Not on file  Stress:   . Feeling of Stress : Not on file  Social  Connections:   . Frequency of Communication with Friends and Family: Not on file  . Frequency of Social Gatherings with Friends and Family: Not on file  . Attends Religious Services: Not on file  . Active Member of Clubs or Organizations: Not on file  . Attends Archivist Meetings: Not on file  . Marital Status: Not on file  Intimate Partner Violence:   . Fear of Current or Ex-Partner: Not on file  . Emotionally Abused: Not on file  . Physically Abused: Not on file  .  Sexually Abused: Not on file    Family History: Family History  Problem Relation Age of Onset  . Stroke Mother   . Hypertension Mother   . Diabetes Maternal Grandmother   . Prostate cancer Maternal Grandfather   . Diabetes Other     Review of Systems: Review of Systems  Constitutional: Negative for chills and fever.  HENT: Negative for congestion and sore throat.        Large mass on right side of head  Respiratory: Negative for cough and shortness of breath.   Cardiovascular: Negative for chest pain and palpitations.  Gastrointestinal: Negative for abdominal pain, nausea and vomiting.  Skin: Negative for itching and rash.    Physical Exam: Vital Signs BP (!) 146/81 (BP Location: Left Arm, Patient Position: Sitting, Cuff Size: Large)   Pulse 71   Temp 98.1 F (36.7 C) (Oral)   SpO2 99%  Physical Exam Vitals and nursing note reviewed.  Constitutional:      General: He is not in acute distress.    Appearance: Normal appearance. He is obese. He is not ill-appearing.  HENT:     Head: Normocephalic and atraumatic.      Comments: Mass, right side of head. No signs of infection or drainage. Eyes:     Extraocular Movements: Extraocular movements intact.  Cardiovascular:     Rate and Rhythm: Normal rate and regular rhythm.  Pulmonary:     Effort: Pulmonary effort is normal.     Breath sounds: Normal breath sounds. No stridor. No wheezing, rhonchi or rales.  Abdominal:     General: Bowel sounds  are normal.     Palpations: Abdomen is soft.  Musculoskeletal:        General: Normal range of motion.     Cervical back: Normal range of motion.  Skin:    General: Skin is warm and dry.  Neurological:     General: No focal deficit present.     Mental Status: He is alert and oriented to person, place, and time.  Psychiatric:        Mood and Affect: Mood normal.        Behavior: Behavior normal.        Thought Content: Thought content normal.        Judgment: Judgment normal.     Assessment/Plan:  Mr. Swoyer scheduled for excision of right scalp lipoma with Dr. Claudia Desanctis.  Risks, benefits, and alternatives of procedure discussed, questions answered and consent obtained.    Smoking Status: non-smoker; Counseling Given? N/A  Caprini Score: 4 Moderate; Risk Factors include: 54 year old male, varicose veins, BMI > 25, and length of planned surgery. Recommendation for mechanical or pharmacological prophylaxis during surgery. Encourage early ambulation.   Pictures obtained: 10/07/19  Post-op Rx sent to pharmacy: [facility]  Patient was provided with the General Surgical Risk consent document and Pain Medication Agreement prior to their appointment.  They had adequate time to read through the risk consent documents and Pain Medication Agreement. We also discussed them in person together during this preop appointment. All of their questions were answered to their satisfaction.  Recommended calling if they have any further questions.  Risk consent form and Pain Medication Agreement to be scanned into patient's chart.  The [place] was signed into law in 2016 which includes the topic of electronic health records.  This provides immediate access to information in MyChart.  This includes consultation notes, operative notes, office notes, lab results and pathology reports.  If you have any questions about what you read please let us know at your next visit or call us at the office.  We are right  here with you.   Electronically signed by: Threasa Heads, PA-C 01/13/2020 9:53 AM

## 2020-01-11 NOTE — Progress Notes (Signed)
   ICD-10-CM   1. Lipoma of scalp  D17.0       Patient ID: Justin Houston, male    DOB: 07/24/1965, 54 y.o.   MRN: [id number]   History of Present Illness: Justin Houston is a 54 y.o.  male  with a history of right scalp mass.  He presents for preoperative evaluation for upcoming procedure, excision of right scalp lipoma, scheduled for 01/28/2020 with Dr. Pace.  Summary from previous visit: Patient has mass on his right scalp.  It has been present for a number of years and continues to increase in size.  It is becoming more symptomatic as it gets larger and hurts when he lays on it.  He is unable to wear hats comfortably.  It is a 12 cm soft tissue mass in the right posterior auricular area extending onto the parietal scalp.  It is freely mobile.  Overlying skin has slightly diminished hair growth compared to the rest of the scalp but is otherwise normal-appearing.  PMH Significant for: DM, HLD, HTN, GERD, dermatitis  The patient has not had problems with anesthesia.   Past Medical History: Allergies: Allergies  Allergen Reactions  . Cozaar [Losartan Potassium]     Hives   . Losartan Potassium-Hctz Hives    Current Medications:  Current Outpatient Medications:  .  ALPHAGAN P 0.1 % SOLN, Place 1 drop into both eyes 2 (two) times daily., Disp: 15 mL, Rfl: 1 .  amLODipine (NORVASC) 10 MG tablet, TAKE 1 TABLET BY MOUTH EVERY DAY, Disp: 90 tablet, Rfl: 0 .  atenolol (TENORMIN) 50 MG tablet, TAKE 1 TABLET BY MOUTH EVERY DAY, Disp: 30 tablet, Rfl: 3 .  atorvastatin (LIPITOR) 40 MG tablet, Take 1 tablet (40 mg total) by mouth daily., Disp: 30 tablet, Rfl: 3 .  Blood Glucose Monitoring Suppl (ONETOUCH VERIO) w/Device KIT, Use as instructed to check blood sugar 3 times daily. E11.319 Z79.4, Disp: 1 kit, Rfl: 0 .  chlorthalidone (HYGROTON) 25 MG tablet, Take 1 tablet (25 mg total) by mouth daily., Disp: 30 tablet, Rfl: 2 .  clotrimazole-betamethasone (LOTRISONE) cream, Apply 1  application topically 2 (two) times daily., Disp: 30 g, Rfl: 3 .  colchicine 0.6 MG tablet, Take 1 tablet (0.6 mg total) by mouth 2 (two) times daily as needed., Disp: 20 tablet, Rfl: 1 .  Dulaglutide (TRULICITY) 0.75 MG/0.5ML SOPN, Inject 0.5 mLs (0.75 mg total) into the skin once a week., Disp: 2 mL, Rfl: 1 .  FARXIGA 5 MG TABS tablet, TAKE 1 TABLET BY MOUTH EVERY DAY, Disp: 30 tablet, Rfl: 2 .  glucose blood (ONETOUCH VERIO) test strip, USE AS INSTRUCTED TO CHECK BLOOD SUGAR 3 TIMES DAILY, Disp: 100 strip, Rfl: 11 .  hydrALAZINE (APRESOLINE) 50 MG tablet, Take 1 tablet (50 mg total) by mouth 3 (three) times daily., Disp: 90 tablet, Rfl: 3 .  insulin detemir (LEVEMIR) 100 UNIT/ML injection, INJECT 40 UNITS SUBCUTANEOUSLY 2 TIMES DAILY, Disp: 20 mL, Rfl: 2 .  Insulin Syringe-Needle U-100 (TRUEPLUS INSULIN SYRINGE) 31G X 5/16" 1 ML MISC, Use to inject insulin twice daily as directed., Disp: 100 each, Rfl: 11 .  metFORMIN (GLUCOPHAGE) 1000 MG tablet, TAKE 1 TABLET BY MOUTH 2 TIMES DAILY WITH MEALS, Disp: 60 tablet, Rfl: 1 .  omeprazole (PRILOSEC) 20 MG capsule, Take 1 capsule (20 mg total) by mouth daily., Disp: 30 capsule, Rfl: 4 .  OneTouch Delica Lancets 33G MISC, Use as instructed to check blood sugar 3 times daily. E11.319   Z79.4, Disp: 100 each, Rfl: 11 .  STIMULANT LAXATIVE 8.6-50 MG tablet, TAKE 1 TABLET BY MOUTH DAILY, Disp: 30 tablet, Rfl: 1 .  clobetasol ointment (TEMOVATE) 0.05 %, Apply 1 application topically 3 (three) times daily as needed (rash.). (Patient not taking: Reported on 01/13/2020), Disp: 60 g, Rfl: 1 .  furosemide (LASIX) 20 MG tablet, Take 1 tablet (20 mg total) by mouth daily. (Patient not taking: Reported on 01/13/2020), Disp: 5 tablet, Rfl: 0 .  HYDROcodone-acetaminophen (NORCO) 5-325 MG tablet, Take 1 tablet by mouth every 8 (eight) hours as needed for up to 3 days for severe pain. For use AFTER Surgery, Disp: 9 tablet, Rfl: 0 .  Insulin Pen Needle 30G X 5 MM MISC, 1 Stick  by Does not apply route daily. (Patient not taking: Reported on 01/13/2020), Disp: 100 each, Rfl: 11  Past Medical Problems: Past Medical History:  Diagnosis Date  . Dermatitis   . Diabetes mellitus   . GERD (gastroesophageal reflux disease)   . Hyperlipidemia   . Hypertension   . Lipoma of scalp   . Obesity   . Toenail fungus 08/31/2018    Past Surgical History: Past Surgical History:  Procedure Laterality Date  . COLONOSCOPY WITH PROPOFOL N/A 12/01/2014   Procedure: COLONOSCOPY WITH PROPOFOL;  Surgeon: Daniel P Jacobs, MD;  Location: [facility];  Service: Endoscopy;  Laterality: N/A;  . right arm ORIF      Social History: Social History   Socioeconomic History  . Marital status: Single    Spouse name: n/a  . Number of children: 0  . Years of education: 18  . Highest education level: Not on file  Occupational History  . Occupation: EC Teacher Assistant    Employer: [employer]  Tobacco Use  . Smoking status: Former Smoker    Quit date: 09/05/1994    Years since quitting: 25.3  . Smokeless tobacco: Never Used  Vaping Use  . Vaping Use: Never used  Substance and Sexual Activity  . Alcohol use: No    Alcohol/week: 0.0 standard drinks  . Drug use: No  . Sexual activity: Never  Other Topics Concern  . Not on file  Social History Narrative   Master's Degree in Adult Education.  Lives alone.   No siblings.   Social Determinants of Health   Financial Resource Strain:   . Difficulty of Paying Living Expenses: Not on file  Food Insecurity:   . Worried About Running Out of Food in the Last Year: Not on file  . Ran Out of Food in the Last Year: Not on file  Transportation Needs:   . Lack of Transportation (Medical): Not on file  . Lack of Transportation (Non-Medical): Not on file  Physical Activity:   . Days of Exercise per Week: Not on file  . Minutes of Exercise per Session: Not on file  Stress:   . Feeling of Stress : Not on file  Social  Connections:   . Frequency of Communication with Friends and Family: Not on file  . Frequency of Social Gatherings with Friends and Family: Not on file  . Attends Religious Services: Not on file  . Active Member of Clubs or Organizations: Not on file  . Attends Club or Organization Meetings: Not on file  . Marital Status: Not on file  Intimate Partner Violence:   . Fear of Current or Ex-Partner: Not on file  . Emotionally Abused: Not on file  . Physically Abused: Not on file  .   Sexually Abused: Not on file    Family History: Family History  Problem Relation Age of Onset  . Stroke Mother   . Hypertension Mother   . Diabetes Maternal Grandmother   . Prostate cancer Maternal Grandfather   . Diabetes Other     Review of Systems: Review of Systems  Constitutional: Negative for chills and fever.  HENT: Negative for congestion and sore throat.        Large mass on right side of head  Respiratory: Negative for cough and shortness of breath.   Cardiovascular: Negative for chest pain and palpitations.  Gastrointestinal: Negative for abdominal pain, nausea and vomiting.  Skin: Negative for itching and rash.    Physical Exam: Vital Signs BP (!) 146/81 (BP Location: Left Arm, Patient Position: Sitting, Cuff Size: Large)   Pulse 71   Temp 98.1 F (36.7 C) (Oral)   SpO2 99%  Physical Exam Vitals and nursing note reviewed.  Constitutional:      General: He is not in acute distress.    Appearance: Normal appearance. He is obese. He is not ill-appearing.  HENT:     Head: Normocephalic and atraumatic.      Comments: Mass, right side of head. No signs of infection or drainage. Eyes:     Extraocular Movements: Extraocular movements intact.  Cardiovascular:     Rate and Rhythm: Normal rate and regular rhythm.  Pulmonary:     Effort: Pulmonary effort is normal.     Breath sounds: Normal breath sounds. No stridor. No wheezing, rhonchi or rales.  Abdominal:     General: Bowel sounds  are normal.     Palpations: Abdomen is soft.  Musculoskeletal:        General: Normal range of motion.     Cervical back: Normal range of motion.  Skin:    General: Skin is warm and dry.  Neurological:     General: No focal deficit present.     Mental Status: He is alert and oriented to person, place, and time.  Psychiatric:        Mood and Affect: Mood normal.        Behavior: Behavior normal.        Thought Content: Thought content normal.        Judgment: Judgment normal.     Assessment/Plan:  Mr. Kishi scheduled for excision of right scalp lipoma with Dr. Pace.  Risks, benefits, and alternatives of procedure discussed, questions answered and consent obtained.    Smoking Status: non-smoker; Counseling Given? N/A  Caprini Score: 4 Moderate; Risk Factors include: 54-year-old male, varicose veins, BMI > 25, and length of planned surgery. Recommendation for mechanical or pharmacological prophylaxis during surgery. Encourage early ambulation.   Pictures obtained: 10/07/19  Post-op Rx sent to pharmacy: [facility]  Patient was provided with the General Surgical Risk consent document and Pain Medication Agreement prior to their appointment.  They had adequate time to read through the risk consent documents and Pain Medication Agreement. We also discussed them in person together during this preop appointment. All of their questions were answered to their satisfaction.  Recommended calling if they have any further questions.  Risk consent form and Pain Medication Agreement to be scanned into patient's chart.  The 21st Century Cures Act was signed into law in 2016 which includes the topic of electronic health records.  This provides immediate access to information in MyChart.  This includes consultation notes, operative notes, office notes, lab results and pathology reports.    If you have any questions about what you read please let us know at your next visit or call us at the office.  We are right  here with you.   Electronically signed by: Ritika Hellickson C Koby Hartfield, PA-C 01/13/2020 9:53 AM          

## 2020-01-13 ENCOUNTER — Encounter: Payer: Self-pay | Admitting: Plastic Surgery

## 2020-01-13 ENCOUNTER — Other Ambulatory Visit: Payer: Self-pay

## 2020-01-13 ENCOUNTER — Ambulatory Visit (INDEPENDENT_AMBULATORY_CARE_PROVIDER_SITE_OTHER): Payer: BC Managed Care – PPO | Admitting: Plastic Surgery

## 2020-01-13 VITALS — BP 146/81 | HR 71 | Temp 98.1°F

## 2020-01-13 DIAGNOSIS — D170 Benign lipomatous neoplasm of skin and subcutaneous tissue of head, face and neck: Secondary | ICD-10-CM

## 2020-01-13 MED ORDER — HYDROCODONE-ACETAMINOPHEN 5-325 MG PO TABS
1.0000 | ORAL_TABLET | Freq: Three times a day (TID) | ORAL | 0 refills | Status: AC | PRN
Start: 2020-01-13 — End: 2020-01-16

## 2020-01-21 ENCOUNTER — Other Ambulatory Visit: Payer: Self-pay | Admitting: Critical Care Medicine

## 2020-01-21 DIAGNOSIS — I10 Essential (primary) hypertension: Secondary | ICD-10-CM

## 2020-01-24 ENCOUNTER — Encounter (HOSPITAL_BASED_OUTPATIENT_CLINIC_OR_DEPARTMENT_OTHER): Payer: Self-pay | Admitting: Plastic Surgery

## 2020-01-24 ENCOUNTER — Other Ambulatory Visit: Payer: Self-pay

## 2020-01-26 ENCOUNTER — Ambulatory Visit: Payer: BC Managed Care – PPO | Admitting: Physician Assistant

## 2020-01-26 ENCOUNTER — Encounter (HOSPITAL_BASED_OUTPATIENT_CLINIC_OR_DEPARTMENT_OTHER)
Admission: RE | Admit: 2020-01-26 | Discharge: 2020-01-26 | Disposition: A | Discharge status: Home / Self Care | Payer: BC Managed Care – PPO | Source: Ambulatory Visit | Attending: Plastic Surgery | Admitting: Plastic Surgery

## 2020-01-26 ENCOUNTER — Other Ambulatory Visit (HOSPITAL_COMMUNITY)
Admission: RE | Admit: 2020-01-26 | Discharge: 2020-01-26 | Disposition: A | Discharge status: Home / Self Care | Payer: BC Managed Care – PPO | Source: Ambulatory Visit | Attending: Plastic Surgery | Admitting: Plastic Surgery

## 2020-01-26 ENCOUNTER — Other Ambulatory Visit: Payer: Self-pay

## 2020-01-26 DIAGNOSIS — Z20822 Contact with and (suspected) exposure to covid-19: Secondary | ICD-10-CM | POA: Insufficient documentation

## 2020-01-26 DIAGNOSIS — Z01812 Encounter for preprocedural laboratory examination: Secondary | ICD-10-CM | POA: Insufficient documentation

## 2020-01-26 LAB — BASIC METABOLIC PANEL
Anion gap: 12 (ref 5–15)
BUN: 17 mg/dL (ref 6–20)
CO2: 26 mmol/L (ref 22–32)
Calcium: 9.7 mg/dL (ref 8.9–10.3)
Chloride: 102 mmol/L (ref 98–111)
Creatinine, Ser: 1.07 mg/dL (ref 0.61–1.24)
GFR, Estimated: >60 mL/min (ref >60)
Glucose, Bld: 114 mg/dL — ABNORMAL HIGH (ref 70–99)
Potassium: 4.2 mmol/L (ref 3.5–5.1)
Sodium: 140 mmol/L (ref 135–145)

## 2020-01-26 LAB — SARS CORONAVIRUS 2 (TAT 6-24 HRS): SARS Coronavirus 2: NEGATIVE

## 2020-01-28 ENCOUNTER — Encounter (HOSPITAL_BASED_OUTPATIENT_CLINIC_OR_DEPARTMENT_OTHER): Payer: Self-pay | Admitting: Plastic Surgery

## 2020-01-28 ENCOUNTER — Ambulatory Visit (HOSPITAL_BASED_OUTPATIENT_CLINIC_OR_DEPARTMENT_OTHER)
Admission: RE | Admit: 2020-01-28 | Discharge: 2020-01-28 | Disposition: A | Discharge status: Home / Self Care | Payer: BC Managed Care – PPO | Attending: Plastic Surgery | Admitting: Plastic Surgery

## 2020-01-28 ENCOUNTER — Ambulatory Visit (HOSPITAL_BASED_OUTPATIENT_CLINIC_OR_DEPARTMENT_OTHER): Payer: BC Managed Care – PPO | Admitting: Anesthesiology

## 2020-01-28 ENCOUNTER — Encounter (HOSPITAL_BASED_OUTPATIENT_CLINIC_OR_DEPARTMENT_OTHER)
Admission: RE | Disposition: A | Discharge status: Home / Self Care | Payer: Self-pay | Source: Home / Self Care | Attending: Plastic Surgery

## 2020-01-28 ENCOUNTER — Other Ambulatory Visit: Payer: Self-pay

## 2020-01-28 DIAGNOSIS — Z79899 Other long term (current) drug therapy: Secondary | ICD-10-CM | POA: Diagnosis not present

## 2020-01-28 DIAGNOSIS — Z833 Family history of diabetes mellitus: Secondary | ICD-10-CM | POA: Insufficient documentation

## 2020-01-28 DIAGNOSIS — Z794 Long term (current) use of insulin: Secondary | ICD-10-CM | POA: Diagnosis not present

## 2020-01-28 DIAGNOSIS — Z87891 Personal history of nicotine dependence: Secondary | ICD-10-CM | POA: Insufficient documentation

## 2020-01-28 DIAGNOSIS — Z888 Allergy status to other drugs, medicaments and biological substances status: Secondary | ICD-10-CM | POA: Insufficient documentation

## 2020-01-28 DIAGNOSIS — Z6841 Body Mass Index (BMI) 40.0 and over, adult: Secondary | ICD-10-CM | POA: Diagnosis not present

## 2020-01-28 DIAGNOSIS — I10 Essential (primary) hypertension: Secondary | ICD-10-CM | POA: Insufficient documentation

## 2020-01-28 DIAGNOSIS — K219 Gastro-esophageal reflux disease without esophagitis: Secondary | ICD-10-CM | POA: Insufficient documentation

## 2020-01-28 DIAGNOSIS — Z8042 Family history of malignant neoplasm of prostate: Secondary | ICD-10-CM | POA: Insufficient documentation

## 2020-01-28 DIAGNOSIS — R220 Localized swelling, mass and lump, head: Secondary | ICD-10-CM | POA: Diagnosis present

## 2020-01-28 DIAGNOSIS — D170 Benign lipomatous neoplasm of skin and subcutaneous tissue of head, face and neck: Secondary | ICD-10-CM

## 2020-01-28 DIAGNOSIS — Z823 Family history of stroke: Secondary | ICD-10-CM | POA: Diagnosis not present

## 2020-01-28 DIAGNOSIS — E118 Type 2 diabetes mellitus with unspecified complications: Secondary | ICD-10-CM | POA: Diagnosis not present

## 2020-01-28 DIAGNOSIS — Z8249 Family history of ischemic heart disease and other diseases of the circulatory system: Secondary | ICD-10-CM | POA: Diagnosis not present

## 2020-01-28 HISTORY — PX: LIPOMA EXCISION: SHX5283

## 2020-01-28 HISTORY — DX: Gout, unspecified: M10.9

## 2020-01-28 LAB — GLUCOSE, CAPILLARY
Glucose-Capillary: 95 mg/dL (ref 70–99)
Glucose-Capillary: 128 mg/dL — ABNORMAL HIGH (ref 70–99)

## 2020-01-28 SURGERY — EXCISION LIPOMA
Anesthesia: General | Site: Scalp

## 2020-01-28 MED ORDER — SUGAMMADEX SODIUM 500 MG/5ML IV SOLN
INTRAVENOUS | Status: AC
  Filled 2020-01-28: qty 5

## 2020-01-28 MED ORDER — LACTATED RINGERS IV SOLN: INTRAVENOUS | Status: DC

## 2020-01-28 MED ORDER — ROCURONIUM BROMIDE 100 MG/10ML IV SOLN
INTRAVENOUS | Status: DC | PRN
  Administered 2020-01-28: 70 mg via INTRAVENOUS

## 2020-01-28 MED ORDER — ARTIFICIAL TEARS OPHTHALMIC OINT
TOPICAL_OINTMENT | OPHTHALMIC | Status: DC | PRN
  Administered 2020-01-28: 1 via OPHTHALMIC

## 2020-01-28 MED ORDER — LIDOCAINE 2% (20 MG/ML) 5 ML SYRINGE
INTRAMUSCULAR | Status: AC
  Filled 2020-01-28: qty 5

## 2020-01-28 MED ORDER — SUGAMMADEX SODIUM 200 MG/2ML IV SOLN
INTRAVENOUS | Status: DC | PRN
  Administered 2020-01-28: 300 mg via INTRAVENOUS

## 2020-01-28 MED ORDER — MIDAZOLAM HCL 2 MG/2ML IJ SOLN
INTRAMUSCULAR | Status: DC | PRN
  Administered 2020-01-28: 2 mg via INTRAVENOUS

## 2020-01-28 MED ORDER — ARTIFICIAL TEARS OPHTHALMIC OINT
TOPICAL_OINTMENT | OPHTHALMIC | Status: AC
  Filled 2020-01-28: qty 3.5

## 2020-01-28 MED ORDER — CEFAZOLIN SODIUM-DEXTROSE 2-4 GM/100ML-% IV SOLN: 2.0000 g | INTRAVENOUS | Status: DC

## 2020-01-28 MED ORDER — EPHEDRINE SULFATE 50 MG/ML IJ SOLN
INTRAMUSCULAR | Status: DC | PRN
  Administered 2020-01-28: 10 mg via INTRAVENOUS

## 2020-01-28 MED ORDER — EPHEDRINE 5 MG/ML INJ
INTRAVENOUS | Status: AC
  Filled 2020-01-28: qty 10

## 2020-01-28 MED ORDER — LIDOCAINE HCL (CARDIAC) PF 100 MG/5ML IV SOSY
PREFILLED_SYRINGE | INTRAVENOUS | Status: DC | PRN
  Administered 2020-01-28: 100 mg via INTRATRACHEAL

## 2020-01-28 MED ORDER — CEFAZOLIN SODIUM-DEXTROSE 1-4 GM/50ML-% IV SOLN
INTRAVENOUS | Status: AC
  Filled 2020-01-28: qty 50

## 2020-01-28 MED ORDER — ACETAMINOPHEN 500 MG PO TABS
ORAL_TABLET | ORAL | Status: AC
  Filled 2020-01-28: qty 1

## 2020-01-28 MED ORDER — DEXTROSE 5 % IV SOLN
3.0000 g | Freq: Once | INTRAVENOUS | Status: AC
  Administered 2020-01-28: 3 g via INTRAVENOUS
  Filled 2020-01-28: qty 3000

## 2020-01-28 MED ORDER — GLYCOPYRROLATE PF 0.2 MG/ML IJ SOSY
PREFILLED_SYRINGE | INTRAMUSCULAR | Status: AC
  Filled 2020-01-28: qty 1

## 2020-01-28 MED ORDER — ONDANSETRON HCL 4 MG/2ML IJ SOLN
INTRAMUSCULAR | Status: AC
  Filled 2020-01-28: qty 2

## 2020-01-28 MED ORDER — PHENYLEPHRINE 40 MCG/ML (10ML) SYRINGE FOR IV PUSH (FOR BLOOD PRESSURE SUPPORT)
PREFILLED_SYRINGE | INTRAVENOUS | Status: AC
  Filled 2020-01-28: qty 10

## 2020-01-28 MED ORDER — GLYCOPYRROLATE 0.2 MG/ML IJ SOLN
INTRAMUSCULAR | Status: DC | PRN
  Administered 2020-01-28: .2 mg via INTRAVENOUS

## 2020-01-28 MED ORDER — FENTANYL CITRATE (PF) 100 MCG/2ML IJ SOLN: 25.0000 ug | INTRAMUSCULAR | Status: DC | PRN

## 2020-01-28 MED ORDER — EPINEPHRINE PF 1 MG/ML IJ SOLN
INTRAMUSCULAR | Status: AC
  Filled 2020-01-28: qty 1

## 2020-01-28 MED ORDER — LIDOCAINE-EPINEPHRINE 1 %-1:100000 IJ SOLN
INTRAMUSCULAR | Status: DC | PRN
  Administered 2020-01-28: 40 mL

## 2020-01-28 MED ORDER — DEXAMETHASONE SODIUM PHOSPHATE 10 MG/ML IJ SOLN
INTRAMUSCULAR | Status: DC | PRN
  Administered 2020-01-28: 5 mg via INTRAVENOUS

## 2020-01-28 MED ORDER — ACETAMINOPHEN 500 MG PO TABS
1000.0000 mg | ORAL_TABLET | Freq: Once | ORAL | Status: AC
  Administered 2020-01-28: 1000 mg via ORAL

## 2020-01-28 MED ORDER — PROPOFOL 10 MG/ML IV BOLUS
INTRAVENOUS | Status: AC
  Filled 2020-01-28: qty 20

## 2020-01-28 MED ORDER — PROMETHAZINE HCL 25 MG/ML IJ SOLN: 6.2500 mg | INTRAMUSCULAR | Status: DC | PRN

## 2020-01-28 MED ORDER — BUPIVACAINE HCL (PF) 0.25 % IJ SOLN
INTRAMUSCULAR | Status: AC
  Filled 2020-01-28: qty 30

## 2020-01-28 MED ORDER — CEFAZOLIN SODIUM-DEXTROSE 2-4 GM/100ML-% IV SOLN
INTRAVENOUS | Status: AC
  Filled 2020-01-28: qty 100

## 2020-01-28 MED ORDER — LIDOCAINE-EPINEPHRINE 1 %-1:100000 IJ SOLN
INTRAMUSCULAR | Status: AC
  Filled 2020-01-28: qty 2

## 2020-01-28 MED ORDER — BACITRACIN ZINC 500 UNIT/GM EX OINT
TOPICAL_OINTMENT | CUTANEOUS | Status: AC
  Filled 2020-01-28: qty 28.35

## 2020-01-28 MED ORDER — PHENYLEPHRINE HCL (PRESSORS) 10 MG/ML IV SOLN
INTRAVENOUS | Status: DC | PRN
  Administered 2020-01-28 (×2): 120 ug via INTRAVENOUS

## 2020-01-28 MED ORDER — FENTANYL CITRATE (PF) 100 MCG/2ML IJ SOLN
INTRAMUSCULAR | Status: DC | PRN
  Administered 2020-01-28: 100 ug via INTRAVENOUS

## 2020-01-28 MED ORDER — PROPOFOL 10 MG/ML IV BOLUS
INTRAVENOUS | Status: DC | PRN
  Administered 2020-01-28: 200 mg via INTRAVENOUS

## 2020-01-28 MED ORDER — ONDANSETRON HCL 4 MG/2ML IJ SOLN
INTRAMUSCULAR | Status: DC | PRN
  Administered 2020-01-28: 4 mg via INTRAVENOUS

## 2020-01-28 MED ORDER — FENTANYL CITRATE (PF) 100 MCG/2ML IJ SOLN
INTRAMUSCULAR | Status: AC
  Filled 2020-01-28: qty 2

## 2020-01-28 MED ORDER — MIDAZOLAM HCL 2 MG/2ML IJ SOLN
INTRAMUSCULAR | Status: AC
  Filled 2020-01-28: qty 2

## 2020-01-28 MED ORDER — GLYCOPYRROLATE 0.2 MG/ML IJ SOLN: INTRAMUSCULAR | Status: DC | PRN

## 2020-01-28 MED ORDER — ROCURONIUM BROMIDE 10 MG/ML (PF) SYRINGE
PREFILLED_SYRINGE | INTRAVENOUS | Status: AC
  Filled 2020-01-28: qty 10

## 2020-01-28 SURGICAL SUPPLY — 71 items
ADH SKN CLS APL DERMABOND .7 (GAUZE/BANDAGES/DRESSINGS)
APL PRP STRL LF DISP 70% ISPRP (MISCELLANEOUS)
APL SKNCLS STERI-STRIP NONHPOA (GAUZE/BANDAGES/DRESSINGS)
BAND INSRT 18 STRL LF DISP RB (MISCELLANEOUS)
BAND RUBBER #18 3X1/16 STRL (MISCELLANEOUS) IMPLANT
BENZOIN TINCTURE PRP APPL 2/3 (GAUZE/BANDAGES/DRESSINGS) IMPLANT
BLADE CLIPPER SURG (BLADE) IMPLANT
BLADE SURG 15 STRL LF DISP TIS (BLADE) ×1 IMPLANT
BLADE SURG 15 STRL SS (BLADE) ×3
CANISTER SUCT 1200ML W/VALVE (MISCELLANEOUS) IMPLANT
CHLORAPREP W/TINT 26 (MISCELLANEOUS) IMPLANT
CLOSURE WOUND 1/2 X4 (GAUZE/BANDAGES/DRESSINGS)
COVER BACK TABLE 60X90IN (DRAPES) ×3 IMPLANT
COVER MAYO STAND STRL (DRAPES) ×3 IMPLANT
COVER WAND RF STERILE (DRAPES) IMPLANT
DERMABOND ADVANCED (GAUZE/BANDAGES/DRESSINGS)
DERMABOND ADVANCED .7 DNX12 (GAUZE/BANDAGES/DRESSINGS) IMPLANT
DRAIN JP 10F RND SILICONE (MISCELLANEOUS) IMPLANT
DRAPE LAPAROTOMY 100X72 PEDS (DRAPES) IMPLANT
DRAPE U-SHAPE 76X120 STRL (DRAPES) ×3 IMPLANT
DRAPE UTILITY XL STRL (DRAPES) ×3 IMPLANT
DRSG PAD ABDOMINAL 8X10 ST (GAUZE/BANDAGES/DRESSINGS) ×2 IMPLANT
DRSG TELFA 3X8 NADH (GAUZE/BANDAGES/DRESSINGS) IMPLANT
ELECT COATED BLADE 2.86 ST (ELECTRODE) ×3 IMPLANT
ELECT NEEDLE BLADE 2-5/6 (NEEDLE) IMPLANT
ELECT REM PT RETURN 9FT ADLT (ELECTROSURGICAL) ×3
ELECT REM PT RETURN 9FT PED (ELECTROSURGICAL)
ELECTRODE REM PT RETRN 9FT PED (ELECTROSURGICAL) IMPLANT
ELECTRODE REM PT RTRN 9FT ADLT (ELECTROSURGICAL) ×1 IMPLANT
EVACUATOR SILICONE 100CC (DRAIN) IMPLANT
GAUZE SPONGE 4X4 12PLY STRL LF (GAUZE/BANDAGES/DRESSINGS) IMPLANT
GAUZE XEROFORM 1X8 LF (GAUZE/BANDAGES/DRESSINGS) IMPLANT
GLOVE BIOGEL M STRL SZ7.5 (GLOVE) ×3 IMPLANT
GLOVE BIOGEL PI IND STRL 8 (GLOVE) ×1 IMPLANT
GLOVE BIOGEL PI INDICATOR 8 (GLOVE) ×2
GOWN STRL REUS W/ TWL LRG LVL3 (GOWN DISPOSABLE) ×3 IMPLANT
GOWN STRL REUS W/TWL LRG LVL3 (GOWN DISPOSABLE) ×9
MARKER SKIN DUAL TIP RULER LAB (MISCELLANEOUS) ×2 IMPLANT
NEEDLE HYPO 30GX1 BEV (NEEDLE) IMPLANT
NEEDLE PRECISIONGLIDE 27X1.5 (NEEDLE) ×3 IMPLANT
NEEDLE SPNL 18GX3.5 QUINCKE PK (NEEDLE) IMPLANT
NS IRRIG 1000ML POUR BTL (IV SOLUTION) ×3 IMPLANT
PACK BASIN DAY SURGERY FS (CUSTOM PROCEDURE TRAY) ×3 IMPLANT
PENCIL SMOKE EVACUATOR (MISCELLANEOUS) ×3 IMPLANT
SHEET MEDIUM DRAPE 40X70 STRL (DRAPES) IMPLANT
SLEEVE SCD COMPRESS KNEE MED (MISCELLANEOUS) ×3 IMPLANT
SPONGE GAUZE 2X2 8PLY STER LF (GAUZE/BANDAGES/DRESSINGS)
SPONGE GAUZE 2X2 8PLY STRL LF (GAUZE/BANDAGES/DRESSINGS) IMPLANT
SPONGE LAP 18X18 RF (DISPOSABLE) ×3 IMPLANT
STAPLER VISISTAT 35W (STAPLE) ×1 IMPLANT
STRIP CLOSURE SKIN 1/2X4 (GAUZE/BANDAGES/DRESSINGS) IMPLANT
SUCTION FRAZIER HANDLE 10FR (MISCELLANEOUS)
SUCTION TUBE FRAZIER 10FR DISP (MISCELLANEOUS) IMPLANT
SUT ETHILON 4 0 PS 2 18 (SUTURE) IMPLANT
SUT MNCRL AB 3-0 PS2 18 (SUTURE) ×3 IMPLANT
SUT MNCRL AB 4-0 PS2 18 (SUTURE) IMPLANT
SUT MON AB 5-0 P3 18 (SUTURE) IMPLANT
SUT PDS 3-0 CT2 (SUTURE) ×3
SUT PDS II 3-0 CT2 27 ABS (SUTURE) ×1 IMPLANT
SUT PLAIN 5 0 P 3 18 (SUTURE) IMPLANT
SUT PROLENE 5 0 P 3 (SUTURE) IMPLANT
SUT VICRYL 4-0 PS2 18IN ABS (SUTURE) IMPLANT
SWAB COLLECTION DEVICE MRSA (MISCELLANEOUS) IMPLANT
SWAB CULTURE ESWAB REG 1ML (MISCELLANEOUS) IMPLANT
SYR BULB EAR ULCER 3OZ GRN STR (SYRINGE) IMPLANT
SYR CONTROL 10ML LL (SYRINGE) ×3 IMPLANT
TOWEL GREEN STERILE FF (TOWEL DISPOSABLE) ×3 IMPLANT
TRAY DSU PREP LF (CUSTOM PROCEDURE TRAY) ×2 IMPLANT
TUBE CONNECTING 20'X1/4 (TUBING)
TUBE CONNECTING 20X1/4 (TUBING) IMPLANT
TUBING INFILTRATION IT-10001 (TUBING) IMPLANT

## 2020-01-28 NOTE — Discharge Instructions (Signed)
Activity: As tolerated, but avoid strenuous activity until follow up visit.  Diet: Regular  Wound Care: Keep dressing clean & dry for 2 days.  After that you can shower normally.  Redress the wound as needed for comfort.  Special Instructions: None  Call our office if any unusual problems occur such as pain, excessive bleeding, unrelieved nausea/vomiting, fever &/or chills.  Follow-up appointment: Scheduled for next week.  No Tylenol until 2:20 pm  Post Anesthesia Home Care Instructions  Activity: Get plenty of rest for the remainder of the day. A responsible individual must stay with you for 24 hours following the procedure.  For the next 24 hours, DO NOT: -Drive a car -Paediatric nurse -Drink alcoholic beverages -Take any medication unless instructed by your physician -Make any legal decisions or sign important papers.  Meals: Start with liquid foods such as gelatin or soup. Progress to regular foods as tolerated. Avoid greasy, spicy, heavy foods. If nausea and/or vomiting occur, drink only clear liquids until the nausea and/or vomiting subsides. Call your physician if vomiting continues.  Special Instructions/Symptoms: Your throat may feel dry or sore from the anesthesia or the breathing tube placed in your throat during surgery. If this causes discomfort, gargle with warm salt water. The discomfort should disappear within 24 hours.  If you had a scopolamine patch placed behind your ear for the management of post- operative nausea and/or vomiting:  1. The medication in the patch is effective for 72 hours, after which it should be removed.  Wrap patch in a tissue and discard in the trash. Wash hands thoroughly with soap and water. 2. You may remove the patch earlier than 72 hours if you experience unpleasant side effects which may include dry mouth, dizziness or visual disturbances. 3. Avoid touching the patch. Wash your hands with soap and water after contact with the patch.

## 2020-01-28 NOTE — Op Note (Signed)
Operative Note   DATE OF OPERATION: 01/28/2020  SURGICAL DEPARTMENT: Plastic Surgery  PREOPERATIVE DIAGNOSES: Scalp soft tissue mass  POSTOPERATIVE DIAGNOSES:  same  PROCEDURE: Excision of subfascial scalp soft tissue mass 12 cm with 12 cm complex closure  SURGEON: Talmadge Coventry, MD  ASSISTANT: [place], RNFA The advanced practice practitioner (APP) assisted throughout the case.  The APP was essential in retraction and counter traction when needed to make the case progress smoothly.  This retraction and assistance made it possible to see the tissue plans for the procedure.  The assistance was needed for blood control, tissue re-approximation and assisted with closure of the incision site.  ANESTHESIA:  General.   COMPLICATIONS: None.   INDICATIONS FOR PROCEDURE:  The patient, Justin Houston is a 54 y.o. male born on Sep 11, 1965, is here for treatment of symptomatic soft tissue mass of the right scalp MRN: [id number]  CONSENT:  Informed consent was obtained directly from the patient. Risks, benefits and alternatives were fully discussed. Specific risks including but not limited to bleeding, infection, hematoma, seroma, scarring, pain, contracture, asymmetry, wound healing problems, and need for further surgery were all discussed. The patient did have an ample opportunity to have questions answered to satisfaction.   DESCRIPTION OF PROCEDURE:  The patient was taken to the operating room. SCDs were placed and Ancef antibiotics were given.  General anesthesia was administered.  The patient's operative site was prepped and draped in a sterile fashion. A time out was performed and all information was confirmed to be correct.  Local anesthesia was injected circumferentially.  Elliptical incision was then made over the mass.  I dissected down to the mass with cautery and it appeared to be a benign lipoma.  Circumferential dissection was done with a combination of tenotomy scissors and  cautery.  This went down to but did not violate the pericranium.  The mass was excised and was about 12 cm in length.  Meticulous hemostasis was then performed.  The surrounding skin was then undermined and advanced and closed with interrupted buried 3-0 PDS sutures and a running 3-0 Monocryl.  The skin on a nice on table result.  Soft dressing including a head wrap were then applied  The patient tolerated the procedure well.  There were no complications. The patient was allowed to wake from anesthesia, extubated and taken to the recovery room in satisfactory condition.

## 2020-01-28 NOTE — Brief Op Note (Signed)
01/28/2020  10:28 AM  PATIENT:  Justin Houston  54 y.o. male  PRE-OPERATIVE DIAGNOSIS:  Scalp lipoma  POST-OPERATIVE DIAGNOSIS:  Scalp lipoma  PROCEDURE:  Procedure(s): EXCISION RIGHT SCALP LIPOMA (N/A)  SURGEON:  Surgeon(s) and Role:    * Ryelle Ruvalcaba, Steffanie Dunn, MD - Primary  PHYSICIAN ASSISTANT: [place], RNFA  ASSISTANTS: none   ANESTHESIA:   general  EBL:  25 mL   BLOOD ADMINISTERED:none  DRAINS: none   LOCAL MEDICATIONS USED:  XYLOCAINE   SPECIMEN:  Source of Specimen:  scalp lipoma   DISPOSITION OF SPECIMEN:  PATHOLOGY  COUNTS:  YES  TOURNIQUET:  * No tourniquets in log *  DICTATION: .Dragon Dictation  PLAN OF CARE: Discharge to home after PACU  PATIENT DISPOSITION:  PACU - hemodynamically stable.   Delay start of Pharmacological VTE agent (>24hrs) due to surgical blood loss or risk of bleeding: not applicable

## 2020-01-28 NOTE — Anesthesia Preprocedure Evaluation (Addendum)
Anesthesia Evaluation  Patient identified by MRN, date of birth, ID band Patient awake    Reviewed: Allergy & Precautions, NPO status , Patient's Chart, lab work & pertinent test results, reviewed documented beta blocker date and time   Airway Mallampati: II  TM Distance: >3 FB Neck ROM: Full    Dental  (+) Teeth Intact   Pulmonary former smoker,    Pulmonary exam normal breath sounds clear to auscultation       Cardiovascular hypertension, Pt. on medications and Pt. on home beta blockers Normal cardiovascular exam Rhythm:Regular Rate:Normal     Neuro/Psych negative neurological ROS     GI/Hepatic Neg liver ROS, GERD  Medicated,  Endo/Other  diabetes, Type 2, Oral Hypoglycemic Agents, Insulin DependentMorbid obesity  Renal/GU negative Renal ROS     Musculoskeletal negative musculoskeletal ROS (+)   Abdominal   Peds  Hematology negative hematology ROS (+)   Anesthesia Other Findings Day of surgery medications reviewed with the patient.  scalp lipoma  Reproductive/Obstetrics                            Anesthesia Physical Anesthesia Plan  ASA: III  Anesthesia Plan: General   Post-op Pain Management:    Induction: Intravenous  PONV Risk Score and Plan: 2 and Midazolam, Dexamethasone and Ondansetron  Airway Management Planned: LMA  Additional Equipment:   Intra-op Plan:   Post-operative Plan: Extubation in OR  Informed Consent: I have reviewed the patients History and Physical, chart, labs and discussed the procedure including the risks, benefits and alternatives for the proposed anesthesia with the patient or authorized representative who has indicated his/her understanding and acceptance.       Plan Discussed with: CRNA  Anesthesia Plan Comments:        Anesthesia Quick Evaluation

## 2020-01-28 NOTE — Interval H&P Note (Signed)
History and Physical Interval Note:  01/28/2020 9:21 AM  Justin Houston  has presented today for surgery, with the diagnosis of Scalp lipoma.  The various methods of treatment have been discussed with the patient and family. After consideration of risks, benefits and other options for treatment, the patient has consented to  Procedure(s) with comments: EXCISION LIPOMA (N/A) - 45 min, please as a surgical intervention.  The patient's history has been reviewed, patient examined, no change in status, stable for surgery.  I have reviewed the patient's chart and labs.  Questions were answered to the patient's satisfaction.     Cindra Presume

## 2020-01-28 NOTE — Transfer of Care (Signed)
Immediate Anesthesia Transfer of Care Note  Patient: Justin Houston  Procedure(s) Performed: EXCISION RIGHT SCALP LIPOMA (N/A Scalp)  Patient Location: PACU  Anesthesia Type:General  Level of Consciousness: awake, alert  and patient cooperative  Airway & Oxygen Therapy: Patient Spontanous Breathing and Patient connected to face mask oxygen  Post-op Assessment: Report given to RN, Post -op Vital signs reviewed and stable, Patient moving all extremities X 4 and Patient able to stick tongue midline  Post vital signs: Reviewed and stable  Last Vitals:  Vitals Value Taken Time  BP 149/69 01/28/20 1024  Temp 36.5 C 01/28/20 1025  Pulse 73 01/28/20 1025  Resp 22 01/28/20 1026  SpO2 100 % 01/28/20 1025  Vitals shown include unvalidated device data.  Last Pain:  Vitals:   01/28/20 0808  TempSrc: Oral  PainSc: 0-No pain      Patients Stated Pain Goal: 1 (70/48/88 9169)  Complications: No complications documented.

## 2020-01-28 NOTE — Anesthesia Procedure Notes (Signed)
Procedure Name: Intubation Date/Time: 01/28/2020 9:31 AM Performed by: Collier Bullock, CRNA Pre-anesthesia Checklist: Patient identified, Emergency Drugs available, Suction available and Patient being monitored Patient Re-evaluated:Patient Re-evaluated prior to induction Oxygen Delivery Method: Circle system utilized Preoxygenation: Pre-oxygenation with 100% oxygen Induction Type: IV induction Ventilation: Oral airway inserted - appropriate to patient size Laryngoscope Size: Mac and 4 Grade View: Grade I Tube type: Oral Tube size: 7.0 mm Number of attempts: 1 Airway Equipment and Method: Stylet Placement Confirmation: ETT inserted through vocal cords under direct vision,  positive ETCO2 and breath sounds checked- equal and bilateral Secured at: 23 cm Tube secured with: Tape Dental Injury: Teeth and Oropharynx as per pre-operative assessment

## 2020-01-30 NOTE — Anesthesia Postprocedure Evaluation (Signed)
Anesthesia Post Note  Patient: Justin Houston  Procedure(s) Performed: EXCISION RIGHT SCALP LIPOMA (N/A Scalp)     Patient location during evaluation: PACU Anesthesia Type: General Level of consciousness: awake and alert Pain management: pain level controlled Vital Signs Assessment: post-procedure vital signs reviewed and stable Respiratory status: spontaneous breathing, nonlabored ventilation and respiratory function stable Cardiovascular status: blood pressure returned to baseline and stable Postop Assessment: no apparent nausea or vomiting Anesthetic complications: no   No complications documented.  Last Vitals:  Vitals:   01/28/20 1045 01/28/20 1113  BP: 131/69   Pulse: 69 63  Resp: 20   Temp:  36.5 C  SpO2: 96% 95%    Last Pain:  Vitals:   01/28/20 1113  TempSrc:   PainSc: 0-No pain                 Catalina Gravel

## 2020-01-31 ENCOUNTER — Encounter (HOSPITAL_BASED_OUTPATIENT_CLINIC_OR_DEPARTMENT_OTHER): Payer: Self-pay | Admitting: Plastic Surgery

## 2020-02-01 LAB — SURGICAL PATHOLOGY

## 2020-02-07 ENCOUNTER — Encounter: Payer: Self-pay | Admitting: Plastic Surgery

## 2020-02-07 ENCOUNTER — Ambulatory Visit (INDEPENDENT_AMBULATORY_CARE_PROVIDER_SITE_OTHER): Payer: BC Managed Care – PPO | Admitting: Plastic Surgery

## 2020-02-07 ENCOUNTER — Other Ambulatory Visit: Payer: Self-pay

## 2020-02-07 VITALS — BP 136/85 | HR 70 | Temp 97.6°F

## 2020-02-07 DIAGNOSIS — D170 Benign lipomatous neoplasm of skin and subcutaneous tissue of head, face and neck: Secondary | ICD-10-CM

## 2020-02-07 NOTE — Progress Notes (Signed)
Patient is here postop from excision of a lipoma from his right scalp.  He feels good with no complaints.  On exam everything is healing nicely.  His pathology was reviewed was consistent with a benign lipoma.  Given him instructions on how to manage things going forward and all history sutures to dissolve.  If he has any further questions or concerns he can call us but he should not need another appointment with [place] and will follow him as needed.  All of his questions were answered today.

## 2020-02-11 ENCOUNTER — Ambulatory Visit: Payer: BC Managed Care – PPO | Admitting: Podiatry

## 2020-02-26 ENCOUNTER — Other Ambulatory Visit: Payer: Self-pay | Admitting: Critical Care Medicine

## 2020-02-26 NOTE — Telephone Encounter (Signed)
Requested Prescriptions  Pending Prescriptions Disp Refills  . insulin detemir (LEVEMIR) 100 UNIT/ML injection [Pharmacy Med Name: Levemir U-100 Insulin 100 unit/mL subcutaneous solution] 20 mL 2    Sig: INJECT 40 UNITS SUBCUTANEOUSLY 2 TIMES DAILY     Endocrinology:  Diabetes - Insulins Passed - 02/26/2020  3:57 PM      Passed - HBA1C is between 0 and 7.9 and within 180 days    Hemoglobin A1C  Date Value Ref Range Status  10/05/2019 6.1 (A) 4.0 - 5.6 % Final   HbA1c, POC (controlled diabetic range)  Date Value Ref Range Status  08/31/2018 9.6 (A) 0.0 - 7.0 % Final         Passed - Valid encounter within last 6 months    Recent Outpatient Visits          2 months ago Pain and swelling of left ankle   [place], [place], MD   3 months ago Diabetes mellitus type 2 in obese [facility])   [place] [name], MD   4 months ago Umbilical hernia, incarcerated   [place], Jeneen Rinks, MD   4 months ago Type 2 diabetes mellitus with retinopathy, with long-term current use of insulin, macular edema presence unspecified, unspecified laterality, unspecified retinopathy severity [facility])   [place] [name], MD   11 months ago Diabetes mellitus type 2 in obese [facility])   [place] [name], MD      Future Appointments            In 2 weeks [name], MD [place]

## 2020-03-14 ENCOUNTER — Ambulatory Visit: Payer: BC Managed Care – PPO | Attending: Critical Care Medicine | Admitting: Critical Care Medicine

## 2020-03-14 ENCOUNTER — Other Ambulatory Visit: Payer: Self-pay

## 2020-03-14 ENCOUNTER — Encounter: Payer: Self-pay | Admitting: Critical Care Medicine

## 2020-03-14 VITALS — BP 183/92 | HR 79 | Temp 98.2°F | Ht 72.0 in | Wt 331.4 lb

## 2020-03-14 DIAGNOSIS — I10 Essential (primary) hypertension: Secondary | ICD-10-CM | POA: Diagnosis not present

## 2020-03-14 DIAGNOSIS — L309 Dermatitis, unspecified: Secondary | ICD-10-CM

## 2020-03-14 DIAGNOSIS — Z794 Long term (current) use of insulin: Secondary | ICD-10-CM

## 2020-03-14 DIAGNOSIS — E785 Hyperlipidemia, unspecified: Secondary | ICD-10-CM | POA: Diagnosis not present

## 2020-03-14 DIAGNOSIS — E11319 Type 2 diabetes mellitus with unspecified diabetic retinopathy without macular edema: Secondary | ICD-10-CM | POA: Diagnosis not present

## 2020-03-14 DIAGNOSIS — E1169 Type 2 diabetes mellitus with other specified complication: Secondary | ICD-10-CM

## 2020-03-14 DIAGNOSIS — E669 Obesity, unspecified: Secondary | ICD-10-CM

## 2020-03-14 DIAGNOSIS — D1779 Benign lipomatous neoplasm of other sites: Secondary | ICD-10-CM

## 2020-03-14 LAB — POCT GLYCOSYLATED HEMOGLOBIN (HGB A1C): Hemoglobin A1C: 6.8 % — AB (ref 4.0–5.6)

## 2020-03-14 MED ORDER — HYDRALAZINE HCL 50 MG PO TABS: 50.0000 mg | ORAL_TABLET | Freq: Three times a day (TID) | ORAL | 3 refills | Status: DC

## 2020-03-14 MED ORDER — METFORMIN HCL 1000 MG PO TABS: 1000.0000 mg | ORAL_TABLET | Freq: Two times a day (BID) | ORAL | 1 refills | Status: DC

## 2020-03-14 MED ORDER — CHLORTHALIDONE 25 MG PO TABS: 25.0000 mg | ORAL_TABLET | Freq: Every day | ORAL | 2 refills | Status: DC

## 2020-03-14 MED ORDER — ATORVASTATIN CALCIUM 40 MG PO TABS: 40.0000 mg | ORAL_TABLET | Freq: Every day | ORAL | 3 refills | Status: DC

## 2020-03-14 MED ORDER — TRULICITY 0.75 MG/0.5ML ~~LOC~~ SOAJ: 0.7500 mg | SUBCUTANEOUS | 1 refills | Status: DC

## 2020-03-14 MED ORDER — DAPAGLIFLOZIN PROPANEDIOL 5 MG PO TABS: 5.0000 mg | ORAL_TABLET | Freq: Every day | ORAL | 2 refills | Status: DC

## 2020-03-14 MED ORDER — INSULIN DETEMIR 100 UNIT/ML ~~LOC~~ SOLN
SUBCUTANEOUS | 2 refills | Status: DC
Start: 2020-03-14 — End: 2020-05-29

## 2020-03-14 MED ORDER — ATENOLOL 100 MG PO TABS: 100.0000 mg | ORAL_TABLET | Freq: Every day | ORAL | 2 refills | Status: DC

## 2020-03-14 MED ORDER — CLOBETASOL PROPIONATE 0.05 % EX OINT: 1.0000 "application " | TOPICAL_OINTMENT | Freq: Three times a day (TID) | CUTANEOUS | 1 refills | Status: DC | PRN

## 2020-03-14 MED ORDER — AMLODIPINE BESYLATE 10 MG PO TABS: 10.0000 mg | ORAL_TABLET | Freq: Every day | ORAL | 2 refills | Status: DC

## 2020-03-14 NOTE — Assessment & Plan Note (Signed)
Continue atorvastatin

## 2020-03-14 NOTE — Assessment & Plan Note (Signed)
Hypertension not well controlled due to the fact the patient is nonadherent with his medications I advised him he must take all medications as prescribed and have refilled all of his blood pressure medicines at this time

## 2020-03-14 NOTE — Patient Instructions (Signed)
[facility]  Increase Tenormin which is atenolol to 100 mg daily can take 2 of the 50 mg daily to your bottle runs out refill be 1 100 mg tablet  Please obtain an eye exam  Return to see Dr. Joya Gaskins again in 3 months

## 2020-03-14 NOTE — Assessment & Plan Note (Signed)
Type 2 diabetes which again due to lack of compliance to insulin A1c is crept up he was a controlled the last visit I have refilled all of his diabetic medications and he is to follow-up again in close time.

## 2020-03-14 NOTE — Progress Notes (Signed)
DRY RASH IN MIDDLE OF BACK  ATENOLOL AND AMLODIPINE NEEDS REFILL

## 2020-03-14 NOTE — Assessment & Plan Note (Signed)
Resolved with resection

## 2020-03-14 NOTE — Progress Notes (Signed)
Subjective:    Patient ID: Justin Houston, male    DOB: [birth year], 54 y.o.   MRN: [id number]  This is a 54 year old male seen in f/u for T2DM, back pain, GERD, varicose veins.   The patient has been seen by clinical pharmacist with medication adjustments.  The pt was referred to podiatry and was seen.   Gen Surgery made but not yet occurred for umbilical hernia.  No dyspnea, pt is moving more.  CBGs much improved 90-100 fasting up to 120   Wt Readings from Last 3 Encounters: 11/02/18 : (!) 318 lb (144.2 kg) 08/31/18 : (!) 316 lb (143.3 kg) 11/28/17 : (!) 326 lb (147.9 kg)   11/23  This patient was last seen in July and at that visit we continued amlodipine 10 mg daily atenolol 50 mg daily chlorthalidone daily and hydralazine 3 times daily the patient's blood pressure remains quite labile since that time.  Also the patient has been seen by vascular surgery for varicose veins and they have prescribed thigh-high support hose.  The patient's been compliant with his diabetic program to include the Levemir Trulicity and Metformin along with [place].  His hemoglobin A1c today was 5.6 which is a significant reduction  We did refer the patient to general surgery to treat his umbilical hernia and lipoma on the head unfortunately he cannot afford the co-pay for the surgical procedure despite his insurance coverage.  We tried to find various methods to obtain support for this patient but have met many barriers.  We are attempting now to see if we can get financial discount for this patient despite his insurance status  Wt Readings from Last 3 Encounters: 03/08/19 : (!) 325 lb (147.4 kg) 12/31/18 : (!) 316 lb 9.6 oz (143.6 kg) 11/25/18 : (!) 319 lb 12.8 oz (145.1 kg)  10/05/2019 Last saw seen in November.  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  He also has a chronic umbilical hernia that is easily reducible.   This hernia is been present for some time.  He was hoping to achieve a combined surgical approach to remove the lipoma and repair the hernia which would require mesh placement.  The mass on the head is postauricular and getting slightly larger in size.  The patient's not able to afford his co-pay but he does have [employer].  Patient maintains aggressive glucose control using Trulicity weekly, Farxiga, Levemir insulin.  Note on arrival hemoglobin A1c is 6.1 blood glucose is 89  11/09/2019 This patient is seen in return follow-up for a lipoma of the skull and hypertension diabetes  Patient's glycemic control has been reasonable at home is 100 in the morning 180 postprandial  The patient has an appointment with plastic surgery to evaluate the removal of the lipoma on the head and he is in a take this on first and hold off on the umbilical hernia for now Patient has no new complaints he did receive his Covid vaccine there are no primary care gaps currently seen other than the fact he needs a repeat follow-up blood draw for his renal function  03/14/2020 This patient is seen return follow-up history of hypertension type 2 diabetes with retinopathy hyperlipidemia.  He did have his lipoma from his head removed recently and is done well with this.  Umbilical hernia is stable at this time.  He has no other new  real complaints except for a rash on the back of his chest area  Patient maintains the Metformin and [place] but does misses doses on occasionally also misses his Levemir dose in the evening and misses some blood pressure medicine doses including the hydralazine.  On arrival blood pressure is 183/92 and his hemoglobin A1c is 6.8   Past Medical History:  Diagnosis Date  . Dermatitis   . Diabetes mellitus   . GERD (gastroesophageal reflux disease)   . Gout   . Hyperlipidemia   . Hypertension   . Lipoma of scalp   . Mass of head, right postauricular SQ 7x4cm  09/29/2013  . Obesity   . Toenail fungus 08/31/2018     Family History  Problem Relation Age of Onset  . Stroke Mother   . Hypertension Mother   . Diabetes Maternal Grandmother   . Prostate cancer Maternal Grandfather   . Diabetes Other      Social History   Socioeconomic History  . Marital status: Single    Spouse name: n/a  . Number of children: 0  . Years of education: 13  . Highest education level: Not on file  Occupational History  . Occupation: Software engineer: [employer]  Tobacco Use  . Smoking status: Former Smoker    Quit date: 09/05/1994    Years since quitting: 25.5  . Smokeless tobacco: Never Used  Vaping Use  . Vaping Use: Never used  Substance and Sexual Activity  . Alcohol use: No    Alcohol/week: 0.0 standard drinks  . Drug use: No  . Sexual activity: Never  Other Topics Concern  . Not on file  Social History Narrative   Master's Degree in Adult Education.  Lives alone.   No siblings.   Social Determinants of Health   Financial Resource Strain:   . Difficulty of Paying Living Expenses: Not on file  Food Insecurity:   . Worried About Charity fundraiser in the Last Year: Not on file  . Ran Out of Food in the Last Year: Not on file  Transportation Needs:   . Lack of Transportation (Medical): Not on file  . Lack of Transportation (Non-Medical): Not on file  Physical Activity:   . Days of Exercise per Week: Not on file  . Minutes of Exercise per Session: Not on file  Stress:   . Feeling of Stress : Not on file  Social Connections:   . Frequency of Communication with Friends and Family: Not on file  . Frequency of Social Gatherings with Friends and Family: Not on file  . Attends Religious Services: Not on file  . Active Member of Clubs or Organizations: Not on file  . Attends Archivist Meetings: Not on file  . Marital Status: Not on file  Intimate Partner Violence:   . Fear of Current or Ex-Partner: Not  on file  . Emotionally Abused: Not on file  . Physically Abused: Not on file  . Sexually Abused: Not on file     Allergies  Allergen Reactions  . Losartan Potassium-Hctz Hives  . Cozaar [Losartan Potassium] Hives    Hives      Outpatient Medications Prior to Visit  Medication Sig Dispense Refill  . ALPHAGAN P 0.1 % SOLN Place 1 drop into both eyes 2 (two) times daily. 15 mL 1  . Blood Glucose Monitoring Suppl (ONETOUCH VERIO) w/Device KIT Use as instructed to check blood sugar 3 times daily. E11.319  Z79.4 1 kit 0  . colchicine 0.6 MG tablet Take 1 tablet (0.6 mg total) by mouth 2 (two) times daily as needed. 20 tablet 1  . glucose blood (ONETOUCH VERIO) test strip USE AS INSTRUCTED TO CHECK BLOOD SUGAR 3 TIMES DAILY 100 strip 11  . Insulin Pen Needle 30G X 5 MM MISC 1 Stick by Does not apply route daily. 100 each 11  . Insulin Syringe-Needle U-100 (TRUEPLUS INSULIN SYRINGE) 31G X 5/16" 1 ML MISC Use to inject insulin twice daily as directed. 100 each 11  . omeprazole (PRILOSEC) 20 MG capsule Take 1 capsule (20 mg total) by mouth daily. 30 capsule 4  . OneTouch Delica Lancets 56D MISC Use as instructed to check blood sugar 3 times daily. E11.319 Z79.4 100 each 11  . STIMULANT LAXATIVE 8.6-50 MG tablet TAKE 1 TABLET BY MOUTH DAILY 30 tablet 1  . amLODipine (NORVASC) 10 MG tablet TAKE 1 TABLET BY MOUTH EVERY DAY 90 tablet 0  . atenolol (TENORMIN) 50 MG tablet TAKE 1 TABLET BY MOUTH EVERY DAY 90 tablet 0  . atorvastatin (LIPITOR) 40 MG tablet Take 1 tablet (40 mg total) by mouth daily. 30 tablet 3  . chlorthalidone (HYGROTON) 25 MG tablet Take 1 tablet (25 mg total) by mouth daily. 30 tablet 2  . clobetasol ointment (TEMOVATE) 1.49 % Apply 1 application topically 3 (three) times daily as needed (rash.). 60 g 1  . clotrimazole-betamethasone (LOTRISONE) cream Apply 1 application topically 2 (two) times daily. 30 g 3  . Dulaglutide (TRULICITY) 7.02 OV/7.8HY SOPN Inject 0.5 mLs (0.75 mg total)  into the skin once a week. 2 mL 1  . FARXIGA 5 MG TABS tablet TAKE 1 TABLET BY MOUTH EVERY DAY 30 tablet 2  . hydrALAZINE (APRESOLINE) 50 MG tablet Take 1 tablet (50 mg total) by mouth 3 (three) times daily. 90 tablet 3  . insulin detemir (LEVEMIR) 100 UNIT/ML injection INJECT 40 UNITS SUBCUTANEOUSLY 2 TIMES DAILY 20 mL 2  . metFORMIN (GLUCOPHAGE) 1000 MG tablet TAKE 1 TABLET BY MOUTH 2 TIMES DAILY WITH MEALS 60 tablet 1  . furosemide (LASIX) 20 MG tablet Take 1 tablet (20 mg total) by mouth daily. (Patient not taking: Reported on 03/14/2020) 5 tablet 0   No facility-administered medications prior to visit.   Review of Systems  Constitutional: Negative.  Negative for fever.  HENT: Negative.   Eyes: Negative for visual disturbance.  Respiratory: Negative for cough, choking, chest tightness, shortness of breath and wheezing.   Cardiovascular: Negative for chest pain, palpitations and leg swelling.       Varicose veins  Gastrointestinal: Negative for abdominal distention, abdominal pain, anal bleeding, blood in stool, constipation, diarrhea, nausea, rectal pain and vomiting.  Endocrine: Negative.   Genitourinary: Negative.   Musculoskeletal: Negative.   Skin: Negative.  Negative for rash.  Neurological: Negative.   Hematological: Negative.   Psychiatric/Behavioral: Negative.        Objective:   Physical Exam Vitals:   03/14/20 1443  BP: (!) 183/92  Pulse: 79  Temp: 98.2 F (36.8 C)  SpO2: 98%  Weight: (!) 331 lb 6.4 oz (150.3 kg)  Height: 6' (1.829 m)   Wt Readings from Last 3 Encounters:  03/14/20 (!) 331 lb 6.4 oz (150.3 kg)  01/28/20 (!) 330 lb 4 oz (149.8 kg)  12/23/19 (!) 329 lb (149.2 kg)   Gen: Pleasant, obese, in no distress,  normal affect  ENT: No lesions,  mouth clear,  oropharynx clear, no postnasal  drip, lipomatous lesion has resolved off the head with resection  Neck: No JVD, no TMG, no carotid bruits  Lungs: No use of accessory muscles, no dullness to  percussion, clear without rales or rhonchi  Cardiovascular: RRR, heart sounds normal, no murmur or gallops, no peripheral edema, severe varicose veins  Abdomen: soft and NT, no HSM,  BS normal, umbilical hernia with part of bowel in hernia, this is reduceable  Musculoskeletal: No deformities, no cyanosis or clubbing   Neuro: alert, non focal  Skin: Warm, no lesions or rashes  BMP Latest Ref Rng & Units 01/26/2020 11/09/2019 03/08/2019  Glucose 70 - 99 mg/dL 114(H) 152(H) 126(H)  BUN 6 - 20 mg/dL 17 21 18   Creatinine 0.61 - 1.24 mg/dL 1.07 1.24 1.14  BUN/Creat Ratio 9 - 20 - 17 16  Sodium 135 - 145 mmol/L 140 141 140  Potassium 3.5 - 5.1 mmol/L 4.2 3.9 4.1  Chloride 98 - 111 mmol/L 102 103 101  CO2 22 - 32 mmol/L 26 24 21   Calcium 8.9 - 10.3 mg/dL 9.7 9.5 9.5   Hepatic Function Panel     Component Value Date/Time   PROT 7.2 03/08/2019 1024   ALBUMIN 4.1 03/08/2019 1024   AST 17 03/08/2019 1024   ALT 12 03/08/2019 1024   ALKPHOS 101 03/08/2019 1024   BILITOT 1.1 03/08/2019 1024   CBC Latest Ref Rng & Units 11/29/2019 07/12/2017 02/11/2017  WBC 3.4 - 10.8 x10E3/uL 6.1 5.1 5.9  Hemoglobin 13.0 - 17.7 g/dL 14.9 15.4 16.2  Hematocrit 37.5 - 51.0 % 44.8 45.9 47.5  Platelets 150 - 450 x10E3/uL 207 179 198   A1C 6.1  CBG 89    Assessment & Plan:  I personally reviewed all images and lab data in the [facility] system as well as any outside material available during this office visit and agree with the  radiology impressions.   Hypertension Hypertension not well controlled due to the fact the patient is nonadherent with his medications I advised him he must take all medications as prescribed and have refilled all of his blood pressure medicines at this time  Diabetes mellitus type 2 with retinopathy ([place]) Type 2 diabetes which again due to lack of compliance to insulin A1c is crept up he was a controlled the last visit I have refilled all of his diabetic medications and he is to follow-up  again in close time.  Hyperlipidemia associated with type 2 diabetes mellitus ([place]) Continue atorvastatin  Lipoma Resolved with resection   [place] was seen today for follow-up.  Diagnoses and all orders for this visit:  Type 2 diabetes mellitus with retinopathy, with long-term current use of insulin, macular edema presence unspecified, unspecified laterality, unspecified retinopathy severity (HCC) -     Microalbumin / creatinine urine ratio -     HgB A1c  Essential hypertension -     hydrALAZINE (APRESOLINE) 50 MG tablet; Take 1 tablet (50 mg total) by mouth 3 (three) times daily. -     chlorthalidone (HYGROTON) 25 MG tablet; Take 1 tablet (25 mg total) by mouth daily. -     amLODipine (NORVASC) 10 MG tablet; Take 1 tablet (10 mg total) by mouth daily. -     atenolol (TENORMIN) 100 MG tablet; Take 1 tablet (100 mg total) by mouth daily.  Hyperlipidemia, unspecified hyperlipidemia type -     atorvastatin (LIPITOR) 40 MG tablet; Take 1 tablet (40 mg total) by mouth daily.  Eczema -     clobetasol ointment (  TEMOVATE) 0.05 %; Apply 1 application topically 3 (three) times daily as needed (rash.).  Diabetes mellitus type 2 in obese (HCC) -     Dulaglutide (TRULICITY) 5.97 CB/6.3AG SOPN; Inject 0.75 mg into the skin once a week. -     dapagliflozin propanediol (FARXIGA) 5 MG TABS tablet; Take 1 tablet (5 mg total) by mouth daily. -     metFORMIN (GLUCOPHAGE) 1000 MG tablet; Take 1 tablet (1,000 mg total) by mouth 2 (two) times daily with a meal.  Primary hypertension  Hyperlipidemia associated with type 2 diabetes mellitus ([place])  Lipoma of other specified sites  Other orders -     insulin detemir (LEVEMIR) 100 UNIT/ML injection; INJECT 40 UNITS SUBCUTANEOUSLY 2 TIMES DAILY

## 2020-03-15 LAB — MICROALBUMIN / CREATININE URINE RATIO
Creatinine, Urine: 77.4 mg/dL
Microalb/Creat Ratio: 114 mg/g creat — ABNORMAL HIGH (ref 0–29)
Microalbumin, Urine: 88.3 ug/mL

## 2020-03-17 ENCOUNTER — Other Ambulatory Visit: Payer: Self-pay | Admitting: Critical Care Medicine

## 2020-03-17 DIAGNOSIS — I10 Essential (primary) hypertension: Secondary | ICD-10-CM

## 2020-03-28 ENCOUNTER — Other Ambulatory Visit: Payer: Self-pay | Admitting: Critical Care Medicine

## 2020-03-28 DIAGNOSIS — I10 Essential (primary) hypertension: Secondary | ICD-10-CM

## 2020-03-28 DIAGNOSIS — E1169 Type 2 diabetes mellitus with other specified complication: Secondary | ICD-10-CM

## 2020-03-30 ENCOUNTER — Telehealth: Payer: Self-pay | Admitting: Plastic Surgery

## 2020-03-30 NOTE — Telephone Encounter (Signed)
Patient called to say that the stitches have fallen out except one and it is swollen in that area. Please call to advise. [phone]

## 2020-03-31 ENCOUNTER — Telehealth: Payer: Self-pay

## 2020-03-31 NOTE — Telephone Encounter (Signed)
Call returned to pt regarding his concerns with a remaining stitch. He reports that the sutures were removed at his last visit- but he notes that one suture remains & the area is red/swollen. He denies any chills/fever or drainage at the site. I consulted with Dr. Claudia Desanctis about his symptoms & he recommends that pt come in on Monday 04/03/20 with [place] to remove the remaining suture. Our front desk scheduler will call him to arrange this f/u visit. Pt understands & agrees with the plan of care.

## 2020-04-03 ENCOUNTER — Ambulatory Visit (INDEPENDENT_AMBULATORY_CARE_PROVIDER_SITE_OTHER): Payer: BC Managed Care – PPO | Admitting: Surgical

## 2020-04-03 ENCOUNTER — Encounter: Payer: Self-pay | Admitting: Surgical

## 2020-04-03 ENCOUNTER — Other Ambulatory Visit: Payer: Self-pay

## 2020-04-03 VITALS — BP 159/86 | HR 60 | Temp 98.0°F

## 2020-04-03 DIAGNOSIS — D170 Benign lipomatous neoplasm of skin and subcutaneous tissue of head, face and neck: Secondary | ICD-10-CM

## 2020-04-03 NOTE — Addendum Note (Signed)
Addended byRoetta Sessions on: 04/03/2020 11:01 AM   Modules accepted: Level of Service

## 2020-04-03 NOTE — Progress Notes (Signed)
Patient is a 54 year old male here for follow-up after excision of a lipoma from his right scalp.  He reports that he is doing well.  He is here for evaluation of a suture that has been bothering him.  He reports he is otherwise doing well.   On exam everything is healing well.  Incision CDI. He did have what appeared to be a PDS suture along the most inferior posterior aspect of the incision.  No erythema noted.  No swelling noted.  No other sutures were noted.  PDS sutures removed from the most posterior aspect of the incision.  Patient tolerated this fine.  There is no sign of infection.  Recommend following up as needed.  Call with any questions or concerns.  Recommend applying Vaseline to the area where the suture was removed for the next 2 days.  All his questions were answered.

## 2020-05-12 ENCOUNTER — Other Ambulatory Visit: Payer: Self-pay | Admitting: Critical Care Medicine

## 2020-05-12 DIAGNOSIS — E1169 Type 2 diabetes mellitus with other specified complication: Secondary | ICD-10-CM

## 2020-05-12 DIAGNOSIS — E669 Obesity, unspecified: Secondary | ICD-10-CM

## 2020-05-13 NOTE — Telephone Encounter (Signed)
Requested Prescriptions  Pending Prescriptions Disp Refills  . TRULICITY 0.27 OZ/3.6UY SOPN [Pharmacy Med Name: Trulicity 4.03 KV/4.2 mL subcutaneous pen injector] 2 mL 1    Sig: INJECT 0.75 INTO THE SKIN ONCE WEEKLY.     Endocrinology:  Diabetes - GLP-1 Receptor Agonists Passed - 05/12/2020  4:03 PM      Passed - HBA1C is between 0 and 7.9 and within 180 days    Hemoglobin A1C  Date Value Ref Range Status  03/14/2020 6.8 (A) 4.0 - 5.6 % Final   HbA1c, POC (controlled diabetic range)  Date Value Ref Range Status  08/31/2018 9.6 (A) 0.0 - 7.0 % Final         Passed - Valid encounter within last 6 months    Recent Outpatient Visits          2 months ago Type 2 diabetes mellitus with retinopathy, with long-term current use of insulin, macular edema presence unspecified, unspecified laterality, unspecified retinopathy severity ([place])   [place] [name], Justin Houston   5 months ago Pain and swelling of left ankle   [place], Justin Ferretti, Justin Houston   6 months ago Diabetes mellitus type 2 in obese [facility])   [name], Justin Houston   7 months ago Umbilical hernia, incarcerated   [place] Justin [place], Justin Rinks, Justin Houston   7 months ago Type 2 diabetes mellitus with retinopathy, with long-term current use of insulin, macular edema presence unspecified, unspecified laterality, unspecified retinopathy severity [facility])   [place], Justin Houston      Future Appointments            In 1 month Justin Gaskins [place] Harry, Justin Houston [place]           . BD INSULIN SYRINGE U/F 31G X 5/16" 1 ML MISC [Pharmacy Med Name: BD Insulin Syringe Ultra-Fine 1 mL 31 gauge x 5/16"] 100 each 11    Sig: USE TO INJECT INSULIN 2 TIMES DAILY AS DIRECTED     There is no refill protocol information for this order

## 2020-05-19 ENCOUNTER — Ambulatory Visit: Payer: BC Managed Care – PPO | Admitting: Podiatry

## 2020-05-19 ENCOUNTER — Other Ambulatory Visit: Payer: Self-pay

## 2020-05-19 DIAGNOSIS — E1169 Type 2 diabetes mellitus with other specified complication: Secondary | ICD-10-CM

## 2020-05-19 DIAGNOSIS — E1151 Type 2 diabetes mellitus with diabetic peripheral angiopathy without gangrene: Secondary | ICD-10-CM | POA: Diagnosis not present

## 2020-05-19 DIAGNOSIS — B351 Tinea unguium: Secondary | ICD-10-CM | POA: Diagnosis not present

## 2020-05-19 NOTE — Progress Notes (Signed)
  Subjective:  Patient ID: Justin Houston, male    DOB: [birth year],  MRN: [id number]  Chief Complaint  Patient presents with  . Nail Problem    Nail trim 1-5 bilateral  . Callouses    Left foot lateral aspect callous trim "It is painful currently"   55 y.o. male presents with the above complaint. History confirmed with patient.   Objective:  Physical Exam: warm, good capillary refill, nail exam onychomycosis of the toenails, no trophic changes or ulcerative lesions. DP pulses palpable and protective sensation intact PT pulse non-palp Left Foot: normal exam, no swelling, tenderness, instability; ligaments intact, full range of motion of all ankle/foot joints. HPK lateral 5th met base Right Foot: normal exam, no swelling, tenderness, instability; ligaments intact, full range of motion of all ankle/foot joints   No images are attached to the encounter.  Assessment:   1. Onychomycosis of multiple toenails with type 2 diabetes mellitus and peripheral angiopathy ([place])    Plan:  Patient was evaluated and treated and all questions answered.  Onychomycosis, Diabetes and PAD -Patient is diabetic with a qualifying condition for at risk foot care.  Procedure: Nail Debridement Type of Debridement: manual, sharp debridement. Instrumentation: Nail nipper, rotary burr. Number of Nails: 10  Return in about 3 weeks (around 06/09/2020).

## 2020-05-29 ENCOUNTER — Other Ambulatory Visit: Payer: Self-pay | Admitting: Critical Care Medicine

## 2020-06-11 NOTE — Progress Notes (Signed)
Subjective:    Patient ID: Justin Houston, male    DOB: [birth year], 55 y.o.   MRN: [id number]  This is a 55 year old male seen in f/u for T2DM, back pain, GERD, varicose veins.   The patient has been seen by clinical pharmacist with medication adjustments.  The pt was referred to podiatry and was seen.   Gen Surgery made but not yet occurred for umbilical hernia.  No dyspnea, pt is moving more.  CBGs much improved 90-100 fasting up to 120   Wt Readings from Last 3 Encounters: 11/02/18 : (!) 318 lb (144.2 kg) 08/31/18 : (!) 316 lb (143.3 kg) 11/28/17 : (!) 326 lb (147.9 kg)   11/23  This patient was last seen in July and at that visit we continued amlodipine 10 mg daily atenolol 50 mg daily chlorthalidone daily and hydralazine 3 times daily the patient's blood pressure remains quite labile since that time.  Also the patient has been seen by vascular surgery for varicose veins and they have prescribed thigh-high support hose.  The patient's been compliant with his diabetic program to include the Levemir Trulicity and Metformin along with [place].  His hemoglobin A1c today was 5.6 which is a significant reduction  We did refer the patient to general surgery to treat his umbilical hernia and lipoma on the head unfortunately he cannot afford the co-pay for the surgical procedure despite his insurance coverage.  We tried to find various methods to obtain support for this patient but have met many barriers.  We are attempting now to see if we can get financial discount for this patient despite his insurance status  Wt Readings from Last 3 Encounters: 03/08/19 : (!) 325 lb (147.4 kg) 12/31/18 : (!) 316 lb 9.6 oz (143.6 kg) 11/25/18 : (!) 319 lb 12.8 oz (145.1 kg)  10/05/2019 Last saw seen in November.  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  He also has a chronic umbilical hernia that is easily reducible.   This hernia is been present for some time.  He was hoping to achieve a combined surgical approach to remove the lipoma and repair the hernia which would require mesh placement.  The mass on the head is postauricular and getting slightly larger in size.  The patient's not able to afford his co-pay but he does have [employer].  Patient maintains aggressive glucose control using Trulicity weekly, Farxiga, Levemir insulin.  Note on arrival hemoglobin A1c is 6.1 blood glucose is 89  11/09/2019 This patient is seen in return follow-up for a lipoma of the skull and hypertension diabetes  Patient's glycemic control has been reasonable at home is 100 in the morning 180 postprandial  The patient has an appointment with plastic surgery to evaluate the removal of the lipoma on the head and he is in a take this on first and hold off on the umbilical hernia for now Patient has no new complaints he did receive his Covid vaccine there are no primary care gaps currently seen other than the fact he needs a repeat follow-up blood draw for his renal function  03/14/2020 This patient is seen return follow-up history of hypertension type 2 diabetes with retinopathy hyperlipidemia.  He did have his lipoma from his head removed recently and is done well with this.  Umbilical hernia is stable at this time.  He has no other new  real complaints except for a rash on the back of his chest area  Patient maintains the Metformin and Wilder Glade but does misses doses on occasionally also misses his Levemir dose in the evening and misses some blood pressure medicine doses including the hydralazine.  On arrival blood pressure is 183/92 and his hemoglobin A1c is 6.8  06/12/2020  Patient seen return follow-up for diabetes on arrival blood glucose 152 A1c 6.2 blood pressure is elevated with systolic 256/38  Patient is been compliant with amlodipine atenolol hydralazine and chlorthalidone for blood pressure  and for Farxiga and Metformin for his diabetes also takes Trulicity weekly  The patient has recovered from his scalp surgery for his lipoma.  Patient still has some occasional constipation but has no other complaints at this time.  He is requesting a 90-day supply of his medications.   Past Medical History:  Diagnosis Date  . Dermatitis   . Diabetes mellitus   . GERD (gastroesophageal reflux disease)   . Gout   . Hyperlipidemia   . Hypertension   . Lipoma of scalp   . Mass of head, right postauricular SQ 7x4cm 09/29/2013  . Obesity   . Toenail fungus 08/31/2018     Family History  Problem Relation Age of Onset  . Stroke Mother   . Hypertension Mother   . Diabetes Maternal Grandmother   . Prostate cancer Maternal Grandfather   . Diabetes Other      Social History   Socioeconomic History  . Marital status: Single    Spouse name: n/a  . Number of children: 0  . Years of education: 42  . Highest education level: Not on file  Occupational History  . Occupation: Software engineer: [employer]  Tobacco Use  . Smoking status: Former Smoker    Quit date: 09/05/1994    Years since quitting: 25.7  . Smokeless tobacco: Never Used  Vaping Use  . Vaping Use: Never used  Substance and Sexual Activity  . Alcohol use: No    Alcohol/week: 0.0 standard drinks  . Drug use: No  . Sexual activity: Never  Other Topics Concern  . Not on file  Social History Narrative   Master's Degree in Adult Education.  Lives alone.   No siblings.   Social Determinants of Health   Financial Resource Strain: Not on file  Food Insecurity: Not on file  Transportation Needs: Not on file  Physical Activity: Not on file  Stress: Not on file  Social Connections: Not on file  Intimate Partner Violence: Not on file     Allergies  Allergen Reactions  . Losartan Potassium-Hctz Hives  . Cozaar [Losartan Potassium] Hives    Hives      Outpatient Medications Prior to  Visit  Medication Sig Dispense Refill  . ALPHAGAN P 0.1 % SOLN Place 1 drop into both eyes 2 (two) times daily. 15 mL 1  . BD INSULIN SYRINGE U/F 31G X 5/16" 1 ML MISC USE TO INJECT INSULIN 2 TIMES DAILY AS DIRECTED 100 each 11  . Blood Glucose Monitoring Suppl (ONETOUCH VERIO) w/Device KIT Use as instructed to check blood sugar 3 times daily. E11.319 Z79.4 1 kit 0  . clobetasol ointment (TEMOVATE) 9.37 % Apply 1 application topically 3 (three) times daily as needed (rash.). 60 g 1  . colchicine 0.6 MG tablet Take 1 tablet (0.6 mg total) by mouth 2 (two) times daily as needed. 20 tablet 1  . glucose blood (ONETOUCH  VERIO) test strip USE AS INSTRUCTED TO CHECK BLOOD SUGAR 3 TIMES DAILY 100 strip 11  . omeprazole (PRILOSEC) 20 MG capsule TAKE 1 CAPSULE BY MOUTH EVERY DAY 90 capsule 3  . OneTouch Delica Lancets 47Q MISC Use as instructed to check blood sugar 3 times daily. E11.319 Z79.4 100 each 11  . STIMULANT LAXATIVE 8.6-50 MG tablet TAKE 1 TABLET BY MOUTH DAILY 30 tablet 1  . amLODipine (NORVASC) 10 MG tablet Take 1 tablet (10 mg total) by mouth daily. 90 tablet 2  . atenolol (TENORMIN) 100 MG tablet Take 1 tablet (100 mg total) by mouth daily. 90 tablet 2  . atorvastatin (LIPITOR) 40 MG tablet Take 1 tablet (40 mg total) by mouth daily. 90 tablet 3  . chlorthalidone (HYGROTON) 25 MG tablet TAKE 1 TABLET BY MOUTH EVERY DAY 30 tablet 0  . FARXIGA 5 MG TABS tablet TAKE 1 TABLET BY MOUTH EVERY DAY 30 tablet 0  . hydrALAZINE (APRESOLINE) 50 MG tablet Take 1 tablet (50 mg total) by mouth 3 (three) times daily. 90 tablet 3  . insulin detemir (LEVEMIR) 100 UNIT/ML injection INJECT 40 UNITS SUBCUTANEOUSLY 2 TIMES DAILY 20 mL 0  . Insulin Pen Needle 30G X 5 MM MISC 1 Stick by Does not apply route daily. 100 each 11  . metFORMIN (GLUCOPHAGE) 1000 MG tablet Take 1 tablet (1,000 mg total) by mouth 2 (two) times daily with a meal. 120 tablet 1  . TRULICITY 2.59 DG/3.8VF SOPN INJECT 0.75 INTO THE SKIN ONCE  WEEKLY. 2 mL 1   No facility-administered medications prior to visit.   Review of Systems  Constitutional: Negative.  Negative for fever.  HENT: Negative.   Eyes: Negative for visual disturbance.  Respiratory: Negative for cough, choking, chest tightness, shortness of breath and wheezing.   Cardiovascular: Negative for chest pain, palpitations and leg swelling.       Varicose veins  Gastrointestinal: Negative for abdominal distention, abdominal pain, anal bleeding, blood in stool, constipation, diarrhea, nausea, rectal pain and vomiting.  Endocrine: Negative.   Genitourinary: Negative.   Musculoskeletal: Negative.   Skin: Negative.  Negative for rash.  Neurological: Negative.   Hematological: Negative.   Psychiatric/Behavioral: Negative.        Objective:   Physical Exam Vitals:   06/12/20 1123  BP: (!) 159/75  Pulse: 64  SpO2: 98%  Weight: (!) 331 lb 6.4 oz (150.3 kg)  Height: 5' 11" (1.803 m)   Wt Readings from Last 3 Encounters:  06/12/20 (!) 331 lb 6.4 oz (150.3 kg)  03/14/20 (!) 331 lb 6.4 oz (150.3 kg)  01/28/20 (!) 330 lb 4 oz (149.8 kg)   Gen: Pleasant, obese, in no distress,  normal affect  ENT: No lesions,  mouth clear,  oropharynx clear, no postnasal drip, lipomatous lesion has resolved off the head with resection  Neck: No JVD, no TMG, no carotid bruits  Lungs: No use of accessory muscles, no dullness to percussion, clear without rales or rhonchi  Cardiovascular: RRR, heart sounds normal, no murmur or gallops, no peripheral edema, severe varicose veins  Abdomen: soft and NT, no HSM,  BS normal, umbilical hernia with part of bowel in hernia, this is reduceable  Musculoskeletal: No deformities, no cyanosis or clubbing   Neuro: alert, non focal  Skin: Warm, no lesions or rashes  BMP Latest Ref Rng & Units 01/26/2020 11/09/2019 03/08/2019  Glucose 70 - 99 mg/dL 114(H) 152(H) 126(H)  BUN 6 - 20 mg/dL _0 Creatinine  0.61 - 1.24 mg/dL 1.07 1.24  1.14  BUN/Creat Ratio 9 - 20 - 17 16  Sodium 135 - 145 mmol/L 140 141 140  Potassium 3.5 - 5.1 mmol/L 4.2 3.9 4.1  Chloride 98 - 111 mmol/L 102 103 101  CO2 22 - 32 mmol/L _0 Calcium 8.9 - 10.3 mg/dL 9.7 9.5 9.5   Hepatic Function Panel     Component Value Date/Time   PROT 7.2 03/08/2019 1024   ALBUMIN 4.1 03/08/2019 1024   AST 17 03/08/2019 1024   ALT 12 03/08/2019 1024   ALKPHOS 101 03/08/2019 1024   BILITOT 1.1 03/08/2019 1024   CBC Latest Ref Rng & Units 11/29/2019 07/12/2017 02/11/2017  WBC 3.4 - 10.8 x10E3/uL 6.1 5.1 5.9  Hemoglobin 13.0 - 17.7 g/dL 14.9 15.4 16.2  Hematocrit 37.5 - 51.0 % 44.8 45.9 47.5  Platelets 150 - 450 x10E3/uL 207 179 198   A1C 6.1  CBG 89    Assessment & Plan:  I personally reviewed all images and lab data in the [facility] system as well as any outside material available during this office visit and agree with the  radiology impressions.   Hypertension Hypertension not at goal plan will be to continue chlorthalidone 25 mg daily, amlodipine 10 mg daily, hydralazine 50 mg 3 times daily, plan will then also be to begin Coreg 25 mg twice daily and discontinue atenolol    Diabetes mellitus type 2 with retinopathy ([place]) Type 2 diabetes right with retinopathy patient has an upcoming ophthalmology appointment scheduled  Patient's A1c is now down to 6.2 I congratulated him on progress with this regard.  Patient to continue Trulicity weekly, Metformin 1000 mg twice daily, and Farxiga 5 mg daily.  Patient will also continue insulin Levemir 40 units twice daily  Patient also to continue atorvastatin 40 mg daily   Justin Houston was seen today for diabetes.  Diagnoses and all orders for this visit:  Type 2 diabetes mellitus with retinopathy, with long-term current use of insulin, macular edema presence unspecified, unspecified laterality, unspecified retinopathy severity ([facility]) -     POCT glucose (manual entry) -     POCT glycosylated hemoglobin (Hb  A1C)  Essential hypertension -     amLODipine (NORVASC) 10 MG tablet; Take 1 tablet (10 mg total) by mouth daily. -     hydrALAZINE (APRESOLINE) 50 MG tablet; Take 1 tablet (50 mg total) by mouth 3 (three) times daily. -     chlorthalidone (HYGROTON) 25 MG tablet; Take 1 tablet (25 mg total) by mouth daily.  Hyperlipidemia, unspecified hyperlipidemia type -     atorvastatin (LIPITOR) 40 MG tablet; Take 1 tablet (40 mg total) by mouth daily.  Diabetes mellitus type 2 in obese ([facility]) -     dapagliflozin propanediol (FARXIGA) 5 MG TABS tablet; Take 1 tablet (5 mg total) by mouth daily. -     metFORMIN (GLUCOPHAGE) 1000 MG tablet; Take 1 tablet (1,000 mg total) by mouth 2 (two) times daily with a meal. -     Dulaglutide (TRULICITY) 4.00 QQ/7.6PP SOPN; INJECT 0.75 INTO THE SKIN ONCE WEEKLY.  Primary hypertension  Other orders -     insulin detemir (LEVEMIR) 100 UNIT/ML injection; INJECT 40 UNITS SUBCUTANEOUSLY 2 TIMES DAILY -     Insulin Pen Needle 30G X 5 MM MISC; 1 Stick by Does not apply route daily. -     carvedilol (COREG) 25 MG tablet; Take 1 tablet (25 mg total) by mouth 2 (two) times  daily with a meal.

## 2020-06-12 ENCOUNTER — Ambulatory Visit: Payer: BC Managed Care – PPO | Attending: Critical Care Medicine | Admitting: Critical Care Medicine

## 2020-06-12 ENCOUNTER — Encounter: Payer: Self-pay | Admitting: Critical Care Medicine

## 2020-06-12 ENCOUNTER — Other Ambulatory Visit: Payer: Self-pay

## 2020-06-12 ENCOUNTER — Ambulatory Visit: Payer: BC Managed Care – PPO | Admitting: Critical Care Medicine

## 2020-06-12 VITALS — BP 159/75 | HR 64 | Ht 71.0 in | Wt 331.4 lb

## 2020-06-12 DIAGNOSIS — E669 Obesity, unspecified: Secondary | ICD-10-CM

## 2020-06-12 DIAGNOSIS — E785 Hyperlipidemia, unspecified: Secondary | ICD-10-CM | POA: Diagnosis not present

## 2020-06-12 DIAGNOSIS — Z794 Long term (current) use of insulin: Secondary | ICD-10-CM | POA: Diagnosis not present

## 2020-06-12 DIAGNOSIS — I10 Essential (primary) hypertension: Secondary | ICD-10-CM | POA: Diagnosis not present

## 2020-06-12 DIAGNOSIS — E11319 Type 2 diabetes mellitus with unspecified diabetic retinopathy without macular edema: Secondary | ICD-10-CM

## 2020-06-12 DIAGNOSIS — E1169 Type 2 diabetes mellitus with other specified complication: Secondary | ICD-10-CM

## 2020-06-12 LAB — POCT GLYCOSYLATED HEMOGLOBIN (HGB A1C): HbA1c, POC (controlled diabetic range): 6.2 % (ref 0.0–7.0)

## 2020-06-12 LAB — GLUCOSE, POCT (MANUAL RESULT ENTRY): POC Glucose: 152 mg/dl — AB (ref 70–99)

## 2020-06-12 MED ORDER — TRULICITY 0.75 MG/0.5ML ~~LOC~~ SOAJ: SUBCUTANEOUS | 3 refills | Status: DC

## 2020-06-12 MED ORDER — AMLODIPINE BESYLATE 10 MG PO TABS: 10.0000 mg | ORAL_TABLET | Freq: Every day | ORAL | 2 refills | Status: DC

## 2020-06-12 MED ORDER — CARVEDILOL 25 MG PO TABS: 25.0000 mg | ORAL_TABLET | Freq: Two times a day (BID) | ORAL | 2 refills | Status: DC

## 2020-06-12 MED ORDER — INSULIN PEN NEEDLE 30G X 5 MM MISC
1.0000 | Freq: Every day | 11 refills | Status: DC
Start: 2020-06-12 — End: 2021-01-01

## 2020-06-12 MED ORDER — CHLORTHALIDONE 25 MG PO TABS: 25.0000 mg | ORAL_TABLET | Freq: Every day | ORAL | Status: DC

## 2020-06-12 MED ORDER — INSULIN DETEMIR 100 UNIT/ML ~~LOC~~ SOLN
SUBCUTANEOUS | 6 refills | Status: DC
Start: 2020-06-12 — End: 2020-10-03

## 2020-06-12 MED ORDER — DAPAGLIFLOZIN PROPANEDIOL 5 MG PO TABS: 5.0000 mg | ORAL_TABLET | Freq: Every day | ORAL | 6 refills | Status: DC

## 2020-06-12 MED ORDER — HYDRALAZINE HCL 50 MG PO TABS: 50.0000 mg | ORAL_TABLET | Freq: Three times a day (TID) | ORAL | 3 refills | Status: DC

## 2020-06-12 MED ORDER — ATORVASTATIN CALCIUM 40 MG PO TABS: 40.0000 mg | ORAL_TABLET | Freq: Every day | ORAL | 3 refills | Status: DC

## 2020-06-12 MED ORDER — METFORMIN HCL 1000 MG PO TABS: 1000.0000 mg | ORAL_TABLET | Freq: Two times a day (BID) | ORAL | 1 refills | Status: DC

## 2020-06-12 NOTE — Patient Instructions (Signed)
Stop Atenlol Start Coreg Carvedolol 25mg  twice daily  Stay on chlorthaladone, hydralazine, amlopdipine  A1C is GREAT at 6.2  No change in your diabetic medications  Refills on all medications sent to [place]  Please follow up on your eye exam  Return Dr Joya Gaskins 2 months

## 2020-06-12 NOTE — Assessment & Plan Note (Signed)
Hypertension not at goal plan will be to continue chlorthalidone 25 mg daily, amlodipine 10 mg daily, hydralazine 50 mg 3 times daily, plan will then also be to begin Coreg 25 mg twice daily and discontinue atenolol

## 2020-06-12 NOTE — Assessment & Plan Note (Signed)
Type 2 diabetes right with retinopathy patient has an upcoming ophthalmology appointment scheduled  Patient's A1c is now down to 6.2 I congratulated him on progress with this regard.  Patient to continue Trulicity weekly, Metformin 1000 mg twice daily, and Farxiga 5 mg daily.  Patient will also continue insulin Levemir 40 units twice daily  Patient also to continue atorvastatin 40 mg daily

## 2020-08-04 ENCOUNTER — Other Ambulatory Visit: Payer: Self-pay | Admitting: Critical Care Medicine

## 2020-08-04 DIAGNOSIS — I10 Essential (primary) hypertension: Secondary | ICD-10-CM

## 2020-08-07 NOTE — Progress Notes (Deleted)
Subjective:    Patient ID: Justin Houston, male    DOB: [birth year], 55 y.o.   MRN: [id number]  This is a 55 year old male seen in f/u for T2DM, back pain, GERD, varicose veins.   The patient has been seen by clinical pharmacist with medication adjustments.  The pt was referred to podiatry and was seen.   Gen Surgery made but not yet occurred for umbilical hernia.  No dyspnea, pt is moving more.  CBGs much improved 90-100 fasting up to 120   Wt Readings from Last 3 Encounters: 11/02/18 : (!) 318 lb (144.2 kg) 08/31/18 : (!) 316 lb (143.3 kg) 11/28/17 : (!) 326 lb (147.9 kg)   11/23  This patient was last seen in July and at that visit we continued amlodipine 10 mg daily atenolol 50 mg daily chlorthalidone daily and hydralazine 3 times daily the patient's blood pressure remains quite labile since that time.  Also the patient has been seen by vascular surgery for varicose veins and they have prescribed thigh-high support hose.  The patient's been compliant with his diabetic program to include the Levemir Trulicity and Metformin along with [place].  His hemoglobin A1c today was 5.6 which is a significant reduction  We did refer the patient to general surgery to treat his umbilical hernia and lipoma on the head unfortunately he cannot afford the co-pay for the surgical procedure despite his insurance coverage.  We tried to find various methods to obtain support for this patient but have met many barriers.  We are attempting now to see if we can get financial discount for this patient despite his insurance status  Wt Readings from Last 3 Encounters: 03/08/19 : (!) 325 lb (147.4 kg) 12/31/18 : (!) 316 lb 9.6 oz (143.6 kg) 11/25/18 : (!) 319 lb 12.8 oz (145.1 kg)  10/05/2019 Last saw seen in November.  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  He also has a chronic umbilical hernia that is easily reducible.   This hernia is been present for some time.  He was hoping to achieve a combined surgical approach to remove the lipoma and repair the hernia which would require mesh placement.  The mass on the head is postauricular and getting slightly larger in size.  The patient's not able to afford his co-pay but he does have [employer].  Patient maintains aggressive glucose control using Trulicity weekly, Farxiga, Levemir insulin.  Note on arrival hemoglobin A1c is 6.1 blood glucose is 89  11/09/2019 This patient is seen in return follow-up for a lipoma of the skull and hypertension diabetes  Patient's glycemic control has been reasonable at home is 100 in the morning 180 postprandial  The patient has an appointment with plastic surgery to evaluate the removal of the lipoma on the head and he is in a take this on first and hold off on the umbilical hernia for now Patient has no new complaints he did receive his Covid vaccine there are no primary care gaps currently seen other than the fact he needs a repeat follow-up blood draw for his renal function  03/14/2020 This patient is seen return follow-up history of hypertension type 2 diabetes with retinopathy hyperlipidemia.  He did have his lipoma from his head removed recently and is done well with this.  Umbilical hernia is stable at this time.  He has no other new  real complaints except for a rash on the back of his chest area  Patient maintains the Metformin and Wilder Glade but does misses doses on occasionally also misses his Levemir dose in the evening and misses some blood pressure medicine doses including the hydralazine.  On arrival blood pressure is 183/92 and his hemoglobin A1c is 6.8  06/12/2020  Patient seen return follow-up for diabetes on arrival blood glucose 152 A1c 6.2 blood pressure is elevated with systolic 177/93  Patient is been compliant with amlodipine atenolol hydralazine and chlorthalidone for blood pressure  and for Farxiga and Metformin for his diabetes also takes Trulicity weekly  The patient has recovered from his scalp surgery for his lipoma.  Patient still has some occasional constipation but has no other complaints at this time.  He is requesting a 90-day supply of his medications.  08/14/20  Hypertension Hypertension not at goal plan will be to continue chlorthalidone 25 mg daily, amlodipine 10 mg daily, hydralazine 50 mg 3 times daily, plan will then also be to begin Coreg 25 mg twice daily and discontinue atenolol    Diabetes mellitus type 2 with retinopathy ([place]) Type 2 diabetes right with retinopathy patient has an upcoming ophthalmology appointment scheduled  Patient's A1c is now down to 6.2 I congratulated him on progress with this regard.  Patient to continue Trulicity weekly, Metformin 1000 mg twice daily, and Farxiga 5 mg daily.  Patient will also continue insulin Levemir 40 units twice daily  Patient also to continue atorvastatin 40 mg daily   Wendel was seen today for diabetes.  Diagnoses and all orders for this visit:  Type 2 diabetes mellitus with retinopathy, with long-term current use of insulin, macular edema presence unspecified, unspecified laterality, unspecified retinopathy severity (HCC) -     POCT glucose (manual entry) -     POCT glycosylated hemoglobin (Hb A1C)  Essential hypertension -     amLODipine (NORVASC) 10 MG tablet; Take 1 tablet (10 mg total) by mouth daily. -     hydrALAZINE (APRESOLINE) 50 MG tablet; Take 1 tablet (50 mg total) by mouth 3 (three) times daily. -     chlorthalidone (HYGROTON) 25 MG tablet; Take 1 tablet (25 mg total) by mouth daily.  Hyperlipidemia, unspecified hyperlipidemia type -     atorvastatin (LIPITOR) 40 MG tablet; Take 1 tablet (40 mg total) by mouth daily.  Diabetes mellitus type 2 in obese (HCC) -     dapagliflozin propanediol (FARXIGA) 5 MG TABS tablet; Take 1 tablet (5 mg total) by mouth daily. -     metFORMIN  (GLUCOPHAGE) 1000 MG tablet; Take 1 tablet (1,000 mg total) by mouth 2 (two) times daily with a meal. -     Dulaglutide (TRULICITY) 9.03 ES/9.2ZR SOPN; INJECT 0.75 INTO THE SKIN ONCE WEEKLY.  Primary hypertension  Other orders -     insulin detemir (LEVEMIR) 100 UNIT/ML injection; INJECT 40 UNITS SUBCUTANEOUSLY 2 TIMES DAILY -     Insulin Pen Needle 30G X 5 MM MISC; 1 Stick by Does not apply route daily. -     carvedilol (COREG) 25 MG tablet; Take 1 tablet (25 mg total) by mouth 2 (two) times daily with a meal.    Past Medical History:  Diagnosis Date  . Dermatitis   . Diabetes mellitus   . GERD (gastroesophageal reflux disease)   . Gout   . Hyperlipidemia   . Hypertension   . Lipoma of scalp   . Mass of head, right postauricular SQ 7x4cm  09/29/2013  . Obesity   . Toenail fungus 08/31/2018     Family History  Problem Relation Age of Onset  . Stroke Mother   . Hypertension Mother   . Diabetes Maternal Grandmother   . Prostate cancer Maternal Grandfather   . Diabetes Other      Social History   Socioeconomic History  . Marital status: Single    Spouse name: n/a  . Number of children: 0  . Years of education: 22  . Highest education level: Not on file  Occupational History  . Occupation: Software engineer: [employer]  Tobacco Use  . Smoking status: Former Smoker    Quit date: 09/05/1994    Years since quitting: 25.9  . Smokeless tobacco: Never Used  Vaping Use  . Vaping Use: Never used  Substance and Sexual Activity  . Alcohol use: No    Alcohol/week: 0.0 standard drinks  . Drug use: No  . Sexual activity: Never  Other Topics Concern  . Not on file  Social History Narrative   Master's Degree in Adult Education.  Lives alone.   No siblings.   Social Determinants of Health   Financial Resource Strain: Not on file  Food Insecurity: Not on file  Transportation Needs: Not on file  Physical Activity: Not on file  Stress: Not on  file  Social Connections: Not on file  Intimate Partner Violence: Not on file     Allergies  Allergen Reactions  . Losartan Potassium-Hctz Hives  . Cozaar [Losartan Potassium] Hives    Hives      Outpatient Medications Prior to Visit  Medication Sig Dispense Refill  . ALPHAGAN P 0.1 % SOLN Place 1 drop into both eyes 2 (two) times daily. 15 mL 1  . amLODipine (NORVASC) 10 MG tablet Take 1 tablet (10 mg total) by mouth daily. 90 tablet 2  . atorvastatin (LIPITOR) 40 MG tablet Take 1 tablet (40 mg total) by mouth daily. 90 tablet 3  . BD INSULIN SYRINGE U/F 31G X 5/16" 1 ML MISC USE TO INJECT INSULIN 2 TIMES DAILY AS DIRECTED 100 each 11  . Blood Glucose Monitoring Suppl (ONETOUCH VERIO) w/Device KIT Use as instructed to check blood sugar 3 times daily. E11.319 Z79.4 1 kit 0  . carvedilol (COREG) 25 MG tablet Take 1 tablet (25 mg total) by mouth 2 (two) times daily with a meal. 120 tablet 2  . chlorthalidone (HYGROTON) 25 MG tablet TAKE 1 TABLET BY MOUTH EVERY DAY 30 tablet 2  . clobetasol ointment (TEMOVATE) 2.72 % Apply 1 application topically 3 (three) times daily as needed (rash.). 60 g 1  . colchicine 0.6 MG tablet Take 1 tablet (0.6 mg total) by mouth 2 (two) times daily as needed. 20 tablet 1  . dapagliflozin propanediol (FARXIGA) 5 MG TABS tablet Take 1 tablet (5 mg total) by mouth daily. 30 tablet 6  . Dulaglutide (TRULICITY) 5.36 UY/4.0HK SOPN INJECT 0.75 INTO THE SKIN ONCE WEEKLY. 2 mL 3  . glucose blood (ONETOUCH VERIO) test strip USE AS INSTRUCTED TO CHECK BLOOD SUGAR 3 TIMES DAILY 100 strip 11  . hydrALAZINE (APRESOLINE) 50 MG tablet Take 1 tablet (50 mg total) by mouth 3 (three) times daily. 90 tablet 3  . insulin detemir (LEVEMIR) 100 UNIT/ML injection INJECT 40 UNITS SUBCUTANEOUSLY 2 TIMES DAILY 20 mL 6  . Insulin Pen Needle 30G X 5 MM MISC 1 Stick by Does not apply route daily. 100 each 11  .  metFORMIN (GLUCOPHAGE) 1000 MG tablet Take 1 tablet (1,000 mg total) by mouth 2  (two) times daily with a meal. 120 tablet 1  . omeprazole (PRILOSEC) 20 MG capsule TAKE 1 CAPSULE BY MOUTH EVERY DAY 90 capsule 3  . OneTouch Delica Lancets 26R MISC Use as instructed to check blood sugar 3 times daily. E11.319 Z79.4 100 each 11  . STIMULANT LAXATIVE 8.6-50 MG tablet TAKE 1 TABLET BY MOUTH DAILY 30 tablet 1   No facility-administered medications prior to visit.   Review of Systems  Constitutional: Negative.  Negative for fever.  HENT: Negative.   Eyes: Negative for visual disturbance.  Respiratory: Negative for cough, choking, chest tightness, shortness of breath and wheezing.   Cardiovascular: Negative for chest pain, palpitations and leg swelling.       Varicose veins  Gastrointestinal: Negative for abdominal distention, abdominal pain, anal bleeding, blood in stool, constipation, diarrhea, nausea, rectal pain and vomiting.  Endocrine: Negative.   Genitourinary: Negative.   Musculoskeletal: Negative.   Skin: Negative.  Negative for rash.  Neurological: Negative.   Hematological: Negative.   Psychiatric/Behavioral: Negative.        Objective:   Physical Exam There were no vitals filed for this visit. Wt Readings from Last 3 Encounters:  06/12/20 (!) 331 lb 6.4 oz (150.3 kg)  03/14/20 (!) 331 lb 6.4 oz (150.3 kg)  01/28/20 (!) 330 lb 4 oz (149.8 kg)   Gen: Pleasant, obese, in no distress,  normal affect  ENT: No lesions,  mouth clear,  oropharynx clear, no postnasal drip, lipomatous lesion has resolved off the head with resection  Neck: No JVD, no TMG, no carotid bruits  Lungs: No use of accessory muscles, no dullness to percussion, clear without rales or rhonchi  Cardiovascular: RRR, heart sounds normal, no murmur or gallops, no peripheral edema, severe varicose veins  Abdomen: soft and NT, no HSM,  BS normal, umbilical hernia with part of bowel in hernia, this is reduceable  Musculoskeletal: No deformities, no cyanosis or clubbing   Neuro: alert, non  focal  Skin: Warm, no lesions or rashes  BMP Latest Ref Rng & Units 01/26/2020 11/09/2019 03/08/2019  Glucose 70 - 99 mg/dL 114(H) 152(H) 126(H)  BUN 6 - 20 mg/dL _0 Creatinine 0.61 - 1.24 mg/dL 1.07 1.24 1.14  BUN/Creat Ratio 9 - 20 - 17 16  Sodium 135 - 145 mmol/L 140 141 140  Potassium 3.5 - 5.1 mmol/L 4.2 3.9 4.1  Chloride 98 - 111 mmol/L 102 103 101  CO2 22 - 32 mmol/L _1 Calcium 8.9 - 10.3 mg/dL 9.7 9.5 9.5   Hepatic Function Panel     Component Value Date/Time   PROT 7.2 03/08/2019 1024   ALBUMIN 4.1 03/08/2019 1024   AST 17 03/08/2019 1024   ALT 12 03/08/2019 1024   ALKPHOS 101 03/08/2019 1024   BILITOT 1.1 03/08/2019 1024   CBC Latest Ref Rng & Units 11/29/2019 07/12/2017 02/11/2017  WBC 3.4 - 10.8 x10E3/uL 6.1 5.1 5.9  Hemoglobin 13.0 - 17.7 g/dL 14.9 15.4 16.2  Hematocrit 37.5 - 51.0 % 44.8 45.9 47.5  Platelets 150 - 450 x10E3/uL 207 179 198   A1C 6.1  CBG 89    Assessment & Plan:  I personally reviewed all images and lab data in the [facility] system as well as any outside material available during this office visit and agree with the  radiology impressions.   No problem-specific Assessment & Plan notes found  for this encounter.   There are no diagnoses linked to this encounter.

## 2020-08-14 ENCOUNTER — Ambulatory Visit: Payer: BC Managed Care – PPO | Admitting: Critical Care Medicine

## 2020-08-18 ENCOUNTER — Ambulatory Visit (INDEPENDENT_AMBULATORY_CARE_PROVIDER_SITE_OTHER): Payer: BC Managed Care – PPO | Admitting: Podiatry

## 2020-08-18 DIAGNOSIS — Z5329 Procedure and treatment not carried out because of patient's decision for other reasons: Secondary | ICD-10-CM

## 2020-08-18 NOTE — Progress Notes (Signed)
No show for appt. 

## 2020-09-27 ENCOUNTER — Telehealth: Payer: Self-pay | Admitting: *Deleted

## 2020-09-27 ENCOUNTER — Telehealth: Payer: Self-pay | Admitting: Critical Care Medicine

## 2020-09-27 NOTE — Telephone Encounter (Signed)
Client from [place] called this CN with complaints of pain in hip and legs are swollen.  Client says he may have a bladder infection.  Client asked about walk-in clinics.  I gave client information about Wednesday clinic with Dr. Joya Gaskins and also gave him clinic information of site on [address].  Client to call to see if clinic accepst state [employer].  Client to follow up with this CN as needed.  Karene Fry, RN, MSN, [place] Office [phone] Cell

## 2020-09-27 NOTE — Telephone Encounter (Signed)
Sorry saw this too late  Can we find out what the issue is?  Perhaps work him in with mobile health

## 2020-09-27 NOTE — Telephone Encounter (Signed)
Copied from [place] [id number]. Topic: General - Other >> Sep 27, 2020 10:28 AM Yvette Rack wrote: Reason for CRM: Pt called to ask if Dr. Joya Gaskins will be having the clinic today between [place]. Cb# [phone]

## 2020-09-28 NOTE — Telephone Encounter (Signed)
Called Patient and left vm to call [phone] so we can speak with him about what's going on and offer the location of the mobile unit.

## 2020-09-28 NOTE — Telephone Encounter (Signed)
Please call patient and refer to mobile unit.

## 2020-09-29 ENCOUNTER — Ambulatory Visit: Payer: BC Managed Care – PPO | Admitting: Podiatry

## 2020-10-03 ENCOUNTER — Ambulatory Visit: Payer: BC Managed Care – PPO | Admitting: Critical Care Medicine

## 2020-10-03 ENCOUNTER — Other Ambulatory Visit: Payer: Self-pay

## 2020-10-03 ENCOUNTER — Ambulatory Visit: Payer: BC Managed Care – PPO | Attending: Critical Care Medicine | Admitting: Critical Care Medicine

## 2020-10-03 ENCOUNTER — Encounter: Payer: Self-pay | Admitting: Critical Care Medicine

## 2020-10-03 VITALS — BP 152/89 | HR 77 | Resp 16 | Wt 333.0 lb

## 2020-10-03 DIAGNOSIS — E785 Hyperlipidemia, unspecified: Secondary | ICD-10-CM

## 2020-10-03 DIAGNOSIS — M25552 Pain in left hip: Secondary | ICD-10-CM | POA: Diagnosis not present

## 2020-10-03 DIAGNOSIS — I10 Essential (primary) hypertension: Secondary | ICD-10-CM

## 2020-10-03 DIAGNOSIS — E1169 Type 2 diabetes mellitus with other specified complication: Secondary | ICD-10-CM

## 2020-10-03 DIAGNOSIS — Z794 Long term (current) use of insulin: Secondary | ICD-10-CM

## 2020-10-03 DIAGNOSIS — E669 Obesity, unspecified: Secondary | ICD-10-CM

## 2020-10-03 DIAGNOSIS — E11319 Type 2 diabetes mellitus with unspecified diabetic retinopathy without macular edema: Secondary | ICD-10-CM

## 2020-10-03 DIAGNOSIS — E113399 Type 2 diabetes mellitus with moderate nonproliferative diabetic retinopathy without macular edema, unspecified eye: Secondary | ICD-10-CM

## 2020-10-03 DIAGNOSIS — Z6841 Body Mass Index (BMI) 40.0 and over, adult: Secondary | ICD-10-CM

## 2020-10-03 MED ORDER — CHLORTHALIDONE 25 MG PO TABS: 25.0000 mg | ORAL_TABLET | Freq: Every day | ORAL | 2 refills | Status: DC

## 2020-10-03 MED ORDER — AMLODIPINE BESYLATE 10 MG PO TABS
10.0000 mg | ORAL_TABLET | Freq: Every day | ORAL | 2 refills | Status: DC
Start: 2020-10-03 — End: 2021-01-01

## 2020-10-03 MED ORDER — TRULICITY 0.75 MG/0.5ML ~~LOC~~ SOAJ: SUBCUTANEOUS | 3 refills | Status: DC

## 2020-10-03 MED ORDER — CARVEDILOL 25 MG PO TABS: 25.0000 mg | ORAL_TABLET | Freq: Two times a day (BID) | ORAL | 2 refills | Status: DC

## 2020-10-03 MED ORDER — METFORMIN HCL 1000 MG PO TABS: 1000.0000 mg | ORAL_TABLET | Freq: Two times a day (BID) | ORAL | 1 refills | Status: DC

## 2020-10-03 MED ORDER — INSULIN DETEMIR 100 UNIT/ML ~~LOC~~ SOLN: SUBCUTANEOUS | 6 refills | Status: DC

## 2020-10-03 MED ORDER — HYDRALAZINE HCL 50 MG PO TABS: 50.0000 mg | ORAL_TABLET | Freq: Three times a day (TID) | ORAL | 3 refills | Status: DC

## 2020-10-03 MED ORDER — DAPAGLIFLOZIN PROPANEDIOL 5 MG PO TABS: 5.0000 mg | ORAL_TABLET | Freq: Every day | ORAL | 6 refills | Status: DC

## 2020-10-03 MED ORDER — MELOXICAM 15 MG PO TABS: 15.0000 mg | ORAL_TABLET | Freq: Every day | ORAL | 2 refills | Status: DC

## 2020-10-03 MED ORDER — ATORVASTATIN CALCIUM 40 MG PO TABS: 40.0000 mg | ORAL_TABLET | Freq: Every day | ORAL | 3 refills | Status: DC

## 2020-10-03 NOTE — Assessment & Plan Note (Signed)
Patient recommended to continue to follow a weight loss program as prescribed

## 2020-10-03 NOTE — Patient Instructions (Signed)
Refills on all your medications sent to your friendly pharmacy  No medication change was made in dosing  Discontinue Naprosyn and try meloxicam 1 daily for hip pain  X-rays of the left hip will be obtained you need to go to [facility] to the first floor at the [facility] radiology department you do not need an appointment its walk-in  Complete set of lab draws obtained today  He received a Prevnar 15 pneumococcal vaccine today  Please obtain your COVID fourth vaccine you can call the number below to find a location for the vaccine COVID-19 Vaccine Information can be found at: [email or web address] For questions related to vaccine distribution or appointments, please email [email or web address] or call [phone].   Remember to keep your eye appointment in July  Return to see Dr. Joya Gaskins in 3 months

## 2020-10-03 NOTE — Assessment & Plan Note (Signed)
Patient has follow-up with his ophthalmologist

## 2020-10-03 NOTE — Assessment & Plan Note (Signed)
Unclear etiology will change naproxen to meloxicam 15 mg daily and plan to obtain imaging of the pelvis and left hip

## 2020-10-03 NOTE — Assessment & Plan Note (Signed)
Not well controlled with patient has not been taking his hydralazine patient reeducated as to the proper use of his blood pressure medications will not make changes for now refills given

## 2020-10-03 NOTE — Assessment & Plan Note (Signed)
Diabetes type 2 with retinopathy patient is stable at this time we will continue current medication profile

## 2020-10-03 NOTE — Progress Notes (Signed)
Established Patient Office Visit  Subjective:  Patient ID: Justin Houston, male    DOB: [birth year]  Age: 55 y.o. MRN: [id number]  CC: Left hip pain  HPI BUNNIE LEDERMAN presents for new onset left hip pain note on arrival blood pressure was elevated at 152/89 and is persistently elevated when rechecked.  Patient denies any injury he did have some swelling in the lower extremities.  The pain is in the left pelvic area and hip area.  He went to urgent care was given a course of Naprosyn and cyclobenzaprine which is helped to some degree but not fully resolved the condition.  No imaging studies have been obtained.  Patient continues to weigh in a 46+ BMI at weight of 333 pounds.  Patient states his blood sugars are not significantly elevated he does maintain Trulicity weekly, insulin Levemir 40 units twice daily, Farxiga 5 mg daily, metformin 1000 mg twice daily.  He does an upcoming eye exam  Patient has no other real complaints at this time.  He continues to work as a Pharmacist, hospital in the school system.  Past Medical History:  Diagnosis Date   Dermatitis    Diabetes mellitus    GERD (gastroesophageal reflux disease)    Gout    Hyperlipidemia    Hypertension    Lipoma of scalp    Mass of head, right postauricular SQ 7x4cm 09/29/2013   Obesity    Toenail fungus 08/31/2018    Past Surgical History:  Procedure Laterality Date   COLONOSCOPY WITH PROPOFOL N/A 12/01/2014   Procedure: COLONOSCOPY WITH PROPOFOL;  Surgeon: [name], MD;  Location: [facility];  Service: Endoscopy;  Laterality: N/A;   LIPOMA EXCISION N/A 01/28/2020   Procedure: EXCISION RIGHT SCALP LIPOMA;  Surgeon: Cindra Presume, MD;  Location: [facility];  Service: Plastics;  Laterality: N/A;   right arm ORIF      Family History  Problem Relation Age of Onset   Stroke Mother    Hypertension Mother    Diabetes Maternal Grandmother    Prostate cancer Maternal Grandfather    Diabetes Other      Social History   Socioeconomic History   Marital status: Single    Spouse name: n/a   Number of children: 0   Years of education: 18   Highest education level: Not on file  Occupational History   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  Lives alone.   No siblings.   Social Determinants of Health   Financial Resource Strain: Not on file  Food Insecurity: Not on file  Transportation Needs: Not on file  Physical Activity: Not on file  Stress: Not on file  Social Connections: Not on file  Intimate Partner Violence: Not on file    Outpatient Medications Prior to Visit  Medication Sig Dispense Refill   ALPHAGAN P 0.1 % SOLN Place 1 drop into both eyes 2 (two)  times daily. 15 mL 1   BD INSULIN SYRINGE U/F 31G X 5/16" 1 ML MISC USE TO INJECT INSULIN 2 TIMES DAILY AS DIRECTED 100 each 11   Blood Glucose Monitoring Suppl (ONETOUCH VERIO) w/Device KIT Use as instructed to check blood sugar 3 times daily. E11.319 Z79.4 1 kit 0   clobetasol ointment (TEMOVATE) 8.88 % Apply 1 application topically 3 (three) times daily as needed (rash.). 60 g 1   colchicine 0.6 MG tablet Take 1 tablet (0.6 mg total) by mouth 2 (two) times daily as needed. 20 tablet 1   cyclobenzaprine (FLEXERIL) 10 MG tablet Take 1 tablet by mouth 2 (two) times daily as needed.     glucose blood (ONETOUCH VERIO) test strip USE AS INSTRUCTED TO CHECK BLOOD SUGAR 3 TIMES DAILY 100 strip 11   Insulin Pen Needle 30G X 5 MM MISC 1 Stick by Does not apply route daily.  100 each 11   OneTouch Delica Lancets 28M MISC Use as instructed to check blood sugar 3 times daily. E11.319 Z79.4 100 each 11   STIMULANT LAXATIVE 8.6-50 MG tablet TAKE 1 TABLET BY MOUTH DAILY 30 tablet 1   amLODipine (NORVASC) 10 MG tablet Take 1 tablet (10 mg total) by mouth daily. 90 tablet 2   atorvastatin (LIPITOR) 40 MG tablet Take 1 tablet (40 mg total) by mouth daily. 90 tablet 3   carvedilol (COREG) 25 MG tablet Take 1 tablet (25 mg total) by mouth 2 (two) times daily with a meal. 120 tablet 2   chlorthalidone (HYGROTON) 25 MG tablet TAKE 1 TABLET BY MOUTH EVERY DAY 30 tablet 2   dapagliflozin propanediol (FARXIGA) 5 MG TABS tablet Take 1 tablet (5 mg total) by mouth daily. 30 tablet 6   Dulaglutide (TRULICITY) 0.34 JZ/7.9XT SOPN INJECT 0.75 INTO THE SKIN ONCE WEEKLY. 2 mL 3   hydrALAZINE (APRESOLINE) 50 MG tablet Take 1 tablet (50 mg total) by mouth 3 (three) times daily. 90 tablet 3   insulin detemir (LEVEMIR) 100 UNIT/ML injection INJECT 40 UNITS SUBCUTANEOUSLY 2 TIMES DAILY 20 mL 6   metFORMIN (GLUCOPHAGE) 1000 MG tablet Take 1 tablet (1,000 mg total) by mouth 2 (two) times daily with a meal. 120 tablet 1   omeprazole (PRILOSEC) 20 MG capsule TAKE 1 CAPSULE BY MOUTH EVERY DAY 90 capsule 3   naproxen (NAPROSYN) 500 MG tablet SMARTSIG:1 Tablet(s) By Mouth Every 12 Hours PRN     No facility-administered medications prior to visit.    Allergies  Allergen Reactions   Losartan Potassium-Hctz Hives   Cozaar [Losartan Potassium] Hives    Hives     ROS Review of Systems  HENT: Negative.    Respiratory: Negative.    Cardiovascular: Negative.   Gastrointestinal: Negative.   Genitourinary: Negative.   Musculoskeletal:        L hip pain  Neurological: Negative.  Negative for tremors and weakness.  Psychiatric/Behavioral:  Negative for dysphoric mood. The patient is not nervous/anxious.      Objective:    Physical Exam Constitutional:      Appearance: He is obese.  HENT:      Head: Normocephalic and atraumatic.     Nose: Nose normal.  Eyes:     Extraocular Movements: Extraocular movements intact.     Conjunctiva/sclera: Conjunctivae normal.     Pupils: Pupils are equal, round, and reactive to light.  Cardiovascular:     Rate and Rhythm: Normal rate and regular rhythm.     Pulses: Normal pulses.  Heart sounds: Normal heart sounds.  Pulmonary:     Effort: Pulmonary effort is normal.     Breath sounds: Normal breath sounds.  Abdominal:     General: Abdomen is flat. Bowel sounds are normal.     Palpations: Abdomen is soft.  Musculoskeletal:     Cervical back: Normal range of motion and neck supple.  Neurological:     General: No focal deficit present.     Mental Status: He is alert and oriented to person, place, and time.  Psychiatric:        Mood and Affect: Mood normal.        Behavior: Behavior normal.    BP (!) 152/89   Pulse 77   Resp 16   Wt (!) 333 lb (151 kg)   SpO2 98%   BMI 46.44 kg/m  Wt Readings from Last 3 Encounters:  10/03/20 (!) 333 lb (151 kg)  06/12/20 (!) 331 lb 6.4 oz (150.3 kg)  03/14/20 (!) 331 lb 6.4 oz (150.3 kg)     Health Maintenance Due  Topic Date Due   Zoster Vaccines- Shingrix (1 of 2) Never done   OPHTHALMOLOGY EXAM  10/29/2019   COVID-19 Vaccine (4 - Booster for Pfizer series) 06/12/2020    There are no preventive care reminders to display for this patient.  Lab Results  Component Value Date   TSH 3.450 07/12/2017   Lab Results  Component Value Date   WBC 6.1 11/29/2019   HGB 14.9 11/29/2019   HCT 44.8 11/29/2019   MCV 92 11/29/2019   PLT 207 11/29/2019   Lab Results  Component Value Date   NA 140 01/26/2020   K 4.2 01/26/2020   CO2 26 01/26/2020   GLUCOSE 114 (H) 01/26/2020   BUN 17 01/26/2020   CREATININE 1.07 01/26/2020   BILITOT 1.1 03/08/2019   ALKPHOS 101 03/08/2019   AST 17 03/08/2019   ALT 12 03/08/2019   PROT 7.2 03/08/2019   ALBUMIN 4.1 03/08/2019   CALCIUM 9.7  01/26/2020   ANIONGAP 12 01/26/2020   Lab Results  Component Value Date   CHOL 125 10/07/2018   Lab Results  Component Value Date   HDL 32 (L) 10/07/2018   Lab Results  Component Value Date   LDLCALC 71 10/07/2018   Lab Results  Component Value Date   TRIG 109 10/07/2018   Lab Results  Component Value Date   CHOLHDL 3.9 10/07/2018   Lab Results  Component Value Date   HGBA1C 6.2 06/12/2020      Assessment & Plan:   Problem List Items Addressed This Visit       Cardiovascular and Mediastinum   Hypertension    Not well controlled with patient has not been taking his hydralazine patient reeducated as to the proper use of his blood pressure medications will not make changes for now refills given       Relevant Medications   amLODipine (NORVASC) 10 MG tablet   atorvastatin (LIPITOR) 40 MG tablet   carvedilol (COREG) 25 MG tablet   chlorthalidone (HYGROTON) 25 MG tablet   hydrALAZINE (APRESOLINE) 50 MG tablet     Endocrine   Diabetes mellitus type 2 with retinopathy ([place])    Diabetes type 2 with retinopathy patient is stable at this time we will continue current medication profile       Relevant Medications   atorvastatin (LIPITOR) 40 MG tablet   dapagliflozin propanediol (FARXIGA) 5 MG TABS tablet   Dulaglutide (TRULICITY) 0.86  MG/0.5ML SOPN   insulin detemir (LEVEMIR) 100 UNIT/ML injection   metFORMIN (GLUCOPHAGE) 1000 MG tablet   Diabetic retinopathy ([place])    Patient has follow-up with his ophthalmologist       Relevant Medications   atorvastatin (LIPITOR) 40 MG tablet   dapagliflozin propanediol (FARXIGA) 5 MG TABS tablet   Dulaglutide (TRULICITY) 1.77 LT/9.0ZE SOPN   insulin detemir (LEVEMIR) 100 UNIT/ML injection   metFORMIN (GLUCOPHAGE) 1000 MG tablet     Other   BMI 45.0-49.9, adult (HCC)    Patient recommended to continue to follow a weight loss program as prescribed       Relevant Medications   dapagliflozin propanediol (FARXIGA) 5 MG  TABS tablet   Dulaglutide (TRULICITY) 0.92 ZR/0.0TM SOPN   insulin detemir (LEVEMIR) 100 UNIT/ML injection   metFORMIN (GLUCOPHAGE) 1000 MG tablet   Left hip pain - Primary    Unclear etiology will change naproxen to meloxicam 15 mg daily and plan to obtain imaging of the pelvis and left hip       Relevant Orders   DG HIP UNILAT WITH PELVIS 2-3 VIEWS LEFT   Other Visit Diagnoses     Essential hypertension       Relevant Medications   amLODipine (NORVASC) 10 MG tablet   atorvastatin (LIPITOR) 40 MG tablet   carvedilol (COREG) 25 MG tablet   chlorthalidone (HYGROTON) 25 MG tablet   hydrALAZINE (APRESOLINE) 50 MG tablet   Other Relevant Orders   Comprehensive metabolic panel   CBC with Differential/Platelet   Hyperlipidemia, unspecified hyperlipidemia type       Relevant Medications   amLODipine (NORVASC) 10 MG tablet   atorvastatin (LIPITOR) 40 MG tablet   carvedilol (COREG) 25 MG tablet   chlorthalidone (HYGROTON) 25 MG tablet   hydrALAZINE (APRESOLINE) 50 MG tablet   Other Relevant Orders   Lipid panel   Diabetes mellitus type 2 in obese (HCC)       Relevant Medications   atorvastatin (LIPITOR) 40 MG tablet   dapagliflozin propanediol (FARXIGA) 5 MG TABS tablet   Dulaglutide (TRULICITY) 2.26 JF/3.5KT SOPN   insulin detemir (LEVEMIR) 100 UNIT/ML injection   metFORMIN (GLUCOPHAGE) 1000 MG tablet   Other Relevant Orders   Comprehensive metabolic panel   Hemoglobin A1c       Meds ordered this encounter  Medications   meloxicam (MOBIC) 15 MG tablet    Sig: Take 1 tablet (15 mg total) by mouth daily.    Dispense:  30 tablet    Refill:  2   amLODipine (NORVASC) 10 MG tablet    Sig: Take 1 tablet (10 mg total) by mouth daily.    Dispense:  90 tablet    Refill:  2    This prescription was filled on 12/13/2019. Any refills authorized will be placed on file.   atorvastatin (LIPITOR) 40 MG tablet    Sig: Take 1 tablet (40 mg total) by mouth daily.    Dispense:  90  tablet    Refill:  3   carvedilol (COREG) 25 MG tablet    Sig: Take 1 tablet (25 mg total) by mouth 2 (two) times daily with a meal.    Dispense:  120 tablet    Refill:  2   chlorthalidone (HYGROTON) 25 MG tablet    Sig: Take 1 tablet (25 mg total) by mouth daily.    Dispense:  30 tablet    Refill:  2    This prescription was filled on 08/04/2020. Any refills  authorized will be placed on file.   dapagliflozin propanediol (FARXIGA) 5 MG TABS tablet    Sig: Take 1 tablet (5 mg total) by mouth daily.    Dispense:  30 tablet    Refill:  6    This prescription was filled on 03/28/2020. Any refills authorized will be placed on file.   Dulaglutide (TRULICITY) 4.10 VU/1.3HY SOPN    Sig: INJECT 0.75 INTO THE SKIN ONCE WEEKLY.    Dispense:  2 mL    Refill:  3    This prescription was filled on 05/12/2020. Any refills authorized will be placed on file.   hydrALAZINE (APRESOLINE) 50 MG tablet    Sig: Take 1 tablet (50 mg total) by mouth 3 (three) times daily.    Dispense:  90 tablet    Refill:  3   insulin detemir (LEVEMIR) 100 UNIT/ML injection    Sig: INJECT 40 UNITS SUBCUTANEOUSLY 2 TIMES DAILY    Dispense:  20 mL    Refill:  6    This prescription was filled on 05/29/2020. Any refills authorized will be placed on file.   metFORMIN (GLUCOPHAGE) 1000 MG tablet    Sig: Take 1 tablet (1,000 mg total) by mouth 2 (two) times daily with a meal.    Dispense:  120 tablet    Refill:  1    This prescription was filled on 01/07/2020. Any refills authorized will be placed on file.    Follow-up: Return in about 3 months (around 01/03/2021).    Asencion Noble, MD

## 2020-10-05 ENCOUNTER — Ambulatory Visit
Admission: RE | Admit: 2020-10-05 | Discharge: 2020-10-05 | Disposition: A | Discharge status: Home / Self Care | Payer: BC Managed Care – PPO | Source: Ambulatory Visit | Attending: Critical Care Medicine | Admitting: Critical Care Medicine

## 2020-10-05 DIAGNOSIS — M25552 Pain in left hip: Secondary | ICD-10-CM

## 2020-10-06 ENCOUNTER — Other Ambulatory Visit: Payer: Self-pay

## 2020-10-06 ENCOUNTER — Ambulatory Visit: Payer: BC Managed Care – PPO | Admitting: Podiatry

## 2020-10-06 DIAGNOSIS — L84 Corns and callosities: Secondary | ICD-10-CM | POA: Diagnosis not present

## 2020-10-06 DIAGNOSIS — E1151 Type 2 diabetes mellitus with diabetic peripheral angiopathy without gangrene: Secondary | ICD-10-CM

## 2020-10-06 DIAGNOSIS — E1169 Type 2 diabetes mellitus with other specified complication: Secondary | ICD-10-CM | POA: Diagnosis not present

## 2020-10-06 DIAGNOSIS — B351 Tinea unguium: Secondary | ICD-10-CM | POA: Diagnosis not present

## 2020-10-06 LAB — CBC WITH DIFFERENTIAL/PLATELET
Basophils Absolute: 0 10*3/uL (ref 0.0–0.2)
Basos: 1 %
EOS (ABSOLUTE): 0.1 10*3/uL (ref 0.0–0.4)
Eos: 1 %
Hematocrit: 44.3 % (ref 37.5–51.0)
Hemoglobin: 15.2 g/dL (ref 13.0–17.7)
Immature Grans (Abs): 0 10*3/uL (ref 0.0–0.1)
Immature Granulocytes: 0 %
Lymphocytes Absolute: 1.2 10*3/uL (ref 0.7–3.1)
Lymphs: 25 %
MCH: 30.2 pg (ref 26.6–33.0)
MCHC: 34.3 g/dL (ref 31.5–35.7)
MCV: 88 fL (ref 79–97)
Monocytes Absolute: 0.6 10*3/uL (ref 0.1–0.9)
Monocytes: 12 %
Neutrophils Absolute: 2.9 10*3/uL (ref 1.4–7.0)
Neutrophils: 61 %
Platelets: 186 10*3/uL (ref 150–450)
RBC: 5.04 x10E6/uL (ref 4.14–5.80)
RDW: 13.5 % (ref 11.6–15.4)
WBC: 4.9 10*3/uL (ref 3.4–10.8)

## 2020-10-06 LAB — COMPREHENSIVE METABOLIC PANEL
ALT: 23 IU/L (ref 0–44)
AST: 25 IU/L (ref 0–40)
Albumin/Globulin Ratio: 1.4 (ref 1.2–2.2)
Albumin: 4.2 g/dL (ref 3.8–4.9)
Alkaline Phosphatase: 104 IU/L (ref 44–121)
BUN/Creatinine Ratio: 12 (ref 9–20)
BUN: 15 mg/dL (ref 6–24)
Bilirubin Total: 1.1 mg/dL (ref 0.0–1.2)
CO2: 24 mmol/L (ref 20–29)
Calcium: 9.2 mg/dL (ref 8.7–10.2)
Chloride: 98 mmol/L (ref 96–106)
Creatinine, Ser: 1.21 mg/dL (ref 0.76–1.27)
Globulin, Total: 3 g/dL (ref 1.5–4.5)
Glucose: 143 mg/dL — ABNORMAL HIGH (ref 65–99)
Potassium: 3.7 mmol/L (ref 3.5–5.2)
Sodium: 138 mmol/L (ref 134–144)
Total Protein: 7.2 g/dL (ref 6.0–8.5)
eGFR: 70 mL/min/{1.73_m2} (ref >59)

## 2020-10-06 LAB — LIPID PANEL
Chol/HDL Ratio: 5.3 ratio — ABNORMAL HIGH (ref 0.0–5.0)
Cholesterol, Total: 212 mg/dL — ABNORMAL HIGH (ref 100–199)
HDL: 40 mg/dL (ref >39)
LDL Chol Calc (NIH): 139 mg/dL — ABNORMAL HIGH (ref 0–99)
Triglycerides: 184 mg/dL — ABNORMAL HIGH (ref 0–149)
VLDL Cholesterol Cal: 33 mg/dL (ref 5–40)

## 2020-10-06 LAB — HEMOGLOBIN A1C
Est. average glucose Bld gHb Est-mCnc: 146 mg/dL
Hgb A1c MFr Bld: 6.7 % — ABNORMAL HIGH (ref 4.8–5.6)

## 2020-10-06 NOTE — Progress Notes (Signed)
  Subjective:  Patient ID: Justin Houston, male    DOB: [birth year],  MRN: [id number]  Chief Complaint  Patient presents with   Nail Problem    Nail trim 1-5 bilateral   55 y.o. male presents with the above complaint. History confirmed with patient.   Objective:  Physical Exam: warm, good capillary refill, nail exam onychomycosis of the toenails, no trophic changes or ulcerative lesions. DP pulses palpable and protective sensation intact PT pulse non-palp Left Foot: normal exam, no swelling, tenderness, instability; ligaments intact, full range of motion of all ankle/foot joints. HPK lateral 5th met base Right Foot: normal exam, no swelling, tenderness, instability; ligaments intact, full range of motion of all ankle/foot joints   No images are attached to the encounter.  Assessment:   1. Onychomycosis of multiple toenails with type 2 diabetes mellitus and peripheral angiopathy ([place])   2. Diabetes mellitus type 2 with peripheral artery disease (HCC)   3. Callus    Plan:  Patient was evaluated and treated and all questions answered.  Onychomycosis, Diabetes and PAD -Patient is diabetic with a qualifying condition for at risk foot care.   Procedure: Nail Debridement Type of Debridement: manual, sharp debridement. Instrumentation: Nail nipper, rotary burr. Number of Nails: 10  Procedure: Paring of Lesion Rationale: painful hyperkeratotic lesion Type of Debridement: manual, sharp debridement. Instrumentation: 312 blade Number of Lesions: 1   Return in about 3 months (around 01/06/2021) for Diabetic Foot Care.

## 2020-10-10 ENCOUNTER — Telehealth: Payer: Self-pay

## 2020-10-10 NOTE — Telephone Encounter (Signed)
Patient was called and a voicemail was left informing patient to return phone call for lab results.   Letter will be mailed to patient.

## 2020-10-10 NOTE — Telephone Encounter (Signed)
-----   Message from [name], MD sent at 10/06/2020 10:23 AM EDT ----- Let pt know liver, kidney blood counts normal   A1C is < 7 which is great.   Cholesterol still high and recommend stay on current lipitor dose

## 2020-10-11 NOTE — Telephone Encounter (Signed)
Patient called in for [place] would like a call back please

## 2020-10-11 NOTE — Telephone Encounter (Signed)
Patient returning your call.

## 2020-11-14 ENCOUNTER — Ambulatory Visit: Payer: BC Managed Care – PPO | Admitting: Critical Care Medicine

## 2020-11-17 ENCOUNTER — Other Ambulatory Visit: Payer: Self-pay | Admitting: Critical Care Medicine

## 2020-11-17 DIAGNOSIS — I10 Essential (primary) hypertension: Secondary | ICD-10-CM

## 2020-11-28 ENCOUNTER — Other Ambulatory Visit: Payer: Self-pay | Admitting: Critical Care Medicine

## 2020-12-08 ENCOUNTER — Other Ambulatory Visit: Payer: Self-pay | Admitting: Critical Care Medicine

## 2020-12-08 DIAGNOSIS — I10 Essential (primary) hypertension: Secondary | ICD-10-CM

## 2020-12-09 NOTE — Telephone Encounter (Signed)
called pharmacy and was told to disregard- RF are available

## 2020-12-16 ENCOUNTER — Other Ambulatory Visit: Payer: Self-pay | Admitting: Critical Care Medicine

## 2020-12-16 DIAGNOSIS — E669 Obesity, unspecified: Secondary | ICD-10-CM

## 2020-12-16 DIAGNOSIS — E1169 Type 2 diabetes mellitus with other specified complication: Secondary | ICD-10-CM

## 2020-12-31 NOTE — Progress Notes (Signed)
Established Patient Office Visit  Subjective:  Patient ID: Justin Houston, male    DOB: 15-Mar-1966  Age: 55 y.o. MRN: [id number]  CC:  Chief Complaint  Patient presents with   Follow-up   Diabetes    HPI Justin Houston presents for primary care follow-up visit.  Note on arrival blood pressure was elevated at 153/83.  Patient has been compliant with all of his blood pressure medications Needs Flu, foot , eye which include amlodipine 10 mg daily, Coreg 25 mg twice daily, chlorthalidone 25 mg daily, hydralazine 50 mg 3 times daily.  Patient continues with his diabetic medications including Farxiga 5 mg daily, Trulicity 75 mg weekly, metformin 1000 mg twice daily, insulin Levemir 40 units twice daily.  Patient needs refills on all these medications at this time.  He states his foot and hip pain are markedly better on the meloxicam.  He has occasional left calf soreness.  Patient still works as a Pharmacist, hospital in [place]. There are no further complaints.  Past Medical History:  Diagnosis Date   Dermatitis    Diabetes mellitus    GERD (gastroesophageal reflux disease)    Gout    Hyperlipidemia    Hypertension    Lipoma of scalp    Mass of head, right postauricular SQ 7x4cm 09/29/2013   Obesity    Toenail fungus 08/31/2018    Past Surgical History:  Procedure Laterality Date   COLONOSCOPY WITH PROPOFOL N/A 12/01/2014   Procedure: COLONOSCOPY WITH PROPOFOL;  Surgeon: [name], MD;  Location: [facility];  Service: Endoscopy;  Laterality: N/A;   LIPOMA EXCISION N/A 01/28/2020   Procedure: EXCISION RIGHT SCALP LIPOMA;  Surgeon: Cindra Presume, MD;  Location: [facility];  Service: Plastics;  Laterality: N/A;   right arm ORIF      Family History  Problem Relation Age of Onset   Stroke Mother    Hypertension Mother    Diabetes Maternal Grandmother    Prostate cancer Maternal Grandfather    Diabetes Other     Social History   Socioeconomic History    Marital status: Single    Spouse name: n/a   Number of children: 0   Years of education: 18   Highest education level: Not on file  Occupational History   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  Lives alone.   No siblings.   Social Determinants of Health   Financial Resource Strain: Not on file  Food Insecurity: Not on file  Transportation Needs: Not on file  Physical Activity: Not on file  Stress: Not on file  Social Connections: Not on file  Intimate Partner Violence: Not on file    Outpatient Medications Prior to Visit  Medication Sig Dispense Refill   ALPHAGAN P 0.1 % SOLN Place 1 drop into both eyes 2 (two) times daily. 15 mL 1   bimatoprost (LUMIGAN) 0.03 % ophthalmic solution SMARTSIG:1 Drop(s) In Eye(s) Every Evening  Blood Glucose Monitoring Suppl (ONETOUCH VERIO) w/Device KIT Use as instructed to check blood sugar 3 times daily. E11.319 Z79.4 1 kit 0   clobetasol ointment (TEMOVATE) 3.00 % Apply 1 application topically 3 (three) times daily as needed (rash.). 60 g 1   STIMULANT LAXATIVE 8.6-50 MG tablet TAKE 1 TABLET BY MOUTH DAILY 30 tablet 1   amLODipine (NORVASC) 10 MG tablet Take 1 tablet (10 mg total) by mouth daily. 90 tablet 2   atorvastatin (LIPITOR) 40 MG tablet Take 1 tablet (40 mg total) by mouth daily. 90 tablet 3   BD INSULIN SYRINGE U/F 31G X 5/16" 1 ML MISC USE TO INJECT INSULIN 2 TIMES DAILY AS DIRECTED 100 each 11   carvedilol (COREG) 25 MG tablet Take 1 tablet (25 mg total) by mouth 2 (two) times daily with a  meal. 120 tablet 2   chlorthalidone (HYGROTON) 25 MG tablet TAKE 1 TABLET BY MOUTH ONCE DAILY 30 tablet 0   dapagliflozin propanediol (FARXIGA) 5 MG TABS tablet Take 1 tablet (5 mg total) by mouth daily. 30 tablet 6   Dulaglutide (TRULICITY) 9.23 RA/0.7MA SOPN INJECT 0.75 INTO THE SKIN ONCE WEEKLY. 2 mL 3   glucose blood (ONETOUCH VERIO) test strip USE AS INSTRUCTED TO CHECK BLOOD SUGAR 3 TIMES DAILY 100 strip 11   hydrALAZINE (APRESOLINE) 50 MG tablet Take 1 tablet (50 mg total) by mouth 3 (three) times daily. 90 tablet 3   insulin detemir (LEVEMIR) 100 UNIT/ML injection INJECT 40 UNITS SUBCUTANEOUSLY 2 TIMES DAILY 20 mL 6   meloxicam (MOBIC) 15 MG tablet TAKE 1 TABLET BY MOUTH EVERY DAY 30 tablet 2   metFORMIN (GLUCOPHAGE) 1000 MG tablet TAKE 1 TABLET BY MOUTH 2 TIMES DAILY WITH A MEAL 120 tablet 1   OneTouch Delica Lancets 26J MISC Use as instructed to check blood sugar 3 times daily. E11.319 Z79.4 100 each 11   colchicine 0.6 MG tablet Take 1 tablet (0.6 mg total) by mouth 2 (two) times daily as needed. (Patient not taking: No sig reported) 20 tablet 1   cyclobenzaprine (FLEXERIL) 10 MG tablet Take 1 tablet by mouth 2 (two) times daily as needed. (Patient not taking: Reported on 01/01/2021)     Insulin Pen Needle 30G X 5 MM MISC 1 Stick by Does not apply route daily. (Patient not taking: No sig reported) 100 each 11   latanoprost (XALATAN) 0.005 % ophthalmic solution SMARTSIG:In Eye(s)     LUMIGAN 0.01 % SOLN SMARTSIG:1 Drop(s) In Eye(s) Every Evening     omeprazole (PRILOSEC) 20 MG capsule Take 20 mg by mouth daily.     No facility-administered medications prior to visit.    Allergies  Allergen Reactions   Losartan Potassium-Hctz Hives   Cozaar [Losartan Potassium] Hives    Hives     ROS Review of Systems  Constitutional:  Negative for chills, diaphoresis and fever.  HENT:  Negative for congestion, hearing loss, nosebleeds, sore throat and tinnitus.   Eyes:  Negative for  photophobia and redness.  Respiratory:  Negative for cough, shortness of breath, wheezing and stridor.   Cardiovascular:  Negative for chest pain, palpitations and leg swelling.  Gastrointestinal:  Negative for abdominal pain, blood in stool, constipation, diarrhea, nausea and vomiting.  Endocrine: Negative for polydipsia.  Genitourinary:  Negative for dysuria, flank pain, frequency, hematuria and urgency.  Musculoskeletal:  Negative for back pain, myalgias and neck pain.  Skin:  Negative for rash.  Allergic/Immunologic: Negative for environmental allergies.  Neurological:  Negative  for dizziness, tremors, seizures, weakness and headaches.  Hematological:  Does not bruise/bleed easily.  Psychiatric/Behavioral:  Negative for suicidal ideas. The patient is not nervous/anxious.      Objective:    Physical Exam Vitals reviewed.  Constitutional:      Appearance: Normal appearance. He is well-developed. He is obese. He is not diaphoretic.  HENT:     Head: Normocephalic and atraumatic.     Nose: No nasal deformity, septal deviation, mucosal edema or rhinorrhea.     Right Sinus: No maxillary sinus tenderness or frontal sinus tenderness.     Left Sinus: No maxillary sinus tenderness or frontal sinus tenderness.     Mouth/Throat:     Pharynx: No oropharyngeal exudate.  Eyes:     General: No scleral icterus.    Conjunctiva/sclera: Conjunctivae normal.     Pupils: Pupils are equal, round, and reactive to light.  Neck:     Thyroid: No thyromegaly.     Vascular: No carotid bruit or JVD.     Trachea: Trachea normal. No tracheal tenderness or tracheal deviation.  Cardiovascular:     Rate and Rhythm: Normal rate and regular rhythm.     Chest Wall: PMI is not displaced.     Pulses: Normal pulses. No decreased pulses.     Heart sounds: Normal heart sounds, S1 normal and S2 normal. Heart sounds not distant. No murmur heard. No systolic murmur is present.  No diastolic murmur is present.    No  friction rub. No gallop. No S3 or S4 sounds.  Pulmonary:     Effort: Pulmonary effort is normal. No tachypnea, accessory muscle usage or respiratory distress.     Breath sounds: Normal breath sounds. No stridor. No decreased breath sounds, wheezing, rhonchi or rales.  Chest:     Chest wall: No tenderness.  Abdominal:     General: Bowel sounds are normal. There is no distension.     Palpations: Abdomen is soft. Abdomen is not rigid.     Tenderness: There is no abdominal tenderness. There is no guarding or rebound.  Musculoskeletal:        General: Normal range of motion.     Cervical back: Normal range of motion and neck supple. No edema, erythema or rigidity. No muscular tenderness. Normal range of motion.     Comments: Foot exam normal  Lymphadenopathy:     Head:     Right side of head: No submental or submandibular adenopathy.     Left side of head: No submental or submandibular adenopathy.     Cervical: No cervical adenopathy.  Skin:    General: Skin is warm and dry.     Coloration: Skin is not pale.     Findings: No rash.     Nails: There is no clubbing.  Neurological:     General: No focal deficit present.     Mental Status: He is alert and oriented to person, place, and time. Mental status is at baseline.     Sensory: No sensory deficit.  Psychiatric:        Mood and Affect: Mood normal.        Speech: Speech normal.        Behavior: Behavior normal.    BP (!) 153/83   Pulse 76   Resp 16   Wt (!) 330 lb 3.2 oz (149.8 kg)   SpO2 96%   BMI 46.05 kg/m  Wt Readings from Last 3 Encounters:  01/01/21 (!) 330 lb 3.2  oz (149.8 kg)  10/03/20 (!) 333 lb (151 kg)  06/12/20 (!) 331 lb 6.4 oz (150.3 kg)     There are no preventive care reminders to display for this patient.   There are no preventive care reminders to display for this patient.  Lab Results  Component Value Date   TSH 3.450 07/12/2017   Lab Results  Component Value Date   WBC 4.9 10/05/2020   HGB  15.2 10/05/2020   HCT 44.3 10/05/2020   MCV 88 10/05/2020   PLT 186 10/05/2020   Lab Results  Component Value Date   NA 138 10/05/2020   K 3.7 10/05/2020   CO2 24 10/05/2020   GLUCOSE 143 (H) 10/05/2020   BUN 15 10/05/2020   CREATININE 1.21 10/05/2020   BILITOT 1.1 10/05/2020   ALKPHOS 104 10/05/2020   AST 25 10/05/2020   ALT 23 10/05/2020   PROT 7.2 10/05/2020   ALBUMIN 4.2 10/05/2020   CALCIUM 9.2 10/05/2020   ANIONGAP 12 01/26/2020   EGFR 70 10/05/2020   Lab Results  Component Value Date   CHOL 212 (H) 10/05/2020   Lab Results  Component Value Date   HDL 40 10/05/2020   Lab Results  Component Value Date   LDLCALC 139 (H) 10/05/2020   Lab Results  Component Value Date   TRIG 184 (H) 10/05/2020   Lab Results  Component Value Date   CHOLHDL 5.3 (H) 10/05/2020   Lab Results  Component Value Date   HGBA1C 6.7 (H) 10/05/2020      Assessment & Plan:   Problem List Items Addressed This Visit       Cardiovascular and Mediastinum   Hypertension    Blood pressure not at goal increase hydralazine to 100 mg 3 times daily      Relevant Medications   amLODipine (NORVASC) 10 MG tablet   atorvastatin (LIPITOR) 40 MG tablet   carvedilol (COREG) 25 MG tablet   chlorthalidone (HYGROTON) 25 MG tablet   hydrALAZINE (APRESOLINE) 100 MG tablet     Endocrine   Diabetes mellitus type 2 with retinopathy ([place]) - Primary    Has regular follow-up with ophthalmology eyedrops refilled      Relevant Medications   atorvastatin (LIPITOR) 40 MG tablet   dapagliflozin propanediol (FARXIGA) 5 MG TABS tablet   Dulaglutide (TRULICITY) 6.15 XI/2.8BB SOPN   insulin detemir (LEVEMIR) 100 UNIT/ML injection   metFORMIN (GLUCOPHAGE) 1000 MG tablet   OneTouch Delica Lancets 23T MISC   glucose blood (ONETOUCH VERIO) test strip   Other Relevant Orders   POCT glucose (manual entry) (Completed)   Hyperlipidemia associated with type 2 diabetes mellitus (HCC)    Continue current  medications      Relevant Medications   atorvastatin (LIPITOR) 40 MG tablet   dapagliflozin propanediol (FARXIGA) 5 MG TABS tablet   Dulaglutide (TRULICITY) 4.86 OD/9.4HM SOPN   insulin detemir (LEVEMIR) 100 UNIT/ML injection   metFORMIN (GLUCOPHAGE) 1000 MG tablet     Other   BMI 45.0-49.9, adult (HCC)    Weight is down to 330 pounds encouraged continued weight loss      Relevant Medications   dapagliflozin propanediol (FARXIGA) 5 MG TABS tablet   Dulaglutide (TRULICITY) 6.54 FA/1.3IL SOPN   insulin detemir (LEVEMIR) 100 UNIT/ML injection   metFORMIN (GLUCOPHAGE) 1000 MG tablet   Left hip pain    Improved with meloxicam continue same      Other Visit Diagnoses     Need for immunization against influenza  Relevant Orders   Flu Vaccine QUAD 10mo+IM (Fluarix, Fluzone & Alfiuria Quad PF) (Completed)   Essential hypertension       Relevant Medications   amLODipine (NORVASC) 10 MG tablet   atorvastatin (LIPITOR) 40 MG tablet   carvedilol (COREG) 25 MG tablet   chlorthalidone (HYGROTON) 25 MG tablet   hydrALAZINE (APRESOLINE) 100 MG tablet   Hyperlipidemia, unspecified hyperlipidemia type       Relevant Medications   amLODipine (NORVASC) 10 MG tablet   atorvastatin (LIPITOR) 40 MG tablet   carvedilol (COREG) 25 MG tablet   chlorthalidone (HYGROTON) 25 MG tablet   hydrALAZINE (APRESOLINE) 100 MG tablet   Diabetes mellitus type 2 in obese (HCC)       Relevant Medications   atorvastatin (LIPITOR) 40 MG tablet   dapagliflozin propanediol (FARXIGA) 5 MG TABS tablet   Dulaglutide (TRULICITY) 8.34 HD/6.2IW SOPN   insulin detemir (LEVEMIR) 100 UNIT/ML injection   metFORMIN (GLUCOPHAGE) 1000 MG tablet   Elevated PSA           Meds ordered this encounter  Medications   amLODipine (NORVASC) 10 MG tablet    Sig: Take 1 tablet (10 mg total) by mouth daily.    Dispense:  90 tablet    Refill:  2   atorvastatin (LIPITOR) 40 MG tablet    Sig: Take 1 tablet (40 mg total) by  mouth daily.    Dispense:  90 tablet    Refill:  3   carvedilol (COREG) 25 MG tablet    Sig: Take 1 tablet (25 mg total) by mouth 2 (two) times daily with a meal.    Dispense:  120 tablet    Refill:  2   chlorthalidone (HYGROTON) 25 MG tablet    Sig: Take 1 tablet (25 mg total) by mouth daily.    Dispense:  30 tablet    Refill:  0   dapagliflozin propanediol (FARXIGA) 5 MG TABS tablet    Sig: Take 1 tablet (5 mg total) by mouth daily.    Dispense:  30 tablet    Refill:  6   Dulaglutide (TRULICITY) 9.79 GX/2.1JH SOPN    Sig: INJECT 0.75 INTO THE SKIN ONCE WEEKLY.    Dispense:  2 mL    Refill:  3   hydrALAZINE (APRESOLINE) 100 MG tablet    Sig: Take 1 tablet (100 mg total) by mouth 3 (three) times daily.    Dispense:  90 tablet    Refill:  4   insulin detemir (LEVEMIR) 100 UNIT/ML injection    Sig: INJECT 40 UNITS SUBCUTANEOUSLY 2 TIMES DAILY    Dispense:  20 mL    Refill:  6   meloxicam (MOBIC) 15 MG tablet    Sig: Take 1 tablet (15 mg total) by mouth daily.    Dispense:  30 tablet    Refill:  2   omeprazole (PRILOSEC) 20 MG capsule    Sig: Take 1 capsule (20 mg total) by mouth daily.    Dispense:  60 capsule    Refill:  3   metFORMIN (GLUCOPHAGE) 1000 MG tablet    Sig: Take 1 tablet (1,000 mg total) by mouth 2 (two) times daily with a meal.    Dispense:  120 tablet    Refill:  1    This prescription was filled on 12/16/2020. Any refills authorized will be placed on file.   OneTouch Delica Lancets 41D MISC    Sig: Use as instructed to check blood sugar 3  times daily. E11.319 Z79.4    Dispense:  100 each    Refill:  11   glucose blood (ONETOUCH VERIO) test strip    Sig: USE AS INSTRUCTED TO CHECK BLOOD SUGAR 3 TIMES DAILY    Dispense:  100 strip    Refill:  11   Insulin Syringe-Needle U-100 (BD INSULIN SYRINGE U/F) 31G X 5/16" 1 ML MISC    Sig: USE TO INJECT INSULIN 2 TIMES DAILY AS DIRECTED    Dispense:  100 each    Refill:  11   Flu vaccine and shingles vaccine was  given Follow-up: Return in about 5 months (around 06/03/2021).    Asencion Noble, MD

## 2021-01-01 ENCOUNTER — Ambulatory Visit: Payer: BC Managed Care – PPO | Attending: Critical Care Medicine | Admitting: Critical Care Medicine

## 2021-01-01 ENCOUNTER — Encounter: Payer: Self-pay | Admitting: Critical Care Medicine

## 2021-01-01 ENCOUNTER — Other Ambulatory Visit: Payer: Self-pay

## 2021-01-01 VITALS — BP 153/83 | HR 76 | Resp 16 | Wt 330.2 lb

## 2021-01-01 DIAGNOSIS — Z794 Long term (current) use of insulin: Secondary | ICD-10-CM

## 2021-01-01 DIAGNOSIS — E1169 Type 2 diabetes mellitus with other specified complication: Secondary | ICD-10-CM | POA: Diagnosis not present

## 2021-01-01 DIAGNOSIS — Z23 Encounter for immunization: Secondary | ICD-10-CM

## 2021-01-01 DIAGNOSIS — E785 Hyperlipidemia, unspecified: Secondary | ICD-10-CM

## 2021-01-01 DIAGNOSIS — E669 Obesity, unspecified: Secondary | ICD-10-CM

## 2021-01-01 DIAGNOSIS — I10 Essential (primary) hypertension: Secondary | ICD-10-CM | POA: Diagnosis not present

## 2021-01-01 DIAGNOSIS — M25552 Pain in left hip: Secondary | ICD-10-CM

## 2021-01-01 DIAGNOSIS — E11319 Type 2 diabetes mellitus with unspecified diabetic retinopathy without macular edema: Secondary | ICD-10-CM | POA: Diagnosis not present

## 2021-01-01 DIAGNOSIS — R972 Elevated prostate specific antigen [PSA]: Secondary | ICD-10-CM

## 2021-01-01 DIAGNOSIS — Z6841 Body Mass Index (BMI) 40.0 and over, adult: Secondary | ICD-10-CM

## 2021-01-01 LAB — GLUCOSE, POCT (MANUAL RESULT ENTRY): POC Glucose: 140 mg/dl — AB (ref 70–99)

## 2021-01-01 MED ORDER — ONETOUCH DELICA LANCETS 33G MISC: 11 refills | Status: DC

## 2021-01-01 MED ORDER — DAPAGLIFLOZIN PROPANEDIOL 5 MG PO TABS: 5.0000 mg | ORAL_TABLET | Freq: Every day | ORAL | 6 refills | Status: DC

## 2021-01-01 MED ORDER — "INSULIN SYRINGE-NEEDLE U-100 31G X 5/16"" 1 ML MISC": 11 refills | Status: DC

## 2021-01-01 MED ORDER — ATORVASTATIN CALCIUM 40 MG PO TABS: 40.0000 mg | ORAL_TABLET | Freq: Every day | ORAL | 3 refills | Status: DC

## 2021-01-01 MED ORDER — OMEPRAZOLE 20 MG PO CPDR: 20.0000 mg | DELAYED_RELEASE_CAPSULE | Freq: Every day | ORAL | 3 refills | Status: DC

## 2021-01-01 MED ORDER — TRULICITY 0.75 MG/0.5ML ~~LOC~~ SOAJ: SUBCUTANEOUS | 3 refills | Status: DC

## 2021-01-01 MED ORDER — INSULIN DETEMIR 100 UNIT/ML ~~LOC~~ SOLN: SUBCUTANEOUS | 6 refills | Status: DC

## 2021-01-01 MED ORDER — CHLORTHALIDONE 25 MG PO TABS: 25.0000 mg | ORAL_TABLET | Freq: Every day | ORAL | 0 refills | Status: DC

## 2021-01-01 MED ORDER — AMLODIPINE BESYLATE 10 MG PO TABS: 10.0000 mg | ORAL_TABLET | Freq: Every day | ORAL | 2 refills | Status: DC

## 2021-01-01 MED ORDER — MELOXICAM 15 MG PO TABS: 15.0000 mg | ORAL_TABLET | Freq: Every day | ORAL | 2 refills | Status: DC

## 2021-01-01 MED ORDER — CARVEDILOL 25 MG PO TABS: 25.0000 mg | ORAL_TABLET | Freq: Two times a day (BID) | ORAL | 2 refills | Status: DC

## 2021-01-01 MED ORDER — METFORMIN HCL 1000 MG PO TABS: 1000.0000 mg | ORAL_TABLET | Freq: Two times a day (BID) | ORAL | 1 refills | Status: DC

## 2021-01-01 MED ORDER — HYDRALAZINE HCL 100 MG PO TABS: 100.0000 mg | ORAL_TABLET | Freq: Three times a day (TID) | ORAL | 4 refills | Status: DC

## 2021-01-01 MED ORDER — ONETOUCH VERIO VI STRP: ORAL_STRIP | 11 refills | Status: DC

## 2021-01-01 NOTE — Patient Instructions (Addendum)
No change in medications EXCEPT INCREASE HYDRALAZINE TO 100MG  THREE TIMES A DAY.   [facility] on all [facility]  Shingles vaccine and flu vaccine was given  Keep your upcoming foot exam appointments  Return to see Dr. Joya Gaskins 5 months

## 2021-01-01 NOTE — Assessment & Plan Note (Signed)
Has regular follow-up with ophthalmology eyedrops refilled

## 2021-01-01 NOTE — Assessment & Plan Note (Signed)
Blood pressure not at goal increase hydralazine to 100 mg 3 times daily

## 2021-01-01 NOTE — Assessment & Plan Note (Signed)
Improved with meloxicam continue same

## 2021-01-01 NOTE — Assessment & Plan Note (Signed)
Continue current medications. 

## 2021-01-01 NOTE — Assessment & Plan Note (Signed)
Weight is down to 330 pounds encouraged continued weight loss

## 2021-01-09 ENCOUNTER — Ambulatory Visit: Payer: BC Managed Care – PPO | Admitting: Podiatry

## 2021-01-12 ENCOUNTER — Ambulatory Visit: Payer: BC Managed Care – PPO | Admitting: Podiatry

## 2021-01-12 ENCOUNTER — Other Ambulatory Visit: Payer: Self-pay

## 2021-01-12 DIAGNOSIS — L84 Corns and callosities: Secondary | ICD-10-CM | POA: Diagnosis not present

## 2021-01-12 DIAGNOSIS — E1151 Type 2 diabetes mellitus with diabetic peripheral angiopathy without gangrene: Secondary | ICD-10-CM

## 2021-01-12 DIAGNOSIS — E1169 Type 2 diabetes mellitus with other specified complication: Secondary | ICD-10-CM

## 2021-01-12 DIAGNOSIS — B351 Tinea unguium: Secondary | ICD-10-CM | POA: Diagnosis not present

## 2021-01-19 ENCOUNTER — Ambulatory Visit: Payer: Self-pay

## 2021-01-19 NOTE — Telephone Encounter (Signed)
Patient called and says he was tested positive for COVID on 01/18/21 and that he has sinus congestion, seeking advice on what he should do. He says the symptoms started on Wednesday (fatigue, sneezing, cough) and on Thursday he tested at work. He says he is no longer feeling fatigued, still has sinus congestion, coughs a wet cough non-productive sometimes and sneezes sometimes. Denies SOB or any other symptoms. MyChart video visit scheduled for Tuesday, 01/23/21 at 0910 with Geryl Rankins, NP, care advice given, patient verbalized understanding. He says he would rather have a telephone visit instead of MyChart video, because he's not sure how to work it. I advised I will note it in the appointment note.   Summary: congestion/covid   Patient diagnosed with covid 10/06/has some congestion,  , wants to know what he can take for it.      Reason for Disposition  [1] COVID-19 diagnosed by positive lab test (e.g., PCR, rapid self-test kit) AND [2] mild symptoms (e.g., cough, fever, others) AND [8] no complications or SOB  Answer Assessment - Initial Assessment Questions 1. COVID-19 DIAGNOSIS: "Who made your COVID-19 diagnosis?" "Was it confirmed by a positive lab test or self-test?" If not diagnosed by a doctor (or NP/PA), ask "Are there lots of cases (community spread) where you live?" Note: See public health department website, if unsure.     Test at work yesterday positive 2. COVID-19 EXPOSURE: "Was there any known exposure to COVID before the symptoms began?" CDC Definition of close contact: within 6 feet (2 meters) for a total of 15 minutes or more over a 24-hour period.      Unknown 3. ONSET: "When did the COVID-19 symptoms start?"      01/17/21 4. WORST SYMPTOM: "What is your worst symptom?" (e.g., cough, fever, shortness of breath, muscle aches)     Nasal congestion, sneezing 5. COUGH: "Do you have a cough?" If Yes, ask: "How bad is the cough?"       Yes, not that bad, sometimes cough (wet cough,  non-productive) 6. FEVER: "Do you have a fever?" If Yes, ask: "What is your temperature, how was it measured, and when did it start?"     No 7. RESPIRATORY STATUS: "Describe your breathing?" (e.g., shortness of breath, wheezing, unable to speak)      No 8. BETTER-SAME-WORSE: "Are you getting better, staying the same or getting worse compared to yesterday?"  If getting worse, ask, "In what way?"     Getting better 9. HIGH RISK DISEASE: "Do you have any chronic medical problems?" (e.g., asthma, heart or lung disease, weak immune system, obesity, etc.)     Diabetes 10. VACCINE: "Have you had the COVID-19 vaccine?" If Yes, ask: "Which one, how many shots, when did you get it?"       Yes 2 shots Pfizer 11. BOOSTER: "Have you received your COVID-19 booster?" If Yes, ask: "Which one and when did you get it?"       Yes 4 boosters Pfizer 12. PREGNANCY: "Is there any chance you are pregnant?" "When was your last menstrual period?"       N/A 13. OTHER SYMPTOMS: "Do you have any other symptoms?"  (e.g., chills, fatigue, headache, loss of smell or taste, muscle pain, sore throat)       Fatigue on Wednesday, sneezing sometimes, cough sometimes 14. O2 SATURATION MONITOR:  "Do you use an oxygen saturation monitor (pulse oximeter) at home?" If Yes, ask "What is your reading (oxygen level) today?" "What is your  usual oxygen saturation reading?" (e.g., 95%)       No  Protocols used: Coronavirus (COVID-19) Diagnosed or Suspected-A-AH

## 2021-01-19 NOTE — Progress Notes (Signed)
  Subjective:  Patient ID: Justin Houston, male    DOB: [birth year],  MRN: [id number]  Chief Complaint  Patient presents with   Nail Problem    Nail trim 1-5 bilateral   Callouses    Left foot lateral aspect callous trim   55 y.o. male presents with the above complaint. History confirmed with patient.   Objective:  Physical Exam: warm, good capillary refill, nail exam onychomycosis of the toenails, no trophic changes or ulcerative lesions. DP pulses palpable and protective sensation intact PT pulse non-palp Left Foot: normal exam, no swelling, tenderness, instability; ligaments intact, full range of motion of all ankle/foot joints. HPK lateral 5th met base Right Foot: normal exam, no swelling, tenderness, instability; ligaments intact, full range of motion of all ankle/foot joints   No images are attached to the encounter.  Assessment:   1. Onychomycosis of multiple toenails with type 2 diabetes mellitus and peripheral angiopathy (HCC)   2. Callus     Plan:  Patient was evaluated and treated and all questions answered.  Onychomycosis, Diabetes and PAD -Patient is diabetic with a qualifying condition for at risk foot care.   Procedure: Nail Debridement Type of Debridement: manual, sharp debridement. Instrumentation: Nail nipper, rotary burr. Number of Nails: 10   Procedure: Paring of Lesion Rationale: painful hyperkeratotic lesion Type of Debridement: manual, sharp debridement. Instrumentation: 312 blade Number of Lesions: 1   Return in about 3 months (around 04/13/2021).

## 2021-01-23 ENCOUNTER — Other Ambulatory Visit: Payer: Self-pay

## 2021-01-23 ENCOUNTER — Ambulatory Visit: Payer: BC Managed Care – PPO | Attending: Nurse Practitioner | Admitting: Nurse Practitioner

## 2021-01-23 ENCOUNTER — Encounter: Payer: Self-pay | Admitting: Nurse Practitioner

## 2021-01-23 DIAGNOSIS — U071 COVID-19: Secondary | ICD-10-CM | POA: Diagnosis not present

## 2021-01-23 MED ORDER — MOLNUPIRAVIR EUA 200MG CAPSULE: 4.0000 | ORAL_CAPSULE | Freq: Two times a day (BID) | ORAL | 0 refills | Status: AC

## 2021-01-23 NOTE — Progress Notes (Signed)
Virtual Visit via Telephone Note Due to national recommendations of social distancing due to [place] 19, telehealth visit is felt to be most appropriate for this patient at this time.  I discussed the limitations, risks, security and privacy concerns of performing an evaluation and management service by telephone and the availability of in person appointments. I also discussed with the patient that there may be a patient responsible charge related to this service. The patient expressed understanding and agreed to proceed.    I connected with Justin Houston on 01/23/21  at   9:10 AM EDT  EDT by telephone and verified that I am speaking with the correct person using two identifiers.  Location of Patient: Private Residence   Location of Provider: [place] [facility] [place] participating in Telemedicine visit: Geryl Rankins FNP-BC GARLAN DREWES    History of Present Illness: Telemedicine visit for: COVID 19 symptoms  Tested positive last Friday 01-19-2021. Associated symptoms: headache, congestion and sore throat. Denies N/VD or fever.  Requesting antiviral today for relief of symptoms.     Past Medical History:  Diagnosis Date   Dermatitis    Diabetes mellitus    GERD (gastroesophageal reflux disease)    Gout    Hyperlipidemia    Hypertension    Lipoma of scalp    Mass of head, right postauricular SQ 7x4cm 09/29/2013   Obesity    Toenail fungus 08/31/2018    Past Surgical History:  Procedure Laterality Date   COLONOSCOPY WITH PROPOFOL N/A 12/01/2014   Procedure: COLONOSCOPY WITH PROPOFOL;  Surgeon: [name], MD;  [facility];  Service: Endoscopy;  Laterality: N/A;   LIPOMA EXCISION N/A 01/28/2020   Procedure: EXCISION RIGHT SCALP LIPOMA;  Surgeon: Cindra Presume, MD;  Location: [facility];  Service: Plastics;  Laterality: N/A;   right arm ORIF      Family History  Problem Relation Age of Onset   Stroke Mother     Hypertension Mother    Diabetes Maternal Grandmother    Prostate cancer Maternal Grandfather    Diabetes Other     Social History   Socioeconomic History   Marital status: Single    Spouse name: n/a   Number of children: 0   Years of education: 18   Highest education level: Not on file  Occupational History   Occupation: EC Financial planner: [employer]  Tobacco Use   Smoking status: Former    Types: Cigarettes    Quit date: 09/05/1994    Years since quitting: 26.4   Smokeless tobacco: Never  Vaping Use   Vaping Use: Never used  Substance and Sexual Activity   Alcohol use: No    Alcohol/week: 0.0 standard drinks   Drug use: No   Sexual activity: Never  Other Topics Concern   Not on file  Social History Narrative   Master's Degree in Adult Education.  Lives alone.   No siblings.   Social Determinants of Health   Financial Resource Strain: Not on file  Food Insecurity: Not on file  Transportation Needs: Not on file  Physical Activity: Not on file  Stress: Not on file  Social Connections: Not on file     Observations/Objective: Awake, alert and oriented x 3   ROS  Assessment and Plan: Diagnoses and all orders for this visit:  COVID-19 -     molnupiravir EUA (LAGEVRIO) 200 mg CAPS capsule; Take 4 capsules (  800 mg total) by mouth 2 (two) times daily for 5 days.  INSTRUCTIONS: use a humidifier for nasal congestion Drink plenty of fluids, rest and wash hands frequently to avoid the spread of infection Alternate tylenol and Motrin for relief of fever   Follow Up Instructions Return if symptoms worsen or fail to improve.     I discussed the assessment and treatment plan with the patient. The patient was provided an opportunity to ask questions and all were answered. The patient agreed with the plan and demonstrated an understanding of the instructions.   The patient was advised to call back or seek an in-person evaluation if the  symptoms worsen or if the condition fails to improve as anticipated.  I provided 8 minutes of non-face-to-face time during this encounter including median intraservice time, reviewing previous notes, labs, imaging, medications and explaining diagnosis and management.  Justin Pounds, FNP-BC

## 2021-01-27 ENCOUNTER — Other Ambulatory Visit: Payer: Self-pay | Admitting: Critical Care Medicine

## 2021-01-27 DIAGNOSIS — E669 Obesity, unspecified: Secondary | ICD-10-CM

## 2021-01-27 DIAGNOSIS — E1169 Type 2 diabetes mellitus with other specified complication: Secondary | ICD-10-CM

## 2021-01-27 NOTE — Telephone Encounter (Signed)
last RF 01/01/21 #30 6 RF

## 2021-02-28 ENCOUNTER — Other Ambulatory Visit: Payer: Self-pay | Admitting: Critical Care Medicine

## 2021-02-28 DIAGNOSIS — E1169 Type 2 diabetes mellitus with other specified complication: Secondary | ICD-10-CM

## 2021-02-28 NOTE — Telephone Encounter (Signed)
Rx refilled 01/01/2021 #120 with 1 refill. Pt should have enough medication to last until 04/2021

## 2021-03-01 NOTE — Telephone Encounter (Signed)
Last ordered 01/01/21 #120 tabs with 1 refill. Verified with pharmacist the patient picked up last 90 day refill yesterday. He should have enough medication to last until January. This request is too early per protocol-refused.

## 2021-03-03 ENCOUNTER — Other Ambulatory Visit: Payer: Self-pay | Admitting: Critical Care Medicine

## 2021-03-03 DIAGNOSIS — E1169 Type 2 diabetes mellitus with other specified complication: Secondary | ICD-10-CM

## 2021-03-03 DIAGNOSIS — E669 Obesity, unspecified: Secondary | ICD-10-CM

## 2021-03-04 NOTE — Telephone Encounter (Signed)
Requested med (Metformin) was refused refill (too soon). [facility] but it was closed. Need to call and discuss with pharmacist to refuse med.

## 2021-03-05 NOTE — Telephone Encounter (Signed)
Call to pharmacy- verified picked rx up 3 days ago- RF on file if needed. Requested Prescriptions  Pending Prescriptions Disp Refills  . metFORMIN (GLUCOPHAGE) 1000 MG tablet [Pharmacy Med Name: metformin 1,000 mg tablet] 120 tablet 1    Sig: TAKE 1 TABLET BY MOUTH 2 TIMES DAILY WITH A MEAL     Endocrinology:  Diabetes - Biguanides Failed - 03/03/2021  2:37 PM      Failed - AA eGFR in normal range and within 360 days    GFR calc Af Amer  Date Value Ref Range Status  11/09/2019 75 >59 mL/min/1.73 Final    Comment:    **[facility] currently reports eGFR in compliance with the current**   recommendations of the [employer]. [facility] will   update reporting as new guidelines are published from the NKF-ASN   Task force.    GFR, Estimated  Date Value Ref Range Status  01/26/2020 >60 >60 mL/min Final   eGFR  Date Value Ref Range Status  10/05/2020 70 >59 mL/min/1.73 Final         Passed - Cr in normal range and within 360 days    Creat  Date Value Ref Range Status  01/16/2016 0.95 0.70 - 1.33 mg/dL Final    Comment:      For patients > or = 56 years of age: The upper reference limit for Creatinine is approximately 13% higher for people identified as African-American.      Creatinine, Ser  Date Value Ref Range Status  10/05/2020 1.21 0.76 - 1.27 mg/dL Final   Creatinine, Urine  Date Value Ref Range Status  08/12/2010 201.3 mg/dL Final         Passed - HBA1C is between 0 and 7.9 and within 180 days    HbA1c, POC (controlled diabetic range)  Date Value Ref Range Status  06/12/2020 6.2 0.0 - 7.0 % Final   Hgb A1c MFr Bld  Date Value Ref Range Status  10/05/2020 6.7 (H) 4.8 - 5.6 % Final    Comment:             Prediabetes: 5.7 - 6.4          Diabetes: >6.4          Glycemic control for adults with diabetes: <7.0          Passed - Valid encounter within last 6 months    Recent Outpatient Visits          1 month ago [place], [place] W, NP   2 months ago Type 2 diabetes mellitus with retinopathy, with long-term current use of insulin, macular edema presence unspecified, unspecified laterality, unspecified retinopathy severity ([place])   [place] [name], MD   5 months ago Left hip pain   [place] [name], MD   8 months ago Type 2 diabetes mellitus with retinopathy, with long-term current use of insulin, macular edema presence unspecified, unspecified laterality, unspecified retinopathy severity ([place])   [place] [name], MD   11 months ago Type 2 diabetes mellitus with retinopathy, with long-term current use of insulin, macular edema presence unspecified, unspecified laterality, unspecified retinopathy severity ([place])   [place], MD      Future Appointments            In 3 months  [name], MD [place]

## 2021-04-13 ENCOUNTER — Ambulatory Visit: Payer: BC Managed Care – PPO | Admitting: Podiatry

## 2021-04-17 ENCOUNTER — Ambulatory Visit: Payer: BC Managed Care – PPO | Admitting: Podiatry

## 2021-04-25 ENCOUNTER — Other Ambulatory Visit: Payer: Self-pay | Admitting: Critical Care Medicine

## 2021-04-25 DIAGNOSIS — I10 Essential (primary) hypertension: Secondary | ICD-10-CM

## 2021-04-25 NOTE — Telephone Encounter (Signed)
Request for future fills- too soon if filled 04/25/21 Requested Prescriptions  Pending Prescriptions Disp Refills   chlorthalidone (HYGROTON) 25 MG tablet [Pharmacy Med Name: chlorthalidone 25 mg tablet] 30 tablet 2    Sig: TAKE 1 TABLET BY MOUTH EVERY DAY     Cardiovascular: Diuretics - Thiazide Failed - 04/25/2021  8:36 AM      Failed - Last BP in normal range    BP Readings from Last 1 Encounters:  01/01/21 (!) 153/83         Passed - Ca in normal range and within 360 days    Calcium  Date Value Ref Range Status  10/05/2020 9.2 8.7 - 10.2 mg/dL Final         Passed - Cr in normal range and within 360 days    Creat  Date Value Ref Range Status  01/16/2016 0.95 0.70 - 1.33 mg/dL Final    Comment:      For patients > or = 57 years of age: The upper reference limit for Creatinine is approximately 13% higher for people identified as African-American.      Creatinine, Ser  Date Value Ref Range Status  10/05/2020 1.21 0.76 - 1.27 mg/dL Final   Creatinine, Urine  Date Value Ref Range Status  08/12/2010 201.3 mg/dL Final         Passed - K in normal range and within 360 days    Potassium  Date Value Ref Range Status  10/05/2020 3.7 3.5 - 5.2 mmol/L Final         Passed - Na in normal range and within 360 days    Sodium  Date Value Ref Range Status  10/05/2020 138 134 - 144 mmol/L Final         Passed - Valid encounter within last 6 months    Recent Outpatient Visits          3 months ago [place], [place] W, NP   3 months ago Type 2 diabetes mellitus with retinopathy, with long-term current use of insulin, macular edema presence unspecified, unspecified laterality, unspecified retinopathy severity ([place])   [place] [name], MD   6 months ago Left hip pain   [place] [name], MD   10 months ago Type 2 diabetes mellitus with  retinopathy, with long-term current use of insulin, macular edema presence unspecified, unspecified laterality, unspecified retinopathy severity ([place])   [place] [name], MD   1 year ago Type 2 diabetes mellitus with retinopathy, with long-term current use of insulin, macular edema presence unspecified, unspecified laterality, unspecified retinopathy severity ([place])   [place], MD      Future Appointments            In 1 month [name], MD [place]            amLODipine (NORVASC) 10 MG tablet [Pharmacy Med Name: amlodipine 10 mg tablet] 90 tablet 2    Sig: TAKE 1 TABLET BY MOUTH EVERY DAY     Cardiovascular:  Calcium Channel Blockers Failed - 04/25/2021  8:36 AM      Failed - Last BP in normal range    BP Readings from Last 1 Encounters:  01/01/21 (!) 153/83         Passed - Valid  encounter within last 6 months    Recent Outpatient Visits          3 months ago [place], [place] W, NP   3 months ago Type 2 diabetes mellitus with retinopathy, with long-term current use of insulin, macular edema presence unspecified, unspecified laterality, unspecified retinopathy severity [facility])   [place] [name], MD   6 months ago Left hip pain   [place] [name], MD   10 months ago Type 2 diabetes mellitus with retinopathy, with long-term current use of insulin, macular edema presence unspecified, unspecified laterality, unspecified retinopathy severity ([place])   [place], Patrick E, MD   1 year ago Type 2 diabetes mellitus with retinopathy, with long-term current use of insulin, macular edema presence unspecified, unspecified laterality, unspecified retinopathy severity [facility])   [place], MD      Future Appointments            In 1 month Joya Gaskins Burnett Harry, MD [place]

## 2021-05-04 ENCOUNTER — Other Ambulatory Visit: Payer: Self-pay

## 2021-05-04 ENCOUNTER — Ambulatory Visit: Payer: BC Managed Care – PPO | Admitting: Podiatry

## 2021-05-04 DIAGNOSIS — L84 Corns and callosities: Secondary | ICD-10-CM | POA: Diagnosis not present

## 2021-05-04 DIAGNOSIS — E1169 Type 2 diabetes mellitus with other specified complication: Secondary | ICD-10-CM

## 2021-05-04 DIAGNOSIS — B351 Tinea unguium: Secondary | ICD-10-CM

## 2021-05-04 DIAGNOSIS — E1151 Type 2 diabetes mellitus with diabetic peripheral angiopathy without gangrene: Secondary | ICD-10-CM | POA: Diagnosis not present

## 2021-05-04 NOTE — Progress Notes (Signed)
Subjective:  Patient ID: Justin Houston, male    DOB: 02-25-1966,  MRN: [id number]  Chief Complaint  Patient presents with   Nail Problem    Patient is here for a [facility]   56 y.o. male presents with the above complaint. History confirmed with patient.  Denies new pedal issues.  Last saw his PCP Dr. Joya Gaskins in 2022.  Did not check his blood sugar this a.m. last A1c 6.5  Objective:  Physical Exam: warm, good capillary refill, nail exam onychomycosis of the toenails, no trophic changes or ulcerative lesions. DP pulses palpable and protective sensation intact PT pulse non-palp Left Foot: normal exam, no swelling, tenderness, instability; ligaments intact, full range of motion of all ankle/foot joints. HPK lateral 5th met base Right Foot: normal exam, no swelling, tenderness, instability; ligaments intact, full range of motion of all ankle/foot joints   No images are attached to the encounter.  Assessment:   1. Onychomycosis of multiple toenails with type 2 diabetes mellitus and peripheral angiopathy (HCC)   2. Callus   3. Diabetes mellitus type 2 with peripheral artery disease ([place])    Plan:  Patient was evaluated and treated and all questions answered.  Onychomycosis, Diabetes and PAD -Patient is diabetic with a qualifying condition for at risk foot care.   Procedure: Nail Debridement Type of Debridement: manual, sharp debridement. Instrumentation: Nail nipper, rotary burr. Number of Nails: 10  Procedure: Paring of Lesion Rationale: painful hyperkeratotic lesion Type of Debridement: manual, sharp debridement. Instrumentation: 312 blade Number of Lesions: 1    No follow-ups on file.

## 2021-06-03 NOTE — Progress Notes (Incomplete)
Established Patient Office Visit  Subjective:  Patient ID: Justin Houston, male    DOB: May 23, 1965  Age: 56 y.o. MRN: [id number]  CC: No chief complaint on file.   HPI Justin Houston presents for ***  Past Medical History:  Diagnosis Date   Dermatitis    Diabetes mellitus    GERD (gastroesophageal reflux disease)    Gout    Hyperlipidemia    Hypertension    Lipoma of scalp    Mass of head, right postauricular SQ 7x4cm 09/29/2013   Obesity    Toenail fungus 08/31/2018    Past Surgical History:  Procedure Laterality Date   COLONOSCOPY WITH PROPOFOL N/A 12/01/2014   Procedure: COLONOSCOPY WITH PROPOFOL;  Surgeon: [name], MD;  [facility];  Service: Endoscopy;  Laterality: N/A;   LIPOMA EXCISION N/A 01/28/2020   Procedure: EXCISION RIGHT SCALP LIPOMA;  Surgeon: Cindra Presume, MD;  Location: [facility];  Service: Plastics;  Laterality: N/A;   right arm ORIF      Family History  Problem Relation Age of Onset   Stroke Mother    Hypertension Mother    Diabetes Maternal Grandmother    Prostate cancer Maternal Grandfather    Diabetes Other     Social History   Socioeconomic History   Marital status: Single    Spouse name: n/a   Number of children: 0   Years of education: 18   Highest education level: Not on file  Occupational History   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  Lives alone.   No siblings.   Social Determinants of Health   Financial Resource  Strain: Not on file  Food Insecurity: Not on file  Transportation Needs: Not on file  Physical Activity: Not on file  Stress: Not on file  Social Connections: Not on file  Intimate Partner Violence: Not on file    Outpatient Medications Prior to Visit  Medication Sig Dispense Refill   ALPHAGAN P 0.1 % SOLN Place 1 drop into both eyes 2 (two) times daily. 15 mL 1   amLODipine (NORVASC) 10 MG tablet Take 1 tablet (10 mg total) by mouth daily. 90 tablet 2   atorvastatin (LIPITOR) 40 MG tablet Take 1 tablet (40 mg total) by mouth daily. 90 tablet 3   bimatoprost (LUMIGAN) 0.03 % ophthalmic solution SMARTSIG:1 Drop(s) In Eye(s) Every Evening     Blood Glucose Monitoring Suppl (ONETOUCH VERIO) w/Device KIT Use as instructed to check blood sugar 3 times daily. E11.319 Z79.4 1 kit 0   carvedilol (COREG) 25 MG tablet Take 1 tablet (25 mg total) by mouth 2 (two) times daily with a meal. 120 tablet 2   chlorthalidone (HYGROTON) 25 MG tablet Take 1 tablet (25 mg total) by mouth daily. 30 tablet 0   clobetasol ointment (TEMOVATE) 8.58 % Apply 1 application topically 3 (three) times daily as needed (rash.). 60 g 1   dapagliflozin propanediol (FARXIGA)  5 MG TABS tablet Take 1 tablet (5 mg total) by mouth daily. 30 tablet 6   Dulaglutide (TRULICITY) 7.84 ON/6.2XB SOPN INJECT 0.75 INTO THE SKIN ONCE WEEKLY. 2 mL 3   glucose blood (ONETOUCH VERIO) test strip USE AS INSTRUCTED TO CHECK BLOOD SUGAR 3 TIMES DAILY 100 strip 11   hydrALAZINE (APRESOLINE) 100 MG tablet Take 1 tablet (100 mg total) by mouth 3 (three) times daily. 90 tablet 4   insulin detemir (LEVEMIR) 100 UNIT/ML injection INJECT 40 UNITS SUBCUTANEOUSLY 2 TIMES DAILY 20 mL 6   Insulin Syringe-Needle U-100 (BD INSULIN SYRINGE U/F) 31G X 5/16" 1 ML MISC USE TO INJECT INSULIN 2 TIMES DAILY AS DIRECTED 100 each 11   meloxicam (MOBIC) 15 MG tablet Take 1 tablet (15 mg total) by mouth daily. 30 tablet 2   metFORMIN (GLUCOPHAGE) 1000 MG  tablet Take 1 tablet (1,000 mg total) by mouth 2 (two) times daily with a meal. 120 tablet 1   omeprazole (PRILOSEC) 20 MG capsule Take 1 capsule (20 mg total) by mouth daily. 60 capsule 3   OneTouch Delica Lancets 28U MISC Use as instructed to check blood sugar 3 times daily. E11.319 Z79.4 100 each 11   STIMULANT LAXATIVE 8.6-50 MG tablet TAKE 1 TABLET BY MOUTH DAILY 30 tablet 1   No facility-administered medications prior to visit.    Allergies  Allergen Reactions   Losartan Potassium-Hctz Hives   Cozaar [Losartan Potassium] Hives    Hives     ROS Review of Systems    Objective:    Physical Exam  There were no vitals taken for this visit. Wt Readings from Last 3 Encounters:  01/01/21 (!) 330 lb 3.2 oz (149.8 kg)  10/03/20 (!) 333 lb (151 kg)  06/12/20 (!) 331 lb 6.4 oz (150.3 kg)     Health Maintenance Due  Topic Date Due   COVID-19 Vaccine (5 - Booster for Pfizer series) 12/19/2020   Zoster Vaccines- Shingrix (2 of 2) 02/26/2021   HEMOGLOBIN A1C  04/06/2021    There are no preventive care reminders to display for this patient.  Lab Results  Component Value Date   TSH 3.450 07/12/2017   Lab Results  Component Value Date   WBC 4.9 10/05/2020   HGB 15.2 10/05/2020   HCT 44.3 10/05/2020   MCV 88 10/05/2020   PLT 186 10/05/2020   Lab Results  Component Value Date   NA 138 10/05/2020   K 3.7 10/05/2020   CO2 24 10/05/2020   GLUCOSE 143 (H) 10/05/2020   BUN 15 10/05/2020   CREATININE 1.21 10/05/2020   BILITOT 1.1 10/05/2020   ALKPHOS 104 10/05/2020   AST 25 10/05/2020   ALT 23 10/05/2020   PROT 7.2 10/05/2020   ALBUMIN 4.2 10/05/2020   CALCIUM 9.2 10/05/2020   ANIONGAP 12 01/26/2020   EGFR 70 10/05/2020   Lab Results  Component Value Date   CHOL 212 (H) 10/05/2020   Lab Results  Component Value Date   HDL 40 10/05/2020   Lab Results  Component Value Date   LDLCALC 139 (H) 10/05/2020   Lab Results  Component Value Date   TRIG 184  (H) 10/05/2020   Lab Results  Component Value Date   CHOLHDL 5.3 (H) 10/05/2020   Lab Results  Component Value Date   HGBA1C 6.7 (H) 10/05/2020      Assessment & Plan:   Problem List Items Addressed This Visit   None   No orders of the defined  types were placed in this encounter.   Follow-up: No follow-ups on file.    Asencion Noble, MD

## 2021-06-04 ENCOUNTER — Ambulatory Visit: Payer: BC Managed Care – PPO | Admitting: Critical Care Medicine

## 2021-06-13 ENCOUNTER — Other Ambulatory Visit: Payer: Self-pay | Admitting: Critical Care Medicine

## 2021-06-13 NOTE — Telephone Encounter (Signed)
Requested Prescriptions  ?Pending Prescriptions Disp Refills  ?? insulin detemir (LEVEMIR) 100 UNIT/ML injection [Pharmacy Med Name: Levemir U-100 Insulin 100 unit/mL subcutaneous solution] 20 mL 2  ?  Sig: INJECT 40 UNITS SUBCUTANEOUSLY 2 TIMES DAILY  ?  ? Endocrinology:  Diabetes - Insulins Failed - 06/13/2021  3:30 PM  ?  ?  Failed - HBA1C is between 0 and 7.9 and within 180 days  ?  HbA1c, POC (controlled diabetic range)  ?Date Value Ref Range Status  ?06/12/2020 6.2 0.0 - 7.0 % Final  ? ?Hgb A1c MFr Bld  ?Date Value Ref Range Status  ?10/05/2020 6.7 (H) 4.8 - 5.6 % Final  ?  Comment:  ?           Prediabetes: 5.7 - 6.4 ?         Diabetes: >6.4 ?         Glycemic control for adults with diabetes: <7.0 ?  ?   ?  ?  Passed - Valid encounter within last 6 months  ?  Recent Outpatient Visits   ?      ? 4 months ago COVID-19  ? [place], [place] W, NP  ? 5 months ago Type 2 diabetes mellitus with retinopathy, with long-term current use of insulin, macular edema presence unspecified, unspecified laterality, unspecified retinopathy severity ([place])  ? [place] [name], MD  ? 8 months ago Left hip pain  ? [place] [name], MD  ? 1 year ago Type 2 diabetes mellitus with retinopathy, with long-term current use of insulin, macular edema presence unspecified, unspecified laterality, unspecified retinopathy severity ([place])  ? [place] [name], MD  ? 1 year ago Type 2 diabetes mellitus with retinopathy, with long-term current use of insulin, macular edema presence unspecified, unspecified laterality, unspecified retinopathy severity ([place])  ? [place] [name], MD  ?  ?  ?Future Appointments   ?        ? In 2 weeks Thereasa Solo, [place]  ?  ? ?  ?  ?  ? ?

## 2021-06-27 ENCOUNTER — Ambulatory Visit: Payer: BC Managed Care – PPO | Admitting: Physician Assistant

## 2021-06-30 ENCOUNTER — Other Ambulatory Visit: Payer: Self-pay | Admitting: Critical Care Medicine

## 2021-07-02 NOTE — Telephone Encounter (Signed)
Requested Prescriptions  ?Pending Prescriptions Disp Refills  ?? meloxicam (MOBIC) 15 MG tablet [Pharmacy Med Name: meloxicam 15 mg tablet] 30 tablet 2  ?  Sig: TAKE 1 TABLET BY MOUTH EVERY DAY  ?  ? Analgesics:  COX2 Inhibitors Failed - 06/30/2021 11:44 AM  ?  ?  Failed - Manual Review: Labs are only required if the patient has taken medication for more than 8 weeks.  ?  ?  Passed - HGB in normal range and within 360 days  ?  Hemoglobin  ?Date Value Ref Range Status  ?10/05/2020 15.2 13.0 - 17.7 g/dL Final  ?   ?  ?  Passed - Cr in normal range and within 360 days  ?  Creat  ?Date Value Ref Range Status  ?01/16/2016 0.95 0.70 - 1.33 mg/dL Final  ?  Comment:  ?    ?For patients > or = 57 years of age: The upper reference limit for ?Creatinine is approximately 13% higher for people identified as ?African-American. ?  ?  ? ?Creatinine, Ser  ?Date Value Ref Range Status  ?10/05/2020 1.21 0.76 - 1.27 mg/dL Final  ? ?Creatinine, Urine  ?Date Value Ref Range Status  ?08/12/2010 201.3 mg/dL Final  ?   ?  ?  Passed - HCT in normal range and within 360 days  ?  Hematocrit  ?Date Value Ref Range Status  ?10/05/2020 44.3 37.5 - 51.0 % Final  ?   ?  ?  Passed - AST in normal range and within 360 days  ?  AST  ?Date Value Ref Range Status  ?10/05/2020 25 0 - 40 IU/L Final  ?   ?  ?  Passed - ALT in normal range and within 360 days  ?  ALT  ?Date Value Ref Range Status  ?10/05/2020 23 0 - 44 IU/L Final  ?   ?  ?  Passed - eGFR is 30 or above and within 360 days  ?  GFR calc Af Amer  ?Date Value Ref Range Status  ?11/09/2019 75 >59 mL/min/1.73 Final  ?  Comment:  ?  **[facility] currently reports eGFR in compliance with the current** ?  recommendations of the [employer]. [facility] will ?  update reporting as new guidelines are published from the NKF-ASN ?  Task force. ?  ? ?GFR, Estimated  ?Date Value Ref Range Status  ?01/26/2020 >60 >60 mL/min Final  ? ?eGFR  ?Date Value Ref Range Status  ?10/05/2020 70 >59 mL/min/1.73  Final  ?   ?  ?  Passed - Patient is not pregnant  ?  ?  Passed - Valid encounter within last 12 months  ?  Recent Outpatient Visits   ?      ? 5 months ago COVID-19  ? [place], [place] W, NP  ? 6 months ago Type 2 diabetes mellitus with retinopathy, with long-term current use of insulin, macular edema presence unspecified, unspecified laterality, unspecified retinopathy severity ([place])  ? [place] [name], MD  ? 9 months ago Left hip pain  ? [place] [name], MD  ? 1 year ago Type 2 diabetes mellitus with retinopathy, with long-term current use of insulin, macular edema presence unspecified, unspecified laterality, unspecified retinopathy severity ([place])  ? [place] [name], MD  ? 1 year ago Type 2 diabetes mellitus with retinopathy, with long-term  current use of insulin, macular edema presence unspecified, unspecified laterality, unspecified retinopathy severity ([place])  ? [place] [name], MD  ?  ?  ?Future Appointments   ?        ? In 2 months [name], MD [place]  ?  ? ?  ?  ?  ?? carvedilol (COREG) 25 MG tablet [Pharmacy Med Name: carvedilol 25 mg tablet] 120 tablet 2  ?  Sig: TAKE 1 TABLET BY MOUTH 2 TIMES DAILY WITH MEAL  ?  ? Cardiovascular: Beta Blockers 3 Failed - 06/30/2021 11:44 AM  ?  ?  Failed - Last BP in normal range  ?  BP Readings from Last 1 Encounters:  ?01/01/21 (!) 153/83  ?   ?  ?  Passed - Cr in normal range and within 360 days  ?  Creat  ?Date Value Ref Range Status  ?01/16/2016 0.95 0.70 - 1.33 mg/dL Final  ?  Comment:  ?    ?For patients > or = 57 years of age: The upper reference limit for ?Creatinine is approximately 13% higher for people identified as ?African-American. ?  ?  ? ?Creatinine, Ser  ?Date Value Ref Range Status  ?10/05/2020  1.21 0.76 - 1.27 mg/dL Final  ? ?Creatinine, Urine  ?Date Value Ref Range Status  ?08/12/2010 201.3 mg/dL Final  ?   ?  ?  Passed - AST in normal range and within 360 days  ?  AST  ?Date Value Ref Range Status  ?10/05/2020 25 0 - 40 IU/L Final  ?   ?  ?  Passed - ALT in normal range and within 360 days  ?  ALT  ?Date Value Ref Range Status  ?10/05/2020 23 0 - 44 IU/L Final  ?   ?  ?  Passed - Last Heart Rate in normal range  ?  Pulse Readings from Last 1 Encounters:  ?01/01/21 76  ?   ?  ?  Passed - Valid encounter within last 6 months  ?  Recent Outpatient Visits   ?      ? 5 months ago COVID-19  ? [place], [place] W, NP  ? 6 months ago Type 2 diabetes mellitus with retinopathy, with long-term current use of insulin, macular edema presence unspecified, unspecified laterality, unspecified retinopathy severity ([place])  ? [place] [name], MD  ? 9 months ago Left hip pain  ? [place] [name], MD  ? 1 year ago Type 2 diabetes mellitus with retinopathy, with long-term current use of insulin, macular edema presence unspecified, unspecified laterality, unspecified retinopathy severity ([place])  ? [place] [name], MD  ? 1 year ago Type 2 diabetes mellitus with retinopathy, with long-term current use of insulin, macular edema presence unspecified, unspecified laterality, unspecified retinopathy severity ([place])  ? [place] [name], MD  ?  ?  ?Future Appointments   ?        ? In 2 months [name], MD [place]  ?  ? ?  ?  ?  ? ? ?

## 2021-08-01 ENCOUNTER — Ambulatory Visit: Payer: BC Managed Care – PPO | Admitting: Podiatry

## 2021-08-03 ENCOUNTER — Ambulatory Visit: Payer: BC Managed Care – PPO | Admitting: Podiatry

## 2021-08-29 ENCOUNTER — Other Ambulatory Visit: Payer: Self-pay | Admitting: Critical Care Medicine

## 2021-08-29 DIAGNOSIS — I10 Essential (primary) hypertension: Secondary | ICD-10-CM

## 2021-09-02 NOTE — Progress Notes (Incomplete)
Established Patient Office Visit  Subjective:  Patient ID: Justin Houston, male    DOB: 1966/01/19  Age: 56 y.o. MRN: [id number]  CC:  No chief complaint on file.   HPI 12/2020 Justin Houston presents for primary care follow-up visit.  Note on arrival blood pressure was elevated at 153/83.  Patient has been compliant with all of his blood pressure medications Needs Flu, foot , eye which include amlodipine 10 mg daily, Coreg 25 mg twice daily, chlorthalidone 25 mg daily, hydralazine 50 mg 3 times daily.  Patient continues with his diabetic medications including Farxiga 5 mg daily, Trulicity 75 mg weekly, metformin 1000 mg twice daily, insulin Levemir 40 units twice daily.  Patient needs refills on all these medications at this time.  He states his foot and hip pain are markedly better on the meloxicam.  He has occasional left calf soreness.  Patient still works as a Pharmacist, hospital in [place]. There are no further complaints.  09/02/21  Hypertension    Blood pressure not at goal increase hydralazine to 100 mg 3 times daily      Relevant Medications   amLODipine (NORVASC) 10 MG tablet   atorvastatin (LIPITOR) 40 MG tablet   carvedilol (COREG) 25 MG tablet   chlorthalidone (HYGROTON) 25 MG tablet   hydrALAZINE (APRESOLINE) 100 MG tablet     Endocrine   Diabetes mellitus type 2 with retinopathy ([place]) - Primary    Has regular follow-up with ophthalmology eyedrops refilled      Relevant Medications   atorvastatin (LIPITOR) 40 MG tablet   dapagliflozin propanediol (FARXIGA) 5 MG TABS tablet   Dulaglutide (TRULICITY) 1.66 AY/3.0ZS SOPN   insulin detemir (LEVEMIR) 100 UNIT/ML injection   metFORMIN (GLUCOPHAGE) 1000 MG tablet   OneTouch Delica Lancets 01U MISC   glucose blood (ONETOUCH VERIO) test strip   Other Relevant Orders   POCT glucose (manual entry) (Completed)   Hyperlipidemia associated with type 2 diabetes mellitus (HCC)    Continue current medications       Relevant Medications   atorvastatin (LIPITOR) 40 MG tablet   dapagliflozin propanediol (FARXIGA) 5 MG TABS tablet   Dulaglutide (TRULICITY) 9.32 TF/5.7DU SOPN   insulin detemir (LEVEMIR) 100 UNIT/ML injection   metFORMIN (GLUCOPHAGE) 1000 MG tablet     Other   BMI 45.0-49.9, adult (HCC)    Weight is down to 330 pounds encouraged continued weight loss      Relevant Medications   dapagliflozin propanediol (FARXIGA) 5 MG TABS tablet   Dulaglutide (TRULICITY) 2.02 RK/2.7CW SOPN   insulin detemir (LEVEMIR) 100 UNIT/ML injection   metFORMIN (GLUCOPHAGE) 1000 MG tablet   Left hip pain    Improved with meloxicam continue same      Other Visit Diagnoses     Need for immunization against influenza       Relevant Orders   Flu Vaccine QUAD 32moIM (Fluarix, Fluzone & Alfiuria Quad PF) (Completed)   Essential hypertension       Relevant Medications   amLODipine (NORVASC) 10 MG tablet   atorvastatin (LIPITOR) 40 MG tablet   carvedilol (COREG) 25 MG tablet   chlorthalidone (HYGROTON) 25 MG tablet   hydrALAZINE (APRESOLINE) 100 MG tablet   Hyperlipidemia, unspecified hyperlipidemia type       Relevant Medications   amLODipine (NORVASC) 10 MG tablet   atorvastatin (LIPITOR) 40 MG tablet   carvedilol (COREG) 25 MG tablet   chlorthalidone (HYGROTON) 25 MG tablet   hydrALAZINE (APRESOLINE) 100 MG tablet  Diabetes mellitus type 2 in obese (HCC)       Relevant Medications   atorvastatin (LIPITOR) 40 MG tablet   dapagliflozin propanediol (FARXIGA) 5 MG TABS tablet   Dulaglutide (TRULICITY) 9.82 ME/1.5AX SOPN   insulin detemir (LEVEMIR) 100 UNIT/ML injection   metFORMIN (GLUCOPHAGE) 1000 MG tablet   Elevated PSA          Past Medical History:  Diagnosis Date  . Dermatitis   . Diabetes mellitus   . GERD (gastroesophageal reflux disease)   . Gout   . Hyperlipidemia   . Hypertension   . Lipoma of scalp   . Mass of head, right postauricular SQ 7x4cm 09/29/2013  . Obesity   . Toenail  fungus 08/31/2018    Past Surgical History:  Procedure Laterality Date  . COLONOSCOPY WITH PROPOFOL N/A 12/01/2014   Procedure: COLONOSCOPY WITH PROPOFOL;  Surgeon: [name], MD;  Location: [facility];  Service: Endoscopy;  Laterality: N/A;  . LIPOMA EXCISION N/A 01/28/2020   Procedure: EXCISION RIGHT SCALP LIPOMA;  Surgeon: Cindra Presume, MD;  Location: [facility];  Service: Plastics;  Laterality: N/A;  . right arm ORIF      Family History  Problem Relation Age of Onset  . Stroke Mother   . Hypertension Mother   . Diabetes Maternal Grandmother   . Prostate cancer Maternal Grandfather   . Diabetes Other     Social History   Socioeconomic History  . Marital status: Single    Spouse name: n/a  . Number of children: 0  . Years of education: 44  . Highest education level: Not on file  Occupational History  . Occupation: Software engineer: [employer]  Tobacco Use  . Smoking status: Former    Types: Cigarettes    Quit date: 09/05/1994    Years since quitting: 27.0  . Smokeless tobacco: Never  Vaping Use  . Vaping Use: Never used  Substance and Sexual Activity  . Alcohol use: No    Alcohol/week: 0.0 standard drinks  . Drug use: No  . Sexual activity: Never  Other Topics Concern  . Not on file  Social History Narrative   Master's Degree in Adult Education.  Lives alone.   No siblings.   Social Determinants of Health   Financial Resource Strain: Not on file  Food Insecurity: Not on file  Transportation Needs: Not on file  Physical Activity: Not on file  Stress: Not on file  Social Connections: Not on file  Intimate Partner Violence: Not on file    Outpatient Medications Prior to Visit  Medication Sig Dispense Refill  . ALPHAGAN P 0.1 % SOLN Place 1 drop into both eyes 2 (two) times daily. 15 mL 1  . amLODipine (NORVASC) 10 MG tablet Take 1 tablet (10 mg total) by mouth daily. 90 tablet 2  . atorvastatin  (LIPITOR) 40 MG tablet Take 1 tablet (40 mg total) by mouth daily. 90 tablet 3  . bimatoprost (LUMIGAN) 0.03 % ophthalmic solution SMARTSIG:1 Drop(s) In Eye(s) Every Evening    . Blood Glucose Monitoring Suppl (ONETOUCH VERIO) w/Device KIT Use as instructed to check blood sugar 3 times daily. E11.319 Z79.4 1 kit 0  . carvedilol (COREG) 25 MG tablet TAKE 1 TABLET BY MOUTH 2 TIMES DAILY WITH MEAL 60 tablet 2  . chlorthalidone (HYGROTON) 25 MG tablet Take 1 tablet (25 mg total) by mouth daily. 30 tablet 0  . clobetasol ointment (TEMOVATE) 0.05 %  Apply 1 application topically 3 (three) times daily as needed (rash.). 60 g 1  . dapagliflozin propanediol (FARXIGA) 5 MG TABS tablet Take 1 tablet (5 mg total) by mouth daily. 30 tablet 6  . Dulaglutide (TRULICITY) 6.27 OJ/5.0KX SOPN INJECT 0.75 INTO THE SKIN ONCE WEEKLY. 2 mL 3  . glucose blood (ONETOUCH VERIO) test strip USE AS INSTRUCTED TO CHECK BLOOD SUGAR 3 TIMES DAILY 100 strip 11  . hydrALAZINE (APRESOLINE) 100 MG tablet Take 1 tablet (100 mg total) by mouth 3 (three) times daily. 90 tablet 4  . insulin detemir (LEVEMIR) 100 UNIT/ML injection INJECT 40 UNITS SUBCUTANEOUSLY 2 TIMES DAILY 20 mL 2  . Insulin Syringe-Needle U-100 (BD INSULIN SYRINGE U/F) 31G X 5/16" 1 ML MISC USE TO INJECT INSULIN 2 TIMES DAILY AS DIRECTED 100 each 11  . meloxicam (MOBIC) 15 MG tablet TAKE 1 TABLET BY MOUTH EVERY DAY 30 tablet 2  . metFORMIN (GLUCOPHAGE) 1000 MG tablet Take 1 tablet (1,000 mg total) by mouth 2 (two) times daily with a meal. 120 tablet 1  . omeprazole (PRILOSEC) 20 MG capsule Take 1 capsule (20 mg total) by mouth daily. 60 capsule 3  . OneTouch Delica Lancets 38H MISC Use as instructed to check blood sugar 3 times daily. E11.319 Z79.4 100 each 11  . STIMULANT LAXATIVE 8.6-50 MG tablet TAKE 1 TABLET BY MOUTH DAILY 30 tablet 1   No facility-administered medications prior to visit.    Allergies  Allergen Reactions  . Losartan Potassium-Hctz Hives  .  Cozaar [Losartan Potassium] Hives    Hives     ROS Review of Systems  Constitutional:  Negative for chills, diaphoresis and fever.  HENT:  Negative for congestion, hearing loss, nosebleeds, sore throat and tinnitus.   Eyes:  Negative for photophobia and redness.  Respiratory:  Negative for cough, shortness of breath, wheezing and stridor.   Cardiovascular:  Negative for chest pain, palpitations and leg swelling.  Gastrointestinal:  Negative for abdominal pain, blood in stool, constipation, diarrhea, nausea and vomiting.  Endocrine: Negative for polydipsia.  Genitourinary:  Negative for dysuria, flank pain, frequency, hematuria and urgency.  Musculoskeletal:  Negative for back pain, myalgias and neck pain.  Skin:  Negative for rash.  Allergic/Immunologic: Negative for environmental allergies.  Neurological:  Negative for dizziness, tremors, seizures, weakness and headaches.  Hematological:  Does not bruise/bleed easily.  Psychiatric/Behavioral:  Negative for suicidal ideas. The patient is not nervous/anxious.      Objective:    Physical Exam Vitals reviewed.  Constitutional:      Appearance: Normal appearance. He is well-developed. He is obese. He is not diaphoretic.  HENT:     Head: Normocephalic and atraumatic.     Nose: No nasal deformity, septal deviation, mucosal edema or rhinorrhea.     Right Sinus: No maxillary sinus tenderness or frontal sinus tenderness.     Left Sinus: No maxillary sinus tenderness or frontal sinus tenderness.     Mouth/Throat:     Pharynx: No oropharyngeal exudate.  Eyes:     General: No scleral icterus.    Conjunctiva/sclera: Conjunctivae normal.     Pupils: Pupils are equal, round, and reactive to light.  Neck:     Thyroid: No thyromegaly.     Vascular: No carotid bruit or JVD.     Trachea: Trachea normal. No tracheal tenderness or tracheal deviation.  Cardiovascular:     Rate and Rhythm: Normal rate and regular rhythm.     Chest Wall: PMI is  not displaced.     Pulses: Normal pulses. No decreased pulses.     Heart sounds: Normal heart sounds, S1 normal and S2 normal. Heart sounds not distant. No murmur heard. No systolic murmur is present.  No diastolic murmur is present.    No friction rub. No gallop. No S3 or S4 sounds.  Pulmonary:     Effort: Pulmonary effort is normal. No tachypnea, accessory muscle usage or respiratory distress.     Breath sounds: Normal breath sounds. No stridor. No decreased breath sounds, wheezing, rhonchi or rales.  Chest:     Chest wall: No tenderness.  Abdominal:     General: Bowel sounds are normal. There is no distension.     Palpations: Abdomen is soft. Abdomen is not rigid.     Tenderness: There is no abdominal tenderness. There is no guarding or rebound.  Musculoskeletal:        General: Normal range of motion.     Cervical back: Normal range of motion and neck supple. No edema, erythema or rigidity. No muscular tenderness. Normal range of motion.     Comments: Foot exam normal  Lymphadenopathy:     Head:     Right side of head: No submental or submandibular adenopathy.     Left side of head: No submental or submandibular adenopathy.     Cervical: No cervical adenopathy.  Skin:    General: Skin is warm and dry.     Coloration: Skin is not pale.     Findings: No rash.     Nails: There is no clubbing.  Neurological:     General: No focal deficit present.     Mental Status: He is alert and oriented to person, place, and time. Mental status is at baseline.     Sensory: No sensory deficit.  Psychiatric:        Mood and Affect: Mood normal.        Speech: Speech normal.        Behavior: Behavior normal.    There were no vitals taken for this visit. Wt Readings from Last 3 Encounters:  01/01/21 (!) 330 lb 3.2 oz (149.8 kg)  10/03/20 (!) 333 lb (151 kg)  06/12/20 (!) 331 lb 6.4 oz (150.3 kg)     Health Maintenance Due  Topic Date Due  . COVID-19 Vaccine (5 - Booster for Pfizer  series) 12/19/2020  . Zoster Vaccines- Shingrix (2 of 2) 02/26/2021  . HEMOGLOBIN A1C  04/06/2021     There are no preventive care reminders to display for this patient.  Lab Results  Component Value Date   TSH 3.450 07/12/2017   Lab Results  Component Value Date   WBC 4.9 10/05/2020   HGB 15.2 10/05/2020   HCT 44.3 10/05/2020   MCV 88 10/05/2020   PLT 186 10/05/2020   Lab Results  Component Value Date   NA 138 10/05/2020   K 3.7 10/05/2020   CO2 24 10/05/2020   GLUCOSE 143 (H) 10/05/2020   BUN 15 10/05/2020   CREATININE 1.21 10/05/2020   BILITOT 1.1 10/05/2020   ALKPHOS 104 10/05/2020   AST 25 10/05/2020   ALT 23 10/05/2020   PROT 7.2 10/05/2020   ALBUMIN 4.2 10/05/2020   CALCIUM 9.2 10/05/2020   ANIONGAP 12 01/26/2020   EGFR 70 10/05/2020   Lab Results  Component Value Date   CHOL 212 (H) 10/05/2020   Lab Results  Component Value Date   HDL 40 10/05/2020   Lab  Results  Component Value Date   LDLCALC 139 (H) 10/05/2020   Lab Results  Component Value Date   TRIG 184 (H) 10/05/2020   Lab Results  Component Value Date   CHOLHDL 5.3 (H) 10/05/2020   Lab Results  Component Value Date   HGBA1C 6.7 (H) 10/05/2020      Assessment & Plan:   Problem List Items Addressed This Visit   None  No orders of the defined types were placed in this encounter.  Flu vaccine and shingles vaccine was given Follow-up: No follow-ups on file.    Asencion Noble, MD

## 2021-09-03 ENCOUNTER — Ambulatory Visit (HOSPITAL_BASED_OUTPATIENT_CLINIC_OR_DEPARTMENT_OTHER): Payer: BC Managed Care – PPO | Admitting: Critical Care Medicine

## 2021-09-03 DIAGNOSIS — Z91199 Patient's noncompliance with other medical treatment and regimen due to unspecified reason: Secondary | ICD-10-CM

## 2021-09-03 NOTE — Progress Notes (Signed)
Patient ID: Justin Houston, male   DOB: 01-13-1966, 56 y.o.   MRN: [id number] No show for visit

## 2021-09-04 ENCOUNTER — Other Ambulatory Visit: Payer: Self-pay | Admitting: Critical Care Medicine

## 2021-09-04 DIAGNOSIS — E669 Obesity, unspecified: Secondary | ICD-10-CM

## 2021-09-05 NOTE — Telephone Encounter (Signed)
Requested medication (s) are due for refill today:   Yes  Requested medication (s) are on the active medication list:   Yes  Future visit scheduled:   No  Has cancelled at least his last 5 appts or been a No Show   Last ordered: 01/01/2021 #120, 1 refill  Returned for provider to review since cancelling his appts.     Requested Prescriptions  Pending Prescriptions Disp Refills   metFORMIN (GLUCOPHAGE) 1000 MG tablet [Pharmacy Med Name: metformin 1,000 mg tablet] 120 tablet 1    Sig: TAKE 1 TABLET BY MOUTH 2 TIMES DAILY WITH MEAL     Endocrinology:  Diabetes - Biguanides Failed - 09/04/2021  2:03 PM      Failed - HBA1C is between 0 and 7.9 and within 180 days    HbA1c, POC (controlled diabetic range)  Date Value Ref Range Status  06/12/2020 6.2 0.0 - 7.0 % Final   Hgb A1c MFr Bld  Date Value Ref Range Status  10/05/2020 6.7 (H) 4.8 - 5.6 % Final    Comment:             Prediabetes: 5.7 - 6.4          Diabetes: >6.4          Glycemic control for adults with diabetes: <7.0          Failed - B12 Level in normal range and within 720 days    No results found for: VITAMINB12       Failed - Valid encounter within last 6 months    Recent Outpatient Visits           2 days ago No-show for appointment   [place] [name], MD   7 months ago [place], [place] W, NP   8 months ago Type 2 diabetes mellitus with retinopathy, with long-term current use of insulin, macular edema presence unspecified, unspecified laterality, unspecified retinopathy severity [facility])   [place] [name], MD   11 months ago Left hip pain   [place] [name], MD   1 year ago Type 2 diabetes mellitus with retinopathy, with long-term current use of insulin, macular edema presence unspecified, unspecified laterality, unspecified retinopathy  severity [facility])   [place] [name], MD               Passed - Cr in normal range and within 360 days    Creat  Date Value Ref Range Status  01/16/2016 0.95 0.70 - 1.33 mg/dL Final    Comment:      For patients > or = 57 years of age: The upper reference limit for Creatinine is approximately 13% higher for people identified as African-American.      Creatinine, Ser  Date Value Ref Range Status  10/05/2020 1.21 0.76 - 1.27 mg/dL Final   Creatinine, Urine  Date Value Ref Range Status  08/12/2010 201.3 mg/dL Final         Passed - eGFR in normal range and within 360 days    GFR calc Af Amer  Date Value Ref Range Status  11/09/2019 75 >59 mL/min/1.73 Final    Comment:    **[facility] currently reports eGFR in compliance with the current**   recommendations of the [employer]. [facility] will   update reporting as new  guidelines are published from the NKF-ASN   Task force.    GFR, Estimated  Date Value Ref Range Status  01/26/2020 >60 >60 mL/min Final   eGFR  Date Value Ref Range Status  10/05/2020 70 >59 mL/min/1.73 Final         Passed - CBC within normal limits and completed in the last 12 months    WBC  Date Value Ref Range Status  10/05/2020 4.9 3.4 - 10.8 x10E3/uL Final  01/16/2016 6.0 3.8 - 10.8 K/uL Final   RBC  Date Value Ref Range Status  10/05/2020 5.04 4.14 - 5.80 x10E6/uL Final  01/16/2016 4.97 4.20 - 5.80 MIL/uL Final   Hemoglobin  Date Value Ref Range Status  10/05/2020 15.2 13.0 - 17.7 g/dL Final   Hematocrit  Date Value Ref Range Status  10/05/2020 44.3 37.5 - 51.0 % Final   MCHC  Date Value Ref Range Status  10/05/2020 34.3 31.5 - 35.7 g/dL Final  01/16/2016 34.4 32.0 - 36.0 g/dL Final   [facility]  Date Value Ref Range Status  10/05/2020 30.2 26.6 - 33.0 pg Final  01/16/2016 30.2 27.0 - 33.0 pg Final   MCV  Date Value Ref Range Status  10/05/2020 88 79 - 97 fL Final   Platelet  Count, POC  Date Value Ref Range Status  01/24/2014 206 142 - 424 K/uL Final   RDW  Date Value Ref Range Status  10/05/2020 13.5 11.6 - 15.4 % Final   RDW, POC  Date Value Ref Range Status  01/24/2014 13.3 % Final

## 2021-09-08 ENCOUNTER — Other Ambulatory Visit: Payer: Self-pay | Admitting: Critical Care Medicine

## 2021-09-08 DIAGNOSIS — I10 Essential (primary) hypertension: Secondary | ICD-10-CM

## 2021-10-01 ENCOUNTER — Encounter: Payer: Self-pay | Admitting: Podiatry

## 2021-10-01 ENCOUNTER — Ambulatory Visit: Payer: BC Managed Care – PPO | Admitting: Podiatry

## 2021-10-01 DIAGNOSIS — E11319 Type 2 diabetes mellitus with unspecified diabetic retinopathy without macular edema: Secondary | ICD-10-CM

## 2021-10-01 DIAGNOSIS — E1169 Type 2 diabetes mellitus with other specified complication: Secondary | ICD-10-CM | POA: Diagnosis not present

## 2021-10-01 DIAGNOSIS — M79675 Pain in left toe(s): Secondary | ICD-10-CM | POA: Diagnosis not present

## 2021-10-01 DIAGNOSIS — M79674 Pain in right toe(s): Secondary | ICD-10-CM | POA: Diagnosis not present

## 2021-10-01 DIAGNOSIS — L84 Corns and callosities: Secondary | ICD-10-CM

## 2021-10-01 DIAGNOSIS — E1151 Type 2 diabetes mellitus with diabetic peripheral angiopathy without gangrene: Secondary | ICD-10-CM

## 2021-10-01 DIAGNOSIS — Z794 Long term (current) use of insulin: Secondary | ICD-10-CM | POA: Diagnosis not present

## 2021-10-01 DIAGNOSIS — B351 Tinea unguium: Secondary | ICD-10-CM

## 2021-10-01 NOTE — Progress Notes (Signed)
This patient returns to my office for at risk foot care.  This patient requires this care by a professional since this patient will be at risk due to having diabetes.  This patient is unable to cut nails himself since the patient cannot reach his nails.These nails are painful walking and wearing shoes.  He also has callus on the outside of his left foot.This patient presents for at risk foot care today.  General Appearance  Alert, conversant and in no acute stress.  Vascular  Dorsalis pedis and posterior tibial  pulses are palpable  bilaterally.  Capillary return is within normal limits  bilaterally. Temperature is within normal limits  bilaterally.  Neurologic  Senn-Weinstein monofilament wire test within normal limits  bilaterally. Muscle power within normal limits bilaterally.  Nails Thick disfigured discolored nails with subungual debris  from hallux to fifth toes bilaterally. No evidence of bacterial infection or drainage bilaterally.  Orthopedic  No limitations of motion  feet .  No crepitus or effusions noted.  No bony pathology or digital deformities noted.  Skin  normotropic skin with no porokeratosis noted bilaterally.  No signs of infections or ulcers noted.   Callus lateral aspect fifth metabase left foot.  Onychomycosis  Pain in right toes  Pain in left toes  Callus left foot.  Consent was obtained for treatment procedures.   Mechanical debridement of nails 1-5  bilaterally performed with a nail nipper.  Filed with dremel without incident. Debride callus with # 15 blade and dremel tool.   Return office visit     4 months                 Told patient to return for periodic foot care and evaluation due to potential at risk complications.   Kynlie Jane DPM   

## 2021-10-19 ENCOUNTER — Telehealth: Payer: Self-pay

## 2021-10-19 NOTE — Telephone Encounter (Signed)
The pt is due for recall colon at [facility] with DJ for history of polyps.     Left message on machine to call back

## 2021-10-20 ENCOUNTER — Other Ambulatory Visit: Payer: Self-pay | Admitting: Critical Care Medicine

## 2021-10-20 DIAGNOSIS — I10 Essential (primary) hypertension: Secondary | ICD-10-CM

## 2021-10-22 ENCOUNTER — Other Ambulatory Visit: Payer: Self-pay

## 2021-10-22 DIAGNOSIS — Z86010 Personal history of colonic polyps: Secondary | ICD-10-CM

## 2021-10-22 MED ORDER — PEG 3350-KCL-NA BICARB-NACL 420 G PO SOLR: 4000.0000 mL | Freq: Once | ORAL | 0 refills | Status: AC

## 2021-10-22 NOTE — Telephone Encounter (Signed)
I spoke with the pt and he agrees to the colon on 9/14 at [facility].  I will send him all the instructions to My Chart and his home address.  Prep has been sent to the pharmacy.

## 2021-10-29 ENCOUNTER — Other Ambulatory Visit: Payer: Self-pay | Admitting: Critical Care Medicine

## 2021-10-29 DIAGNOSIS — I10 Essential (primary) hypertension: Secondary | ICD-10-CM

## 2021-11-01 ENCOUNTER — Other Ambulatory Visit: Payer: Self-pay | Admitting: Critical Care Medicine

## 2021-11-01 NOTE — Telephone Encounter (Signed)
Medication Refill - Medication: meloxicam (MOBIC) 15 MG tablet  Has the patient contacted their pharmacy? Yes.    (Agent: If yes, when and what did the pharmacy advise?) Pharmacy states several request have been sent starting from 10/19/2021. Pharmacy would like request expedited   Preferred Pharmacy (with phone number or street name):   [facility] - [place], [place] - [address] Phone:  [phone]  Fax:  [phone]      Has the patient been seen for an appointment in the last year OR does the patient have an upcoming appointment? Yes.    Agent: Please be advised that RX refills may take up to 3 business days. We ask that you follow-up with your pharmacy.

## 2021-11-02 ENCOUNTER — Other Ambulatory Visit: Payer: Self-pay | Admitting: Critical Care Medicine

## 2021-11-02 DIAGNOSIS — E1169 Type 2 diabetes mellitus with other specified complication: Secondary | ICD-10-CM

## 2021-11-02 MED ORDER — MELOXICAM 15 MG PO TABS: 15.0000 mg | ORAL_TABLET | Freq: Every day | ORAL | 0 refills | Status: DC

## 2021-11-02 NOTE — Telephone Encounter (Signed)
Requested medication (s) are due for refill today: yes  Requested medication (s) are on the active medication list: yes  Last refill:  07/02/21 #30 2 refills  Future visit scheduled: yes in 2 weeks  Notes to clinic:  protocol failed last labs 10/05/20. Do you want to refill Rx?     Requested Prescriptions  Pending Prescriptions Disp Refills   meloxicam (MOBIC) 15 MG tablet 30 tablet 2    Sig: Take 1 tablet (15 mg total) by mouth daily.     Analgesics:  COX2 Inhibitors Failed - 11/01/2021 11:18 AM      Failed - Manual Review: Labs are only required if the patient has taken medication for more than 8 weeks.      Failed - HGB in normal range and within 360 days    Hemoglobin  Date Value Ref Range Status  10/05/2020 15.2 13.0 - 17.7 g/dL Final         Failed - Cr in normal range and within 360 days    Creat  Date Value Ref Range Status  01/16/2016 0.95 0.70 - 1.33 mg/dL Final    Comment:      For patients > or = 57 years of age: The upper reference limit for Creatinine is approximately 13% higher for people identified as African-American.      Creatinine, Ser  Date Value Ref Range Status  10/05/2020 1.21 0.76 - 1.27 mg/dL Final   Creatinine, Urine  Date Value Ref Range Status  08/12/2010 201.3 mg/dL Final         Failed - HCT in normal range and within 360 days    Hematocrit  Date Value Ref Range Status  10/05/2020 44.3 37.5 - 51.0 % Final         Failed - AST in normal range and within 360 days    AST  Date Value Ref Range Status  10/05/2020 25 0 - 40 IU/L Final         Failed - ALT in normal range and within 360 days    ALT  Date Value Ref Range Status  10/05/2020 23 0 - 44 IU/L Final         Failed - eGFR is 30 or above and within 360 days    GFR calc Af Amer  Date Value Ref Range Status  11/09/2019 75 >59 mL/min/1.73 Final    Comment:    **[facility] currently reports eGFR in compliance with the current**   recommendations of the [employer]. [facility] will   update reporting as new guidelines are published from the NKF-ASN   Task force.    GFR, Estimated  Date Value Ref Range Status  01/26/2020 >60 >60 mL/min Final   eGFR  Date Value Ref Range Status  10/05/2020 70 >59 mL/min/1.73 Final         Passed - Patient is not pregnant      Passed - Valid encounter within last 12 months    Recent Outpatient Visits           2 months ago No-show for appointment   [place] [name], MD   9 months ago [place], [place] W, NP   10 months ago Type 2 diabetes mellitus with retinopathy, with long-term current use of insulin, macular edema presence unspecified, unspecified laterality, unspecified retinopathy severity [facility])   [place], Patrick E, MD  1 year ago Left hip pain   [place] [name], MD   1 year ago Type 2 diabetes mellitus with retinopathy, with long-term current use of insulin, macular edema presence unspecified, unspecified laterality, unspecified retinopathy severity [facility])   [place], MD       Future Appointments             In 2 weeks [name], MD [place]

## 2021-11-02 NOTE — Telephone Encounter (Signed)
Requested Prescriptions  Pending Prescriptions Disp Refills  . FARXIGA 5 MG TABS tablet [Pharmacy Med Name: Wilder Glade 5 mg tablet] 30 tablet 6    Sig: TAKE 1 TABLET BY MOUTH EVERY DAY     Endocrinology:  Diabetes - SGLT2 Inhibitors Failed - 11/02/2021  1:24 PM      Failed - Cr in normal range and within 360 days    Creat  Date Value Ref Range Status  01/16/2016 0.95 0.70 - 1.33 mg/dL Final    Comment:      For patients > or = 57 years of age: The upper reference limit for Creatinine is approximately 13% higher for people identified as African-American.      Creatinine, Ser  Date Value Ref Range Status  10/05/2020 1.21 0.76 - 1.27 mg/dL Final   Creatinine, Urine  Date Value Ref Range Status  08/12/2010 201.3 mg/dL Final         Failed - HBA1C is between 0 and 7.9 and within 180 days    HbA1c, POC (controlled diabetic range)  Date Value Ref Range Status  06/12/2020 6.2 0.0 - 7.0 % Final   Hgb A1c MFr Bld  Date Value Ref Range Status  10/05/2020 6.7 (H) 4.8 - 5.6 % Final    Comment:             Prediabetes: 5.7 - 6.4          Diabetes: >6.4          Glycemic control for adults with diabetes: <7.0          Failed - eGFR in normal range and within 360 days    GFR calc Af Amer  Date Value Ref Range Status  11/09/2019 75 >59 mL/min/1.73 Final    Comment:    **[facility] currently reports eGFR in compliance with the current**   recommendations of the [employer]. [facility] will   update reporting as new guidelines are published from the NKF-ASN   Task force.    GFR, Estimated  Date Value Ref Range Status  01/26/2020 >60 >60 mL/min Final   eGFR  Date Value Ref Range Status  10/05/2020 70 >59 mL/min/1.73 Final         Failed - Valid encounter within last 6 months    Recent Outpatient Visits          2 months ago No-show for appointment   [place] [name], MD   9 months ago [place], [place] W, NP   10 months ago Type 2 diabetes mellitus with retinopathy, with long-term current use of insulin, macular edema presence unspecified, unspecified laterality, unspecified retinopathy severity ([place])   [place], Patrick E, MD   1 year ago Left hip pain   [place] [name], MD   1 year ago Type 2 diabetes mellitus with retinopathy, with long-term current use of insulin, macular edema presence unspecified, unspecified laterality, unspecified retinopathy severity [facility])   [place], MD      Future Appointments            In 2 weeks [name], MD [place]

## 2021-11-09 ENCOUNTER — Other Ambulatory Visit: Payer: Self-pay | Admitting: Critical Care Medicine

## 2021-11-18 NOTE — Progress Notes (Deleted)
   Established Patient Office Visit  Subjective   Patient ID: Justin Houston, male    DOB: March 12, 1966  Age: 56 y.o. MRN: [id number]  No chief complaint on file.   56 y.o.M HTN T2DM      {History (Optional):23778}  ROS    Objective:     There were no vitals taken for this visit. {Vitals History (Optional):23777}  Physical Exam   No results found for any visits on 11/19/21.  {Labs (Optional):23779}  The 10-year ASCVD risk score (Arnett DK, et al., 2019) is: 31%    Assessment & Plan:   Problem List Items Addressed This Visit   None   No follow-ups on file.    Asencion Noble, MD

## 2021-11-19 ENCOUNTER — Ambulatory Visit: Payer: BC Managed Care – PPO | Attending: Critical Care Medicine | Admitting: Critical Care Medicine

## 2021-11-19 ENCOUNTER — Encounter: Payer: Self-pay | Admitting: Critical Care Medicine

## 2021-11-19 VITALS — BP 145/78 | HR 73 | Ht 72.0 in | Wt 336.4 lb

## 2021-11-19 DIAGNOSIS — K219 Gastro-esophageal reflux disease without esophagitis: Secondary | ICD-10-CM | POA: Diagnosis not present

## 2021-11-19 DIAGNOSIS — Z794 Long term (current) use of insulin: Secondary | ICD-10-CM

## 2021-11-19 DIAGNOSIS — E11319 Type 2 diabetes mellitus with unspecified diabetic retinopathy without macular edema: Secondary | ICD-10-CM

## 2021-11-19 DIAGNOSIS — E113399 Type 2 diabetes mellitus with moderate nonproliferative diabetic retinopathy without macular edema, unspecified eye: Secondary | ICD-10-CM | POA: Diagnosis not present

## 2021-11-19 DIAGNOSIS — E785 Hyperlipidemia, unspecified: Secondary | ICD-10-CM

## 2021-11-19 DIAGNOSIS — E669 Obesity, unspecified: Secondary | ICD-10-CM

## 2021-11-19 DIAGNOSIS — Z6841 Body Mass Index (BMI) 40.0 and over, adult: Secondary | ICD-10-CM

## 2021-11-19 DIAGNOSIS — E1169 Type 2 diabetes mellitus with other specified complication: Secondary | ICD-10-CM | POA: Diagnosis not present

## 2021-11-19 DIAGNOSIS — I10 Essential (primary) hypertension: Secondary | ICD-10-CM | POA: Diagnosis not present

## 2021-11-19 DIAGNOSIS — M25552 Pain in left hip: Secondary | ICD-10-CM

## 2021-11-19 LAB — GLUCOSE, POCT (MANUAL RESULT ENTRY): POC Glucose: 206 mg/dl — AB (ref 70–99)

## 2021-11-19 LAB — POCT GLYCOSYLATED HEMOGLOBIN (HGB A1C): HbA1c, POC (controlled diabetic range): 7.9 % — AB (ref 0.0–7.0)

## 2021-11-19 MED ORDER — DAPAGLIFLOZIN PROPANEDIOL 5 MG PO TABS: 10.0000 mg | ORAL_TABLET | Freq: Every day | ORAL | 1 refills | Status: DC

## 2021-11-19 MED ORDER — OMEPRAZOLE 20 MG PO CPDR: 20.0000 mg | DELAYED_RELEASE_CAPSULE | Freq: Every day | ORAL | 3 refills | Status: DC

## 2021-11-19 MED ORDER — INSULIN DETEMIR 100 UNIT/ML ~~LOC~~ SOLN: SUBCUTANEOUS | 2 refills | Status: DC

## 2021-11-19 MED ORDER — METFORMIN HCL 1000 MG PO TABS: 1000.0000 mg | ORAL_TABLET | Freq: Two times a day (BID) | ORAL | 1 refills | Status: DC

## 2021-11-19 MED ORDER — BLOOD PRESSURE KIT DEVI: 0 refills | Status: DC

## 2021-11-19 MED ORDER — MELOXICAM 15 MG PO TABS: 15.0000 mg | ORAL_TABLET | Freq: Every day | ORAL | 0 refills | Status: DC

## 2021-11-19 MED ORDER — ATORVASTATIN CALCIUM 40 MG PO TABS: 40.0000 mg | ORAL_TABLET | Freq: Every day | ORAL | 3 refills | Status: DC

## 2021-11-19 MED ORDER — AMLODIPINE BESYLATE 10 MG PO TABS: 10.0000 mg | ORAL_TABLET | Freq: Every day | ORAL | 2 refills | Status: DC

## 2021-11-19 MED ORDER — "INSULIN SYRINGE-NEEDLE U-100 31G X 5/16"" 1 ML MISC": 11 refills | Status: DC

## 2021-11-19 MED ORDER — SPIRONOLACTONE 25 MG PO TABS: 25.0000 mg | ORAL_TABLET | Freq: Every day | ORAL | 2 refills | Status: DC

## 2021-11-19 MED ORDER — HYDRALAZINE HCL 100 MG PO TABS: 100.0000 mg | ORAL_TABLET | Freq: Three times a day (TID) | ORAL | 1 refills | Status: DC

## 2021-11-19 MED ORDER — TRULICITY 1.5 MG/0.5ML ~~LOC~~ SOAJ: 1.5000 mg | SUBCUTANEOUS | 3 refills | Status: DC

## 2021-11-19 MED ORDER — CARVEDILOL 25 MG PO TABS: ORAL_TABLET | ORAL | 2 refills | Status: DC

## 2021-11-19 MED ORDER — CHLORTHALIDONE 25 MG PO TABS: 25.0000 mg | ORAL_TABLET | Freq: Every day | ORAL | 1 refills | Status: DC

## 2021-11-19 NOTE — Assessment & Plan Note (Signed)
-  Continue omeprazole 20 mg daily

## 2021-11-19 NOTE — Assessment & Plan Note (Signed)
Improved with use of meloxicam as needed, refills sent today

## 2021-11-19 NOTE — Assessment & Plan Note (Signed)
Continue atorvastatin 40 mg daily Lipid panel done today

## 2021-11-19 NOTE — Assessment & Plan Note (Addendum)
Today's Hemoglobin A1C- 7.9% Increase daily farxiga dose to 10 mg daily  Increase weekly dose of trulicity to 1.5, emphasized importance of staying consistent with weekly doses  Lifestyle and diet handout reviewed in depth with patient today  Recommend patient replace battery in his home blood sugar machine for adequate daily monitoring  The following Lifestyle Medicine recommendations according to [place] of Lifestyle Medicine [facility]) were discussed and offered to patient who agrees to start the journey:  A. Whole Foods, Plant-based plate comprising of fruits and vegetables, plant-based proteins, whole-grain carbohydrates was discussed in detail with the patient.   A list for source of those nutrients were also provided to the patient.  Patient will use only water or unsweetened tea for hydration. B.  The need to stay away from risky substances including alcohol, smoking; obtaining 7 to 9 hours of restorative sleep, at least 150 minutes of moderate intensity exercise weekly, the importance of healthy social connections,  and stress reduction techniques were discussed. C.  A full color page of  Calorie density of various food groups per pound showing examples of each food groups was provided to the patient.

## 2021-11-19 NOTE — Progress Notes (Signed)
Established Patient Office Visit  Subjective   Patient ID: Justin Houston, male    DOB: Sep 07, 1965  Age: 56 y.o. MRN: [id number]  Chief Complaint  Patient presents with   Medication Refill   Diabetes   Hypertension    Justin Houston is 56 year old male presenting today for follow up. He has history of type 2 diabetes, diabetic retinopathy and hypertension.   Diabetes medications include: farxiga 5 mg daily, trulicity 0.10 weekly, metformin 1000 mg BID, levemir 40 units BID Today's Hemoglobin A1C= 7.9%, previously 6.7% (09/2020). He does take his blood sugars at home, but needs a new battery for the machine.   He has an upcoming podiatry appointment 01/28/2022 and dentistry appointment 08/25. He recently saw opthalmology in June and reports he is obtaining new glasses soon. He also has a colonoscopy scheduled with Dr Ardis Hughs on 12/27/2021.   Hypertension medications include: amlodipine 10 mg daily, coreg 25 mg BID, chlorthalidone 25 mg daily, hydralazine 100 TID, and atorvastatin 40 mg daily.  Todays blood pressure= 149/80. He does not have a blood pressure machine at home.   Per patient, he has had a hard time recently controlling his diet. He also does not exercise much, aside from walking at his job.   Due for screening labs, hemoglobin A1C, and microalbumin today      Patient Active Problem List   Diagnosis Date Noted   Left hip pain 10/03/2020   Varicose veins of both legs with edema 08/31/2018   GERD (gastroesophageal reflux disease) 08/16/2016   Diabetic retinopathy ([place]) 93/23/5573   Umbilical hernia without obstruction and without gangrene 09/29/2013   Glaucoma 05/26/2013   Gout 08/01/2012   BMI 45.0-49.9, adult ([place]) 08/01/2012   Hypertension 07/20/2011   Diabetes mellitus type 2 with retinopathy ([place]) 07/20/2011   Hyperlipidemia associated with type 2 diabetes mellitus ([place]) 07/20/2011   Past Medical History:  Diagnosis Date   Dermatitis    Diabetes mellitus     GERD (gastroesophageal reflux disease)    Gout    Hyperlipidemia    Hypertension    Lipoma of scalp    Mass of head, right postauricular SQ 7x4cm 09/29/2013   Obesity    Toenail fungus 08/31/2018   Past Surgical History:  Procedure Laterality Date   COLONOSCOPY WITH PROPOFOL N/A 12/01/2014   Procedure: COLONOSCOPY WITH PROPOFOL;  Surgeon: [name], MD;  Location: [facility];  Service: Endoscopy;  Laterality: N/A;   LIPOMA EXCISION N/A 01/28/2020   Procedure: EXCISION RIGHT SCALP LIPOMA;  Surgeon: Cindra Presume, MD;  Location: [facility];  Service: Plastics;  Laterality: N/A;   right arm ORIF     Social History   Tobacco Use   Smoking status: Former    Types: Cigarettes    Quit date: 09/05/1994    Years since quitting: 27.2   Smokeless tobacco: Never  Vaping Use   Vaping Use: Never used  Substance Use Topics   Alcohol use: No    Alcohol/week: 0.0 standard drinks of alcohol   Drug use: No   Family History  Problem Relation Age of Onset   Stroke Mother    Hypertension Mother    Diabetes Maternal Grandmother    Prostate cancer Maternal Grandfather    Diabetes Other    Allergies  Allergen Reactions   Losartan Potassium-Hctz Hives   Cozaar [Losartan Potassium] Hives    Hives       Review of Systems  Constitutional: Negative.   HENT:  Negative.    Eyes: Negative.   Respiratory: Negative.    Cardiovascular: Negative.   Gastrointestinal: Negative.   Genitourinary: Negative.   Musculoskeletal: Negative.   Skin: Negative.   Neurological: Negative.   Endo/Heme/Allergies: Negative.   Psychiatric/Behavioral: Negative.        Objective:     BP (!) 145/78   Pulse 73   Ht 6' (1.829 m)   Wt (!) 336 lb 6.4 oz (152.6 kg)   SpO2 96%   BMI 45.62 kg/m     Physical Exam Constitutional:      Appearance: He is obese.  HENT:     Right Ear: Tympanic membrane, ear canal and external ear normal.     Left Ear: Tympanic membrane, ear canal and  external ear normal.     Mouth/Throat:     Mouth: Mucous membranes are moist.     Pharynx: Oropharynx is clear.  Eyes:     Conjunctiva/sclera: Conjunctivae normal.  Cardiovascular:     Rate and Rhythm: Normal rate and regular rhythm.  Pulmonary:     Effort: Pulmonary effort is normal.     Breath sounds: Normal breath sounds.  Abdominal:     General: Bowel sounds are normal.     Palpations: Abdomen is soft.  Skin:    General: Skin is warm.  Neurological:     Mental Status: He is alert and oriented to person, place, and time.  Psychiatric:        Mood and Affect: Mood normal.        Behavior: Behavior normal.      Results for orders placed or performed in visit on 11/19/21  HgB A1c  Result Value Ref Range   Hemoglobin A1C     HbA1c POC (<> result, manual entry)     HbA1c, POC (prediabetic range)     HbA1c, POC (controlled diabetic range) 7.9 (A) 0.0 - 7.0 %  POCT glucose (manual entry)  Result Value Ref Range   POC Glucose 206 (A) 70 - 99 mg/dl       The 10-year ASCVD risk score (Arnett DK, et al., 2019) is: 28.4%    Assessment & Plan:   Problem List Items Addressed This Visit       Cardiovascular and Mediastinum   Hypertension    Today's blood pressure = 149/80 Will add aldactone 25 mg daily to current regimen, refills for all sent today  Lifestyle and diet handout reviewed and emphasized today  Home blood pressure machine ordered to pharmacy, recommend patient start taking daily measurements  The following Lifestyle Medicine recommendations according to [place] of Lifestyle Medicine [facility]) were discussed and offered to patient who agrees to start the journey:  A. Whole Foods, Plant-based plate comprising of fruits and vegetables, plant-based proteins, whole-grain carbohydrates was discussed in detail with the patient.   A list for source of those nutrients were also provided to the patient.  Patient will use only water or unsweetened tea for  hydration. B.  The need to stay away from risky substances including alcohol, smoking; obtaining 7 to 9 hours of restorative sleep, at least 150 minutes of moderate intensity exercise weekly, the importance of healthy social connections,  and stress reduction techniques were discussed. C.  A full color page of  Calorie density of various food groups per pound showing examples of each food groups was provided to the patient.       Relevant Medications   amLODipine (NORVASC) 10 MG tablet  atorvastatin (LIPITOR) 40 MG tablet   carvedilol (COREG) 25 MG tablet   chlorthalidone (HYGROTON) 25 MG tablet   hydrALAZINE (APRESOLINE) 100 MG tablet   spironolactone (ALDACTONE) 25 MG tablet   Other Relevant Orders   CBC with Differential/Platelet     Digestive   GERD (gastroesophageal reflux disease)    Continue omeprazole 20 mg daily       Relevant Medications   omeprazole (PRILOSEC) 20 MG capsule     Endocrine   Diabetes mellitus type 2 with retinopathy ([place]) - Primary    Today's Hemoglobin A1C- 7.9% Increase daily farxiga dose to 10 mg daily  Increase weekly dose of trulicity to 1.5, emphasized importance of staying consistent with weekly doses  Lifestyle and diet handout reviewed in depth with patient today  Recommend patient replace battery in his home blood sugar machine for adequate daily monitoring  The following Lifestyle Medicine recommendations according to [employer] of Lifestyle Medicine [facility]) were discussed and offered to patient who agrees to start the journey:  A. Whole Foods, Plant-based plate comprising of fruits and vegetables, plant-based proteins, whole-grain carbohydrates was discussed in detail with the patient.   A list for source of those nutrients were also provided to the patient.  Patient will use only water or unsweetened tea for hydration. B.  The need to stay away from risky substances including alcohol, smoking; obtaining 7 to 9 hours of restorative sleep,  at least 150 minutes of moderate intensity exercise weekly, the importance of healthy social connections,  and stress reduction techniques were discussed. C.  A full color page of  Calorie density of various food groups per pound showing examples of each food groups was provided to the patient.       Relevant Medications   atorvastatin (LIPITOR) 40 MG tablet   Dulaglutide (TRULICITY) 1.5 YC/1.4GY SOPN   dapagliflozin propanediol (FARXIGA) 5 MG TABS tablet   insulin detemir (LEVEMIR) 100 UNIT/ML injection   metFORMIN (GLUCOPHAGE) 1000 MG tablet   Other Relevant Orders   HgB A1c (Completed)   POCT glucose (manual entry) (Completed)   Comprehensive metabolic panel   Urine microalbumin-creatinine with uACR   Diabetic retinopathy ([place])    Followed by opthalmology. Continue current medication regimen      Relevant Medications   atorvastatin (LIPITOR) 40 MG tablet   Dulaglutide (TRULICITY) 1.5 JE/5.6DJ SOPN   dapagliflozin propanediol (FARXIGA) 5 MG TABS tablet   insulin detemir (LEVEMIR) 100 UNIT/ML injection   metFORMIN (GLUCOPHAGE) 1000 MG tablet   Hyperlipidemia associated with type 2 diabetes mellitus (HCC)    Continue atorvastatin 40 mg daily Lipid panel done today      Relevant Medications   amLODipine (NORVASC) 10 MG tablet   atorvastatin (LIPITOR) 40 MG tablet   carvedilol (COREG) 25 MG tablet   chlorthalidone (HYGROTON) 25 MG tablet   Dulaglutide (TRULICITY) 1.5 SH/7.57YO SOPN   dapagliflozin propanediol (FARXIGA) 5 MG TABS tablet   hydrALAZINE (APRESOLINE) 100 MG tablet   insulin detemir (LEVEMIR) 100 UNIT/ML injection   metFORMIN (GLUCOPHAGE) 1000 MG tablet   spironolactone (ALDACTONE) 25 MG tablet   Other Relevant Orders   Lipid panel     Other   BMI 45.0-49.9, adult [facility])    The following Lifestyle Medicine recommendations according to [place] of Lifestyle Medicine [facility]) were discussed and offered to patient who agrees to start the journey:  A. Whole  Foods, Plant-based plate comprising of fruits and vegetables, plant-based proteins, whole-grain carbohydrates was discussed in detail with  the patient.   A list for source of those nutrients were also provided to the patient.  Patient will use only water or unsweetened tea for hydration. B.  The need to stay away from risky substances including alcohol, smoking; obtaining 7 to 9 hours of restorative sleep, at least 150 minutes of moderate intensity exercise weekly, the importance of healthy social connections,  and stress reduction techniques were discussed. C.  A full color page of  Calorie density of various food groups per pound showing examples of each food groups was provided to the patient.       Relevant Medications   Dulaglutide (TRULICITY) 1.5 JE/5.6DJ SOPN   dapagliflozin propanediol (FARXIGA) 5 MG TABS tablet   insulin detemir (LEVEMIR) 100 UNIT/ML injection   metFORMIN (GLUCOPHAGE) 1000 MG tablet   Left hip pain    Improved with use of meloxicam as needed, refills sent today      Other Visit Diagnoses     Essential hypertension       Relevant Medications   amLODipine (NORVASC) 10 MG tablet   atorvastatin (LIPITOR) 40 MG tablet   carvedilol (COREG) 25 MG tablet   chlorthalidone (HYGROTON) 25 MG tablet   hydrALAZINE (APRESOLINE) 100 MG tablet   spironolactone (ALDACTONE) 25 MG tablet   Hyperlipidemia, unspecified hyperlipidemia type       Relevant Medications   amLODipine (NORVASC) 10 MG tablet   atorvastatin (LIPITOR) 40 MG tablet   carvedilol (COREG) 25 MG tablet   chlorthalidone (HYGROTON) 25 MG tablet   hydrALAZINE (APRESOLINE) 100 MG tablet   spironolactone (ALDACTONE) 25 MG tablet   Diabetes mellitus type 2 in obese (HCC)       Relevant Medications   atorvastatin (LIPITOR) 40 MG tablet   Dulaglutide (TRULICITY) 1.5 SH/7.57YO SOPN   dapagliflozin propanediol (FARXIGA) 5 MG TABS tablet   insulin detemir (LEVEMIR) 100 UNIT/ML injection   metFORMIN (GLUCOPHAGE) 1000  MG tablet     38 minutes spent extra time needed for patient education multisystems assessed  Return in about 6 weeks (around 12/31/2021) for htn, diabetes.    Asencion Noble, MD

## 2021-11-19 NOTE — Assessment & Plan Note (Signed)
Followed by opthalmology. Continue current medication regimen

## 2021-11-19 NOTE — Assessment & Plan Note (Addendum)
Today's blood pressure = 149/80 Will add aldactone 25 mg daily to current regimen, refills for all sent today  Lifestyle and diet handout reviewed and emphasized today  Home blood pressure machine ordered to pharmacy, recommend patient start taking daily measurements  The following Lifestyle Medicine recommendations according to [place] [facility]) were discussed and offered to patient who agrees to start the journey:  A. Whole Foods, Plant-based plate comprising of fruits and vegetables, plant-based proteins, whole-grain carbohydrates was discussed in detail with the patient.   A list for source of those nutrients were also provided to the patient.  Patient will use only water or unsweetened tea for hydration. B.  The need to stay away from risky substances including alcohol, smoking; obtaining 7 to 9 hours of restorative sleep, at least 150 minutes of moderate intensity exercise weekly, the importance of healthy social connections,  and stress reduction techniques were discussed. C.  A full color page of  Calorie density of various food groups per pound showing examples of each food groups was provided to the patient.

## 2021-11-19 NOTE — Assessment & Plan Note (Signed)

## 2021-11-19 NOTE — Patient Instructions (Addendum)
Start Aldactone 1 pill daily 25 mg  Increase Farxiga to 10 mg daily can take 2 of the 5 mg daily but the refill will be a single 10 mg daily  Trulicity will be increased to 1.5 mg weekly when you get your refill that will be the new dose she may finish the current pens that you have  No change in all of your other medications [facility]  Follow the lifestyle medicine handout that we went over with you at the visit  Complete screening labs will be obtained and urine for microalbumin obtained  Return to see Dr. Joya Gaskins in 6 weeks  [facility]

## 2021-11-20 LAB — LIPID PANEL
Chol/HDL Ratio: 4.2 ratio (ref 0.0–5.0)
Cholesterol, Total: 179 mg/dL (ref 100–199)
HDL: 43 mg/dL (ref >39)
LDL Chol Calc (NIH): 116 mg/dL — ABNORMAL HIGH (ref 0–99)
Triglycerides: 113 mg/dL (ref 0–149)
VLDL Cholesterol Cal: 20 mg/dL (ref 5–40)

## 2021-11-20 LAB — COMPREHENSIVE METABOLIC PANEL
ALT: 16 IU/L (ref 0–44)
AST: 20 IU/L (ref 0–40)
Albumin/Globulin Ratio: 1.4 (ref 1.2–2.2)
Albumin: 4.3 g/dL (ref 3.8–4.9)
Alkaline Phosphatase: 109 IU/L (ref 44–121)
BUN/Creatinine Ratio: 17 (ref 9–20)
BUN: 22 mg/dL (ref 6–24)
Bilirubin Total: 1 mg/dL (ref 0.0–1.2)
CO2: 21 mmol/L (ref 20–29)
Calcium: 9.8 mg/dL (ref 8.7–10.2)
Chloride: 101 mmol/L (ref 96–106)
Creatinine, Ser: 1.3 mg/dL — ABNORMAL HIGH (ref 0.76–1.27)
Globulin, Total: 3 g/dL (ref 1.5–4.5)
Glucose: 196 mg/dL — ABNORMAL HIGH (ref 70–99)
Potassium: 4.1 mmol/L (ref 3.5–5.2)
Sodium: 141 mmol/L (ref 134–144)
Total Protein: 7.3 g/dL (ref 6.0–8.5)
eGFR: 64 mL/min/{1.73_m2} (ref >59)

## 2021-11-20 LAB — CBC WITH DIFFERENTIAL/PLATELET
Basophils Absolute: 0 10*3/uL (ref 0.0–0.2)
Basos: 1 %
EOS (ABSOLUTE): 0.1 10*3/uL (ref 0.0–0.4)
Eos: 2 %
Hematocrit: 47.4 % (ref 37.5–51.0)
Hemoglobin: 16 g/dL (ref 13.0–17.7)
Immature Grans (Abs): 0 10*3/uL (ref 0.0–0.1)
Immature Granulocytes: 0 %
Lymphocytes Absolute: 1.9 10*3/uL (ref 0.7–3.1)
Lymphs: 30 %
MCH: 30.5 pg (ref 26.6–33.0)
MCHC: 33.8 g/dL (ref 31.5–35.7)
MCV: 90 fL (ref 79–97)
Monocytes Absolute: 0.5 10*3/uL (ref 0.1–0.9)
Monocytes: 9 %
Neutrophils Absolute: 3.6 10*3/uL (ref 1.4–7.0)
Neutrophils: 58 %
Platelets: 181 10*3/uL (ref 150–450)
RBC: 5.25 x10E6/uL (ref 4.14–5.80)
RDW: 13.3 % (ref 11.6–15.4)
WBC: 6.2 10*3/uL (ref 3.4–10.8)

## 2021-11-20 LAB — MICROALBUMIN / CREATININE URINE RATIO
Creatinine, Urine: 98.7 mg/dL
Microalb/Creat Ratio: 68 mg/g creat — ABNORMAL HIGH (ref 0–29)
Microalbumin, Urine: 67 ug/mL

## 2021-11-20 NOTE — Progress Notes (Signed)
Let pt know kidney stable, liver normal, cholesterol is high, protein in urine is better showing improvement in diabetes, blood counts normal, take the cholesterol pill atorvastatin at '40mg'$  daily and follow the lifestyle diet we discussed

## 2021-11-21 ENCOUNTER — Telehealth: Payer: Self-pay

## 2021-11-21 NOTE — Telephone Encounter (Signed)
-----   Message from [name], MD sent at 11/20/2021  5:18 PM EDT ----- Let pt know kidney stable, liver normal, cholesterol is high, protein in urine is better showing improvement in diabetes, blood counts normal, take the cholesterol pill atorvastatin at '40mg'$  daily and follow the lifestyle diet we discussed

## 2021-11-21 NOTE — Telephone Encounter (Signed)
Pt was called and is aware of results, DOB was confirmed.  ?

## 2021-11-21 NOTE — Telephone Encounter (Signed)
Pt was called and vm was left, Information has been sent to nurse pool.     *Disregard last message, made in error*

## 2021-11-23 ENCOUNTER — Telehealth: Payer: Self-pay

## 2021-11-23 NOTE — Telephone Encounter (Signed)
Procedure with Dr Ardis Hughs has been moved to 12/20/21 at 70 am with Dr Fuller Plan at [facility]    Left message on machine to call back

## 2021-11-26 ENCOUNTER — Other Ambulatory Visit: Payer: Self-pay

## 2021-11-26 MED ORDER — PEG 3350-KCL-NA BICARB-NACL 420 G PO SOLR: 4000.0000 mL | Freq: Once | ORAL | 0 refills | Status: AC

## 2021-11-26 NOTE — Telephone Encounter (Signed)
Left message on machine to call back  

## 2021-11-26 NOTE — Telephone Encounter (Signed)
Colon scheduled, pt instructed and medications reviewed.  Patient instructions mailed to home.  Patient to call with any questions or concerns.  

## 2021-12-12 ENCOUNTER — Encounter (HOSPITAL_COMMUNITY): Payer: Self-pay | Admitting: Gastroenterology

## 2021-12-12 NOTE — Progress Notes (Signed)
Attempted to obtain medical history via telephone, unable to reach at this time. HIPAA compliant voicemail message left requesting return call to pre surgical testing department. 

## 2021-12-19 NOTE — Anesthesia Preprocedure Evaluation (Addendum)
Anesthesia Evaluation  Patient identified by MRN, date of birth, ID band Patient awake    Reviewed: Allergy & Precautions, NPO status , Patient's Chart, lab work & pertinent test results, reviewed documented beta blocker date and time   Airway Mallampati: I  TM Distance: >3 FB Neck ROM: Full    Dental no notable dental hx. (+) Teeth Intact, Missing, Dental Advisory Given,    Pulmonary neg pulmonary ROS, former smoker,    Pulmonary exam normal breath sounds clear to auscultation       Cardiovascular hypertension, Pt. on medications and Pt. on home beta blockers negative cardio ROS Normal cardiovascular exam Rhythm:Regular Rate:Normal     Neuro/Psych negative neurological ROS  negative psych ROS   GI/Hepatic negative GI ROS, Neg liver ROS, GERD  Medicated,  Endo/Other  negative endocrine ROSdiabetes, Type 2, Oral Hypoglycemic Agents, Insulin DependentMorbid obesity  Renal/GU negative Renal ROS  negative genitourinary   Musculoskeletal negative musculoskeletal ROS (+)   Abdominal   Peds negative pediatric ROS (+)  Hematology negative hematology ROS (+)   Anesthesia Other Findings   Reproductive/Obstetrics negative OB ROS                            Anesthesia Physical  Anesthesia Plan  ASA: 3  Anesthesia Plan: MAC   Post-op Pain Management: Minimal or no pain anticipated   Induction: Intravenous  PONV Risk Score and Plan: 2 and Propofol infusion  Airway Management Planned: Natural Airway and Simple Face Mask  Additional Equipment: None  Intra-op Plan:   Post-operative Plan:   Informed Consent: I have reviewed the patients History and Physical, chart, labs and discussed the procedure including the risks, benefits and alternatives for the proposed anesthesia with the patient or authorized representative who has indicated his/her understanding and acceptance.       Plan  Discussed with: CRNA and Anesthesiologist  Anesthesia Plan Comments:        Anesthesia Quick Evaluation

## 2021-12-20 ENCOUNTER — Encounter (HOSPITAL_COMMUNITY): Payer: Self-pay | Admitting: Gastroenterology

## 2021-12-20 ENCOUNTER — Ambulatory Visit (HOSPITAL_COMMUNITY): Payer: BC Managed Care – PPO | Admitting: Anesthesiology

## 2021-12-20 ENCOUNTER — Encounter (HOSPITAL_COMMUNITY)
Admission: RE | Disposition: A | Discharge status: Home / Self Care | Payer: Self-pay | Source: Home / Self Care | Attending: Gastroenterology

## 2021-12-20 ENCOUNTER — Other Ambulatory Visit: Payer: Self-pay

## 2021-12-20 ENCOUNTER — Ambulatory Visit (HOSPITAL_COMMUNITY)
Admission: RE | Admit: 2021-12-20 | Discharge: 2021-12-20 | Disposition: A | Discharge status: Home / Self Care | Payer: BC Managed Care – PPO | Attending: Gastroenterology | Admitting: Gastroenterology

## 2021-12-20 DIAGNOSIS — K621 Rectal polyp: Secondary | ICD-10-CM | POA: Insufficient documentation

## 2021-12-20 DIAGNOSIS — E661 Drug-induced obesity: Secondary | ICD-10-CM | POA: Insufficient documentation

## 2021-12-20 DIAGNOSIS — Z794 Long term (current) use of insulin: Secondary | ICD-10-CM | POA: Diagnosis not present

## 2021-12-20 DIAGNOSIS — K5730 Diverticulosis of large intestine without perforation or abscess without bleeding: Secondary | ICD-10-CM | POA: Insufficient documentation

## 2021-12-20 DIAGNOSIS — Z860100 Personal history of colon polyps, unspecified: Secondary | ICD-10-CM

## 2021-12-20 DIAGNOSIS — D128 Benign neoplasm of rectum: Secondary | ICD-10-CM | POA: Diagnosis not present

## 2021-12-20 DIAGNOSIS — Z87891 Personal history of nicotine dependence: Secondary | ICD-10-CM | POA: Diagnosis not present

## 2021-12-20 DIAGNOSIS — Z6841 Body Mass Index (BMI) 40.0 and over, adult: Secondary | ICD-10-CM | POA: Insufficient documentation

## 2021-12-20 DIAGNOSIS — Z7984 Long term (current) use of oral hypoglycemic drugs: Secondary | ICD-10-CM | POA: Insufficient documentation

## 2021-12-20 DIAGNOSIS — I10 Essential (primary) hypertension: Secondary | ICD-10-CM | POA: Diagnosis not present

## 2021-12-20 DIAGNOSIS — D123 Benign neoplasm of transverse colon: Secondary | ICD-10-CM | POA: Diagnosis not present

## 2021-12-20 DIAGNOSIS — K640 First degree hemorrhoids: Secondary | ICD-10-CM | POA: Diagnosis not present

## 2021-12-20 DIAGNOSIS — Z79899 Other long term (current) drug therapy: Secondary | ICD-10-CM | POA: Diagnosis not present

## 2021-12-20 DIAGNOSIS — K219 Gastro-esophageal reflux disease without esophagitis: Secondary | ICD-10-CM | POA: Diagnosis not present

## 2021-12-20 DIAGNOSIS — Z1211 Encounter for screening for malignant neoplasm of colon: Secondary | ICD-10-CM | POA: Insufficient documentation

## 2021-12-20 DIAGNOSIS — Z09 Encounter for follow-up examination after completed treatment for conditions other than malignant neoplasm: Secondary | ICD-10-CM

## 2021-12-20 DIAGNOSIS — E119 Type 2 diabetes mellitus without complications: Secondary | ICD-10-CM | POA: Diagnosis not present

## 2021-12-20 HISTORY — PX: POLYPECTOMY: SHX5525

## 2021-12-20 HISTORY — PX: COLONOSCOPY WITH PROPOFOL: SHX5780

## 2021-12-20 LAB — GLUCOSE, CAPILLARY
Glucose-Capillary: 65 mg/dL — ABNORMAL LOW (ref 70–99)
Glucose-Capillary: 71 mg/dL (ref 70–99)

## 2021-12-20 SURGERY — COLONOSCOPY WITH PROPOFOL
Anesthesia: Monitor Anesthesia Care

## 2021-12-20 MED ORDER — SODIUM CHLORIDE 0.9 % IV SOLN: INTRAVENOUS | Status: DC

## 2021-12-20 MED ORDER — LACTATED RINGERS IV SOLN: INTRAVENOUS | Status: DC

## 2021-12-20 MED ORDER — PROPOFOL 10 MG/ML IV BOLUS
INTRAVENOUS | Status: DC | PRN
  Administered 2021-12-20 (×2): 20 mg via INTRAVENOUS

## 2021-12-20 MED ORDER — PROPOFOL 500 MG/50ML IV EMUL
INTRAVENOUS | Status: DC | PRN
  Administered 2021-12-20: 135 ug/kg/min via INTRAVENOUS

## 2021-12-20 MED ORDER — PROPOFOL 1000 MG/100ML IV EMUL
INTRAVENOUS | Status: AC
  Filled 2021-12-20: qty 100

## 2021-12-20 SURGICAL SUPPLY — 22 items

## 2021-12-20 NOTE — Op Note (Addendum)
[facility] Patient Name: Justin Houston Procedure Date: 12/20/2021 MRN: [id number] Attending MD: Ladene Artist , MD Date of Birth: April 07, 1966 CSN: [id number] Age: 56 Admit Type: Outpatient Procedure:                Colonoscopy Indications:              Surveillance: Personal history of adenomatous                            polyps on last colonoscopy > 5 years ago Providers:                Pricilla Riffle. Fuller Plan, MD, Carlyn Reichert, RN, William Dalton, Technician Referring MD:             Asencion Noble, MD Medicines:                Monitored Anesthesia Care Complications:            No immediate complications. Estimated blood loss:                            None. Estimated Blood Loss:     Estimated blood loss: none. Procedure:                Pre-Anesthesia Assessment:                           - Prior to the procedure, a History and Physical                            was performed, and patient medications and                            allergies were reviewed. The patient's tolerance of                            previous anesthesia was also reviewed. The risks                            and benefits of the procedure and the sedation                            options and risks were discussed with the patient.                            All questions were answered, and informed consent                            was obtained. Prior Anticoagulants: The patient has                            taken no previous anticoagulant or antiplatelet  agents. ASA Grade Assessment: III - A patient with                            severe systemic disease. After reviewing the risks                            and benefits, the patient was deemed in                            satisfactory condition to undergo the procedure.                           After obtaining informed consent, the colonoscope                            was passed  under direct vision. Throughout the                            procedure, the patient's blood pressure, pulse, and                            oxygen saturations were monitored continuously. The                            PCF-HQ190L ([id number]) Olympus colonoscope was                            introduced through the anus and advanced to the the                            cecum, identified by appendiceal orifice and                            ileocecal valve. The ileocecal valve, appendiceal                            orifice, and rectum were photographed. The quality                            of the bowel preparation was good. The colonoscopy                            was performed without difficulty. The patient                            tolerated the procedure well. Scope In: 8:33:20 AM Scope Out: 8:57:21 AM Scope Withdrawal Time: 0 hours 20 minutes 45 seconds  Total Procedure Duration: 0 hours 24 minutes 1 second  Findings:      The perianal and digital rectal examinations were normal.      Five sessile polyps were found in the rectum (2) and transverse colon       (3). The polyps were 6 to 8 mm in size. These polyps were removed with a       cold snare.  Resection and retrieval were complete.      A few small-mouthed diverticula were found in the left colon. There was       no evidence of diverticular bleeding.      Internal hemorrhoids were found during retroflexion. The hemorrhoids       were moderate and Grade I (internal hemorrhoids that do not prolapse).      The exam was otherwise without abnormality on direct and retroflexion       views. Impression:               - Five 6 to 8 mm polyps in the rectum and in the                            transverse colon, removed with a cold snare.                            Resected and retrieved.                           - Mild diverticulosis in the left colon.                           - Internal hemorrhoids. Moderate Sedation:       Not Applicable - Patient had care per Anesthesia. Recommendation:           - Repeat colonoscopy after studies are complete for                            surveillance based on pathology results with Dr.                            Ardis Hughs.                           - Patient has a contact number available for                            emergencies. The signs and symptoms of potential                            delayed complications were discussed with the                            patient. Return to normal activities tomorrow.                            Written discharge instructions were provided to the                            patient.                           - High fiber diet.                           - Continue present medications.                           -  Await pathology results. Procedure Code(s):        --- Professional ---                           [phone], Colonoscopy, flexible; with removal of                            tumor(s), polyp(s), or other lesion(s) by snare                            technique Diagnosis Code(s):        --- Professional ---                           Z86.010, Personal history of colonic polyps                           K62.1, Rectal polyp                           K63.5, Polyp of colon                           K64.0, First degree hemorrhoids                           K57.30, Diverticulosis of large intestine without                            perforation or abscess without bleeding CPT copyright 2019 American Medical Association. All rights reserved. The codes documented in this report are preliminary and upon coder review may  be revised to meet current compliance requirements. Ladene Artist, MD 12/20/2021 9:20:11 AM This report has been signed electronically. Number of Addenda: 0

## 2021-12-20 NOTE — Transfer of Care (Signed)
Immediate Anesthesia Transfer of Care Note  Patient: Justin Houston  Procedure(s) Performed: COLONOSCOPY WITH PROPOFOL POLYPECTOMY  Patient Location: PACU  Anesthesia Type:MAC  Level of Consciousness: drowsy  Airway & Oxygen Therapy: Patient Spontanous Breathing and Patient connected to face mask oxygen  Post-op Assessment: Report given to RN, Post -op Vital signs reviewed and stable and Patient moving all extremities X 4  Post vital signs: Reviewed and stable  Last Vitals:  Vitals Value Taken Time  BP 145/95   Temp    Pulse 75   Resp    SpO2 99     Last Pain:  Vitals:   12/20/21 0753  TempSrc: Temporal  PainSc: 0-No pain         Complications: No notable events documented.

## 2021-12-20 NOTE — Anesthesia Postprocedure Evaluation (Signed)
Anesthesia Post Note  Patient: Justin Houston  Procedure(s) Performed: COLONOSCOPY WITH PROPOFOL POLYPECTOMY     Patient location during evaluation: PACU Anesthesia Type: MAC Level of consciousness: awake and alert Pain management: pain level controlled Vital Signs Assessment: post-procedure vital signs reviewed and stable Respiratory status: spontaneous breathing, nonlabored ventilation, respiratory function stable and patient connected to nasal cannula oxygen Cardiovascular status: stable and blood pressure returned to baseline Postop Assessment: no apparent nausea or vomiting Anesthetic complications: no   No notable events documented.  Last Vitals:  Vitals:   12/20/21 0928 12/20/21 0930  BP: (!) 164/102 (!) 164/93  Pulse: 71 69  Resp: 15 16  Temp:    SpO2: 97% 96%    Last Pain:  Vitals:   12/20/21 0930  TempSrc:   PainSc: 0-No pain                 Talaysha Freeberg

## 2021-12-20 NOTE — Discharge Instructions (Signed)
YOU HAD AN ENDOSCOPIC PROCEDURE TODAY: Refer to the procedure report and other information in the discharge instructions given to you for any specific questions about what was found during the examination. If this information does not answer your questions, please call [place] office at [phone] to clarify.  ° °YOU SHOULD EXPECT: Some feelings of bloating in the abdomen. Passage of more gas than usual. Walking can help get rid of the air that was put into your GI tract during the procedure and reduce the bloating. If you had a lower endoscopy (such as a colonoscopy or flexible sigmoidoscopy) you may notice spotting of blood in your stool or on the toilet paper. Some abdominal soreness may be present for a day or two, also. ° °DIET: Your first meal following the procedure should be a light meal and then it is ok to progress to your normal diet. A half-sandwich or bowl of soup is an example of a good first meal. Heavy or fried foods are harder to digest and may make you feel nauseous or bloated. Drink plenty of fluids but you should avoid alcoholic beverages for 24 hours. If you had a esophageal dilation, please see attached instructions for diet.   ° °ACTIVITY: Your care partner should take you home directly after the procedure. You should plan to take it easy, moving slowly for the rest of the day. You can resume normal activity the day after the procedure however YOU SHOULD NOT DRIVE, use power tools, machinery or perform tasks that involve climbing or major physical exertion for 24 hours (because of the sedation medicines used during the test).  ° °SYMPTOMS TO REPORT IMMEDIATELY: °A gastroenterologist can be reached at any hour. Please call [phone]  for any of the following symptoms:  °Following lower endoscopy (colonoscopy, flexible sigmoidoscopy) °Excessive amounts of blood in the stool  °Significant tenderness, worsening of abdominal pains  °Swelling of the abdomen that is new, acute  °Fever of 100° or  higher  °Following upper endoscopy (EGD, EUS, ERCP, esophageal dilation) °Vomiting of blood or coffee ground material  °New, significant abdominal pain  °New, significant chest pain or pain under the shoulder blades  °Painful or persistently difficult swallowing  °New shortness of breath  °Black, tarry-looking or red, bloody stools ° °FOLLOW UP:  °If any biopsies were taken you will be contacted by phone or by letter within the next 1-3 weeks. Call [phone]  if you have not heard about the biopsies in 3 weeks.  °Please also call with any specific questions about appointments or follow up tests. ° °

## 2021-12-20 NOTE — H&P (Signed)
History & Physical  Primary Care Physician:  [name], MD Primary Gastroenterologist: Oretha Caprice, MD  CHIEF COMPLAINT:  Personal history of colon polyps   HPI: Justin Houston is a 56 y.o. male with a personal history of adenomatous colon polyps for surveillance colonoscopy.   Past Medical History:  Diagnosis Date   Dermatitis    Diabetes mellitus    GERD (gastroesophageal reflux disease)    Gout    Hyperlipidemia    Hypertension    Lipoma of scalp    Mass of head, right postauricular SQ 7x4cm 09/29/2013   Obesity    Toenail fungus 08/31/2018    Past Surgical History:  Procedure Laterality Date   COLONOSCOPY WITH PROPOFOL N/A 12/01/2014   Procedure: COLONOSCOPY WITH PROPOFOL;  Surgeon: [name], MD;  Location: [facility];  Service: Endoscopy;  Laterality: N/A;   LIPOMA EXCISION N/A 01/28/2020   Procedure: EXCISION RIGHT SCALP LIPOMA;  Surgeon: Cindra Presume, MD;  Location: [place];  Service: Plastics;  Laterality: N/A;   right arm ORIF      Prior to Admission medications   Medication Sig Start Date End Date Taking? Authorizing Provider  ALPHAGAN P 0.1 % SOLN Place 1 drop into both eyes 2 (two) times daily. 10/05/19  Yes [name], MD  amLODipine (NORVASC) 10 MG tablet Take 1 tablet (10 mg total) by mouth daily. 11/19/21  Yes [name], MD  atorvastatin (LIPITOR) 40 MG tablet Take 1 tablet (40 mg total) by mouth daily. 11/19/21  Yes [name], MD  carvedilol (COREG) 25 MG tablet TAKE 1 TABLET BY MOUTH 2 TIMES DAILY WITH MEAL 11/19/21  Yes [name], MD  chlorthalidone (HYGROTON) 25 MG tablet Take 1 tablet (25 mg total) by mouth daily. Must keep upcoming appt for additional refills. 11/19/21  Yes [name], MD  clobetasol ointment (TEMOVATE) 7.62 % Apply 1 application topically 3 (three) times daily as needed (rash.). 03/14/20  Yes [name], MD  dapagliflozin propanediol (FARXIGA) 5 MG TABS tablet  Take 2 tablets (10 mg total) by mouth daily. Patient taking differently: Take 5 mg by mouth 2 (two) times daily. 11/19/21  Yes [name], MD  Dulaglutide (TRULICITY) 1.5 GB/1.5VV SOPN Inject 1.5 mg into the skin once a week. Patient taking differently: Inject 0.75 mg into the skin once a week. 11/19/21  Yes [name], MD  GLUCOSAMINE HCL PO Take 2,000 mg by mouth daily. With Vitamin D   Yes [provider]  hydrALAZINE (APRESOLINE) 100 MG tablet Take 1 tablet (100 mg total) by mouth 3 (three) times daily. 11/19/21  Yes [name], MD  insulin detemir (LEVEMIR) 100 UNIT/ML injection INJECT 40 UNITS SUBCUTANEOUSLY 2 TIMES DAILY 11/19/21  Yes [name], MD  Insulin Syringe-Needle U-100 (BD INSULIN SYRINGE U/F) 31G X 5/16" 1 ML MISC USE TO INJECT INSULIN 2 TIMES DAILY AS DIRECTED 11/19/21  Yes [name], MD  latanoprost (XALATAN) 0.005 % ophthalmic solution Place 1 drop into both eyes at bedtime. 10/01/21  Yes [provider]  meloxicam (MOBIC) 15 MG tablet Take 1 tablet (15 mg total) by mouth daily. 11/19/21  Yes [name], MD  metFORMIN (GLUCOPHAGE) 1000 MG tablet Take 1 tablet (1,000 mg total) by mouth 2 (two) times daily with a meal. 11/19/21  Yes [name], MD  omeprazole (PRILOSEC) 20 MG capsule Take 1 capsule (20 mg total) by mouth daily.  11/19/21  Yes [name], MD  spironolactone (ALDACTONE) 25 MG tablet Take 1 tablet (25 mg total) by mouth daily. 11/19/21  Yes [name], MD  STIMULANT LAXATIVE 8.6-50 MG tablet TAKE 1 TABLET BY MOUTH DAILY 05/21/19  Yes [name], MD  Blood Glucose Monitoring Suppl (ONETOUCH VERIO) w/Device KIT Use as instructed to check blood sugar 3 times daily. W10.272 Z79.4 11/03/18   [name], MD  Blood Pressure Monitoring (BLOOD PRESSURE KIT) DEVI Use to measure blood pressure 11/19/21   [name], MD  glucose blood [facility] VERIO) test strip USE AS INSTRUCTED TO CHECK BLOOD SUGAR 3  TIMES DAILY 01/01/21   [name], MD  OneTouch Delica Lancets 53G MISC Use as instructed to check blood sugar 3 times daily. U44.034 Z79.4 01/01/21   [name], MD    Current Facility-Administered Medications  Medication Dose Route Frequency Provider Last Rate Last Admin   0.9 %  sodium chloride infusion   Intravenous Continuous Ladene Artist, MD       lactated ringers infusion   Intravenous Continuous Ladene Artist, MD        Allergies as of 10/22/2021 - Review Complete 10/01/2021  Allergen Reaction Noted   Losartan potassium-hctz Hives 07/20/2011   Cozaar [losartan potassium] Hives 08/31/2018    Family History  Problem Relation Age of Onset   Stroke Mother    Hypertension Mother    Diabetes Maternal Grandmother    Prostate cancer Maternal Grandfather    Diabetes Other     Social History   Socioeconomic History   Marital status: Single    Spouse name: n/a   Number of children: 0   Years of education: 18   Highest education level: Not on file  Occupational History   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  Lives alone.   No siblings.   Social Determinants of Health   Financial Resource Strain: Not on file  Food Insecurity: Not on file  Transportation Needs: Not on file  Physical Activity: Not on file  Stress: Not on file  Social Connections: Not on file  Intimate Partner Violence: Not on file    Review of Systems:  All systems reviewed were negative except where noted in HPI.   Physical  Exam: Vital signs in last 24 hours: Temp:  [97.7 F (36.5 C)] 97.7 F (36.5 C) (09/07 0753) Pulse Rate:  [74] 74 (09/07 0753) Resp:  [19] 19 (09/07 0753) BP: (177)/(101) 177/101 (09/07 0753) SpO2:  [95 %] 95 % (09/07 0753) Weight:  [152.4 kg] 152.4 kg (09/07 0753)   General:  Alert, well-developed, in NAD Head:  Normocephalic and atraumatic. Eyes:  Sclera clear, no icterus.   Conjunctiva pink. Ears:  Normal auditory acuity. Mouth:  No deformity or lesions.  Neck:  Supple; no masses . Lungs:  Clear throughout to auscultation.   No wheezes, crackles, or rhonchi. No acute distress. Heart:  Regular rate and rhythm; no murmurs. Abdomen:  Soft, nondistended, nontender. No masses, hepatomegaly. No obvious masses.  Normal bowel .    Rectal:  Deferred   Msk:  Symmetrical without gross deformities.. Pulses:  Normal pulses noted. Extremities:  Without edema. Neurologic:  Alert and  oriented x4;  grossly normal neurologically. Skin:  Intact without significant lesions or rashes. Cervical Nodes:  No significant cervical adenopathy. Psych:  Alert and cooperative. Normal mood and affect.   Impression / Plan:   Personal history of adenomatous colon polyps for surveillance colonoscopy.  Pricilla Riffle. Fuller Plan  12/20/2021, 8:22 AM See Shea Evans, [facility], to contact our on call provider

## 2021-12-20 NOTE — Anesthesia Procedure Notes (Signed)
Procedure Name: MAC Date/Time: 12/20/2021 8:28 AM  Performed by: Niel Hummer, CRNAPre-anesthesia Checklist: Patient identified, Emergency Drugs available, Suction available and Patient being monitored Oxygen Delivery Method: Simple face mask

## 2021-12-21 ENCOUNTER — Encounter: Payer: Self-pay | Admitting: Gastroenterology

## 2021-12-21 LAB — SURGICAL PATHOLOGY

## 2021-12-22 ENCOUNTER — Encounter (HOSPITAL_COMMUNITY): Payer: Self-pay | Admitting: Gastroenterology

## 2022-01-02 ENCOUNTER — Ambulatory Visit: Payer: BC Managed Care – PPO | Admitting: Critical Care Medicine

## 2022-01-10 ENCOUNTER — Other Ambulatory Visit: Payer: Self-pay | Admitting: Critical Care Medicine

## 2022-01-10 MED ORDER — MELOXICAM 15 MG PO TABS: 15.0000 mg | ORAL_TABLET | Freq: Every day | ORAL | 0 refills | Status: DC

## 2022-01-10 NOTE — Telephone Encounter (Signed)
Medication Refill - Medication:   Mobic  generic  Has the patient contacted their pharmacy? Yes.   (Agent: If no, request that the patient contact the pharmacy for the refill. If patient does not wish to contact the pharmacy document the reason why and proceed with request.) (Agent: If yes, when and what did the pharmacy advise?)  Preferred Pharmacy (with phone number or street name): [facility] on [address]  Has the patient been seen for an appointment in the last year OR does the patient have an upcoming appointment? Yes.    Agent: Please be advised that RX refills may take up to 3 business days. We ask that you follow-up with your pharmacy.

## 2022-01-10 NOTE — Telephone Encounter (Signed)
Requested Prescriptions  Pending Prescriptions Disp Refills  . meloxicam (MOBIC) 15 MG tablet 30 tablet 0    Sig: Take 1 tablet (15 mg total) by mouth daily.     Analgesics:  COX2 Inhibitors Failed - 01/10/2022  2:16 PM      Failed - Manual Review: Labs are only required if the patient has taken medication for more than 8 weeks.      Failed - Cr in normal range and within 360 days    Creat  Date Value Ref Range Status  01/16/2016 0.95 0.70 - 1.33 mg/dL Final    Comment:      For patients > or = 57 years of age: The upper reference limit for Creatinine is approximately 13% higher for people identified as African-American.      Creatinine, Ser  Date Value Ref Range Status  11/19/2021 1.30 (H) 0.76 - 1.27 mg/dL Final   Creatinine, Urine  Date Value Ref Range Status  08/12/2010 201.3 mg/dL Final         Passed - HGB in normal range and within 360 days    Hemoglobin  Date Value Ref Range Status  11/19/2021 16.0 13.0 - 17.7 g/dL Final         Passed - HCT in normal range and within 360 days    Hematocrit  Date Value Ref Range Status  11/19/2021 47.4 37.5 - 51.0 % Final         Passed - AST in normal range and within 360 days    AST  Date Value Ref Range Status  11/19/2021 20 0 - 40 IU/L Final         Passed - ALT in normal range and within 360 days    ALT  Date Value Ref Range Status  11/19/2021 16 0 - 44 IU/L Final         Passed - eGFR is 30 or above and within 360 days    GFR calc Af Amer  Date Value Ref Range Status  11/09/2019 75 >59 mL/min/1.73 Final    Comment:    **[facility] currently reports eGFR in compliance with the current**   recommendations of the [employer]. [facility] will   update reporting as new guidelines are published from the NKF-ASN   Task force.    GFR, Estimated  Date Value Ref Range Status  01/26/2020 >60 >60 mL/min Final   eGFR  Date Value Ref Range Status  11/19/2021 64 >59 mL/min/1.73 Final         Passed -  Patient is not pregnant      Passed - Valid encounter within last 12 months    Recent Outpatient Visits          1 month ago Type 2 diabetes mellitus with retinopathy, with long-term current use of insulin, macular edema presence unspecified, unspecified laterality, unspecified retinopathy severity ([place])   [place] [name], MD   4 months ago No-show for appointment   [place] [name], MD   11 months ago [place], [place] W, NP   1 year ago Type 2 diabetes mellitus with retinopathy, with long-term current use of insulin, macular edema presence unspecified, unspecified laterality, unspecified retinopathy severity [facility])   [place], [place] E, MD   1 year ago Left hip pain   [place]  [name], MD      Future Appointments            In 2 months [name], MD [place]

## 2022-01-14 ENCOUNTER — Ambulatory Visit: Payer: Self-pay

## 2022-01-14 NOTE — Telephone Encounter (Signed)
Pt is calling with cold like sx. No available appts. Pt would like to know is there any other options within [place] that he can be evaluated? Please advise     Chief Complaint: Nasal congestion, scratchy throat. Asking to be worked in today. Declines [facility]. Symptoms: Above Frequency: Yesterday Pertinent Negatives: Patient denies fever Disposition: '[]'$ ED /'[]'$ Urgent Care (no appt availability in office) / '[]'$ Appointment(In office/virtual)/ '[]'$  [facility]/ '[]'$ Home Care/ '[]'$ Refused Recommended Disposition /'[]'$ [facility]/ '[x]'$  Follow-up with PCP Additional Notes: Please advise pt.  Answer Assessment - Initial Assessment Questions 1. ONSET: "When did the cough begin?"      Saturday 2. SEVERITY: "How bad is the cough today?"      Moderate  3. SPUTUM: "Describe the color of your sputum" (none, dry cough; clear, white, yellow, green)     Clear 4. HEMOPTYSIS: "Are you coughing up any blood?" If so ask: "How much?" (flecks, streaks, tablespoons, etc.)     No 5. DIFFICULTY BREATHING: "Are you having difficulty breathing?" If Yes, ask: "How bad is it?" (e.g., mild, moderate, severe)    - MILD: No SOB at rest, mild SOB with walking, speaks normally in sentences, can lie down, no retractions, pulse < 100.    - MODERATE: SOB at rest, SOB with minimal exertion and prefers to sit, cannot lie down flat, speaks in phrases, mild retractions, audible wheezing, pulse 100-120.    - SEVERE: Very SOB at rest, speaks in single words, struggling to breathe, sitting hunched forward, retractions, pulse > 120      No 6. FEVER: "Do you have a fever?" If Yes, ask: "What is your temperature, how was it measured, and when did it start?"     No 7. CARDIAC HISTORY: "Do you have any history of heart disease?" (e.g., heart attack, congestive heart failure)      No 8. LUNG HISTORY: "Do you have any history of lung disease?"  (e.g., pulmonary embolus, asthma, emphysema)     no 9. PE RISK  FACTORS: "Do you have a history of blood clots?" (or: recent major surgery, recent prolonged travel, bedridden)     No 10. OTHER SYMPTOMS: "Do you have any other symptoms?" (e.g., runny nose, wheezing, chest pain)       Runny nose,tired 11. PREGNANCY: "Is there any chance you are pregnant?" "When was your last menstrual period?"       N/a 12. TRAVEL: "Have you traveled out of the country in the last month?" (e.g., travel history, exposures)       No  Protocols used: Cough - Acute Productive-A-AH

## 2022-01-16 ENCOUNTER — Ambulatory Visit: Payer: BC Managed Care – PPO | Admitting: Physician Assistant

## 2022-01-16 ENCOUNTER — Telehealth: Payer: Self-pay

## 2022-01-16 DIAGNOSIS — J302 Other seasonal allergic rhinitis: Secondary | ICD-10-CM | POA: Diagnosis not present

## 2022-01-16 MED ORDER — CETIRIZINE HCL 10 MG PO TABS: 10.0000 mg | ORAL_TABLET | Freq: Every day | ORAL | 11 refills | Status: DC

## 2022-01-16 MED ORDER — FLUTICASONE PROPIONATE 50 MCG/ACT NA SUSP: 2.0000 | Freq: Every day | NASAL | 6 refills | Status: DC

## 2022-01-16 NOTE — Telephone Encounter (Signed)
Mr. Franek returned call, and was scheduled for a Virtual visit today at 4:40 with [place], [place]

## 2022-01-16 NOTE — Telephone Encounter (Signed)
Pt called back this afternoon, but not sure how we can help him get an appt w/ Cari.Lonn [place] NT reached out to several people, w/ no response. Please call pt for an appt, thanks

## 2022-01-16 NOTE — Telephone Encounter (Signed)
Patient called with cold Like symptoms. Returned call to offer virtual appointment per [place], [place]. No answer, VM left with Mobile bus number, if interested.

## 2022-01-16 NOTE — Progress Notes (Unsigned)
   Established Patient Office Visit  Subjective   Patient ID: Justin Houston, male    DOB: 12-Jan-1966  Age: 56 y.o. MRN: [id number]  No chief complaint on file.   States that he has been having runny nose in the morning, stuffy nose, sneezing Denies cough, fever body aches chills No sick contacts       {History (Optional):23778}  ROS    Objective:     There were no vitals taken for this visit. {Vitals History (Optional):23777}  Physical Exam   No results found for any visits on 01/16/22.  {Labs (Optional):23779}  The 10-year ASCVD risk score (Arnett DK, et al., 2019) is: 32.5%    Assessment & Plan:   Problem List Items Addressed This Visit   None   No follow-ups on file.    Loraine Grip Mayers, PA-C

## 2022-01-17 ENCOUNTER — Encounter: Payer: Self-pay | Admitting: Physician Assistant

## 2022-01-28 ENCOUNTER — Ambulatory Visit: Payer: BC Managed Care – PPO | Admitting: Podiatry

## 2022-02-04 ENCOUNTER — Other Ambulatory Visit: Payer: Self-pay | Admitting: Critical Care Medicine

## 2022-02-05 IMAGING — CR DG ANKLE COMPLETE 3+V*L*
3 series · 3 of 3 positions shown · non-contrast
Comparison: 02/12/2007, 10/29/2017

CLINICAL DATA: Left ankle pain and edema

EXAM:
LEFT ANKLE COMPLETE - 3+ VIEW

[ankle ap]
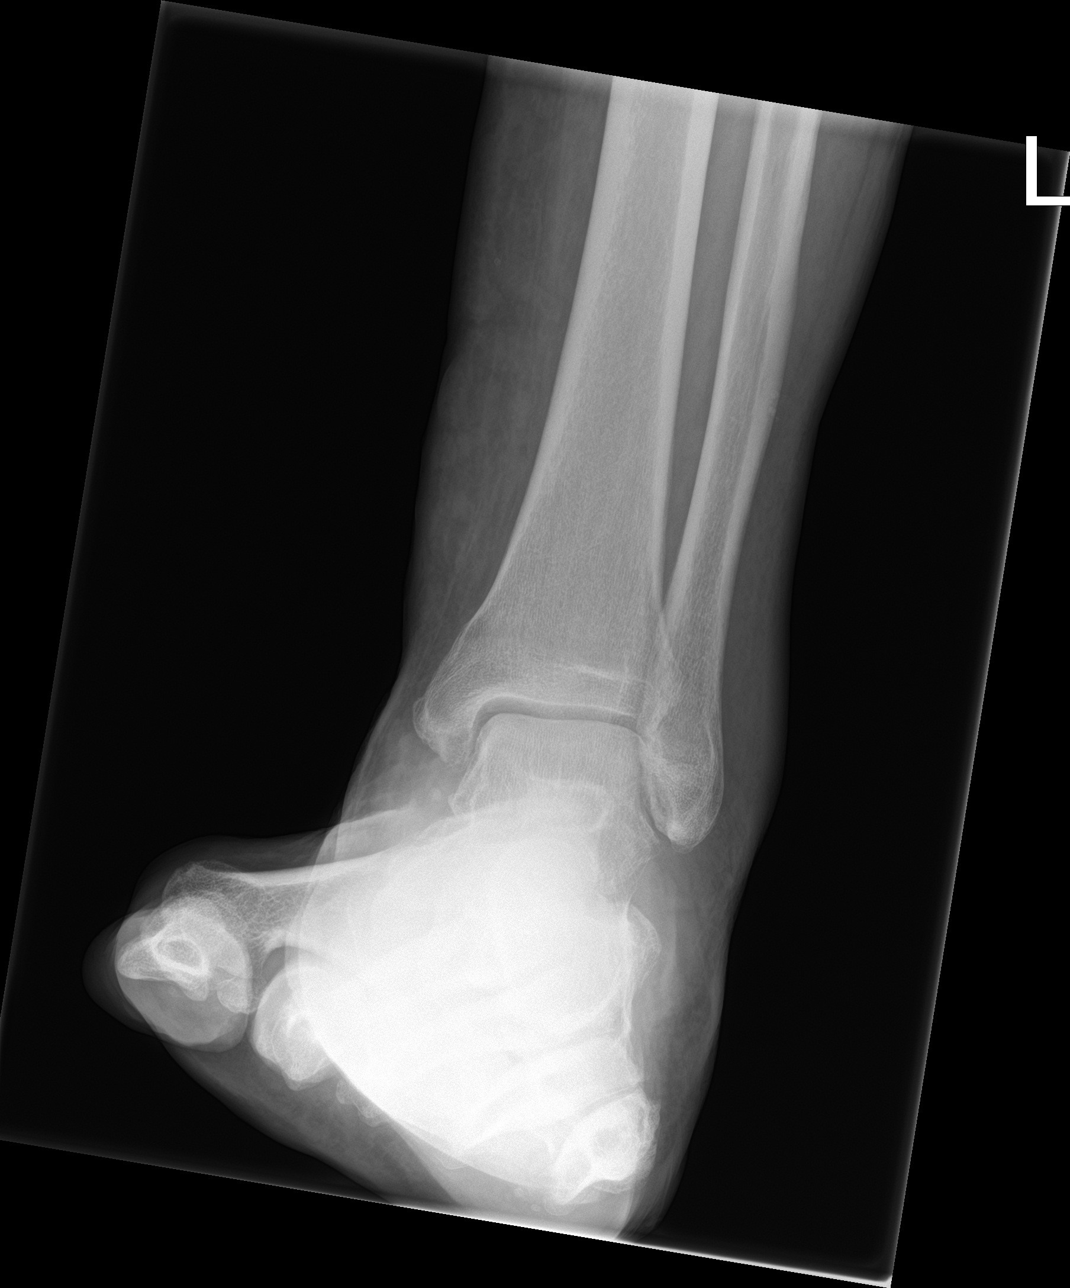

[ankle obl]
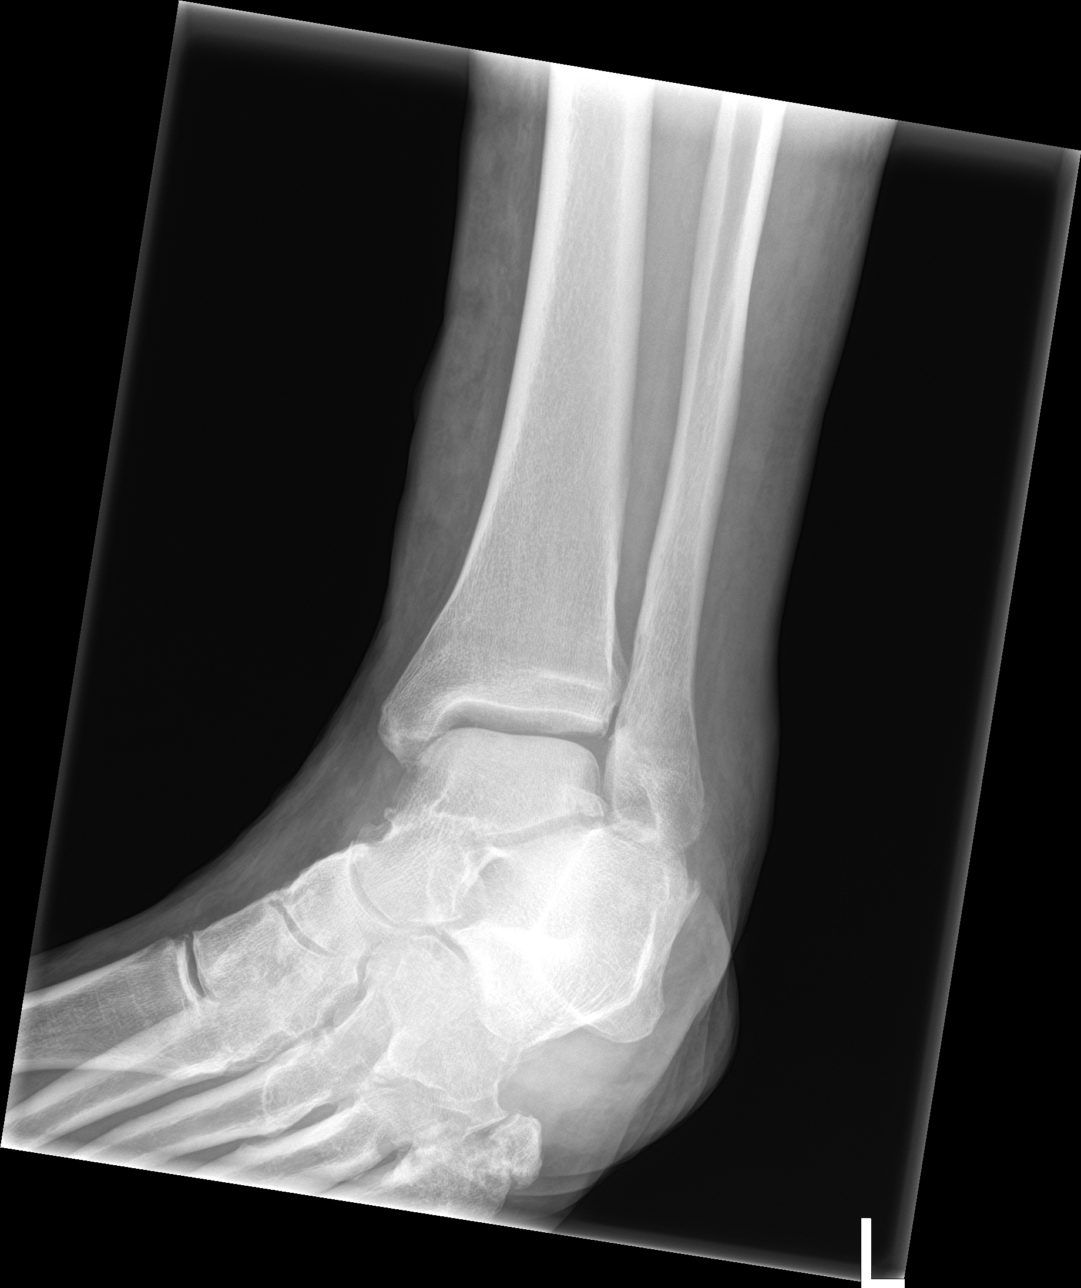

[ankle lat]
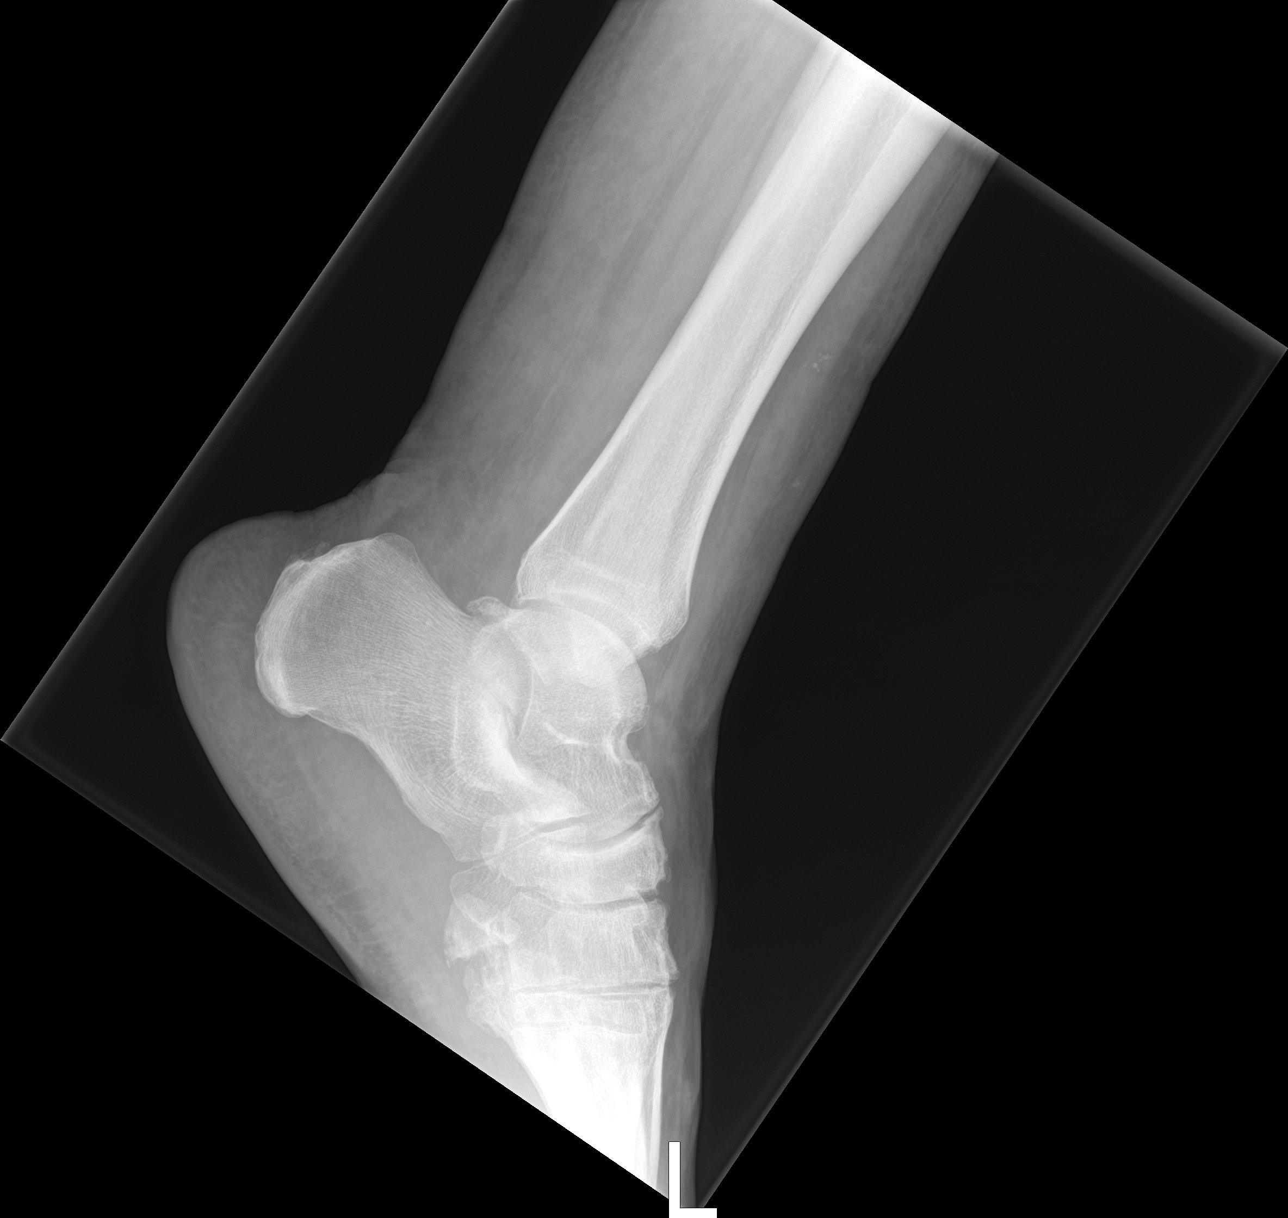

[3 of 3 positions shown; findings below may reference images not displayed]

FINDINGS: No evidence of acute fracture or dislocation. Chronic ununited
fracture at the fifth metatarsal base. Ankle mortise is congruent
with degenerative spurring within the medial and lateral gutters.
Mild arthropathy within the midfoot including the talonavicular
joint. Mild diffuse soft tissue swelling.
IMPRESSION: 1. Mild diffuse soft tissue swelling, without evidence of acute
fracture or dislocation.
2. Chronic ununited fracture at the fifth metatarsal base.

## 2022-02-12 ENCOUNTER — Other Ambulatory Visit: Payer: Self-pay | Admitting: Critical Care Medicine

## 2022-02-12 DIAGNOSIS — E1169 Type 2 diabetes mellitus with other specified complication: Secondary | ICD-10-CM

## 2022-02-12 NOTE — Telephone Encounter (Signed)
Requested Prescriptions  Pending Prescriptions Disp Refills  . FARXIGA 5 MG TABS tablet [Pharmacy Med Name: Wilder Glade 5 mg tablet] 60 tablet 1    Sig: TAKE 2 TABLETS BY MOUTH DAILY     Endocrinology:  Diabetes - SGLT2 Inhibitors Failed - 02/12/2022  1:12 PM      Failed - Cr in normal range and within 360 days    Creat  Date Value Ref Range Status  01/16/2016 0.95 0.70 - 1.33 mg/dL Final    Comment:      For patients > or = 57 years of age: The upper reference limit for Creatinine is approximately 13% higher for people identified as African-American.      Creatinine, Ser  Date Value Ref Range Status  11/19/2021 1.30 (H) 0.76 - 1.27 mg/dL Final   Creatinine, Urine  Date Value Ref Range Status  08/12/2010 201.3 mg/dL Final         Passed - HBA1C is between 0 and 7.9 and within 180 days    HbA1c, POC (controlled diabetic range)  Date Value Ref Range Status  11/19/2021 7.9 (A) 0.0 - 7.0 % Final         Passed - eGFR in normal range and within 360 days    GFR calc Af Amer  Date Value Ref Range Status  11/09/2019 75 >59 mL/min/1.73 Final    Comment:    **[facility] currently reports eGFR in compliance with the current**   recommendations of the [employer]. [facility] will   update reporting as new guidelines are published from the NKF-ASN   Task force.    GFR, Estimated  Date Value Ref Range Status  01/26/2020 >60 >60 mL/min Final   eGFR  Date Value Ref Range Status  11/19/2021 64 >59 mL/min/1.73 Final         Passed - Valid encounter within last 6 months    Recent Outpatient Visits          2 months ago Type 2 diabetes mellitus with retinopathy, with long-term current use of insulin, macular edema presence unspecified, unspecified laterality, unspecified retinopathy severity ([place] )   [place] [name], MD   5 months ago No-show for appointment   [place] [name], MD   1  year ago [place], [place] W, NP   1 year ago Type 2 diabetes mellitus with retinopathy, with long-term current use of insulin, macular edema presence unspecified, unspecified laterality, unspecified retinopathy severity [facility])   [place] [name], MD   1 year ago Left hip pain   [place], MD      Future Appointments            In 1 month Joya Gaskins Burnett Harry, MD [place]

## 2022-02-14 ENCOUNTER — Other Ambulatory Visit: Payer: Self-pay | Admitting: Critical Care Medicine

## 2022-02-18 ENCOUNTER — Other Ambulatory Visit: Payer: Self-pay | Admitting: Critical Care Medicine

## 2022-02-18 DIAGNOSIS — I10 Essential (primary) hypertension: Secondary | ICD-10-CM

## 2022-02-19 NOTE — Telephone Encounter (Signed)
Requested Prescriptions  Pending Prescriptions Disp Refills   meloxicam (MOBIC) 15 MG tablet [Pharmacy Med Name: meloxicam 15 mg tablet] 30 tablet 0    Sig: TAKE 1 TABLET BY MOUTH DAILY     Analgesics:  COX2 Inhibitors Failed - 02/18/2022 11:51 AM      Failed - Manual Review: Labs are only required if the patient has taken medication for more than 8 weeks.      Failed - Cr in normal range and within 360 days    Creat  Date Value Ref Range Status  01/16/2016 0.95 0.70 - 1.33 mg/dL Final    Comment:      For patients > or = 57 years of age: The upper reference limit for Creatinine is approximately 13% higher for people identified as African-American.      Creatinine, Ser  Date Value Ref Range Status  11/19/2021 1.30 (H) 0.76 - 1.27 mg/dL Final   Creatinine, Urine  Date Value Ref Range Status  08/12/2010 201.3 mg/dL Final         Passed - HGB in normal range and within 360 days    Hemoglobin  Date Value Ref Range Status  11/19/2021 16.0 13.0 - 17.7 g/dL Final         Passed - HCT in normal range and within 360 days    Hematocrit  Date Value Ref Range Status  11/19/2021 47.4 37.5 - 51.0 % Final         Passed - AST in normal range and within 360 days    AST  Date Value Ref Range Status  11/19/2021 20 0 - 40 IU/L Final         Passed - ALT in normal range and within 360 days    ALT  Date Value Ref Range Status  11/19/2021 16 0 - 44 IU/L Final         Passed - eGFR is 30 or above and within 360 days    GFR calc Af Amer  Date Value Ref Range Status  11/09/2019 75 >59 mL/min/1.73 Final    Comment:    **[facility] currently reports eGFR in compliance with the current**   recommendations of the [employer]. [facility] will   update reporting as new guidelines are published from the NKF-ASN   Task force.    GFR, Estimated  Date Value Ref Range Status  01/26/2020 >60 >60 mL/min Final   eGFR  Date Value Ref Range Status  11/19/2021 64 >59 mL/min/1.73  Final         Passed - Patient is not pregnant      Passed - Valid encounter within last 12 months    Recent Outpatient Visits           3 months ago Type 2 diabetes mellitus with retinopathy, with long-term current use of insulin, macular edema presence unspecified, unspecified laterality, unspecified retinopathy severity ([place])   [place] [name], MD   5 months ago No-show for appointment   [place] [name], MD   1 year ago COVID-59   [place], [place] W, NP   1 year ago Type 2 diabetes mellitus with retinopathy, with long-term current use of insulin, macular edema presence unspecified, unspecified laterality, unspecified retinopathy severity [facility])   [place], Patrick E, MD   1 year ago Left hip pain   [place], MD       Future Appointments             In 4 weeks [name], MD [place]

## 2022-03-05 ENCOUNTER — Ambulatory Visit: Payer: BC Managed Care – PPO | Admitting: Podiatry

## 2022-03-06 ENCOUNTER — Encounter: Payer: Self-pay | Admitting: Podiatry

## 2022-03-06 ENCOUNTER — Ambulatory Visit: Payer: BC Managed Care – PPO | Admitting: Podiatry

## 2022-03-06 DIAGNOSIS — E1151 Type 2 diabetes mellitus with diabetic peripheral angiopathy without gangrene: Secondary | ICD-10-CM | POA: Diagnosis not present

## 2022-03-06 DIAGNOSIS — E1169 Type 2 diabetes mellitus with other specified complication: Secondary | ICD-10-CM

## 2022-03-06 DIAGNOSIS — B351 Tinea unguium: Secondary | ICD-10-CM | POA: Diagnosis not present

## 2022-03-06 DIAGNOSIS — L84 Corns and callosities: Secondary | ICD-10-CM

## 2022-03-06 DIAGNOSIS — E11319 Type 2 diabetes mellitus with unspecified diabetic retinopathy without macular edema: Secondary | ICD-10-CM

## 2022-03-06 DIAGNOSIS — Z794 Long term (current) use of insulin: Secondary | ICD-10-CM

## 2022-03-06 NOTE — Progress Notes (Signed)
This patient returns to my office for at risk foot care.  This patient requires this care by a professional since this patient will be at risk due to having diabetes.  This patient is unable to cut nails himself since the patient cannot reach his nails.These nails are painful walking and wearing shoes.  He also has callus on the outside of his left foot.This patient presents for at risk foot care today.  General Appearance  Alert, conversant and in no acute stress.  Vascular  Dorsalis pedis and posterior tibial  pulses are palpable  bilaterally.  Capillary return is within normal limits  bilaterally. Temperature is within normal limits  bilaterally.  Neurologic  Senn-Weinstein monofilament wire test within normal limits  bilaterally. Muscle power within normal limits bilaterally.  Nails Thick disfigured discolored nails with subungual debris  from hallux to fifth toes bilaterally. No evidence of bacterial infection or drainage bilaterally.  Orthopedic  No limitations of motion  feet .  No crepitus or effusions noted.  No bony pathology or digital deformities noted.  Skin  normotropic skin with no porokeratosis noted bilaterally.  No signs of infections or ulcers noted.   Callus lateral aspect fifth metabase left foot.  Onychomycosis  Pain in right toes  Pain in left toes  Callus left foot.  Consent was obtained for treatment procedures.   Mechanical debridement of nails 1-5  bilaterally performed with a nail nipper.  Filed with dremel without incident. Debride callus with # 15 blade and dremel tool.   Return office visit     4 months                 Told patient to return for periodic foot care and evaluation due to potential at risk complications.   Gardiner Barefoot DPM

## 2022-03-14 ENCOUNTER — Other Ambulatory Visit: Payer: Self-pay | Admitting: Critical Care Medicine

## 2022-03-14 DIAGNOSIS — I10 Essential (primary) hypertension: Secondary | ICD-10-CM

## 2022-03-18 NOTE — Progress Notes (Unsigned)
Established Patient Office Visit  Subjective   Patient ID: Justin Houston, male    DOB: 10-01-1965  Age: 56 y.o. MRN: [id number]  No chief complaint on file.   11/2021 [name] is 56 year old male presenting today for follow up. He has history of type 2 diabetes, diabetic retinopathy and hypertension.   Diabetes medications include: farxiga 5 mg daily, trulicity 0.92 weekly, metformin 1000 mg BID, levemir 40 units BID Today's Hemoglobin A1C= 7.9%, previously 6.7% (09/2020). He does take his blood sugars at home, but needs a new battery for the machine.   He has an upcoming podiatry appointment 01/28/2022 and dentistry appointment 08/25. He recently saw opthalmology in June and reports he is obtaining new glasses soon. He also has a colonoscopy scheduled with Dr Ardis Hughs on 12/27/2021.   Hypertension medications include: amlodipine 10 mg daily, coreg 25 mg BID, chlorthalidone 25 mg daily, hydralazine 100 TID, and atorvastatin 40 mg daily.  Todays blood pressure= 149/80. He does not have a blood pressure machine at home.   Per patient, he has had a hard time recently controlling his diet. He also does not exercise much, aside from walking at his job.   Due for screening labs, hemoglobin A1C, and microalbumin today  12/5  Hypertension   Today's blood pressure = 149/80 Will add aldactone 25 mg daily to current regimen, refills for all sent today  Lifestyle and diet handout reviewed and emphasized today  Home blood pressure machine ordered to pharmacy, recommend patient start taking daily measurements  The following Lifestyle Medicine recommendations according to [place] of Lifestyle Medicine [facility] discussed and offered to patient who agrees to start the journey:  A. Whole Foods, Plant-based plate comprising of fruits and vegetables, plant-based proteins, whole-grain carbohydrates was discussed in detail with the patient. A list for source of those nutrients were also  provided to the patient. Patient will use only water or unsweetened tea for hydration. B. The need to stay away from risky substances including alcohol, smoking; obtaining 7 to 9 hours of restorative sleep, at least 150 minutes of moderate intensity exercise weekly, the importance of healthy social connections, and stress reduction techniques were discussed. C. A full color page of Calorie density of various food groups per pound showing examples of each food groups was provided to the patient.       Patient Active Problem List   Diagnosis Date Noted  . History of colonic polyps   . Benign neoplasm of transverse colon   . Benign neoplasm of rectum   . Left hip pain 10/03/2020  . Varicose veins of both legs with edema 08/31/2018  . GERD (gastroesophageal reflux disease) 08/16/2016  . Diabetic retinopathy ([place]) 12/08/2014  . Umbilical hernia without obstruction and without gangrene 09/29/2013  . Glaucoma 05/26/2013  . Gout 08/01/2012  . BMI 45.0-49.9, adult ([place]) 08/01/2012  . Hypertension 07/20/2011  . Diabetes mellitus type 2 with retinopathy ([place]) 07/20/2011  . Hyperlipidemia associated with type 2 diabetes mellitus ([place]) 07/20/2011   Past Medical History:  Diagnosis Date  . Dermatitis   . Diabetes mellitus   . GERD (gastroesophageal reflux disease)   . Gout   . Hyperlipidemia   . Hypertension   . Lipoma of scalp   . Mass of head, right postauricular SQ 7x4cm 09/29/2013  . Obesity   . Toenail fungus 08/31/2018   Past Surgical History:  Procedure Laterality Date  . COLONOSCOPY WITH PROPOFOL N/A 12/01/2014   Procedure: COLONOSCOPY WITH PROPOFOL;  Surgeon: [name], MD;  Location: [facility];  Service: Endoscopy;  Laterality: N/A;  . COLONOSCOPY WITH PROPOFOL N/A 12/20/2021   Procedure: COLONOSCOPY WITH PROPOFOL;  Surgeon: Ladene Artist, MD;  Location: [facility];  Service: Gastroenterology;  Laterality: N/A;  . LIPOMA EXCISION N/A 01/28/2020   Procedure:  EXCISION RIGHT SCALP LIPOMA;  Surgeon: Cindra Presume, MD;  Location: [facility];  Service: Plastics;  Laterality: N/A;  . POLYPECTOMY  12/20/2021   Procedure: POLYPECTOMY;  Surgeon: Ladene Artist, MD;  Location: [facility];  Service: Gastroenterology;;  . right arm ORIF     Social History   Tobacco Use  . Smoking status: Former    Types: Cigarettes    Quit date: 09/05/1994    Years since quitting: 27.5  . Smokeless tobacco: Never  Vaping Use  . Vaping Use: Never used  Substance Use Topics  . Alcohol use: No    Alcohol/week: 0.0 standard drinks of alcohol  . Drug use: No   Family History  Problem Relation Age of Onset  . Stroke Mother   . Hypertension Mother   . Diabetes Maternal Grandmother   . Prostate cancer Maternal Grandfather   . Diabetes Other    Allergies  Allergen Reactions  . Losartan Potassium-Hctz Hives  . Cozaar [Losartan Potassium] Hives    Hives       Review of Systems  Constitutional: Negative.   HENT: Negative.    Eyes: Negative.   Respiratory: Negative.    Cardiovascular: Negative.   Gastrointestinal: Negative.   Genitourinary: Negative.   Musculoskeletal: Negative.   Skin: Negative.   Neurological: Negative.   Endo/Heme/Allergies: Negative.   Psychiatric/Behavioral: Negative.        Objective:     There were no vitals taken for this visit.    Physical Exam Constitutional:      Appearance: He is obese.  HENT:     Right Ear: Tympanic membrane, ear canal and external ear normal.     Left Ear: Tympanic membrane, ear canal and external ear normal.     Mouth/Throat:     Mouth: Mucous membranes are moist.     Pharynx: Oropharynx is clear.  Eyes:     Conjunctiva/sclera: Conjunctivae normal.  Cardiovascular:     Rate and Rhythm: Normal rate and regular rhythm.  Pulmonary:     Effort: Pulmonary effort is normal.     Breath sounds: Normal breath sounds.  Abdominal:     General: Bowel sounds are normal.      Palpations: Abdomen is soft.  Skin:    General: Skin is warm.  Neurological:     Mental Status: He is alert and oriented to person, place, and time.  Psychiatric:        Mood and Affect: Mood normal.        Behavior: Behavior normal.     No results found for any visits on 03/19/22.      The 10-year ASCVD risk score (Arnett DK, et al., 2019) is: 32.5%    Assessment & Plan:   Problem List Items Addressed This Visit   None 38 minutes spent extra time needed for patient education multisystems assessed  No follow-ups on file.    Asencion Noble, MD

## 2022-03-19 ENCOUNTER — Encounter: Payer: Self-pay | Admitting: Critical Care Medicine

## 2022-03-19 ENCOUNTER — Other Ambulatory Visit: Payer: Self-pay | Admitting: Critical Care Medicine

## 2022-03-19 ENCOUNTER — Ambulatory Visit: Payer: BC Managed Care – PPO | Attending: Critical Care Medicine | Admitting: Critical Care Medicine

## 2022-03-19 VITALS — BP 158/78 | HR 76 | Wt 328.2 lb

## 2022-03-19 DIAGNOSIS — I10 Essential (primary) hypertension: Secondary | ICD-10-CM | POA: Diagnosis not present

## 2022-03-19 DIAGNOSIS — Z23 Encounter for immunization: Secondary | ICD-10-CM | POA: Diagnosis not present

## 2022-03-19 DIAGNOSIS — E113399 Type 2 diabetes mellitus with moderate nonproliferative diabetic retinopathy without macular edema, unspecified eye: Secondary | ICD-10-CM | POA: Diagnosis not present

## 2022-03-19 DIAGNOSIS — E669 Obesity, unspecified: Secondary | ICD-10-CM

## 2022-03-19 DIAGNOSIS — E11319 Type 2 diabetes mellitus with unspecified diabetic retinopathy without macular edema: Secondary | ICD-10-CM | POA: Diagnosis not present

## 2022-03-19 DIAGNOSIS — E785 Hyperlipidemia, unspecified: Secondary | ICD-10-CM

## 2022-03-19 DIAGNOSIS — Z794 Long term (current) use of insulin: Secondary | ICD-10-CM

## 2022-03-19 DIAGNOSIS — E1169 Type 2 diabetes mellitus with other specified complication: Secondary | ICD-10-CM | POA: Diagnosis not present

## 2022-03-19 LAB — GLUCOSE, POCT (MANUAL RESULT ENTRY): POC Glucose: 106 mg/dl — AB (ref 70–99)

## 2022-03-19 LAB — POCT GLYCOSYLATED HEMOGLOBIN (HGB A1C): HbA1c, POC (controlled diabetic range): 6.5 % (ref 0.0–7.0)

## 2022-03-19 MED ORDER — TRULICITY 1.5 MG/0.5ML ~~LOC~~ SOAJ: 1.5000 mg | SUBCUTANEOUS | 3 refills | Status: DC

## 2022-03-19 MED ORDER — CARVEDILOL 25 MG PO TABS: ORAL_TABLET | ORAL | 2 refills | Status: DC

## 2022-03-19 MED ORDER — INSULIN DETEMIR 100 UNIT/ML ~~LOC~~ SOLN
SUBCUTANEOUS | 2 refills | Status: DC
Start: 2022-03-19 — End: 2022-04-18

## 2022-03-19 MED ORDER — INSULIN SYRINGE-NEEDLE U-100 31G X 5/16" 1 ML MISC
1 refills | Status: DC
Start: 2022-03-19 — End: 2022-04-16

## 2022-03-19 MED ORDER — OMEPRAZOLE 20 MG PO CPDR: 20.0000 mg | DELAYED_RELEASE_CAPSULE | Freq: Every day | ORAL | 3 refills | Status: DC

## 2022-03-19 MED ORDER — DAPAGLIFLOZIN PROPANEDIOL 5 MG PO TABS: 10.0000 mg | ORAL_TABLET | Freq: Every day | ORAL | 0 refills | Status: DC

## 2022-03-19 MED ORDER — METFORMIN HCL 1000 MG PO TABS: 1000.0000 mg | ORAL_TABLET | Freq: Two times a day (BID) | ORAL | 1 refills | Status: DC

## 2022-03-19 MED ORDER — ATORVASTATIN CALCIUM 40 MG PO TABS: 40.0000 mg | ORAL_TABLET | Freq: Every day | ORAL | 3 refills | Status: DC

## 2022-03-19 MED ORDER — SPIRONOLACTONE 50 MG PO TABS: 50.0000 mg | ORAL_TABLET | Freq: Every day | ORAL | 2 refills | Status: DC

## 2022-03-19 MED ORDER — CHLORTHALIDONE 25 MG PO TABS: ORAL_TABLET | ORAL | 2 refills | Status: DC

## 2022-03-19 MED ORDER — HYDRALAZINE HCL 100 MG PO TABS: 100.0000 mg | ORAL_TABLET | Freq: Three times a day (TID) | ORAL | 0 refills | Status: DC

## 2022-03-19 MED ORDER — AMLODIPINE BESYLATE 10 MG PO TABS: 10.0000 mg | ORAL_TABLET | Freq: Every day | ORAL | 2 refills | Status: DC

## 2022-03-19 MED ORDER — MELOXICAM 15 MG PO TABS: 15.0000 mg | ORAL_TABLET | Freq: Every day | ORAL | 1 refills | Status: DC

## 2022-03-19 NOTE — Patient Instructions (Addendum)
Flu vaccine / second shingles vaccine given today  Labs today for health screenings  All medications refilled  Increase spironolactone to one '50mg'$  daily, can take '25mg'$  tabs up at two daily  Return Dr Joya Gaskins 2 months for blood pressure recheck

## 2022-03-20 ENCOUNTER — Encounter: Payer: Self-pay | Admitting: Critical Care Medicine

## 2022-03-20 LAB — BMP8+EGFR
BUN/Creatinine Ratio: 20 (ref 9–20)
BUN: 24 mg/dL (ref 6–24)
CO2: 24 mmol/L (ref 20–29)
Calcium: 9.9 mg/dL (ref 8.7–10.2)
Chloride: 100 mmol/L (ref 96–106)
Creatinine, Ser: 1.21 mg/dL (ref 0.76–1.27)
Glucose: 86 mg/dL (ref 70–99)
Potassium: 4.2 mmol/L (ref 3.5–5.2)
Sodium: 141 mmol/L (ref 134–144)
eGFR: 70 mL/min/{1.73_m2} (ref >59)

## 2022-03-20 LAB — LIPID PANEL
Chol/HDL Ratio: 4.1 ratio (ref 0.0–5.0)
Cholesterol, Total: 169 mg/dL (ref 100–199)
HDL: 41 mg/dL (ref >39)
LDL Chol Calc (NIH): 111 mg/dL — ABNORMAL HIGH (ref 0–99)
Triglycerides: 89 mg/dL (ref 0–149)
VLDL Cholesterol Cal: 17 mg/dL (ref 5–40)

## 2022-03-20 NOTE — Assessment & Plan Note (Signed)
Patient encouraged to get an eye exam as he has had retinopathy in the past

## 2022-03-20 NOTE — Assessment & Plan Note (Signed)
Patient is made great progress on his A1c and diabetes care he will continue the insulin program along with [place] and Trulicity and metformin and maintain current diet and return in follow-up

## 2022-03-20 NOTE — Assessment & Plan Note (Signed)
Not well-controlled at this time we will increase Aldactone to 50 mg daily maintain other medications as prescribed have the patient come back short-term follow-up for hypertension evaluations and treatments and obtain metabolic panel

## 2022-03-20 NOTE — Progress Notes (Signed)
Let pt know cholesterol still too high adding zetia to take daily and stay on atorvastatin,  kidney normal

## 2022-03-20 NOTE — Assessment & Plan Note (Signed)
Continue current program 

## 2022-03-21 ENCOUNTER — Telehealth: Payer: Self-pay

## 2022-03-21 NOTE — Telephone Encounter (Signed)
-----   Message from [name], MD sent at 03/20/2022  8:15 AM EST ----- Let pt know cholesterol still too high adding zetia to take daily and stay on atorvastatin,  kidney normal

## 2022-03-21 NOTE — Telephone Encounter (Signed)
Pt was called and vm was left, Information has been sent to nurse pool.   

## 2022-04-12 ENCOUNTER — Other Ambulatory Visit: Payer: Self-pay | Admitting: Critical Care Medicine

## 2022-04-12 NOTE — Telephone Encounter (Signed)
Unable to refill per protocol, Rx request is too soon. Last refill 03/19/22 90 and 2 refills.  Requested Prescriptions  Pending Prescriptions Disp Refills   spironolactone (ALDACTONE) 25 MG tablet [Pharmacy Med Name: spironolactone 25 mg tablet] 60 tablet 2    Sig: TAKE 1 TABLET BY MOUTH EVERY DAY     Cardiovascular: Diuretics - Aldosterone Antagonist Failed - 04/12/2022  3:16 PM      Failed - Last BP in normal range    BP Readings from Last 1 Encounters:  03/19/22 (!) 158/78         Passed - Cr in normal range and within 180 days    Creat  Date Value Ref Range Status  01/16/2016 0.95 0.70 - 1.33 mg/dL Final    Comment:      For patients > or = 57 years of age: The upper reference limit for Creatinine is approximately 13% higher for people identified as African-American.      Creatinine, Ser  Date Value Ref Range Status  03/19/2022 1.21 0.76 - 1.27 mg/dL Final   Creatinine, Urine  Date Value Ref Range Status  08/12/2010 201.3 mg/dL Final         Passed - K in normal range and within 180 days    Potassium  Date Value Ref Range Status  03/19/2022 4.2 3.5 - 5.2 mmol/L Final         Passed - Na in normal range and within 180 days    Sodium  Date Value Ref Range Status  03/19/2022 141 134 - 144 mmol/L Final         Passed - eGFR is 30 or above and within 180 days    GFR calc Af Amer  Date Value Ref Range Status  11/09/2019 75 >59 mL/min/1.73 Final    Comment:    **[facility] currently reports eGFR in compliance with the current**   recommendations of the [employer]. [facility] will   update reporting as new guidelines are published from the NKF-ASN   Task force.    GFR, Estimated  Date Value Ref Range Status  01/26/2020 >60 >60 mL/min Final   eGFR  Date Value Ref Range Status  03/19/2022 70 >59 mL/min/1.73 Final         Passed - Valid encounter within last 6 months    Recent Outpatient Visits           3 weeks ago Essential hypertension    [place] [name], MD   4 months ago Type 2 diabetes mellitus with retinopathy, with long-term current use of insulin, macular edema presence unspecified, unspecified laterality, unspecified retinopathy severity ([place])   [place] [name], MD   7 months ago No-show for appointment   [place] [name], MD   1 year ago [place], Vernia Buff, NP   1 year ago Type 2 diabetes mellitus with retinopathy, with long-term current use of insulin, macular edema presence unspecified, unspecified laterality, unspecified retinopathy severity [facility])   [place], MD       Future Appointments             In 1 month Joya Gaskins Burnett Harry, MD [place]

## 2022-04-16 ENCOUNTER — Other Ambulatory Visit: Payer: Self-pay | Admitting: Critical Care Medicine

## 2022-04-17 ENCOUNTER — Other Ambulatory Visit: Payer: Self-pay | Admitting: Critical Care Medicine

## 2022-05-01 ENCOUNTER — Other Ambulatory Visit: Payer: Self-pay | Admitting: Critical Care Medicine

## 2022-05-01 DIAGNOSIS — I10 Essential (primary) hypertension: Secondary | ICD-10-CM

## 2022-05-06 ENCOUNTER — Other Ambulatory Visit: Payer: Self-pay | Admitting: Critical Care Medicine

## 2022-05-07 NOTE — Telephone Encounter (Signed)
Refilled 03/19/2022 #90 2 rf Requested Prescriptions  Pending Prescriptions Disp Refills   spironolactone (ALDACTONE) 25 MG tablet [Pharmacy Med Name: spironolactone 25 mg tablet] 60 tablet 2    Sig: TAKE 1 TABLET BY MOUTH EVERY DAY     Cardiovascular: Diuretics - Aldosterone Antagonist Failed - 05/06/2022  5:03 PM      Failed - Last BP in normal range    BP Readings from Last 1 Encounters:  03/19/22 (!) 158/78         Passed - Cr in normal range and within 180 days    Creat  Date Value Ref Range Status  01/16/2016 0.95 0.70 - 1.33 mg/dL Final    Comment:      For patients > or = 58 years of age: The upper reference limit for Creatinine is approximately 13% higher for people identified as African-American.      Creatinine, Ser  Date Value Ref Range Status  03/19/2022 1.21 0.76 - 1.27 mg/dL Final   Creatinine, Urine  Date Value Ref Range Status  08/12/2010 201.3 mg/dL Final         Passed - K in normal range and within 180 days    Potassium  Date Value Ref Range Status  03/19/2022 4.2 3.5 - 5.2 mmol/L Final         Passed - Na in normal range and within 180 days    Sodium  Date Value Ref Range Status  03/19/2022 141 134 - 144 mmol/L Final         Passed - eGFR is 30 or above and within 180 days    GFR calc Af Amer  Date Value Ref Range Status  11/09/2019 75 >59 mL/min/1.73 Final    Comment:    **[facility] currently reports eGFR in compliance with the current**   recommendations of the [employer]. [facility] will   update reporting as new guidelines are published from the NKF-ASN   Task force.    GFR, Estimated  Date Value Ref Range Status  01/26/2020 >60 >60 mL/min Final   eGFR  Date Value Ref Range Status  03/19/2022 70 >59 mL/min/1.73 Final         Passed - Valid encounter within last 6 months    Recent Outpatient Visits           1 month ago Essential hypertension   [place], MD    5 months ago Type 2 diabetes mellitus with retinopathy, with long-term current use of insulin, macular edema presence unspecified, unspecified laterality, unspecified retinopathy severity [facility])   [place] [name], MD   8 months ago No-show for appointment   [facility] [name], MD   1 year ago [place], Vernia Buff, NP   1 year ago Type 2 diabetes mellitus with retinopathy, with long-term current use of insulin, macular edema presence unspecified, unspecified laterality, unspecified retinopathy severity [facility])   [place] [name], MD       Future Appointments             In 3 weeks [name], MD [place]

## 2022-05-26 NOTE — Progress Notes (Unsigned)
Established Patient Office Visit  Subjective   Patient ID: Justin Houston, male    DOB: 1965/12/23  Age: 57 y.o. MRN: [id number]  No chief complaint on file.   11/2021 Justin Houston is 57 year old male presenting today for follow up. He has history of type 2 diabetes, diabetic retinopathy and hypertension.   Diabetes medications include: farxiga 5 mg daily, trulicity [id number] weekly, metformin 1000 mg BID, levemir 40 units BID Today's Hemoglobin A1C= 7.9%, previously 6.7% (09/2020). He does take his blood sugars at home, but needs a new battery for the machine.   He has an upcoming podiatry appointment 01/28/2022 and dentistry appointment 08/25. He recently saw opthalmology in June and reports he is obtaining new glasses soon. He also has a colonoscopy scheduled with Dr Ardis Hughs on 12/27/2021.   Hypertension medications include: amlodipine 10 mg daily, coreg 25 mg BID, chlorthalidone 25 mg daily, hydralazine 100 TID, and atorvastatin 40 mg daily.  Todays blood pressure= 149/80. He does not have a blood pressure machine at home.   Per patient, he has had a hard time recently controlling his diet. He also does not exercise much, aside from walking at his job.   Due for screening labs, hemoglobin A1C, and microalbumin today  12/5 patient seen in return follow-up for hypertension diabetes on arrival blood pressure remains elevated 158/78.  He has been compliant with his medications but sometimes forgets to take the afternoon dose of his carvedilol and hydralazine.  He does need a follow-up shingles vaccination and flu shot he agrees to receive both at this visit.  Blood sugar on arrival is 106 and hemoglobin A1c is in excellent control at 6.5  The patient was started on Aldactone 25 mg daily at the last visit this dose will need to be increased  05/28/22     Patient Active Problem List   Diagnosis Date Noted   History of colonic polyps    Benign neoplasm of transverse colon    Benign  neoplasm of rectum    Left hip pain 10/03/2020   Varicose veins of both legs with edema 08/31/2018   GERD (gastroesophageal reflux disease) 08/16/2016   Diabetic retinopathy ([place]) [id number]   Umbilical hernia without obstruction and without gangrene 09/29/2013   Glaucoma 05/26/2013   Gout 08/01/2012   BMI 45.0-49.9, adult ([place]) 08/01/2012   Hypertension 07/20/2011   Diabetes mellitus type 2 with retinopathy ([place]) 07/20/2011   Hyperlipidemia associated with type 2 diabetes mellitus ([place]) 07/20/2011   Past Medical History:  Diagnosis Date   Dermatitis    Diabetes mellitus    GERD (gastroesophageal reflux disease)    Gout    Hyperlipidemia    Hypertension    Lipoma of scalp    Mass of head, right postauricular SQ 7x4cm 09/29/2013   Obesity    Toenail fungus 08/31/2018   Past Surgical History:  Procedure Laterality Date   COLONOSCOPY WITH PROPOFOL N/A 12/01/2014   Procedure: COLONOSCOPY WITH PROPOFOL;  Surgeon: [name], MD;  Location: [facility];  Service: Endoscopy;  Laterality: N/A;   COLONOSCOPY WITH PROPOFOL N/A 12/20/2021   Procedure: COLONOSCOPY WITH PROPOFOL;  Surgeon: Ladene Artist, MD;  Location: [facility];  Service: Gastroenterology;  Laterality: N/A;   LIPOMA EXCISION N/A 01/28/2020   Procedure: EXCISION RIGHT SCALP LIPOMA;  Surgeon: Cindra Presume, MD;  Location: [facility];  Service: Plastics;  Laterality: N/A;   POLYPECTOMY  12/20/2021   Procedure: POLYPECTOMY;  Surgeon: Ladene Artist,  MD;  Location: [facility];  Service: Gastroenterology;;   right arm ORIF     Social History   Tobacco Use   Smoking status: Former    Types: Cigarettes    Quit date: 09/05/1994    Years since quitting: 27.7   Smokeless tobacco: Never  Vaping Use   Vaping Use: Never used  Substance Use Topics   Alcohol use: No    Alcohol/week: 0.0 standard drinks of alcohol   Drug use: No   Family History  Problem Relation Age of Onset   Stroke Mother     Hypertension Mother    Diabetes Maternal Grandmother    Prostate cancer Maternal Grandfather    Diabetes Other    Allergies  Allergen Reactions   Losartan Potassium-Hctz Hives   Cozaar [Losartan Potassium] Hives    Hives       Review of Systems  Constitutional: Negative.  Negative for chills, diaphoresis, fever, malaise/fatigue and weight loss.  HENT: Negative.  Negative for congestion, hearing loss, nosebleeds, sore throat and tinnitus.   Eyes: Negative.  Negative for blurred vision, photophobia and redness.  Respiratory: Negative.  Negative for cough, hemoptysis, sputum production, shortness of breath, wheezing and stridor.   Cardiovascular: Negative.  Negative for chest pain, palpitations, orthopnea, claudication, leg swelling and PND.  Gastrointestinal: Negative.  Negative for abdominal pain, blood in stool, constipation, diarrhea, heartburn, nausea and vomiting.  Genitourinary: Negative.  Negative for dysuria, flank pain, frequency, hematuria and urgency.  Musculoskeletal: Negative.  Negative for back pain, falls, joint pain, myalgias and neck pain.  Skin: Negative.  Negative for itching and rash.  Neurological: Negative.  Negative for dizziness, tingling, tremors, sensory change, speech change, focal weakness, seizures, loss of consciousness, weakness and headaches.  Endo/Heme/Allergies: Negative.  Negative for environmental allergies and polydipsia. Does not bruise/bleed easily.  Psychiatric/Behavioral: Negative.  Negative for depression, memory loss, substance abuse and suicidal ideas. The patient is not nervous/anxious and does not have insomnia.       Objective:     There were no vitals taken for this visit.    Physical Exam Vitals reviewed.  Constitutional:      Appearance: Normal appearance. He is well-developed. He is obese. He is not diaphoretic.  HENT:     Head: Normocephalic and atraumatic.     Right Ear: Tympanic membrane, ear canal and external ear normal.      Left Ear: Tympanic membrane, ear canal and external ear normal.     Nose: No nasal deformity, septal deviation, mucosal edema or rhinorrhea.     Right Sinus: No maxillary sinus tenderness or frontal sinus tenderness.     Left Sinus: No maxillary sinus tenderness or frontal sinus tenderness.     Mouth/Throat:     Mouth: Mucous membranes are moist.     Pharynx: Oropharynx is clear. No oropharyngeal exudate.  Eyes:     General: No scleral icterus.    Conjunctiva/sclera: Conjunctivae normal.     Pupils: Pupils are equal, round, and reactive to light.  Neck:     Thyroid: No thyromegaly.     Vascular: No carotid bruit or JVD.     Trachea: Trachea normal. No tracheal tenderness or tracheal deviation.  Cardiovascular:     Rate and Rhythm: Normal rate and regular rhythm.     Chest Wall: PMI is not displaced.     Pulses: Normal pulses. No decreased pulses.     Heart sounds: Normal heart sounds, S1 normal and S2 normal. Heart sounds  not distant. No murmur heard.    No systolic murmur is present.     No diastolic murmur is present.     No friction rub. No gallop. No S3 or S4 sounds.  Pulmonary:     Effort: Pulmonary effort is normal. No tachypnea, accessory muscle usage or respiratory distress.     Breath sounds: Normal breath sounds. No stridor. No decreased breath sounds, wheezing, rhonchi or rales.  Chest:     Chest wall: No tenderness.  Abdominal:     General: Bowel sounds are normal. There is no distension.     Palpations: Abdomen is soft. Abdomen is not rigid.     Tenderness: There is no abdominal tenderness. There is no guarding or rebound.  Musculoskeletal:        General: Normal range of motion.     Cervical back: Normal range of motion and neck supple. No edema, erythema or rigidity. No muscular tenderness. Normal range of motion.  Lymphadenopathy:     Head:     Right side of head: No submental or submandibular adenopathy.     Left side of head: No submental or submandibular  adenopathy.     Cervical: No cervical adenopathy.  Skin:    General: Skin is warm and dry.     Coloration: Skin is not pale.     Findings: No rash.     Nails: There is no clubbing.  Neurological:     Mental Status: He is alert and oriented to person, place, and time.     Sensory: No sensory deficit.  Psychiatric:        Mood and Affect: Mood normal.        Speech: Speech normal.        Behavior: Behavior normal.      No results found for any visits on 05/28/22.       The 10-year ASCVD risk score (Arnett DK, et al., 2019) is: 30.6%    Assessment & Plan:   Problem List Items Addressed This Visit   None No follow-ups on file.   30 minutes spent extra time needing assessing multiple problems and patient education Asencion Noble, MD

## 2022-05-28 ENCOUNTER — Other Ambulatory Visit: Payer: Self-pay

## 2022-05-28 ENCOUNTER — Ambulatory Visit: Payer: BC Managed Care – PPO | Attending: Critical Care Medicine | Admitting: Critical Care Medicine

## 2022-05-28 ENCOUNTER — Encounter: Payer: Self-pay | Admitting: Critical Care Medicine

## 2022-05-28 VITALS — BP 135/78 | HR 67 | Temp 97.9°F | Resp 20 | Wt 326.0 lb

## 2022-05-28 DIAGNOSIS — E1169 Type 2 diabetes mellitus with other specified complication: Secondary | ICD-10-CM

## 2022-05-28 DIAGNOSIS — E785 Hyperlipidemia, unspecified: Secondary | ICD-10-CM

## 2022-05-28 DIAGNOSIS — L309 Dermatitis, unspecified: Secondary | ICD-10-CM | POA: Diagnosis not present

## 2022-05-28 DIAGNOSIS — I10 Essential (primary) hypertension: Secondary | ICD-10-CM | POA: Diagnosis not present

## 2022-05-28 DIAGNOSIS — E11319 Type 2 diabetes mellitus with unspecified diabetic retinopathy without macular edema: Secondary | ICD-10-CM

## 2022-05-28 DIAGNOSIS — Z794 Long term (current) use of insulin: Secondary | ICD-10-CM

## 2022-05-28 DIAGNOSIS — E669 Obesity, unspecified: Secondary | ICD-10-CM

## 2022-05-28 DIAGNOSIS — E113399 Type 2 diabetes mellitus with moderate nonproliferative diabetic retinopathy without macular edema, unspecified eye: Secondary | ICD-10-CM

## 2022-05-28 MED ORDER — METFORMIN HCL 1000 MG PO TABS: 1000.0000 mg | ORAL_TABLET | Freq: Two times a day (BID) | ORAL | 1 refills | Status: DC

## 2022-05-28 MED ORDER — TRULICITY 1.5 MG/0.5ML ~~LOC~~ SOAJ
1.5000 mg | SUBCUTANEOUS | 3 refills | Status: DC
  Filled 2022-05-28: qty 2, 28d supply, fill #0

## 2022-05-28 MED ORDER — CARVEDILOL 25 MG PO TABS: 25.0000 mg | ORAL_TABLET | Freq: Two times a day (BID) | ORAL | 2 refills | Status: DC

## 2022-05-28 MED ORDER — OMEPRAZOLE 20 MG PO CPDR: 20.0000 mg | DELAYED_RELEASE_CAPSULE | Freq: Every day | ORAL | 3 refills | Status: DC

## 2022-05-28 MED ORDER — CLOBETASOL PROPIONATE 0.05 % EX OINT: 1.0000 | TOPICAL_OINTMENT | Freq: Three times a day (TID) | CUTANEOUS | 1 refills | Status: AC | PRN

## 2022-05-28 MED ORDER — ATORVASTATIN CALCIUM 40 MG PO TABS: 40.0000 mg | ORAL_TABLET | Freq: Every day | ORAL | 3 refills | Status: DC

## 2022-05-28 MED ORDER — CHLORTHALIDONE 25 MG PO TABS: ORAL_TABLET | ORAL | 2 refills | Status: DC

## 2022-05-28 MED ORDER — INSULIN GLARGINE 100 UNIT/ML ~~LOC~~ SOLN: 40.0000 [IU] | Freq: Two times a day (BID) | SUBCUTANEOUS | 1 refills | Status: DC

## 2022-05-28 MED ORDER — DAPAGLIFLOZIN PROPANEDIOL 10 MG PO TABS: 10.0000 mg | ORAL_TABLET | Freq: Every day | ORAL | 2 refills | Status: DC

## 2022-05-28 MED ORDER — HYDRALAZINE HCL 100 MG PO TABS: 100.0000 mg | ORAL_TABLET | Freq: Three times a day (TID) | ORAL | 1 refills | Status: DC

## 2022-05-28 MED ORDER — SPIRONOLACTONE 50 MG PO TABS: 50.0000 mg | ORAL_TABLET | Freq: Every day | ORAL | 2 refills | Status: DC

## 2022-05-28 MED ORDER — AMLODIPINE BESYLATE 10 MG PO TABS: 10.0000 mg | ORAL_TABLET | Freq: Every day | ORAL | 2 refills | Status: DC

## 2022-05-28 NOTE — Progress Notes (Unsigned)
Follow up HTN and DM  Rash on arm x 2 weeks Trulicity has back order but has not missed a dose

## 2022-05-28 NOTE — Patient Instructions (Signed)
Trulicity refilled sent downstairs to our pharmacy [facility] no [facility]  No labs needed today  Return to Dr. Joya Gaskins 4 months

## 2022-05-29 ENCOUNTER — Other Ambulatory Visit: Payer: Self-pay

## 2022-05-29 ENCOUNTER — Encounter: Payer: Self-pay | Admitting: Critical Care Medicine

## 2022-05-29 NOTE — Assessment & Plan Note (Signed)
Patient has follow-up eye exam

## 2022-05-29 NOTE — Assessment & Plan Note (Signed)
Diabetes at goal no change in medications

## 2022-05-29 NOTE — Assessment & Plan Note (Signed)
Hypertension is at goal no changes will be made in current medication profile

## 2022-05-30 ENCOUNTER — Other Ambulatory Visit: Payer: Self-pay | Admitting: Critical Care Medicine

## 2022-06-10 ENCOUNTER — Encounter: Payer: Self-pay | Admitting: Podiatry

## 2022-06-10 ENCOUNTER — Ambulatory Visit: Payer: BC Managed Care – PPO | Admitting: Podiatry

## 2022-06-10 VITALS — BP 148/75 | HR 66

## 2022-06-10 DIAGNOSIS — B351 Tinea unguium: Secondary | ICD-10-CM

## 2022-06-10 DIAGNOSIS — E1151 Type 2 diabetes mellitus with diabetic peripheral angiopathy without gangrene: Secondary | ICD-10-CM

## 2022-06-10 DIAGNOSIS — D128 Benign neoplasm of rectum: Secondary | ICD-10-CM

## 2022-06-10 DIAGNOSIS — L84 Corns and callosities: Secondary | ICD-10-CM

## 2022-06-10 DIAGNOSIS — E1169 Type 2 diabetes mellitus with other specified complication: Secondary | ICD-10-CM

## 2022-06-10 NOTE — Progress Notes (Signed)
This patient returns to my office for at risk foot care.  This patient requires this care by a professional since this patient will be at risk due to having diabetes.  This patient is unable to cut nails himself since the patient cannot reach his nails.These nails are painful walking and wearing shoes.  He also has callus on the outside of his left foot.This patient presents for at risk foot care today.  General Appearance  Alert, conversant and in no acute stress.  Vascular  Dorsalis pedis and posterior tibial  pulses are palpable  bilaterally.  Capillary return is within normal limits  bilaterally. Temperature is within normal limits  bilaterally.  Neurologic  Senn-Weinstein monofilament wire test within normal limits  bilaterally. Muscle power within normal limits bilaterally.  Nails Thick disfigured discolored nails with subungual debris  from hallux to fifth toes bilaterally. No evidence of bacterial infection or drainage bilaterally.  Orthopedic  No limitations of motion  feet .  No crepitus or effusions noted.  No bony pathology or digital deformities noted.  Skin  normotropic skin with no porokeratosis noted bilaterally.  No signs of infections or ulcers noted.   Callus lateral aspect fifth metabase left foot.  Onychomycosis  Pain in right toes  Pain in left toes  Callus left foot.  Consent was obtained for treatment procedures.   Mechanical debridement of nails 1-5  bilaterally performed with a nail nipper.  Filed with dremel without incident. Debride callus with # 15 blade and dremel tool.   Return office visit     3  months                 Told patient to return for periodic foot care and evaluation due to potential at risk complications.   Gardiner Barefoot DPM

## 2022-08-30 ENCOUNTER — Other Ambulatory Visit: Payer: Self-pay | Admitting: Critical Care Medicine

## 2022-08-30 NOTE — Telephone Encounter (Signed)
Last RF 03/19/22 #90 1 RF should have enough until June  Requested Prescriptions  Refused Prescriptions Disp Refills   meloxicam (MOBIC) 15 MG tablet [Pharmacy Med Name: meloxicam 15 mg tablet] 90 tablet 1    Sig: TAKE 1 TABLET BY MOUTH EVERY DAY     Analgesics:  COX2 Inhibitors Failed - 08/30/2022  9:21 AM      Failed - Manual Review: Labs are only required if the patient has taken medication for more than 8 weeks.      Passed - HGB in normal range and within 360 days    Hemoglobin  Date Value Ref Range Status  11/19/2021 16.0 13.0 - 17.7 g/dL Final         Passed - Cr in normal range and within 360 days    Creat  Date Value Ref Range Status  01/16/2016 0.95 0.70 - 1.33 mg/dL Final    Comment:      For patients > or = 58 years of age: The upper reference limit for Creatinine is approximately 13% higher for people identified as African-American.      Creatinine, Ser  Date Value Ref Range Status  03/19/2022 1.21 0.76 - 1.27 mg/dL Final   Creatinine, Urine  Date Value Ref Range Status  08/12/2010 201.3 mg/dL Final         Passed - HCT in normal range and within 360 days    Hematocrit  Date Value Ref Range Status  11/19/2021 47.4 37.5 - 51.0 % Final         Passed - AST in normal range and within 360 days    AST  Date Value Ref Range Status  11/19/2021 20 0 - 40 IU/L Final         Passed - ALT in normal range and within 360 days    ALT  Date Value Ref Range Status  11/19/2021 16 0 - 44 IU/L Final         Passed - eGFR is 30 or above and within 360 days    GFR calc Af Amer  Date Value Ref Range Status  11/09/2019 75 >59 mL/min/1.73 Final    Comment:    **[facility] currently reports eGFR in compliance with the current**   recommendations of the [employer]. [facility] will   update reporting as new guidelines are published from the NKF-ASN   Task force.    GFR, Estimated  Date Value Ref Range Status  01/26/2020 >60 >60 mL/min Final   eGFR   Date Value Ref Range Status  03/19/2022 70 >59 mL/min/1.73 Final         Passed - Patient is not pregnant      Passed - Valid encounter within last 12 months    Recent Outpatient Visits           3 months ago Primary hypertension   [place] [facility] Storm Frisk, MD   5 months ago Essential hypertension   [place] [facility] Storm Frisk, MD   9 months ago Type 2 diabetes mellitus with retinopathy, with long-term current use of insulin, macular edema presence unspecified, unspecified laterality, unspecified retinopathy severity [facility])   [place] [facility] Storm Frisk, MD   12 months ago No-show for appointment   [facility] Storm Frisk, MD   1 year ago COVID-19   [facility] [place] Rigg, NP       Future Appointments             In 1 month [place] Charlcie Cradle, MD [facility]

## 2022-09-10 ENCOUNTER — Ambulatory Visit: Payer: BC Managed Care – PPO | Admitting: Podiatry

## 2022-09-11 ENCOUNTER — Ambulatory Visit: Payer: BC Managed Care – PPO | Admitting: Podiatry

## 2022-09-11 ENCOUNTER — Encounter: Payer: Self-pay | Admitting: Podiatry

## 2022-09-11 VITALS — BP 159/79

## 2022-09-11 DIAGNOSIS — E1169 Type 2 diabetes mellitus with other specified complication: Secondary | ICD-10-CM

## 2022-09-11 DIAGNOSIS — E1151 Type 2 diabetes mellitus with diabetic peripheral angiopathy without gangrene: Secondary | ICD-10-CM

## 2022-09-11 DIAGNOSIS — D128 Benign neoplasm of rectum: Secondary | ICD-10-CM | POA: Diagnosis not present

## 2022-09-11 DIAGNOSIS — B351 Tinea unguium: Secondary | ICD-10-CM

## 2022-09-11 NOTE — Progress Notes (Signed)
This patient returns to my office for at risk foot care.  This patient requires this care by a professional since this patient will be at risk due to having diabetes.  This patient is unable to cut nails himself since the patient cannot reach his nails.These nails are painful walking and wearing shoes.  He also has callus on the outside of his left foot.This patient presents for at risk foot care today.  General Appearance  Alert, conversant and in no acute stress.  Vascular  Dorsalis pedis and posterior tibial  pulses are palpable  bilaterally.  Capillary return is within normal limits  bilaterally. Temperature is within normal limits  bilaterally.  Neurologic  Senn-Weinstein monofilament wire test within normal limits  bilaterally. Muscle power within normal limits bilaterally.  Nails Thick disfigured discolored nails with subungual debris  from hallux to fifth toes bilaterally. No evidence of bacterial infection or drainage bilaterally.  Orthopedic  No limitations of motion  feet .  No crepitus or effusions noted.  No bony pathology or digital deformities noted.  Skin  normotropic skin with no porokeratosis noted bilaterally.  No signs of infections or ulcers noted.   Callus lateral aspect fifth metabase left foot.  Onychomycosis  Pain in right toes  Pain in left toes  Callus left foot.  Consent was obtained for treatment procedures.   Mechanical debridement of nails 1-5  bilaterally performed with a nail nipper.  Filed with dremel without incident. Debride callus with # 15 blade and dremel tool.   Return office visit     3  months                 Told patient to return for periodic foot care and evaluation due to potential at risk complications.   Justin Houston DPM   

## 2022-09-12 ENCOUNTER — Other Ambulatory Visit: Payer: Self-pay | Admitting: Critical Care Medicine

## 2022-09-12 ENCOUNTER — Ambulatory Visit: Payer: BC Managed Care – PPO | Admitting: Podiatry

## 2022-09-29 NOTE — Progress Notes (Deleted)
Established Patient Office Visit  Subjective   Patient ID: Justin Houston, male    DOB: [birth year]  Age: 57 y.o. MRN: [id number]  No chief complaint on file.   11/2021 Justin Houston is 57 year old male presenting today for follow up. He has history of type 2 diabetes, diabetic retinopathy and hypertension.   Diabetes medications include: farxiga 5 mg daily, trulicity 0.75 weekly, metformin 1000 mg BID, levemir 40 units BID Today's Hemoglobin A1C= 7.9%, previously 6.7% (09/2020). He does take his blood sugars at home, but needs a new battery for the machine.   He has an upcoming podiatry appointment 01/28/2022 and dentistry appointment 08/25. He recently saw opthalmology in June and reports he is obtaining new glasses soon. He also has a colonoscopy scheduled with Dr Christella Hartigan on 12/27/2021.   Hypertension medications include: amlodipine 10 mg daily, coreg 25 mg BID, chlorthalidone 25 mg daily, hydralazine 100 TID, and atorvastatin 40 mg daily.  Todays blood pressure= 149/80. He does not have a blood pressure machine at home.   Per patient, he has had a hard time recently controlling his diet. He also does not exercise much, aside from walking at his job.   Due for screening labs, hemoglobin A1C, and microalbumin today  12/5 patient seen in return follow-up for hypertension diabetes on arrival blood pressure remains elevated 158/78.  He has been compliant with his medications but sometimes forgets to take the afternoon dose of his carvedilol and hydralazine.  He does need a follow-up shingles vaccination and flu shot he agrees to receive both at this visit.  Blood sugar on arrival is 106 and hemoglobin A1c is in excellent control at 6.5  The patient was started on Aldactone 25 mg daily at the last visit this dose will need to be increased  05/28/22 Patient returns in follow-up after last visit in December prior history of diabetes and hypertension blood pressure on arrival is improved 135/78  A1c is at goal there are no new complaints except for a rash on the right arm  10/01/22     Patient Active Problem List   Diagnosis Date Noted   History of colonic polyps    Benign neoplasm of transverse colon    Benign neoplasm of rectum    Left hip pain 10/03/2020   Varicose veins of both legs with edema 08/31/2018   GERD (gastroesophageal reflux disease) 08/16/2016   Diabetic retinopathy (HCC) 12/08/2014   Umbilical hernia without obstruction and without gangrene 09/29/2013   Glaucoma 05/26/2013   Gout 08/01/2012   BMI 45.0-49.9, adult (HCC) 08/01/2012   Hypertension 07/20/2011   Diabetes mellitus type 2 with retinopathy (HCC) 07/20/2011   Hyperlipidemia associated with type 2 diabetes mellitus (HCC) 07/20/2011   Past Medical History:  Diagnosis Date   Dermatitis    Diabetes mellitus    GERD (gastroesophageal reflux disease)    Gout    Hyperlipidemia    Hypertension    Lipoma of scalp    Mass of head, right postauricular SQ 7x4cm 09/29/2013   Obesity    Toenail fungus 08/31/2018   Past Surgical History:  Procedure Laterality Date   COLONOSCOPY WITH PROPOFOL N/A 12/01/2014   Procedure: COLONOSCOPY WITH PROPOFOL;  Surgeon: Rachael Fee, MD;  Location: [facility];  Service: Endoscopy;  Laterality: N/A;   COLONOSCOPY WITH PROPOFOL N/A 12/20/2021   Procedure: COLONOSCOPY WITH PROPOFOL;  Surgeon: Meryl Dare, MD;  Location: [facility];  Service: Gastroenterology;  Laterality: N/A;   LIPOMA EXCISION  N/A 01/28/2020   Procedure: EXCISION RIGHT SCALP LIPOMA;  Surgeon: Allena Napoleon, MD;  Location: [facility];  Service: Plastics;  Laterality: N/A;   POLYPECTOMY  12/20/2021   Procedure: POLYPECTOMY;  Surgeon: Meryl Dare, MD;  Location: [facility];  Service: Gastroenterology;;   right arm ORIF     Social History   Tobacco Use   Smoking status: Former    Types: Cigarettes    Quit date: 09/05/1994    Years since quitting: 28.0   Smokeless  tobacco: Never  Vaping Use   Vaping Use: Never used  Substance Use Topics   Alcohol use: No    Alcohol/week: 0.0 standard drinks of alcohol   Drug use: No   Family History  Problem Relation Age of Onset   Stroke Mother    Hypertension Mother    Diabetes Maternal Grandmother    Prostate cancer Maternal Grandfather    Diabetes Other    Allergies  Allergen Reactions   Losartan Potassium-Hctz Hives   Cozaar [Losartan Potassium] Hives    Hives       Review of Systems  Constitutional: Negative.  Negative for chills, diaphoresis, fever, malaise/fatigue and weight loss.  HENT: Negative.  Negative for congestion, hearing loss, nosebleeds, sore throat and tinnitus.   Eyes: Negative.  Negative for blurred vision, photophobia and redness.  Respiratory: Negative.  Negative for cough, hemoptysis, sputum production, shortness of breath, wheezing and stridor.   Cardiovascular: Negative.  Negative for chest pain, palpitations, orthopnea, claudication, leg swelling and PND.  Gastrointestinal: Negative.  Negative for abdominal pain, blood in stool, constipation, diarrhea, heartburn, nausea and vomiting.  Genitourinary: Negative.  Negative for dysuria, flank pain, frequency, hematuria and urgency.  Musculoskeletal: Negative.  Negative for back pain, falls, joint pain, myalgias and neck pain.  Skin:  Positive for rash. Negative for itching.  Neurological: Negative.  Negative for dizziness, tingling, tremors, sensory change, speech change, focal weakness, seizures, loss of consciousness, weakness and headaches.  Endo/Heme/Allergies: Negative.  Negative for environmental allergies and polydipsia. Does not bruise/bleed easily.  Psychiatric/Behavioral: Negative.  Negative for depression, memory loss, substance abuse and suicidal ideas. The patient is not nervous/anxious and does not have insomnia.       Objective:     There were no vitals taken for this visit.    Physical Exam Vitals reviewed.   Constitutional:      Appearance: Normal appearance. He is well-developed. He is obese. He is not diaphoretic.  HENT:     Head: Normocephalic and atraumatic.     Right Ear: Tympanic membrane, ear canal and external ear normal.     Left Ear: Tympanic membrane, ear canal and external ear normal.     Nose: No nasal deformity, septal deviation, mucosal edema or rhinorrhea.     Right Sinus: No maxillary sinus tenderness or frontal sinus tenderness.     Left Sinus: No maxillary sinus tenderness or frontal sinus tenderness.     Mouth/Throat:     Mouth: Mucous membranes are moist.     Pharynx: Oropharynx is clear. No oropharyngeal exudate.  Eyes:     General: No scleral icterus.    Conjunctiva/sclera: Conjunctivae normal.     Pupils: Pupils are equal, round, and reactive to light.  Neck:     Thyroid: No thyromegaly.     Vascular: No carotid bruit or JVD.     Trachea: Trachea normal. No tracheal tenderness or tracheal deviation.  Cardiovascular:     Rate and Rhythm:  Normal rate and regular rhythm.     Chest Wall: PMI is not displaced.     Pulses: Normal pulses. No decreased pulses.     Heart sounds: Normal heart sounds, S1 normal and S2 normal. Heart sounds not distant. No murmur heard.    No systolic murmur is present.     No diastolic murmur is present.     No friction rub. No gallop. No S3 or S4 sounds.  Pulmonary:     Effort: Pulmonary effort is normal. No tachypnea, accessory muscle usage or respiratory distress.     Breath sounds: Normal breath sounds. No stridor. No decreased breath sounds, wheezing, rhonchi or rales.  Chest:     Chest wall: No tenderness.  Abdominal:     General: Bowel sounds are normal. There is no distension.     Palpations: Abdomen is soft. Abdomen is not rigid.     Tenderness: There is no abdominal tenderness. There is no guarding or rebound.  Musculoskeletal:        General: Normal range of motion.     Cervical back: Normal range of motion and neck supple.  No edema, erythema or rigidity. No muscular tenderness. Normal range of motion.  Lymphadenopathy:     Head:     Right side of head: No submental or submandibular adenopathy.     Left side of head: No submental or submandibular adenopathy.     Cervical: No cervical adenopathy.  Skin:    General: Skin is warm and dry.     Coloration: Skin is not pale.     Findings: Rash present.     Nails: There is no clubbing.  Neurological:     Mental Status: He is alert and oriented to person, place, and time.     Sensory: No sensory deficit.  Psychiatric:        Mood and Affect: Mood normal.        Speech: Speech normal.        Behavior: Behavior normal.      No results found for any visits on 10/01/22.       The 10-year ASCVD risk score (Arnett DK, et al., 2019) is: 32%    Assessment & Plan:   Problem List Items Addressed This Visit   None No follow-ups on file.   30 minutes spent extra time needing assessing multiple problems and patient education Shan Levans, MD

## 2022-09-30 ENCOUNTER — Telehealth: Payer: Self-pay

## 2022-09-30 NOTE — Telephone Encounter (Signed)
Patient attempted to be outreached by Jala Dundon on 09/30/22 to discuss hypertension. Left voicemail for patient to return our call at their convenience at [phone].  Seon Gaertner, Student-PharmD 

## 2022-10-01 ENCOUNTER — Ambulatory Visit: Payer: BC Managed Care – PPO | Admitting: Critical Care Medicine

## 2022-10-07 ENCOUNTER — Other Ambulatory Visit: Payer: Self-pay | Admitting: Critical Care Medicine

## 2022-10-08 NOTE — Telephone Encounter (Signed)
Requested medication (s) are due for refill today: no last refill doc. 09/25/22  Requested medication (s) are on the active medication list: yes   Last refill:  09/12/22 #30 0 refills  Future visit scheduled: yes in 1 week  Notes to clinic:  no refills remain. Do you want to refill Rx?     Requested Prescriptions  Pending Prescriptions Disp Refills   meloxicam (MOBIC) 15 MG tablet [Pharmacy Med Name: meloxicam 15 mg tablet] 30 tablet 0    Sig: TAKE 1 TABLET BY MOUTH EVERY DAY     Analgesics:  COX2 Inhibitors Failed - 10/07/2022  4:25 PM      Failed - Manual Review: Labs are only required if the patient has taken medication for more than 8 weeks.      Passed - HGB in normal range and within 360 days    Hemoglobin  Date Value Ref Range Status  11/19/2021 16.0 13.0 - 17.7 g/dL Final         Passed - Cr in normal range and within 360 days    Creat  Date Value Ref Range Status  01/16/2016 0.95 0.70 - 1.33 mg/dL Final    Comment:      For patients > or = 58 years of age: The upper reference limit for Creatinine is approximately 13% higher for people identified as African-American.      Creatinine, Ser  Date Value Ref Range Status  03/19/2022 1.21 0.76 - 1.27 mg/dL Final   Creatinine, Urine  Date Value Ref Range Status  08/12/2010 201.3 mg/dL Final         Passed - HCT in normal range and within 360 days    Hematocrit  Date Value Ref Range Status  11/19/2021 47.4 37.5 - 51.0 % Final         Passed - AST in normal range and within 360 days    AST  Date Value Ref Range Status  11/19/2021 20 0 - 40 IU/L Final         Passed - ALT in normal range and within 360 days    ALT  Date Value Ref Range Status  11/19/2021 16 0 - 44 IU/L Final         Passed - eGFR is 30 or above and within 360 days    GFR calc Af Amer  Date Value Ref Range Status  11/09/2019 75 >59 mL/min/1.73 Final    Comment:    **[facility] currently reports eGFR in compliance with the current**    recommendations of the [employer]. [facility] will   update reporting as new guidelines are published from the NKF-ASN   Task force.    GFR, Estimated  Date Value Ref Range Status  01/26/2020 >60 >60 mL/min Final   eGFR  Date Value Ref Range Status  03/19/2022 70 >59 mL/min/1.73 Final         Passed - Patient is not pregnant      Passed - Valid encounter within last 12 months    Recent Outpatient Visits           4 months ago Primary hypertension   [facility] Storm Frisk, MD   6 months ago Essential hypertension   [place] [facility] Storm Frisk, MD   10 months ago Type 2 diabetes mellitus with retinopathy, with long-term current use of insulin, macular edema presence unspecified, unspecified laterality, unspecified retinopathy severity (HCC)  [facility] Storm Frisk, MD   1 year ago No-show for appointment   [facility] Storm Frisk, MD   1 year ago COVID-19   [facility] Claiborne Rigg, NP       Future Appointments             In 1 week Storm Frisk, MD [facility]

## 2022-10-15 ENCOUNTER — Ambulatory Visit: Payer: BC Managed Care – PPO | Admitting: Critical Care Medicine

## 2022-10-15 NOTE — Progress Notes (Deleted)
Established Patient Office Visit  Subjective   Patient ID: Justin Houston, male    DOB: [birth year]  Age: 57 y.o. MRN: [id number]  No chief complaint on file.   11/2021 Justin Houston is 57 year old male presenting today for follow up. He has history of type 2 diabetes, diabetic retinopathy and hypertension.   Diabetes medications include: farxiga 5 mg daily, trulicity 0.75 weekly, metformin 1000 mg BID, levemir 40 units BID Today's Hemoglobin A1C= 7.9%, previously 6.7% (09/2020). He does take his blood sugars at home, but needs a new battery for the machine.   He has an upcoming podiatry appointment 01/28/2022 and dentistry appointment 08/25. He recently saw opthalmology in June and reports he is obtaining new glasses soon. He also has a colonoscopy scheduled with Justin Houston on 12/27/2021.   Hypertension medications include: amlodipine 10 mg daily, coreg 25 mg BID, chlorthalidone 25 mg daily, hydralazine 100 TID, and atorvastatin 40 mg daily.  Todays blood pressure= 149/80. He does not have a blood pressure machine at home.   Per patient, he has had a hard time recently controlling his diet. He also does not exercise much, aside from walking at his job.   Due for screening labs, hemoglobin A1C, and microalbumin today  12/5 patient seen in return follow-up for hypertension diabetes on arrival blood pressure remains elevated 158/78.  He has been compliant with his medications but sometimes forgets to take the afternoon dose of his carvedilol and hydralazine.  He does need a follow-up shingles vaccination and flu shot he agrees to receive both at this visit.  Blood sugar on arrival is 106 and hemoglobin A1c is in excellent control at 6.5  The patient was started on Aldactone 25 mg daily at the last visit this dose will need to be increased  05/28/22 Patient returns in follow-up after last visit in December prior history of diabetes and hypertension blood pressure on arrival is improved 135/78  A1c is at goal there are no new complaints except for a rash on the right arm  10/15/22     Patient Active Problem List   Diagnosis Date Noted   History of colonic polyps    Benign neoplasm of transverse colon    Benign neoplasm of rectum    Left hip pain 10/03/2020   Varicose veins of both legs with edema 08/31/2018   GERD (gastroesophageal reflux disease) 08/16/2016   Diabetic retinopathy (HCC) 12/08/2014   Umbilical hernia without obstruction and without gangrene 09/29/2013   Glaucoma 05/26/2013   Gout 08/01/2012   BMI 45.0-49.9, adult (HCC) 08/01/2012   Hypertension 07/20/2011   Diabetes mellitus type 2 with retinopathy (HCC) 07/20/2011   Hyperlipidemia associated with type 2 diabetes mellitus (HCC) 07/20/2011   Past Medical History:  Diagnosis Date   Dermatitis    Diabetes mellitus    GERD (gastroesophageal reflux disease)    Gout    Hyperlipidemia    Hypertension    Lipoma of scalp    Mass of head, right postauricular SQ 7x4cm 09/29/2013   Obesity    Toenail fungus 08/31/2018   Past Surgical History:  Procedure Laterality Date   COLONOSCOPY WITH PROPOFOL N/A 12/01/2014   Procedure: COLONOSCOPY WITH PROPOFOL;  Surgeon: Rachael Fee, MD;  Location: [facility];  Service: Endoscopy;  Laterality: N/A;   COLONOSCOPY WITH PROPOFOL N/A 12/20/2021   Procedure: COLONOSCOPY WITH PROPOFOL;  Surgeon: Meryl Dare, MD;  Location: [facility];  Service: Gastroenterology;  Laterality: N/A;   LIPOMA EXCISION  N/A 01/28/2020   Procedure: EXCISION RIGHT SCALP LIPOMA;  Surgeon: Allena Napoleon, MD;  Location: [facility];  Service: Plastics;  Laterality: N/A;   POLYPECTOMY  12/20/2021   Procedure: POLYPECTOMY;  Surgeon: Meryl Dare, MD;  Location: [facility];  Service: Gastroenterology;;   right arm ORIF     Social History   Tobacco Use   Smoking status: Former    Types: Cigarettes    Quit date: 09/05/1994    Years since quitting: 28.1   Smokeless  tobacco: Never  Vaping Use   Vaping Use: Never used  Substance Use Topics   Alcohol use: No    Alcohol/week: 0.0 standard drinks of alcohol   Drug use: No   Family History  Problem Relation Age of Onset   Stroke Mother    Hypertension Mother    Diabetes Maternal Grandmother    Prostate cancer Maternal Grandfather    Diabetes Other    Allergies  Allergen Reactions   Losartan Potassium-Hctz Hives   Cozaar [Losartan Potassium] Hives    Hives       Review of Systems  Constitutional: Negative.  Negative for chills, diaphoresis, fever, malaise/fatigue and weight loss.  HENT: Negative.  Negative for congestion, hearing loss, nosebleeds, sore throat and tinnitus.   Eyes: Negative.  Negative for blurred vision, photophobia and redness.  Respiratory: Negative.  Negative for cough, hemoptysis, sputum production, shortness of breath, wheezing and stridor.   Cardiovascular: Negative.  Negative for chest pain, palpitations, orthopnea, claudication, leg swelling and PND.  Gastrointestinal: Negative.  Negative for abdominal pain, blood in stool, constipation, diarrhea, heartburn, nausea and vomiting.  Genitourinary: Negative.  Negative for dysuria, flank pain, frequency, hematuria and urgency.  Musculoskeletal: Negative.  Negative for back pain, falls, joint pain, myalgias and neck pain.  Skin:  Positive for rash. Negative for itching.  Neurological: Negative.  Negative for dizziness, tingling, tremors, sensory change, speech change, focal weakness, seizures, loss of consciousness, weakness and headaches.  Endo/Heme/Allergies: Negative.  Negative for environmental allergies and polydipsia. Does not bruise/bleed easily.  Psychiatric/Behavioral: Negative.  Negative for depression, memory loss, substance abuse and suicidal ideas. The patient is not nervous/anxious and does not have insomnia.       Objective:     There were no vitals taken for this visit.    Physical Exam Vitals reviewed.   Constitutional:      Appearance: Normal appearance. He is well-developed. He is obese. He is not diaphoretic.  HENT:     Head: Normocephalic and atraumatic.     Right Ear: Tympanic membrane, ear canal and external ear normal.     Left Ear: Tympanic membrane, ear canal and external ear normal.     Nose: No nasal deformity, septal deviation, mucosal edema or rhinorrhea.     Right Sinus: No maxillary sinus tenderness or frontal sinus tenderness.     Left Sinus: No maxillary sinus tenderness or frontal sinus tenderness.     Mouth/Throat:     Mouth: Mucous membranes are moist.     Pharynx: Oropharynx is clear. No oropharyngeal exudate.  Eyes:     General: No scleral icterus.    Conjunctiva/sclera: Conjunctivae normal.     Pupils: Pupils are equal, round, and reactive to light.  Neck:     Thyroid: No thyromegaly.     Vascular: No carotid bruit or JVD.     Trachea: Trachea normal. No tracheal tenderness or tracheal deviation.  Cardiovascular:     Rate and Rhythm:  Normal rate and regular rhythm.     Chest Wall: PMI is not displaced.     Pulses: Normal pulses. No decreased pulses.     Heart sounds: Normal heart sounds, S1 normal and S2 normal. Heart sounds not distant. No murmur heard.    No systolic murmur is present.     No diastolic murmur is present.     No friction rub. No gallop. No S3 or S4 sounds.  Pulmonary:     Effort: Pulmonary effort is normal. No tachypnea, accessory muscle usage or respiratory distress.     Breath sounds: Normal breath sounds. No stridor. No decreased breath sounds, wheezing, rhonchi or rales.  Chest:     Chest wall: No tenderness.  Abdominal:     General: Bowel sounds are normal. There is no distension.     Palpations: Abdomen is soft. Abdomen is not rigid.     Tenderness: There is no abdominal tenderness. There is no guarding or rebound.  Musculoskeletal:        General: Normal range of motion.     Cervical back: Normal range of motion and neck supple.  No edema, erythema or rigidity. No muscular tenderness. Normal range of motion.  Lymphadenopathy:     Head:     Right side of head: No submental or submandibular adenopathy.     Left side of head: No submental or submandibular adenopathy.     Cervical: No cervical adenopathy.  Skin:    General: Skin is warm and dry.     Coloration: Skin is not pale.     Findings: Rash present.     Nails: There is no clubbing.  Neurological:     Mental Status: He is alert and oriented to person, place, and time.     Sensory: No sensory deficit.  Psychiatric:        Mood and Affect: Mood normal.        Speech: Speech normal.        Behavior: Behavior normal.      No results found for any visits on 10/15/22.       The 10-year ASCVD risk score (Arnett DK, et al., 2019) is: 32%    Assessment & Plan:   Problem List Items Addressed This Visit   None No follow-ups on file.   30 minutes spent extra time needing assessing multiple problems and patient education Shan Levans, MD

## 2022-10-30 ENCOUNTER — Other Ambulatory Visit: Payer: Self-pay | Admitting: Critical Care Medicine

## 2022-10-30 DIAGNOSIS — I10 Essential (primary) hypertension: Secondary | ICD-10-CM

## 2022-10-31 ENCOUNTER — Other Ambulatory Visit: Payer: Self-pay | Admitting: Critical Care Medicine

## 2022-10-31 DIAGNOSIS — I10 Essential (primary) hypertension: Secondary | ICD-10-CM

## 2022-11-11 ENCOUNTER — Ambulatory Visit: Payer: BC Managed Care – PPO | Admitting: Nurse Practitioner

## 2022-11-22 ENCOUNTER — Other Ambulatory Visit: Payer: Self-pay | Admitting: Critical Care Medicine

## 2022-12-11 ENCOUNTER — Other Ambulatory Visit: Payer: Self-pay | Admitting: Critical Care Medicine

## 2022-12-12 ENCOUNTER — Ambulatory Visit: Payer: BC Managed Care – PPO | Admitting: Podiatry

## 2022-12-18 ENCOUNTER — Telehealth: Payer: Self-pay | Admitting: Pharmacist

## 2022-12-18 NOTE — Telephone Encounter (Signed)
Called patient to schedule an appointment for a blood pressure check. I was unable to reach the patient so I left a HIPAA-compliant message requesting that the patient return my call.   Butch Penny, PharmD, P[place]y [place], CPP Clinical Pharmacist [facility] [phone]

## 2023-01-02 ENCOUNTER — Other Ambulatory Visit: Payer: Self-pay | Admitting: Critical Care Medicine

## 2023-01-02 DIAGNOSIS — I10 Essential (primary) hypertension: Secondary | ICD-10-CM

## 2023-01-03 ENCOUNTER — Telehealth: Payer: Self-pay

## 2023-01-03 NOTE — Telephone Encounter (Signed)
Pharmacy Patient Advocate Encounter   Received notification from CoverMyMeds that prior authorization for TRULICITY is required/requested.   Insurance verification completed.   The patient is insured through [employer] [facility] .   Per test claim: PA required; PA submitted to [employer] [facility] via CoverMyMeds Key/confirmation #/EOC [id number] Status is pending

## 2023-01-06 ENCOUNTER — Other Ambulatory Visit: Payer: Self-pay

## 2023-01-06 ENCOUNTER — Telehealth: Payer: Self-pay

## 2023-01-06 NOTE — Telephone Encounter (Signed)
Pharmacy Patient Advocate Encounter  Received notification from [employer] [facility] that Prior Authorization for TRULICITY has been APPROVED from 01/03/2023 to 01/02/2026   PA #/Case ID/Reference #: [id number]

## 2023-01-17 ENCOUNTER — Other Ambulatory Visit: Payer: Self-pay | Admitting: Critical Care Medicine

## 2023-01-17 DIAGNOSIS — E669 Obesity, unspecified: Secondary | ICD-10-CM

## 2023-01-17 DIAGNOSIS — I10 Essential (primary) hypertension: Secondary | ICD-10-CM

## 2023-01-17 NOTE — Telephone Encounter (Signed)
Medication Refill - Medication: Insulin Glargine [facility] KWIKPEN) 100 UNIT/ML [[id number]]   dapagliflozin propanediol (FARXIGA) 10 MG TABS tablet [[id number]]   metFORMIN (GLUCOPHAGE) 1000 MG tablet [[id number]]   carvedilol (COREG) 25 MG tablet [[id number]]   omeprazole (PRILOSEC) 20 MG capsule [[id number]]   amLODipine (NORVASC) 10 MG tablet [[id number]]    Has the patient contacted their pharmacy? Yes.   (Agent: If no, request that the patient contact the pharmacy for the refill. If patient does not wish to contact the pharmacy document the reason why and proceed with request.) (Agent: If yes, when and what did the pharmacy advise?)  Preferred Pharmacy (with phone number or street name):  [facility] - [place], [place] - [address] Phone: [phone]  Fax: [phone]     Has the patient been seen for an appointment in the last year OR does the patient have an upcoming appointment? Yes.  Apt 02/12/23  Agent: Please be advised that RX refills may take up to 3 business days. We ask that you follow-up with your pharmacy.

## 2023-01-20 MED ORDER — OMEPRAZOLE 20 MG PO CPDR: 20.0000 mg | DELAYED_RELEASE_CAPSULE | Freq: Every day | ORAL | 3 refills | Status: DC

## 2023-01-20 MED ORDER — METFORMIN HCL 1000 MG PO TABS: 1000.0000 mg | ORAL_TABLET | Freq: Two times a day (BID) | ORAL | 0 refills | Status: DC

## 2023-01-20 MED ORDER — AMLODIPINE BESYLATE 10 MG PO TABS: 10.0000 mg | ORAL_TABLET | Freq: Every day | ORAL | 0 refills | Status: DC

## 2023-01-20 MED ORDER — DAPAGLIFLOZIN PROPANEDIOL 10 MG PO TABS: 10.0000 mg | ORAL_TABLET | Freq: Every day | ORAL | 0 refills | Status: DC

## 2023-01-20 MED ORDER — CARVEDILOL 25 MG PO TABS
25.0000 mg | ORAL_TABLET | Freq: Two times a day (BID) | ORAL | 0 refills | Status: DC
Start: 2023-01-20 — End: 2023-02-12

## 2023-01-20 NOTE — Telephone Encounter (Signed)
Requested Prescriptions  Pending Prescriptions Disp Refills   amLODipine (NORVASC) 10 MG tablet 30 tablet 0    Sig: Take 1 tablet (10 mg total) by mouth daily.     Cardiovascular: Calcium Channel Blockers 2 Failed - 01/17/2023  5:25 PM      Failed - Last BP in normal range    BP Readings from Last 1 Encounters:  09/11/22 (!) 159/79         Failed - Valid encounter within last 6 months    Recent Outpatient Visits           7 months ago Primary hypertension   [place] [facility] Storm Frisk, MD   10 months ago Essential hypertension   [place] [facility] & [facility] Storm Frisk, MD   1 year ago Type 2 diabetes mellitus with retinopathy, with long-term current use of insulin, macular edema presence unspecified, unspecified laterality, unspecified retinopathy severity (HCC)   [place] [facility] Storm Frisk, MD   1 year ago No-show for appointment   [facility] Storm Frisk, MD   1 year ago COVID-19   [place] [facility] [place], Shea Stakes, NP       Future Appointments             In 3 weeks Anders Simmonds, [place] [facility]            Passed - Last Heart Rate in normal range    Pulse Readings from Last 1 Encounters:  06/10/22 66          carvedilol (COREG) 25 MG tablet 120 tablet 2    Sig: Take 1 tablet (25 mg total) by mouth 2 (two) times daily with a meal.     Cardiovascular: Beta Blockers 3 Failed - 01/17/2023  5:25 PM      Failed - AST in normal range and within 360 days    AST  Date Value Ref Range Status  11/19/2021 20 0 - 40 IU/L Final         Failed - ALT in normal range and within 360 days    ALT  Date Value Ref Range Status  11/19/2021 16 0 - 44 IU/L Final         Failed - Last BP in normal range    BP Readings from Last 1 Encounters:  09/11/22 (!) 159/79          Failed - Valid encounter within last 6 months    Recent Outpatient Visits           7 months ago Primary hypertension   [place] [facility] Storm Frisk, MD   10 months ago Essential hypertension   [place] [facility] Storm Frisk, MD   1 year ago Type 2 diabetes mellitus with retinopathy, with long-term current use of insulin, macular edema presence unspecified, unspecified laterality, unspecified retinopathy severity [facility])   [place] [facility] Storm Frisk, MD   1 year ago No-show for appointment   [place] Storm Frisk, MD   1 year ago COVID-19   [facility] & [facility] Claiborne Rigg, NP       Future Appointments  In 3 weeks [place] [facility] [place] [facility]            Passed - Cr in normal range and within 360 days    Creat  Date Value Ref Range Status  01/16/2016 0.95 0.70 - 1.33 mg/dL Final    Comment:      For patients > or = 58 years of age: The upper reference limit for Creatinine is approximately 13% higher for people identified as African-American.      Creatinine, Ser  Date Value Ref Range Status  03/19/2022 1.21 0.76 - 1.27 mg/dL Final   Creatinine, Urine  Date Value Ref Range Status  08/12/2010 201.3 mg/dL Final         Passed - Last Heart Rate in normal range    Pulse Readings from Last 1 Encounters:  06/10/22 66          insulin glargine (LANTUS) 100 UNIT/ML injection 20 mL 0     Endocrinology:  Diabetes - Insulins Failed - 01/17/2023  5:25 PM      Failed - HBA1C is between 0 and 7.9 and within 180 days    HbA1c, POC (controlled diabetic range)  Date Value Ref Range Status  03/19/2022 6.5 0.0 - 7.0 % Final         Failed - Valid encounter within last 6 months    Recent Outpatient Visits           7 months ago Primary  hypertension   [place] [facility] Storm Frisk, MD   10 months ago Essential hypertension   [facility] Storm Frisk, MD   1 year ago Type 2 diabetes mellitus with retinopathy, with long-term current use of insulin, macular edema presence unspecified, unspecified laterality, unspecified retinopathy severity (HCC)   [place] [facility] Storm Frisk, MD   1 year ago No-show for appointment   [facility] Storm Frisk, MD   1 year ago COVID-19   [facility] [place], Shea Stakes, NP       Future Appointments             In 3 weeks Anders Simmonds, PA-C [place] [facility]             metFORMIN (GLUCOPHAGE) 1000 MG tablet 120 tablet 1    Sig: Take 1 tablet (1,000 mg total) by mouth 2 (two) times daily with a meal.     Endocrinology:  Diabetes - Biguanides Failed - 01/17/2023  5:25 PM      Failed - HBA1C is between 0 and 7.9 and within 180 days    HbA1c, POC (controlled diabetic range)  Date Value Ref Range Status  03/19/2022 6.5 0.0 - 7.0 % Final         Failed - B12 Level in normal range and within 720 days    No results found for: "VITAMINB12"       Failed - Valid encounter within last 6 months    Recent Outpatient Visits           7 months ago Primary hypertension   [place] [facility] Storm Frisk, MD   10 months ago Essential hypertension   [place] [facility] Storm Frisk, MD   1 year ago  Type 2 diabetes mellitus with retinopathy, with long-term current use of insulin, macular edema presence unspecified, unspecified laterality, unspecified retinopathy severity (HCC)   [place] [facility] & [facility] Storm Frisk, MD   1 year ago No-show for appointment   [facility] Storm Frisk, MD   1 year ago COVID-19   [facility] [place], Shea Stakes, NP       Future Appointments             In 3 weeks Anders Simmonds, [place] [facility]            Failed - CBC within normal limits and completed in the last 12 months    WBC  Date Value Ref Range Status  11/19/2021 6.2 3.4 - 10.8 x10E3/uL Final  01/16/2016 6.0 3.8 - 10.8 K/uL Final   RBC  Date Value Ref Range Status  11/19/2021 5.25 4.14 - 5.80 x10E6/uL Final  01/16/2016 4.97 4.20 - 5.80 MIL/uL Final   Hemoglobin  Date Value Ref Range Status  11/19/2021 16.0 13.0 - 17.7 g/dL Final   Hematocrit  Date Value Ref Range Status  11/19/2021 47.4 37.5 - 51.0 % Final   MCHC  Date Value Ref Range Status  11/19/2021 33.8 31.5 - 35.7 g/dL Final  56/21/3086 57.8 32.0 - 36.0 g/dL Final   [facility]  Date Value Ref Range Status  11/19/2021 30.5 26.6 - 33.0 pg Final  01/16/2016 30.2 27.0 - 33.0 pg Final   MCV  Date Value Ref Range Status  11/19/2021 90 79 - 97 fL Final   Platelet Count, POC  Date Value Ref Range Status  01/24/2014 206 142 - 424 K/uL Final   RDW  Date Value Ref Range Status  11/19/2021 13.3 11.6 - 15.4 % Final   RDW, POC  Date Value Ref Range Status  01/24/2014 13.3 % Final         Passed - Cr in normal range and within 360 days    Creat  Date Value Ref Range Status  01/16/2016 0.95 0.70 - 1.33 mg/dL Final    Comment:      For patients > or = 58 years of age: The upper reference limit for Creatinine is approximately 13% higher for people identified as African-American.      Creatinine, Ser  Date Value Ref Range Status  03/19/2022 1.21 0.76 - 1.27 mg/dL Final   Creatinine, Urine  Date Value Ref Range Status  08/12/2010 201.3 mg/dL Final         Passed - eGFR in normal range and within 360 days    GFR calc Af Amer  Date Value Ref Range Status  11/09/2019 75 >59 mL/min/1.73 Final     Comment:    **[facility] currently reports eGFR in compliance with the current**   recommendations of the [employer]. [facility] will   update reporting as new guidelines are published from the NKF-ASN   Task force.    GFR, Estimated  Date Value Ref Range Status  01/26/2020 >60 >60 mL/min Final   eGFR  Date Value Ref Range Status  03/19/2022 70 >59 mL/min/1.73 Final          omeprazole (PRILOSEC) 20 MG capsule 60 capsule 3    Sig: Take 1 capsule (20 mg total) by mouth daily.     Gastroenterology: Proton Pump Inhibitors Passed - 01/17/2023  5:25 PM  Passed - Valid encounter within last 12 months    Recent Outpatient Visits           7 months ago Primary hypertension   [place] [facility] Storm Frisk, MD   10 months ago Essential hypertension   [place] [facility] Storm Frisk, MD   1 year ago Type 2 diabetes mellitus with retinopathy, with long-term current use of insulin, macular edema presence unspecified, unspecified laterality, unspecified retinopathy severity [facility])   [place] [facility] & [facility] Storm Frisk, MD   1 year ago No-show for appointment   [facility] Storm Frisk, MD   1 year ago COVID-19   [facility] [place], Shea Stakes, NP       Future Appointments             In 3 weeks Anders Simmonds, [place] [facility]             dapagliflozin propanediol (FARXIGA) 10 MG TABS tablet 90 tablet 2    Sig: Take 1 tablet (10 mg total) by mouth daily.     Endocrinology:  Diabetes - SGLT2 Inhibitors Failed - 01/17/2023  5:25 PM      Failed - HBA1C is between 0 and 7.9 and within 180 days    HbA1c, POC (controlled diabetic range)  Date Value Ref Range Status  03/19/2022 6.5 0.0 - 7.0 % Final         Failed - Valid encounter within last 6 months     Recent Outpatient Visits           7 months ago Primary hypertension   [place] Storm [facility]isk, MD   10 months ago Essential hypertension   [place] [facility] Storm Frisk, MD   1 year ago Type 2 diabetes mellitus with retinopathy, with long-term current use of insulin, macular edema presence unspecified, unspecified laterality, unspecified retinopathy severity [facility])   [place] [facility] Storm Frisk, MD   1 year ago No-show for appointment   [facility] Storm Frisk, MD   1 year ago COVID-19   [facility] & [facility] Claiborne Rigg, NP       Future Appointments             In 3 weeks Anders Simmonds, [place] [facility]            Passed - Cr in normal range and within 360 days    Creat  Date Value Ref Range Status  01/16/2016 0.95 0.70 - 1.33 mg/dL Final    Comment:      For patients > or = 58 years of age: The upper reference limit for Creatinine is approximately 13% higher for people identified as African-American.      Creatinine, Ser  Date Value Ref Range Status  03/19/2022 1.21 0.76 - 1.27 mg/dL Final   Creatinine, Urine  Date Value Ref Range Status  08/12/2010 201.3 mg/dL Final         Passed - eGFR in normal range and within 360 days    GFR calc Af Amer  Date Value Ref Range Status  11/09/2019 75 >59 mL/min/1.73 Final    Comment:    **  [facility] currently reports eGFR in compliance with the current**   recommendations of the [employer]. [facility] will   update reporting as new guidelines are published from the NKF-ASN   Task force.    GFR, Estimated  Date Value Ref Range Status  01/26/2020 >60 >60 mL/min Final   eGFR  Date Value Ref Range Status  03/19/2022 70 >59 mL/min/1.73 Final

## 2023-01-20 NOTE — Telephone Encounter (Signed)
Requested medications are due for refill today.  yes  Requested medications are on the active medications list.  yes  Last refill. 05/28/2022  Future visit scheduled.   yes  Notes to clinic.  Labs are expired.    Requested Prescriptions  Pending Prescriptions Disp Refills   carvedilol (COREG) 25 MG tablet 120 tablet 2    Sig: Take 1 tablet (25 mg total) by mouth 2 (two) times daily with a meal.     Cardiovascular: Beta Blockers 3 Failed - 01/17/2023  5:25 PM      Failed - AST in normal range and within 360 days    AST  Date Value Ref Range Status  11/19/2021 20 0 - 40 IU/L Final         Failed - ALT in normal range and within 360 days    ALT  Date Value Ref Range Status  11/19/2021 16 0 - 44 IU/L Final         Failed - Last BP in normal range    BP Readings from Last 1 Encounters:  09/11/22 (!) 159/79         Failed - Valid encounter within last 6 months    Recent Outpatient Visits           7 months ago Primary hypertension   [facility] Storm Frisk, MD   10 months ago Essential hypertension   [facility] Storm Frisk, MD   1 year ago Type 2 diabetes mellitus with retinopathy, with long-term current use of insulin, macular edema presence unspecified, unspecified laterality, unspecified retinopathy severity [facility])   [place] [facility] Storm Frisk, MD   1 year ago No-show for appointment   [facility] Storm Frisk, MD   1 year ago COVID-19   [facility] Claiborne Rigg, NP       Future Appointments             In 3 weeks Anders Simmonds, [place] [facility]            Passed - Cr in normal range and within 360 days    Creat  Date Value Ref Range Status  01/16/2016 0.95 0.70 - 1.33 mg/dL Final    Comment:      For patients > or = 58  years of age: The upper reference limit for Creatinine is approximately 13% higher for people identified as African-American.      Creatinine, Ser  Date Value Ref Range Status  03/19/2022 1.21 0.76 - 1.27 mg/dL Final   Creatinine, Urine  Date Value Ref Range Status  08/12/2010 201.3 mg/dL Final         Passed - Last Heart Rate in normal range    Pulse Readings from Last 1 Encounters:  06/10/22 66          metFORMIN (GLUCOPHAGE) 1000 MG tablet 120 tablet 1    Sig: Take 1 tablet (1,000 mg total) by mouth 2 (two) times daily with a meal.     Endocrinology:  Diabetes - Biguanides Failed - 01/17/2023  5:25 PM      Failed - HBA1C is between 0 and 7.9 and within 180 days    HbA1c, POC (controlled diabetic range)  Date Value Ref Range Status  03/19/2022 6.5 0.0 - 7.0 % Final  Failed - B12 Level in normal range and within 720 days    No results found for: "VITAMINB12"       Failed - Valid encounter within last 6 months    Recent Outpatient Visits           7 months ago Primary hypertension   [place] [facility] Storm Frisk, MD   10 months ago Essential hypertension   [facility] Storm Frisk, MD   1 year ago Type 2 diabetes mellitus with retinopathy, with long-term current use of insulin, macular edema presence unspecified, unspecified laterality, unspecified retinopathy severity [facility])   [place] [facility] Storm Frisk, MD   1 year ago No-show for appointment   [facility]isk, MD   1 year ago COVID-19   [facility] [place], Shea Stakes, NP       Future Appointments             In 3 weeks Anders Simmonds, [place] [facility]            Failed - CBC within normal limits and completed in the last 12 months    WBC  Date Value Ref Range Status   11/19/2021 6.2 3.4 - 10.8 x10E3/uL Final  01/16/2016 6.0 3.8 - 10.8 K/uL Final   RBC  Date Value Ref Range Status  11/19/2021 5.25 4.14 - 5.80 x10E6/uL Final  01/16/2016 4.97 4.20 - 5.80 MIL/uL Final   Hemoglobin  Date Value Ref Range Status  11/19/2021 16.0 13.0 - 17.7 g/dL Final   Hematocrit  Date Value Ref Range Status  11/19/2021 47.4 37.5 - 51.0 % Final   MCHC  Date Value Ref Range Status  11/19/2021 33.8 31.5 - 35.7 g/dL Final  16/01/9603 54.0 32.0 - 36.0 g/dL Final   [facility]  Date Value Ref Range Status  11/19/2021 30.5 26.6 - 33.0 pg Final  01/16/2016 30.2 27.0 - 33.0 pg Final   MCV  Date Value Ref Range Status  11/19/2021 90 79 - 97 fL Final   Platelet Count, POC  Date Value Ref Range Status  01/24/2014 206 142 - 424 K/uL Final   RDW  Date Value Ref Range Status  11/19/2021 13.3 11.6 - 15.4 % Final   RDW, POC  Date Value Ref Range Status  01/24/2014 13.3 % Final         Passed - Cr in normal range and within 360 days    Creat  Date Value Ref Range Status  01/16/2016 0.95 0.70 - 1.33 mg/dL Final    Comment:      For patients > or = 58 years of age: The upper reference limit for Creatinine is approximately 13% higher for people identified as African-American.      Creatinine, Ser  Date Value Ref Range Status  03/19/2022 1.21 0.76 - 1.27 mg/dL Final   Creatinine, Urine  Date Value Ref Range Status  08/12/2010 201.3 mg/dL Final         Passed - eGFR in normal range and within 360 days    GFR calc Af Amer  Date Value Ref Range Status  11/09/2019 75 >59 mL/min/1.73 Final    Comment:    **[facility] currently reports eGFR in compliance with the current**   recommendations of the [employer]. [facility] will   update  reporting as new guidelines are published from the NKF-ASN   Task force.    GFR, Estimated  Date Value Ref Range Status  01/26/2020 >60 >60 mL/min Final   eGFR  Date Value Ref Range Status  03/19/2022 70 >59  mL/min/1.73 Final         Signed Prescriptions Disp Refills   amLODipine (NORVASC) 10 MG tablet 30 tablet 0    Sig: Take 1 tablet (10 mg total) by mouth daily.     Cardiovascular: Calcium Channel Blockers 2 Failed - 01/17/2023  5:25 PM      Failed - Last BP in normal range    BP Readings from Last 1 Encounters:  09/11/22 (!) 159/79         Failed - Valid encounter within last 6 months    Recent Outpatient Visits           7 months ago Primary hypertension   [place] [facility] Storm Frisk, MD   10 months ago Essential hypertension   [place] [facility] Storm Frisk, MD   1 year ago Type 2 diabetes mellitus with retinopathy, with long-term current use of insulin, macular edema presence unspecified, unspecified laterality, unspecified retinopathy severity [facility])   [place] [facility] Storm Frisk, MD   1 year ago No-show for appointment   [facility] Storm Frisk, MD   1 year ago COVID-19   [facility] Claiborne Rigg, NP       Future Appointments             In 3 weeks [facility]            Passed - Last Heart Rate in normal range    Pulse Readings from Last 1 Encounters:  06/10/22 66          omeprazole (PRILOSEC) 20 MG capsule 30 capsule 3    Sig: Take 1 capsule (20 mg total) by mouth daily.     Gastroenterology: Proton Pump Inhibitors Passed - 01/17/2023  5:25 PM      Passed - Valid encounter within last 12 months    Recent Outpatient Visits           7 months ago Primary hypertension   [place] [facility] Frisk, MD   10 months ago Essential hypertension   [place] [facility] Storm Frisk, MD   1 year ago Type 2 diabetes mellitus with retinopathy, with  long-term current use of insulin, macular edema presence unspecified, unspecified laterality, unspecified retinopathy severity [facility])   [place] [facility] Storm Frisk, MD   1 year ago No-show for appointment   [facility] Storm Frisk, MD   1 year ago COVID-19   [facility] [place], Shea Stakes, NP       Future Appointments             In 3 weeks Anders Simmonds, [place] [facility]             dapagliflozin propanediol (FARXIGA) 10 MG TABS tablet 30 tablet 0    Sig: Take 1 tablet (10 mg total) by mouth daily.  Endocrinology:  Diabetes - SGLT2 Inhibitors Failed - 01/17/2023  5:25 PM      Failed - HBA1C is between 0 and 7.9 and within 180 days    HbA1c, POC (controlled diabetic range)  Date Value Ref Range Status  03/19/2022 6.5 0.0 - 7.0 % Final         Failed - Valid encounter within last 6 months    Recent Outpatient Visits           7 months ago Primary hypertension   [place] [facility] Storm Frisk, MD   10 months ago Essential hypertension   [place] Storm Frisk, MD   1 year ago Type 2 diabetes mellitus with retinopathy, with long-term current use of insulin, macular edema presence unspecified, unspecified laterality, unspecified retinopathy severity [facility])   [place] [facility] Storm Frisk, MD   1 year ago No-show for appointment   [facility], MD   1 year ago COVID-19   [facility] Claiborne Rigg, NP       Future Appointments             In 3 weeks Anders Simmonds, [place] [facility]            Passed - Cr in normal range and within 360 days    Creat  Date Value Ref Range Status   01/16/2016 0.95 0.70 - 1.33 mg/dL Final    Comment:      For patients > or = 58 years of age: The upper reference limit for Creatinine is approximately 13% higher for people identified as African-American.      Creatinine, Ser  Date Value Ref Range Status  03/19/2022 1.21 0.76 - 1.27 mg/dL Final   Creatinine, Urine  Date Value Ref Range Status  08/12/2010 201.3 mg/dL Final         Passed - eGFR in normal range and within 360 days    GFR calc Af Amer  Date Value Ref Range Status  11/09/2019 75 >59 mL/min/1.73 Final    Comment:    **[facility] currently reports eGFR in compliance with the current**   recommendations of the [employer]. [facility] will   update reporting as new guidelines are published from the NKF-ASN   Task force.    GFR, Estimated  Date Value Ref Range Status  01/26/2020 >60 >60 mL/min Final   eGFR  Date Value Ref Range Status  03/19/2022 70 >59 mL/min/1.73 Final         Refused Prescriptions Disp Refills   insulin glargine (LANTUS) 100 UNIT/ML injection 20 mL 0     Endocrinology:  Diabetes - Insulins Failed - 01/17/2023  5:25 PM      Failed - HBA1C is between 0 and 7.9 and within 180 days    HbA1c, POC (controlled diabetic range)  Date Value Ref Range Status  03/19/2022 6.5 0.0 - 7.0 % Final         Failed - Valid encounter within last 6 months    Recent Outpatient Visits           7 months ago Primary hypertension   [facility] Storm Frisk, MD   10 months ago Essential hypertension   [facility] Shan Levans  E, MD   1 year ago Type 2 diabetes mellitus with retinopathy, with long-term current use of insulin, macular edema presence unspecified, unspecified laterality, unspecified retinopathy severity [facility])   [facility] Storm Frisk, MD   1 year ago No-show for appointment   [facility] Storm Frisk, MD   1 year ago COVID-19   [facility] Claiborne Rigg, NP       Future Appointments             In 3 weeks Anders Simmonds, PA-C [employer] [facility]             c

## 2023-02-12 ENCOUNTER — Ambulatory Visit: Payer: BC Managed Care – PPO | Admitting: Physician Assistant

## 2023-02-12 ENCOUNTER — Other Ambulatory Visit: Payer: Self-pay | Admitting: Critical Care Medicine

## 2023-02-12 DIAGNOSIS — I10 Essential (primary) hypertension: Secondary | ICD-10-CM

## 2023-02-18 ENCOUNTER — Ambulatory Visit: Payer: BC Managed Care – PPO | Admitting: Physician Assistant

## 2023-02-18 ENCOUNTER — Ambulatory Visit: Payer: Self-pay | Admitting: *Deleted

## 2023-02-18 ENCOUNTER — Encounter: Payer: Self-pay | Admitting: Physician Assistant

## 2023-02-18 VITALS — BP 149/78 | HR 80 | Ht 71.0 in | Wt 307.0 lb

## 2023-02-18 DIAGNOSIS — E11319 Type 2 diabetes mellitus with unspecified diabetic retinopathy without macular edema: Secondary | ICD-10-CM | POA: Diagnosis not present

## 2023-02-18 DIAGNOSIS — I10 Essential (primary) hypertension: Secondary | ICD-10-CM | POA: Diagnosis not present

## 2023-02-18 DIAGNOSIS — Z125 Encounter for screening for malignant neoplasm of prostate: Secondary | ICD-10-CM

## 2023-02-18 DIAGNOSIS — M109 Gout, unspecified: Secondary | ICD-10-CM

## 2023-02-18 DIAGNOSIS — Z794 Long term (current) use of insulin: Secondary | ICD-10-CM | POA: Diagnosis not present

## 2023-02-18 DIAGNOSIS — Z7984 Long term (current) use of oral hypoglycemic drugs: Secondary | ICD-10-CM

## 2023-02-18 DIAGNOSIS — Z7985 Long-term (current) use of injectable non-insulin antidiabetic drugs: Secondary | ICD-10-CM

## 2023-02-18 MED ORDER — PREDNISONE 20 MG PO TABS: ORAL_TABLET | ORAL | 0 refills | Status: AC

## 2023-02-18 MED ORDER — INDOMETHACIN 50 MG PO CAPS: 50.0000 mg | ORAL_CAPSULE | Freq: Three times a day (TID) | ORAL | 0 refills | Status: DC

## 2023-02-18 NOTE — Telephone Encounter (Signed)
Summary: pain in big toe & ankle   Per agent: "Patient called and states he is having foot pain, and he is concerned being a diabetic, he states the pain is in his ankle & big toe & rated the pain at a 5/6, states it is moderate. Pain when walking some. Please contact patient back @ # [phone].  No appointment's with [facility]."         Chief Complaint: Ankle, toe pain Symptoms: Left ankle, great and second toe painful, 5-6/10, worse in mornings slight swelling Frequency: week Pertinent Negatives: Patient denies redness, warmth Disposition: [] ED /[] Urgent Care (no appt availability in office) / [] Appointment(In office/virtual)/ []  [facility]/ [] Home Care/ [] Refused Recommended Disposition /[x] [facility]/ []  Follow-up with PCP Additional Notes:  No availability, advised [facility], states will follow disposition.  Reason for Disposition  Pain in the big toe joint  Answer Assessment - Initial Assessment Questions 1. ONSET: "When did the pain start?"      Monday 2. LOCATION: "Where is the pain located?"      Left ankle and big toe and second toe 3. PAIN: "How bad is the pain?"    (Scale 1-10; or mild, moderate, severe)  - MILD (1-3): doesn't interfere with normal activities.   - MODERATE (4-7): interferes with normal activities (e.g., work or school) or awakens from sleep, limping.   - SEVERE (8-10): excruciating pain, unable to do any normal activities, unable to walk.      5-6/10. More painful in AM 4. WORK OR EXERCISE: "Has there been any recent work or exercise that involved this part of the body?"      No 5. CAUSE: "What do you think is causing the foot pain?"     Unsure 6. OTHER SYMPTOMS: "Do you have any other symptoms?" (e.g., leg pain, rash, fever, numbness)     Toes stiff, mild swelling toe joint  Protocols used: Foot Pain-A-AH

## 2023-02-18 NOTE — Telephone Encounter (Signed)
Noted  

## 2023-02-18 NOTE — Progress Notes (Unsigned)
   Established Patient Office Visit  Subjective   Patient ID: Justin Houston, male    DOB: 1965/12/06  Age: 57 y.o. MRN: [id number]  Chief Complaint  Patient presents with   Foot Pain    Top and side of left foot, patient states pain in the back side of leg     pain in big toe & ankle     Per agent: "Patient called and states he is having foot pain, and he is concerned being a diabetic, he states the pain is in his ankle & big toe & rated the pain at a 5/6, states it is moderate. Pain when walking some.   Sunday started  - no injury or trauma  Tender in mornig when first get  up   Taken aleve and epsom salt -   BG levels - low 100's walks a lot at work   Checking BP at has not taken afternoon BP at home wnl   2-3 bottles a day     {History (Optional):23778}  ROS    Objective:     BP (!) 168/87 (BP Location: Left Arm, Patient Position: Sitting, Cuff Size: Large)   Pulse 83   Ht 5\' 11"  (1.803 m)   Wt (!) 307 lb (139.3 kg)   SpO2 97%   BMI 42.82 kg/m  {Vitals History (Optional):23777}  Physical Exam   No results found for any visits on 02/18/23.  {Labs (Optional):23779}  The 10-year ASCVD risk score (Arnett DK, et al., 2019) is: 34.9%    Assessment & Plan:   Problem List Items Addressed This Visit   None   No follow-ups on file.    Kasandra Knudsen Mayers, PA-C

## 2023-02-18 NOTE — Patient Instructions (Signed)
You are going to take a prednisone taper, and use indomethacin 3 times a day.  You will continue the indomethacin for 2 to 3 days after your swelling resolves.  I do encourage you to increase your water intake.  You should be drinking at least 64 ounces of water a day.  We will call you with today's lab results.  Your blood pressure is elevated today, continue to check your blood pressure at home, keeping a written log and having available for all office visits.  Roney Jaffe, PA-C Physician Assistant [facility] [email or web address]  Gout  Gout is a condition that causes painful swelling of the joints. Gout is a type of inflammation of the joints (arthritis). This condition is caused by having too much uric acid in the body. Uric acid is a chemical that forms when the body breaks down substances called purines. Purines are important for building body proteins. When the body has too much uric acid, sharp crystals can form and build up inside the joints. This causes pain and swelling. Gout attacks can happen quickly and may be very painful (acute gout). Over time, the attacks can affect more joints and become more frequent (chronic gout). Gout can also cause uric acid to build up under the skin and inside the kidneys. What are the causes? This condition is caused by too much uric acid in your blood. This can happen because: Your kidneys do not remove enough uric acid from your blood. This is the most common cause. Your body makes too much uric acid. This can happen with some cancers and cancer treatments. It can also occur if your body is breaking down too many red blood cells (hemolytic anemia). You eat too many foods that are high in purines. These foods include organ meats and some seafood. Alcohol, especially beer, is also high in purines. A gout attack may be triggered by trauma or stress. What increases the risk? The following factors  may make you more likely to develop this condition: Having a family history of gout. Being male and middle-aged. Being male and having gone through menopause. Taking certain medicines, including aspirin, cyclosporine, diuretics, levodopa, and niacin. Having an organ transplant. Having certain conditions, such as: Being obese. Lead poisoning. Kidney disease. A skin condition called psoriasis. Other factors include: Losing weight too quickly. Being dehydrated. Frequently drinking alcohol, especially beer. Frequently drinking beverages that are sweetened with a type of sugar called fructose. What are the signs or symptoms? An attack of acute gout happens quickly. It usually occurs in just one joint. The most common place is the big toe. Attacks often start at night. Other joints that may be affected include joints of the feet, ankle, knee, fingers, wrist, or elbow. Symptoms of this condition may include: Severe pain. Warmth. Swelling. Stiffness. Tenderness. The affected joint may be very painful to touch. Shiny, red, or purple skin. Chills and fever. Chronic gout may cause symptoms more frequently. More joints may be involved. You may also have white or yellow lumps (tophi) on your hands or feet or in other areas near your joints. How is this diagnosed? This condition is diagnosed based on your symptoms, your medical history, and a physical exam. You may have tests, such as: Blood tests to measure uric acid levels. Removal of joint fluid with a thin needle (aspiration) to look for uric acid crystals. X-rays to look for joint damage. How is this treated? Treatment for this condition has two phases: treating an acute  attack and preventing future attacks. Acute gout treatment may include medicines to reduce pain and swelling, including: NSAIDs, such as ibuprofen. Steroids. These are strong anti-inflammatory medicines that can be taken by mouth (orally) or injected into a  joint. Colchicine. This medicine relieves pain and swelling when it is taken soon after an attack. It can be given by mouth or through an IV. Preventive treatment may include: Daily use of smaller doses of NSAIDs or colchicine. Use of a medicine that reduces uric acid levels in your blood, such as allopurinol. Changes to your diet. You may need to see a dietitian about what to eat and drink to prevent gout. Follow these instructions at home: During a gout attack  If directed, put ice on the affected area. To do this: Put ice in a plastic bag. Place a towel between your skin and the bag. Leave the ice on for 20 minutes, 2-3 times a day. Remove the ice if your skin turns bright red. This is very important. If you cannot feel pain, heat, or cold, you have a greater risk of damage to the area. Raise (elevate) the affected joint above the level of your heart as often as possible. Rest the joint as much as possible. If the affected joint is in your leg, you may be given crutches to use. Follow instructions from your health care provider about eating or drinking restrictions. Avoiding future gout attacks Follow a low-purine diet as told by your dietitian or health care provider. Avoid foods and drinks that are high in purines, including liver, kidney, anchovies, asparagus, herring, mushrooms, mussels, and beer. Maintain a healthy weight or lose weight if you are overweight. If you want to lose weight, talk with your health care provider. Do not lose weight too quickly. Start or maintain an exercise program as told by your health care provider. Eating and drinking Avoid drinking beverages that contain fructose. Drink enough fluids to keep your urine pale yellow. If you drink alcohol: Limit how much you have to: 0-1 drink a day for women who are not pregnant. 0-2 drinks a day for men. Know how much alcohol is in a drink. In the [place] one drink equals one 12 oz bottle of beer (355 mL), one 5 oz  glass of wine (148 mL), or one 1 oz glass of hard liquor (44 mL). General instructions Take over-the-counter and prescription medicines only as told by your health care provider. Ask your health care provider if the medicine prescribed to you requires you to avoid driving or using machinery. Return to your normal activities as told by your health care provider. Ask your health care provider what activities are safe for you. Keep all follow-up visits. This is important. Where to find more information [employer]: [email or web address]/ Contact a health care provider if you have: Another gout attack. Continuing symptoms of a gout attack after 10 days of treatment. Side effects from your medicines. Chills or a fever. Burning pain when you urinate. Pain in your lower back or abdomen. Get help right away if you: Have severe or uncontrolled pain. Cannot urinate. Summary Gout is painful swelling of the joints caused by having too much uric acid in the body. The most common site for gout to occur is in the big toe, but it can affect other joints in the body. Medicines and dietary changes can help to prevent and treat gout attacks. This information is not intended to replace advice given to you by your health care  provider. Make sure you discuss any questions you have with your health care provider. Document Revised: 01/03/2021 Document Reviewed: 01/03/2021 Elsevier Patient Education  2024 [employer].

## 2023-02-19 ENCOUNTER — Encounter: Payer: Self-pay | Admitting: Physician Assistant

## 2023-02-19 LAB — COMP. METABOLIC PANEL (12)
AST: 16 [IU]/L (ref 0–40)
Albumin: 3.8 g/dL (ref 3.8–4.9)
Alkaline Phosphatase: 114 [IU]/L (ref 44–121)
BUN/Creatinine Ratio: 19 (ref 9–20)
BUN: 24 mg/dL (ref 6–24)
Bilirubin Total: 0.8 mg/dL (ref 0.0–1.2)
Calcium: 9.4 mg/dL (ref 8.7–10.2)
Chloride: 101 mmol/L (ref 96–106)
Creatinine, Ser: 1.28 mg/dL — ABNORMAL HIGH (ref 0.76–1.27)
Globulin, Total: 3.4 g/dL (ref 1.5–4.5)
Glucose: 132 mg/dL — ABNORMAL HIGH (ref 70–99)
Potassium: 4.2 mmol/L (ref 3.5–5.2)
Sodium: 140 mmol/L (ref 134–144)
Total Protein: 7.2 g/dL (ref 6.0–8.5)
eGFR: 65 mL/min/{1.73_m2} (ref >59)

## 2023-02-19 LAB — CBC WITH DIFFERENTIAL/PLATELET
Basophils Absolute: 0 10*3/uL (ref 0.0–0.2)
Basos: 0 %
EOS (ABSOLUTE): 0.1 10*3/uL (ref 0.0–0.4)
Eos: 1 %
Hematocrit: 44.2 % (ref 37.5–51.0)
Hemoglobin: 14.7 g/dL (ref 13.0–17.7)
Immature Grans (Abs): 0 10*3/uL (ref 0.0–0.1)
Immature Granulocytes: 0 %
Lymphocytes Absolute: 1.8 10*3/uL (ref 0.7–3.1)
Lymphs: 24 %
MCH: 30.3 pg (ref 26.6–33.0)
MCHC: 33.3 g/dL (ref 31.5–35.7)
MCV: 91 fL (ref 79–97)
Monocytes Absolute: 0.6 10*3/uL (ref 0.1–0.9)
Monocytes: 8 %
Neutrophils Absolute: 5.1 10*3/uL (ref 1.4–7.0)
Neutrophils: 67 %
Platelets: 212 10*3/uL (ref 150–450)
RBC: 4.85 x10E6/uL (ref 4.14–5.80)
RDW: 12.9 % (ref 11.6–15.4)
WBC: 7.6 10*3/uL (ref 3.4–10.8)

## 2023-02-19 LAB — HEMOGLOBIN A1C
Est. average glucose Bld gHb Est-mCnc: 154 mg/dL
Hgb A1c MFr Bld: 7 % — ABNORMAL HIGH (ref 4.8–5.6)

## 2023-02-19 LAB — URIC ACID: Uric Acid: 8.6 mg/dL — ABNORMAL HIGH (ref 3.8–8.4)

## 2023-02-19 LAB — PSA: Prostate Specific Ag, Serum: 0.4 ng/mL (ref 0.0–4.0)

## 2023-03-05 ENCOUNTER — Ambulatory Visit: Payer: BC Managed Care – PPO | Attending: Physician Assistant | Admitting: Physician Assistant

## 2023-03-05 ENCOUNTER — Encounter: Payer: Self-pay | Admitting: Physician Assistant

## 2023-03-05 VITALS — BP 130/70 | HR 68 | Wt 313.0 lb

## 2023-03-05 DIAGNOSIS — E785 Hyperlipidemia, unspecified: Secondary | ICD-10-CM | POA: Diagnosis not present

## 2023-03-05 DIAGNOSIS — M109 Gout, unspecified: Secondary | ICD-10-CM | POA: Diagnosis not present

## 2023-03-05 DIAGNOSIS — E1165 Type 2 diabetes mellitus with hyperglycemia: Secondary | ICD-10-CM | POA: Diagnosis not present

## 2023-03-05 DIAGNOSIS — K219 Gastro-esophageal reflux disease without esophagitis: Secondary | ICD-10-CM

## 2023-03-05 DIAGNOSIS — Z23 Encounter for immunization: Secondary | ICD-10-CM | POA: Diagnosis not present

## 2023-03-05 DIAGNOSIS — Z7985 Long-term (current) use of injectable non-insulin antidiabetic drugs: Secondary | ICD-10-CM

## 2023-03-05 DIAGNOSIS — I10 Essential (primary) hypertension: Secondary | ICD-10-CM | POA: Diagnosis not present

## 2023-03-05 DIAGNOSIS — Z794 Long term (current) use of insulin: Secondary | ICD-10-CM | POA: Diagnosis not present

## 2023-03-05 LAB — GLUCOSE, POCT (MANUAL RESULT ENTRY): POC Glucose: 78 mg/dL (ref 70–99)

## 2023-03-05 MED ORDER — BLOOD PRESSURE KIT DEVI: 0 refills | Status: AC

## 2023-03-05 MED ORDER — AMLODIPINE BESYLATE 10 MG PO TABS
10.0000 mg | ORAL_TABLET | Freq: Every day | ORAL | 3 refills | Status: DC
Start: 2023-03-05 — End: 2023-07-09

## 2023-03-05 MED ORDER — INSULIN SYRINGE-NEEDLE U-100 31G X 5/16" 1 ML MISC: 1.0000 | Freq: Two times a day (BID) | 3 refills | Status: AC

## 2023-03-05 MED ORDER — FLUTICASONE PROPIONATE 50 MCG/ACT NA SUSP: 2.0000 | Freq: Every day | NASAL | 6 refills | Status: DC

## 2023-03-05 MED ORDER — ATORVASTATIN CALCIUM 40 MG PO TABS: 40.0000 mg | ORAL_TABLET | Freq: Every day | ORAL | 3 refills | Status: DC

## 2023-03-05 MED ORDER — SPIRONOLACTONE 50 MG PO TABS: 50.0000 mg | ORAL_TABLET | Freq: Every day | ORAL | 2 refills | Status: DC

## 2023-03-05 MED ORDER — TRULICITY 1.5 MG/0.5ML ~~LOC~~ SOAJ: 1.5000 mg | SUBCUTANEOUS | 3 refills | Status: DC

## 2023-03-05 MED ORDER — INDOMETHACIN 50 MG PO CAPS: 50.0000 mg | ORAL_CAPSULE | Freq: Three times a day (TID) | ORAL | 0 refills | Status: DC

## 2023-03-05 MED ORDER — OMEPRAZOLE 20 MG PO CPDR: 20.0000 mg | DELAYED_RELEASE_CAPSULE | Freq: Every day | ORAL | 3 refills | Status: DC

## 2023-03-05 MED ORDER — CARVEDILOL 25 MG PO TABS
25.0000 mg | ORAL_TABLET | Freq: Two times a day (BID) | ORAL | 3 refills | Status: DC
Start: 2023-03-05 — End: 2023-10-23

## 2023-03-05 MED ORDER — HYDRALAZINE HCL 100 MG PO TABS: 100.0000 mg | ORAL_TABLET | Freq: Three times a day (TID) | ORAL | 3 refills | Status: DC

## 2023-03-05 MED ORDER — INSULIN GLARGINE 100 UNIT/ML ~~LOC~~ SOLN: 40.0000 [IU] | Freq: Two times a day (BID) | SUBCUTANEOUS | 3 refills | Status: DC

## 2023-03-05 MED ORDER — CHLORTHALIDONE 25 MG PO TABS: ORAL_TABLET | ORAL | 2 refills | Status: DC

## 2023-03-05 MED ORDER — METFORMIN HCL 1000 MG PO TABS: 1000.0000 mg | ORAL_TABLET | Freq: Two times a day (BID) | ORAL | 3 refills | Status: DC

## 2023-03-05 MED ORDER — DAPAGLIFLOZIN PROPANEDIOL 10 MG PO TABS
10.0000 mg | ORAL_TABLET | Freq: Every day | ORAL | 3 refills | Status: DC
Start: 2023-03-05 — End: 2023-07-08

## 2023-03-05 NOTE — Progress Notes (Signed)
Patient ID: Justin Houston, male   DOB: 04-15-1966, 57 y.o.   MRN: [id number]   Jaxan Kitchens, is a 57 y.o. male  [id number]  [id number]  DOB - 01-May-1965  Chief Complaint  Patient presents with   Diabetes       Subjective:   Justin Houston is a 57 y.o. male here today for a follow up visit after being seen recently on the mobile unit.  We went over his medication list twice and I compared it to the last visit with Dr Delford Field (05/2022)and confirmed all of the meds he has been taking.  He denies any issues or concerns.  He is compliant with meds.  Denies CP/SOB/HA/Dizziness.  He would like to get flu shot today.  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    He has increased his water intake to reduce probability of gout flare.  No pain currently.     He would like to get a BP cuff.   No problems updated.  ALLERGIES: Allergies  Allergen Reactions   Losartan Potassium-Hctz Hives   Cozaar [Losartan Potassium] Hives    Hives     PAST MEDICAL HISTORY: Past Medical History:  Diagnosis Date   Dermatitis    Diabetes mellitus    GERD (gastroesophageal reflux disease)    Gout    Hyperlipidemia    Hypertension    Lipoma of scalp    Mass of head, right postauricular SQ 7x4cm 09/29/2013   Obesity    Toenail fungus 08/31/2018    MEDICATIONS AT HOME: Prior to Admission medications   Medication Sig Start Date End Date Taking? Authorizing Provider  Blood Glucose Monitoring Suppl (ONETOUCH VERIO) w/Device KIT Use as instructed to check blood sugar 3 times daily. E11.319 Z79.4 11/03/18  Yes Storm Frisk, MD  cetirizine (ZYRTEC ALLERGY) 10 MG tablet Take 1 tablet (10 mg total) by mouth daily. 01/16/22  Yes Mayers, Cari S, PA-C  clobetasol ointment (TEMOVATE) 0.05 % Apply 1 Application topically 3 (three) times daily as needed (rash.). 05/28/22  Yes Storm Frisk, MD  GLUCOSAMINE HCL PO Take 2,000 mg by mouth daily. With Vitamin D   Yes [provider]  glucose blood  (ONETOUCH VERIO) test strip USE AS INSTRUCTED TO CHECK BLOOD SUGAR 3 TIMES DAILY 01/01/21  Yes Storm Frisk, MD  latanoprost (XALATAN) 0.005 % ophthalmic solution Place 1 drop into both eyes at bedtime. 10/01/21  Yes [provider]  meloxicam (MOBIC) 15 MG tablet TAKE 1 TABLET BY MOUTH EVERY DAY 10/31/22  Yes Storm Frisk, MD  OneTouch Delica Lancets 33G MISC Use as instructed to check blood sugar 3 times daily. E95.284 Z79.4 01/01/21  Yes Storm Frisk, MD  STIMULANT LAXATIVE 8.6-50 MG tablet TAKE 1 TABLET BY MOUTH DAILY 05/21/19  Yes Storm Frisk, MD  amLODipine (NORVASC) 10 MG tablet Take 1 tablet (10 mg total) by mouth daily. 03/05/23   Anders Simmonds, PA-C  atorvastatin (LIPITOR) 40 MG tablet Take 1 tablet (40 mg total) by mouth daily. 03/05/23   Anders Simmonds, PA-C  Blood Pressure Monitoring (BLOOD PRESSURE KIT) DEVI Use to measure blood pressure 03/05/23   Georgian Co M, PA-C  carvedilol (COREG) 25 MG tablet Take 1 tablet (25 mg total) by mouth 2 (two) times daily with a meal. 03/05/23   Briann Sarchet, Marzella Schlein, PA-C  chlorthalidone (HYGROTON) 25 MG tablet TAKE 1 TABLET BY MOUTH DAILY. 03/05/23   Anders Simmonds, PA-C  dapagliflozin propanediol (FARXIGA) 10 MG TABS  tablet Take 1 tablet (10 mg total) by mouth daily. 03/05/23   Anders Simmonds, PA-C  Dulaglutide (TRULICITY) 1.5 MG/0.5ML SOAJ Inject 1.5 mg into the skin once a week. 03/05/23   Anders Simmonds, PA-C  fluticasone (FLONASE) 50 MCG/ACT nasal spray Place 2 sprays into both nostrils daily. 03/05/23   Anders Simmonds, PA-C  hydrALAZINE (APRESOLINE) 100 MG tablet Take 1 tablet (100 mg total) by mouth 3 (three) times daily. 03/05/23   Anders Simmonds, PA-C  indomethacin (INDOCIN) 50 MG capsule Take 1 capsule (50 mg total) by mouth 3 (three) times daily with meals. Cont for 2-3 days after pain resolves 03/05/23   Georgian Co M, PA-C  insulin glargine (LANTUS) 100 UNIT/ML injection Inject 0.4 mLs (40  Units total) into the skin 2 (two) times daily. 03/05/23   Anders Simmonds, PA-C  Insulin Syringe-Needle U-100 (BD INSULIN SYRINGE U/F) 31G X 5/16" 1 ML MISC Inject 1 each into the skin 2 (two) times daily. 03/05/23   Anders Simmonds, PA-C  metFORMIN (GLUCOPHAGE) 1000 MG tablet Take 1 tablet (1,000 mg total) by mouth 2 (two) times daily with a meal. 03/05/23   Erika Slaby, Marzella Schlein, PA-C  omeprazole (PRILOSEC) 20 MG capsule Take 1 capsule (20 mg total) by mouth daily. 03/05/23   Anders Simmonds, PA-C  spironolactone (ALDACTONE) 50 MG tablet Take 1 tablet (50 mg total) by mouth daily. 03/05/23   Casidy Alberta, Marzella Schlein, PA-C    ROS: Neg HEENT Neg resp Neg cardiac Neg GI Neg GU Neg MS Neg psych Neg neuro  Objective:   Vitals:   03/05/23 1621 03/05/23 1652  BP: (!) 148/90 130/70  Pulse: 68   SpO2: 97%   Weight: (!) 313 lb (142 kg)    Exam General appearance : Awake, alert, not in any distress. Speech Clear. Not toxic looking HEENT: Atraumatic and Normocephalic Neck: Supple, no JVD. No cervical lymphadenopathy.  Chest: Good air entry bilaterally, CTAB.  No rales/rhonchi/wheezing CVS: S1 S2 regular, no murmurs. Extremities: B/L Lower Ext shows no edema, both legs are warm to touch Neurology: Awake alert, and oriented X 3, CN II-XII intact, Non focal Skin: No Rash  Data Review Lab Results  Component Value Date   HGBA1C 7.0 (H) 02/18/2023   HGBA1C 6.5 03/19/2022   HGBA1C 7.9 (A) 11/19/2021    Assessment & Plan   1. Type 2 diabetes mellitus with hyperglycemia, with long-term current use of insulin (HCC) Recent labs/CMP/CBC/PSA/urica acid reviewed - Glucose (CBG) - amLODipine (NORVASC) 10 MG tablet; Take 1 tablet (10 mg total) by mouth daily.  Dispense: 30 tablet; Refill: 3 - dapagliflozin propanediol (FARXIGA) 10 MG TABS tablet; Take 1 tablet (10 mg total) by mouth daily.  Dispense: 30 tablet; Refill: 3 - Dulaglutide (TRULICITY) 1.5 MG/0.5ML SOAJ; Inject 1.5 mg into the skin  once a week.  Dispense: 2 mL; Refill: 3 - metFORMIN (GLUCOPHAGE) 1000 MG tablet; Take 1 tablet (1,000 mg total) by mouth 2 (two) times daily with a meal.  Dispense: 60 tablet; Refill: 3 - insulin glargine (LANTUS) 100 UNIT/ML injection; Inject 0.4 mLs (40 Units total) into the skin 2 (two) times daily.  Dispense: 20 mL; Refill: 3 - Insulin Syringe-Needle U-100 (BD INSULIN SYRINGE U/F) 31G X 5/16" 1 ML MISC; Inject 1 each into the skin 2 (two) times daily.  Dispense: 100 each; Refill: 3  2. Essential hypertension At goal.  Confirmed med list from 05/2202 visit with Dr Delford Field - amLODipine (NORVASC) 10 MG tablet;  Take 1 tablet (10 mg total) by mouth daily.  Dispense: 30 tablet; Refill: 3 - carvedilol (COREG) 25 MG tablet; Take 1 tablet (25 mg total) by mouth 2 (two) times daily with a meal.  Dispense: 60 tablet; Refill: 3 - hydrALAZINE (APRESOLINE) 100 MG tablet; Take 1 tablet (100 mg total) by mouth 3 (three) times daily.  Dispense: 90 tablet; Refill: 3 - Blood Pressure Monitoring (BLOOD PRESSURE KIT) DEVI; Use to measure blood pressure  Dispense: 1 each; Refill: 0 - spironolactone (ALDACTONE) 50 MG tablet; Take 1 tablet (50 mg total) by mouth daily.  Dispense: 90 tablet; Refill: 2 - chlorthalidone (HYGROTON) 25 MG tablet; TAKE 1 TABLET BY MOUTH DAILY.  Dispense: 90 tablet; Refill: 2  3. Gouty arthritis of left great toe Resolved-he can have this to keep on hand if needed - indomethacin (INDOCIN) 50 MG capsule; Take 1 capsule (50 mg total) by mouth 3 (three) times daily with meals. Cont for 2-3 days after pain resolves  Dispense: 30 capsule; Refill: 0  4. Hyperlipidemia, unspecified hyperlipidemia type - atorvastatin (LIPITOR) 40 MG tablet; Take 1 tablet (40 mg total) by mouth daily.  Dispense: 90 tablet; Refill: 3  5. Gastroesophageal reflux disease without esophagitis - omeprazole (PRILOSEC) 20 MG capsule; Take 1 capsule (20 mg total) by mouth daily.  Dispense: 30 capsule; Refill: 3  6. Long  term (current) use of insulin (HCC) - insulin glargine (LANTUS) 100 UNIT/ML injection; Inject 0.4 mLs (40 Units total) into the skin 2 (two) times daily.  Dispense: 20 mL; Refill: 3 - Insulin Syringe-Needle U-100 (BD INSULIN SYRINGE U/F) 31G X 5/16" 1 ML MISC; Inject 1 each into the skin 2 (two) times daily.  Dispense: 100 each; Refill: 3  7. Long-term current use of injectable noninsulin antidiabetic medication - Dulaglutide (TRULICITY) 1.5 MG/0.5ML SOAJ; Inject 1.5 mg into the skin once a week.  Dispense: 2 mL; Refill: 3  8. Needs flu shot Flu shot given    Return in about 3 months (around 06/05/2023) for PCP for chronic conditions-please assign him a new PCP.  The patient was given clear instructions to go to ER or return to medical center if symptoms don't improve, worsen or new problems develop. The patient verbalized understanding. The patient was told to call to get lab results if they haven't heard anything in the next week.      Georgian Co, PA-C [facility] [place], [place] [phone]   03/05/2023, 4:55 PM

## 2023-03-05 NOTE — Patient Instructions (Signed)
Work at a goal of eliminating sugary drinks, candy, desserts, sweets, refined sugars, processed foods, and white carbohydrates.     Please drink 64 to 80 ounces water daily  Sit still and quiet for 5 mins with deep breathing before checking your blood pressures daily

## 2023-04-02 ENCOUNTER — Encounter: Payer: Self-pay | Admitting: Podiatry

## 2023-04-02 ENCOUNTER — Ambulatory Visit (INDEPENDENT_AMBULATORY_CARE_PROVIDER_SITE_OTHER): Payer: BC Managed Care – PPO | Admitting: Podiatry

## 2023-04-02 DIAGNOSIS — L84 Corns and callosities: Secondary | ICD-10-CM

## 2023-04-02 DIAGNOSIS — E1151 Type 2 diabetes mellitus with diabetic peripheral angiopathy without gangrene: Secondary | ICD-10-CM

## 2023-04-02 DIAGNOSIS — E1169 Type 2 diabetes mellitus with other specified complication: Secondary | ICD-10-CM

## 2023-04-02 DIAGNOSIS — B351 Tinea unguium: Secondary | ICD-10-CM | POA: Diagnosis not present

## 2023-04-02 NOTE — Progress Notes (Signed)
This patient returns to my office for at risk foot care.  This patient requires this care by a professional since this patient will be at risk due to having diabetes.  This patient is unable to cut nails himself since the patient cannot reach his nails.These nails are painful walking and wearing shoes.  He also has callus on the outside of his left foot.This patient presents for at risk foot care today.  General Appearance  Alert, conversant and in no acute stress.  Vascular  Dorsalis pedis and posterior tibial  pulses are palpable  bilaterally.  Capillary return is within normal limits  bilaterally. Temperature is within normal limits  bilaterally.  Neurologic  Senn-Weinstein monofilament wire test within normal limits  bilaterally. Muscle power within normal limits bilaterally.  Nails Thick disfigured discolored nails with subungual debris  from hallux to fifth toes bilaterally. No evidence of bacterial infection or drainage bilaterally.  Orthopedic  No limitations of motion  feet .  No crepitus or effusions noted.  No bony pathology or digital deformities noted.  Skin  normotropic skin with no porokeratosis noted bilaterally.  No signs of infections or ulcers noted.   Callus lateral aspect fifth metabase left foot.  Onychomycosis  Pain in right toes  Pain in left toes  Callus left foot.  Consent was obtained for treatment procedures.   Mechanical debridement of nails 1-5  bilaterally performed with a nail nipper.  Filed with dremel without incident. Debride callus with # 15 blade and dremel tool.   Return office visit     3  months                 Told patient to return for periodic foot care and evaluation due to potential at risk complications.   Sahej Hauswirth DPM   

## 2023-04-19 ENCOUNTER — Other Ambulatory Visit: Payer: Self-pay | Admitting: Physician Assistant

## 2023-04-19 DIAGNOSIS — M109 Gout, unspecified: Secondary | ICD-10-CM

## 2023-05-15 ENCOUNTER — Telehealth: Payer: Self-pay

## 2023-05-15 NOTE — Telephone Encounter (Signed)
Pt has been scheduled an appointment for paperwork completion      Copied from CRM [id number]. Topic: General - Other >> May 15, 2023 11:06 AM Patsy Lager T wrote: Reason for CRM: patient called stated he has a Health Examination Certificate that needs to be completed by tomorrow. Please f/u with patient

## 2023-05-19 ENCOUNTER — Ambulatory Visit: Payer: 59 | Attending: Internal Medicine | Admitting: Internal Medicine

## 2023-05-19 DIAGNOSIS — E785 Hyperlipidemia, unspecified: Secondary | ICD-10-CM

## 2023-05-19 DIAGNOSIS — E1159 Type 2 diabetes mellitus with other circulatory complications: Secondary | ICD-10-CM | POA: Diagnosis not present

## 2023-05-19 DIAGNOSIS — E669 Obesity, unspecified: Secondary | ICD-10-CM

## 2023-05-19 DIAGNOSIS — Z111 Encounter for screening for respiratory tuberculosis: Secondary | ICD-10-CM

## 2023-05-19 DIAGNOSIS — E66813 Obesity, class 3: Secondary | ICD-10-CM

## 2023-05-19 DIAGNOSIS — E1169 Type 2 diabetes mellitus with other specified complication: Secondary | ICD-10-CM

## 2023-05-19 DIAGNOSIS — Z794 Long term (current) use of insulin: Secondary | ICD-10-CM

## 2023-05-19 DIAGNOSIS — Z7985 Long-term (current) use of injectable non-insulin antidiabetic drugs: Secondary | ICD-10-CM

## 2023-05-19 DIAGNOSIS — Z1159 Encounter for screening for other viral diseases: Secondary | ICD-10-CM

## 2023-05-19 DIAGNOSIS — I152 Hypertension secondary to endocrine disorders: Secondary | ICD-10-CM

## 2023-05-19 DIAGNOSIS — Z0289 Encounter for other administrative examinations: Secondary | ICD-10-CM

## 2023-05-19 DIAGNOSIS — Z7984 Long term (current) use of oral hypoglycemic drugs: Secondary | ICD-10-CM

## 2023-05-19 DIAGNOSIS — E119 Type 2 diabetes mellitus without complications: Secondary | ICD-10-CM

## 2023-05-19 DIAGNOSIS — Z6841 Body Mass Index (BMI) 40.0 and over, adult: Secondary | ICD-10-CM

## 2023-05-19 LAB — POCT GLYCOSYLATED HEMOGLOBIN (HGB A1C): HbA1c, POC (controlled diabetic range): 6.5 % (ref 0.0–7.0)

## 2023-05-19 LAB — GLUCOSE, POCT (MANUAL RESULT ENTRY): POC Glucose: 216 mg/dL — AB (ref 70–99)

## 2023-05-19 MED ORDER — ONETOUCH VERIO VI STRP: ORAL_STRIP | 11 refills | Status: AC

## 2023-05-19 MED ORDER — ONETOUCH DELICA LANCETS 33G MISC: 11 refills | Status: AC

## 2023-05-19 MED ORDER — ONETOUCH VERIO W/DEVICE KIT: PACK | 0 refills | Status: AC

## 2023-05-19 NOTE — Progress Notes (Signed)
Patient ID: Justin Houston, male    DOB: 1965-10-18  MRN: [id number]  CC: Form completion for work physical and chronic ds management  Subjective: Justin Houston is a 58 y.o. male who presents for chronic ds management.  Previous PCP is Dr. Delford Field who has since retired. 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  Needs Health Exam Certificate form completed.  Needs TB skin test.  No chronic cough at this time.  He is up-to-date with Tdap vaccine.  He thinks he is up-to-date with MMR.  Thinks he may have received hepatitis B vaccine series in the past but does not have documentation.  He is agreeable to having levels checked today to make sure he is not adequately immunized against hepatitis B.  Job requires some lifting but patient states this is not a problem for him.  DM: Results for orders placed or performed in visit on 05/19/23  POCT glycosylated hemoglobin (Hb A1C)   Collection Time: 05/19/23  9:27 AM  Result Value Ref Range   Hemoglobin A1C     HbA1c POC (<> result, manual entry)     HbA1c, POC (prediabetic range)     HbA1c, POC (controlled diabetic range) 6.5 0.0 - 7.0 %  POCT glucose (manual entry)   Collection Time: 05/19/23 11:31 AM  Result Value Ref Range   POC Glucose 216 (A) 70 - 99 mg/dl  Should be on Trulicity 1.5 mg once a week, Farxiga 10 mg daily, Lantus 40 units twice/day and metformin 1 g twice a day. Reports compliance with medications. -glucometer no longer works -does not eat much red meat and cheese as these tend to cause flare of gout. Drinks water, diet drinks and coffee.  Weight is up 5 pounds since November. -goes up and down steps at work.  Has membership at [facility] but does not go regularly -Eye exam scheduled for later this month.  He wears prescription glasses.  HTN: Should be on spironolactone 50 mg daily, Norvasc 10 mg and chlorthalidone 25 mg  daily, carvedilol 25 mg twice a day, hydralazine 100 mg 3 times a day. -took meds already for this a.m No device No CP/SOB/LE edema  HL: Taking and tolerating atorvastatin 40 mg daily. Last LDL was 111  HM:  eye exam scheduled for 05/27/23 Patient Active Problem List   Diagnosis Date Noted   History of colonic polyps    Benign neoplasm of transverse colon    Benign neoplasm of rectum    Left hip pain 10/03/2020   Varicose veins of both legs with edema 08/31/2018   GERD (gastroesophageal reflux disease) 08/16/2016   Diabetic retinopathy (HCC) 12/08/2014   Umbilical hernia without obstruction and without gangrene 09/29/2013   Glaucoma 05/26/2013   Gout 08/01/2012   BMI 45.0-49.9, adult (HCC) 08/01/2012   Hypertension 07/20/2011   Diabetes mellitus type 2 with retinopathy (HCC) 07/20/2011   Hyperlipidemia associated with type 2 diabetes mellitus (HCC) 07/20/2011     Current Outpatient Medications on File Prior to Visit  Medication Sig Dispense Refill   amLODipine (NORVASC) 10 MG tablet Take 1 tablet (10 mg total) by mouth daily. 30 tablet 3   atorvastatin (LIPITOR) 40 MG tablet Take 1 tablet (40 mg total) by mouth daily. 90 tablet 3   Blood Pressure Monitoring (BLOOD PRESSURE KIT) DEVI Use to measure blood pressure 1 each 0   carvedilol (COREG) 25  MG tablet Take 1 tablet (25 mg total) by mouth 2 (two) times daily with a meal. 60 tablet 3   cetirizine (ZYRTEC ALLERGY) 10 MG tablet Take 1 tablet (10 mg total) by mouth daily. 30 tablet 11   chlorthalidone (HYGROTON) 25 MG tablet TAKE 1 TABLET BY MOUTH DAILY. 90 tablet 2   clobetasol ointment (TEMOVATE) 0.05 % Apply 1 Application topically 3 (three) times daily as needed (rash.). 60 g 1   dapagliflozin propanediol (FARXIGA) 10 MG TABS tablet Take 1 tablet (10 mg total) by mouth daily. 30 tablet 3   Dulaglutide (TRULICITY) 1.5 MG/0.5ML SOAJ Inject 1.5 mg into the skin once a week. 2 mL 3   fluticasone (FLONASE) 50 MCG/ACT nasal spray  Place 2 sprays into both nostrils daily. 16 g 6   GLUCOSAMINE HCL PO Take 2,000 mg by mouth daily. With Vitamin D     hydrALAZINE (APRESOLINE) 100 MG tablet Take 1 tablet (100 mg total) by mouth 3 (three) times daily. 90 tablet 3   indomethacin (INDOCIN) 50 MG capsule TAKE 1 CAPSULE BY MOUTH 3 TIMES DAILY WITH MEALS. continue FOR 2-3 DAYS AFTER PAIN resolves 30 capsule 0   insulin glargine (LANTUS) 100 UNIT/ML injection Inject 0.4 mLs (40 Units total) into the skin 2 (two) times daily. 20 mL 3   Insulin Syringe-Needle U-100 (BD INSULIN SYRINGE U/F) 31G X 5/16" 1 ML MISC Inject 1 each into the skin 2 (two) times daily. 100 each 3   latanoprost (XALATAN) 0.005 % ophthalmic solution Place 1 drop into both eyes at bedtime.     metFORMIN (GLUCOPHAGE) 1000 MG tablet Take 1 tablet (1,000 mg total) by mouth 2 (two) times daily with a meal. 60 tablet 3   omeprazole (PRILOSEC) 20 MG capsule Take 1 capsule (20 mg total) by mouth daily. 30 capsule 3   spironolactone (ALDACTONE) 50 MG tablet Take 1 tablet (50 mg total) by mouth daily. 90 tablet 2   STIMULANT LAXATIVE 8.6-50 MG tablet TAKE 1 TABLET BY MOUTH DAILY 30 tablet 1   No current facility-administered medications on file prior to visit.    Allergies  Allergen Reactions   Losartan Potassium-Hctz Hives   Cozaar [Losartan Potassium] Hives    Hives     Social History   Socioeconomic History   Marital status: Single    Spouse name: n/a   Number of children: 0   Years of education: 18   Highest education level: Not on file  Occupational History   Occupation: EC Lobbyist: [employer]  Tobacco Use   Smoking status: Former    Current packs/day: 0.00    Types: Cigarettes    Quit date: 09/05/1994    Years since quitting: 28.7   Smokeless tobacco: Never  Vaping Use   Vaping status: Never Used  Substance and Sexual Activity   Alcohol use: No    Alcohol/week: 0.0 standard drinks of alcohol   Drug use: No    Sexual activity: Never  Other Topics Concern   Not on file  Social History Narrative   Master's Degree in Adult Education.  Lives alone.   No siblings.   Social Drivers of Corporate investment banker Strain: Not on file  Food Insecurity: Not on file  Transportation Needs: Not on file  Physical Activity: Not on file  Stress: Not on file  Social Connections: Not on file  Intimate Partner Violence: Not on file    Family History  Problem  Relation Age of Onset   Stroke Mother    Hypertension Mother    Diabetes Maternal Grandmother    Prostate cancer Maternal Grandfather    Diabetes Other     Past Surgical History:  Procedure Laterality Date   COLONOSCOPY WITH PROPOFOL N/A 12/01/2014   Procedure: COLONOSCOPY WITH PROPOFOL;  Surgeon: Rachael Fee, MD;  Location: [facility];  Service: Endoscopy;  Laterality: N/A;   COLONOSCOPY WITH PROPOFOL N/A 12/20/2021   Procedure: COLONOSCOPY WITH PROPOFOL;  Surgeon: Meryl Dare, MD;  Location: [facility];  Service: Gastroenterology;  Laterality: N/A;   LIPOMA EXCISION N/A 01/28/2020   Procedure: EXCISION RIGHT SCALP LIPOMA;  Surgeon: Allena Napoleon, MD;  Location: [facility];  Service: Plastics;  Laterality: N/A;   POLYPECTOMY  12/20/2021   Procedure: POLYPECTOMY;  Surgeon: Meryl Dare, MD;  Location: [facility];  Service: Gastroenterology;;   right arm ORIF      ROS: Review of Systems Negative except as stated above  PHYSICAL EXAM: BP 133/77   Pulse 70   Temp 98.1 F (36.7 C) (Oral)   Ht 5\' 11"  (1.803 m)   Wt (!) 318 lb (144.2 kg)   SpO2 99%   BMI 44.35 kg/m   Wt Readings from Last 3 Encounters:  05/19/23 (!) 318 lb (144.2 kg)  03/05/23 (!) 313 lb (142 kg)  02/18/23 (!) 307 lb (139.3 kg)    Physical Exam  General appearance - alert, well appearing, and in no distress Mental status - normal mood, behavior, speech, dress, motor activity, and thought processes Mouth - mucous membranes moist,  pharynx normal without lesions Neck - supple, no significant adenopathy Chest - clear to auscultation, no wheezes, rales or rhonchi, symmetric air entry Heart - normal rate, regular rhythm, normal S1, S2, no murmurs, rubs, clicks or gallops Extremities - no LE edema, moderate varicose veins LE LT>RT Diabetic Foot Exam - Simple   Simple Foot Form Diabetic Foot exam was performed with the following findings: Yes 05/19/2023  9:56 AM  Visual Inspection See comments: Yes Sensation Testing Intact to touch and monofilament testing bilaterally: Yes Pulse Check Posterior Tibialis and Dorsalis pulse intact bilaterally: Yes Comments Toenails discolored on 1st/2nd toes BL. Bunion RT big toe. Callus on plantar 2nd RT toe. Bony deformity mid foot laterally BL, 3-4 cm callus on this area LT foot.         Latest Ref Rng & Units 02/18/2023    5:01 PM 03/19/2022    4:03 PM 11/19/2021   11:31 AM  CMP  Glucose 70 - 99 mg/dL 440  86  102   BUN 6 - 24 mg/dL 24  24  22    Creatinine 0.76 - 1.27 mg/dL 7.25  3.66  4.40   Sodium 134 - 144 mmol/L 140  141  141   Potassium 3.5 - 5.2 mmol/L 4.2  4.2  4.1   Chloride 96 - 106 mmol/L 101  100  101   CO2 20 - 29 mmol/L  24  21   Calcium 8.7 - 10.2 mg/dL 9.4  9.9  9.8   Total Protein 6.0 - 8.5 g/dL 7.2   7.3   Total Bilirubin 0.0 - 1.2 mg/dL 0.8   1.0   Alkaline Phos 44 - 121 IU/L 114   109   AST 0 - 40 IU/L 16   20   ALT 0 - 44 IU/L   16    Lipid Panel     Component Value  Date/Time   CHOL 169 03/19/2022 1603   TRIG 89 03/19/2022 1603   HDL 41 03/19/2022 1603   CHOLHDL 4.1 03/19/2022 1603   CHOLHDL 3.8 01/16/2016 1619   VLDL 23 01/16/2016 1619   LDLCALC 111 (H) 03/19/2022 1603    CBC    Component Value Date/Time   WBC 7.6 02/18/2023 1701   WBC 6.0 01/16/2016 1619   RBC 4.85 02/18/2023 1701   RBC 4.97 01/16/2016 1619   HGB 14.7 02/18/2023 1701   HCT 44.2 02/18/2023 1701   PLT 212 02/18/2023 1701   MCV 91 02/18/2023 1701   MCH 30.3 02/18/2023  1701   MCH 30.2 01/16/2016 1619   MCHC 33.3 02/18/2023 1701   MCHC 34.4 01/16/2016 1619   RDW 12.9 02/18/2023 1701   LYMPHSABS 1.8 02/18/2023 1701   MONOABS 420 01/16/2016 1619   EOSABS 0.1 02/18/2023 1701   BASOSABS 0.0 02/18/2023 1701    ASSESSMENT AND PLAN: 1. Type 2 diabetes mellitus with morbid obesity (HCC) (Primary) At goal.  Continue Trulicity 1.5 mg once a week, metformin 1 g twice a day, Farxiga 10 mg daily and Lantus insulin 40 units twice a day. Encouraged him to continue trying to eat healthy. Encouraged him to try getting some exercise outside of work like 30 minutes of moderate intensity exercise 3 to 5 days a week for 30 minutes. - POCT glycosylated hemoglobin (Hb A1C) - POCT glucose (manual entry) - Microalbumin / creatinine urine ratio - Blood Glucose Monitoring Suppl (ONETOUCH VERIO) w/Device KIT; Use as instructed to check blood sugar 3 times daily. E11.319 Z79.4  Dispense: 1 kit; Refill: 0 - glucose blood (ONETOUCH VERIO) test strip; USE AS INSTRUCTED TO CHECK BLOOD SUGAR 3 TIMES DAILY  Dispense: 100 strip; Refill: 11 - OneTouch Delica Lancets 33G MISC; Use as instructed to check blood sugar 3 times daily. E11.319 Z79.4  Dispense: 100 each; Refill: 11  2. Insulin long-term use (HCC) 3. Long-term (current) use of injectable non-insulin antidiabetic drugs See #1 above.  4. Diabetes mellitus treated with oral medication (HCC) See #1 above.  5. Hypertension associated with diabetes (HCC) Repeat blood pressure closer to goal.  Continue spironolactone 50 mg daily, Norvasc 10 mg and chlorthalidone 25 mg daily, carvedilol 25 mg twice a day, hydralazine 100 mg 3 times a day. - For home use only DME Other see comment  6. Hyperlipidemia associated with type 2 diabetes mellitus (HCC) Continue atorvastatin 40 mg daily - Lipid panel  7. Encounter for physical examination related to employment PPD will be placed today.  He will return in 48 to 72 hours to have it  read.  8. Screening examination for pulmonary tuberculosis See #7 above.  9. Need for hepatitis B screening test We will check to see if he is adequately immunized.  If not we will either give the vaccine series or a booster depending on his level. - Hepatitis B surface antibody,quantitative; Future - Hepatitis B surface antigen; Future      Patient was given the opportunity to ask questions.  Patient verbalized understanding of the plan and was able to repeat key elements of the plan.   This documentation was completed using Paediatric nurse.  Any transcriptional errors are unintentional.  Orders Placed This Encounter  Procedures   For home use only DME Other see comment   Microalbumin / creatinine urine ratio   Hepatitis B surface antibody,quantitative   Hepatitis B surface antigen   Lipid panel   POCT glycosylated hemoglobin (  Hb A1C)   POCT glucose (manual entry)     Requested Prescriptions   Signed Prescriptions Disp Refills   Blood Glucose Monitoring Suppl (ONETOUCH VERIO) w/Device KIT 1 kit 0    Sig: Use as instructed to check blood sugar 3 times daily. E11.319 Z79.4   glucose blood (ONETOUCH VERIO) test strip 100 strip 11    Sig: USE AS INSTRUCTED TO CHECK BLOOD SUGAR 3 TIMES DAILY   OneTouch Delica Lancets 33G MISC 100 each 11    Sig: Use as instructed to check blood sugar 3 times daily. E11.319 Z79.4    Return in about 4 months (around 09/16/2023) for Give RN visit on Thur morning to have PPD skin test read.Jonah Blue, MD, FACP

## 2023-05-20 LAB — LIPID PANEL
Chol/HDL Ratio: 5 {ratio} (ref 0.0–5.0)
Cholesterol, Total: 191 mg/dL (ref 100–199)
HDL: 38 mg/dL — ABNORMAL LOW (ref >39)
LDL Chol Calc (NIH): 128 mg/dL — ABNORMAL HIGH (ref 0–99)
Triglycerides: 137 mg/dL (ref 0–149)
VLDL Cholesterol Cal: 25 mg/dL (ref 5–40)

## 2023-05-20 LAB — MICROALBUMIN / CREATININE URINE RATIO
Creatinine, Urine: 69.2 mg/dL
Microalb/Creat Ratio: 58 mg/g{creat} — ABNORMAL HIGH (ref 0–29)
Microalbumin, Urine: 40.4 ug/mL

## 2023-05-21 ENCOUNTER — Telehealth: Payer: Self-pay | Admitting: Internal Medicine

## 2023-05-21 ENCOUNTER — Encounter: Payer: Self-pay | Admitting: Internal Medicine

## 2023-05-21 NOTE — Telephone Encounter (Signed)
 Phone call placed to patient today to go over lab results and also to let him know that I accidentally entered the orders for the hepatitis B screening tests as future lab so they were not drawn with the rest of the labs done on recent visit.  Would like for him to stop at lab when he comes to see RN tomorrow to have PPD/tuberculin skin test read. LVMM for him stating who I am and that I will have the RN speak with I'm tomorrow when he comes to have PPD/tuberculin skin test read.

## 2023-05-22 ENCOUNTER — Ambulatory Visit: Payer: 59

## 2023-05-22 ENCOUNTER — Ambulatory Visit: Payer: 59 | Attending: Internal Medicine

## 2023-05-22 DIAGNOSIS — Z1159 Encounter for screening for other viral diseases: Secondary | ICD-10-CM | POA: Diagnosis not present

## 2023-05-22 LAB — TB SKIN TEST
Induration: 0 mm
TB Skin Test: NEGATIVE

## 2023-05-22 NOTE — Progress Notes (Signed)
PPD Reading Note  PPD read and results entered in EpicCare.  Result: 0 mm induration.  Interpretation: Negative  Allergic reaction: no

## 2023-05-23 ENCOUNTER — Other Ambulatory Visit: Payer: Self-pay | Admitting: Internal Medicine

## 2023-05-23 DIAGNOSIS — M109 Gout, unspecified: Secondary | ICD-10-CM

## 2023-05-23 LAB — HEPATITIS B SURFACE ANTIBODY, QUANTITATIVE: Hepatitis B Surf Ab Quant: <3.5 m[IU]/mL — ABNORMAL LOW

## 2023-05-23 LAB — HEPATITIS B SURFACE ANTIGEN: Hepatitis B Surface Ag: NEGATIVE

## 2023-05-23 NOTE — Progress Notes (Signed)
 Let patient know that lab test show that he is not vaccinated against hepatitis B.  If he would like to start the vaccine series which I would recommend, please schedule an appointment for him to see the RN I will look to start the series.  It involves first vaccine and then the second shot 1 month later than the third and final shot 6 months later.

## 2023-05-23 NOTE — Telephone Encounter (Signed)
 Requested medication (s) are due for refill today: na   Requested medication (s) are on the active medication list: yes   Last refill:  04/20/23 #30  0 refills   Future visit scheduled: yes in 3 months  Notes to clinic:  no refills remain. Do you want to continue refills?     Requested Prescriptions  Pending Prescriptions Disp Refills   indomethacin  (INDOCIN ) 50 MG capsule [Pharmacy Med Name: indomethacin  50 mg capsule] 30 capsule 0    Sig: TAKE 1 CAPSULE BY MOUTH 3 TIMES DAILY WITH MEALS - continue FOR 2-3 DAYS AFTER PAIN resolves     Analgesics:  NSAIDS Failed - 05/23/2023  1:09 PM      Failed - Manual Review: Labs are only required if the patient has taken medication for more than 8 weeks.      Failed - Cr in normal range and within 360 days    Creat  Date Value Ref Range Status  01/16/2016 0.95 0.70 - 1.33 mg/dL Final    Comment:      For patients > or = 59 years of age: The upper reference limit for Creatinine is approximately 13% higher for people identified as African-American.      Creatinine, Ser  Date Value Ref Range Status  02/18/2023 1.28 (H) 0.76 - 1.27 mg/dL Final   Creatinine, Urine  Date Value Ref Range Status  08/12/2010 201.3 mg/dL Final         Passed - HGB in normal range and within 360 days    Hemoglobin  Date Value Ref Range Status  02/18/2023 14.7 13.0 - 17.7 g/dL Final         Passed - PLT in normal range and within 360 days    Platelets  Date Value Ref Range Status  02/18/2023 212 150 - 450 x10E3/uL Final   Platelet Count, POC  Date Value Ref Range Status  01/24/2014 206 142 - 424 K/uL Final         Passed - HCT in normal range and within 360 days    Hematocrit  Date Value Ref Range Status  02/18/2023 44.2 37.5 - 51.0 % Final         Passed - eGFR is 30 or above and within 360 days    GFR calc Af Amer  Date Value Ref Range Status  11/09/2019 75 >59 mL/min/1.73 Final    Comment:    **[facility] currently reports eGFR in compliance  with the current**   recommendations of the [employer]. [facility] will   update reporting as new guidelines are published from the NKF-ASN   Task force.    GFR, Estimated  Date Value Ref Range Status  01/26/2020 >60 >60 mL/min Final   eGFR  Date Value Ref Range Status  02/18/2023 65 >59 mL/min/1.73 Final         Passed - Patient is not pregnant      Passed - Valid encounter within last 12 months    Recent Outpatient Visits           4 days ago Type 2 diabetes mellitus with morbid obesity (HCC)   [facility] [place]. [facility] Vicci Sober B, MD   2 months ago Type 2 diabetes mellitus with hyperglycemia, with long-term current use of insulin  [facility])   [facility] [place]. [facility], [place], PA-C   12 months  ago Primary hypertension   [facility] [place]. [facility] Brien Belvie BRAVO, MD   1 year ago Essential hypertension   [facility] [place]. [facility] Brien Belvie BRAVO, MD   1 year ago Type 2 diabetes mellitus with retinopathy, with long-term current use of insulin , macular edema presence unspecified, unspecified laterality, unspecified retinopathy severity (HCC)   [facility] [place]. [facility] Brien Belvie BRAVO, MD       Future Appointments             In 3 months Vicci Barnie NOVAK, MD [facility] [place]. [facility]

## 2023-05-26 ENCOUNTER — Encounter: Payer: Self-pay | Admitting: Podiatry

## 2023-05-26 ENCOUNTER — Ambulatory Visit (INDEPENDENT_AMBULATORY_CARE_PROVIDER_SITE_OTHER): Payer: 59 | Admitting: Podiatry

## 2023-05-26 DIAGNOSIS — M2140 Flat foot [pes planus] (acquired), unspecified foot: Secondary | ICD-10-CM

## 2023-05-26 DIAGNOSIS — L84 Corns and callosities: Secondary | ICD-10-CM

## 2023-05-26 DIAGNOSIS — E0843 Diabetes mellitus due to underlying condition with diabetic autonomic (poly)neuropathy: Secondary | ICD-10-CM

## 2023-05-26 NOTE — Progress Notes (Signed)
 Chief Complaint  Patient presents with   Diabetes    Patient states he is here for [facility] and Callouses trimed, it hurts when he walks     HPI: 58 y.o. male presenting today for evaluation of symptomatic calluses to the plantar aspect of the fifth MTP bilateral.  Patient is diabetic.  Presenting for further treatment and evaluation  Past Medical History:  Diagnosis Date   Dermatitis    Diabetes mellitus    GERD (gastroesophageal reflux disease)    Gout    Hyperlipidemia    Hypertension    Lipoma of scalp    Mass of head, right postauricular SQ 7x4cm 09/29/2013   Obesity    Toenail fungus 08/31/2018    Past Surgical History:  Procedure Laterality Date   COLONOSCOPY WITH PROPOFOL  N/A 12/01/2014   Procedure: COLONOSCOPY WITH PROPOFOL ;  Surgeon: Janel Medford, MD;  Location: [facility];  Service: Endoscopy;  Laterality: N/A;   COLONOSCOPY WITH PROPOFOL  N/A 12/20/2021   Procedure: COLONOSCOPY WITH PROPOFOL ;  Surgeon: Asencion Blacksmith, MD;  Location: [facility];  Service: Gastroenterology;  Laterality: N/A;   LIPOMA EXCISION N/A 01/28/2020   Procedure: EXCISION RIGHT SCALP LIPOMA;  Surgeon: Barb Bonito, MD;  Location: [facility];  Service: Plastics;  Laterality: N/A;   POLYPECTOMY  12/20/2021   Procedure: POLYPECTOMY;  Surgeon: Asencion Blacksmith, MD;  Location: [facility];  Service: Gastroenterology;;   right arm ORIF      Allergies  Allergen Reactions   Losartan Potassium-Hctz Hives   Cozaar [Losartan Potassium] Hives    Hives      Physical Exam: General: The patient is alert and oriented x3 in no acute distress.  Dermatology: Skin is warm, dry and supple bilateral lower extremities.  Preulcerative callus lesions noted to the fifth metatarsal tubercle bilateral.  Hammertoe contracture deformity also contributes to symptomatic callus to the distal tip of the right second toe.  No open wounds  Vascular: Palpable pedal pulses bilaterally. Capillary refill  within normal limits.  No appreciable edema.  No erythema.  Neurological: Grossly intact via light touch  Musculoskeletal Exam: Pes planovalgus deformity noted with 'c' shaped foot structure creating excessive pressure on the lateral column and fifth metatarsal tubercles specifically.  Hammertoe contracture deformity also noted to the lesser digits  Assessment/Plan of Care: 1.  Pes planovalgus deformity with excessive lateral column loading bilateral 2.  Preulcerative callus lesions plantar aspect of the fifth MTP bilateral 3.  Hammertoe deformity lesser digits bilateral 4.  Diabetes mellitus with peripheral polyneuropathy  -Patient evaluated -Excisional debridement of the hyperkeratotic calluses was performed today using a 312 scalpel without incident or bleeding. -The patient would benefit from diabetic shoes and custom molded Plastizote insoles.  This will help equally distribute pressure throughout the foot and prevent the callus lesions from becoming ulcers -Appointment with orthotics department for diabetic shoes and custom molded Plastizote insoles -Return to clinic next scheduled appointment for routine footcare       Dot Gazella, DPM [facility]  Dr. Dot Gazella, DPM    [address] [place], [place] [address]                Office [phone]  Fax [phone]

## 2023-05-27 LAB — HM DIABETES EYE EXAM

## 2023-06-02 ENCOUNTER — Encounter: Payer: Self-pay | Admitting: Internal Medicine

## 2023-06-10 ENCOUNTER — Ambulatory Visit: Payer: BC Managed Care – PPO | Admitting: Internal Medicine

## 2023-06-12 ENCOUNTER — Other Ambulatory Visit: Payer: Self-pay | Admitting: Critical Care Medicine

## 2023-06-12 DIAGNOSIS — I10 Essential (primary) hypertension: Secondary | ICD-10-CM

## 2023-06-20 ENCOUNTER — Other Ambulatory Visit: Payer: Self-pay | Admitting: Internal Medicine

## 2023-06-20 DIAGNOSIS — M109 Gout, unspecified: Secondary | ICD-10-CM

## 2023-06-26 ENCOUNTER — Ambulatory Visit: Payer: 59

## 2023-07-02 NOTE — Progress Notes (Signed)
 Patient presents to the office today for diabetic shoe and insole measuring.  Patient was measured with brannock device to determine size and width for 1 pair of extra depth shoes and foam casted for 3 pair of insoles.   Documentation of medical necessity will be sent to patient's treating diabetic doctor to verify and sign.   [employer] plan  Shoes and insoles will be ordered at that time and patient will be notified for an appointment for fitting when they arrive.   Shoe size (per patient): 15 Brannock measurement: 15 Shoe choice:   [id number] Shoe size ordered: 15WD  Ppw signed

## 2023-07-07 ENCOUNTER — Other Ambulatory Visit: Payer: Self-pay | Admitting: Physician Assistant

## 2023-07-07 DIAGNOSIS — K219 Gastro-esophageal reflux disease without esophagitis: Secondary | ICD-10-CM

## 2023-07-07 DIAGNOSIS — E1165 Type 2 diabetes mellitus with hyperglycemia: Secondary | ICD-10-CM

## 2023-07-08 ENCOUNTER — Other Ambulatory Visit: Payer: Self-pay | Admitting: Physician Assistant

## 2023-07-08 ENCOUNTER — Telehealth: Payer: Self-pay

## 2023-07-08 ENCOUNTER — Ambulatory Visit: Payer: BC Managed Care – PPO | Admitting: Podiatry

## 2023-07-08 DIAGNOSIS — E1165 Type 2 diabetes mellitus with hyperglycemia: Secondary | ICD-10-CM

## 2023-07-08 DIAGNOSIS — I10 Essential (primary) hypertension: Secondary | ICD-10-CM

## 2023-07-08 NOTE — Telephone Encounter (Signed)
 Left VM to schedule diabetic shoe pick up

## 2023-07-15 ENCOUNTER — Encounter: Payer: Self-pay | Admitting: Podiatry

## 2023-07-15 ENCOUNTER — Ambulatory Visit (INDEPENDENT_AMBULATORY_CARE_PROVIDER_SITE_OTHER): Admitting: Podiatry

## 2023-07-15 DIAGNOSIS — E1151 Type 2 diabetes mellitus with diabetic peripheral angiopathy without gangrene: Secondary | ICD-10-CM | POA: Diagnosis not present

## 2023-07-15 DIAGNOSIS — B351 Tinea unguium: Secondary | ICD-10-CM | POA: Diagnosis not present

## 2023-07-15 DIAGNOSIS — L84 Corns and callosities: Secondary | ICD-10-CM

## 2023-07-15 DIAGNOSIS — E1169 Type 2 diabetes mellitus with other specified complication: Secondary | ICD-10-CM

## 2023-07-15 NOTE — Progress Notes (Signed)
 This patient returns to my office for at risk foot care.  This patient requires this care by a professional since this patient will be at risk due to having diabetes.  This patient is unable to cut nails himself since the patient cannot reach his nails.These nails are painful walking and wearing shoes.  He also has callus on the outside of his left foot.This patient presents for at risk foot care today.  General Appearance  Alert, conversant and in no acute stress.  Vascular  Dorsalis pedis and posterior tibial  pulses are palpable  bilaterally.  Capillary return is within normal limits  bilaterally. Temperature is within normal limits  bilaterally.  Neurologic  Senn-Weinstein monofilament wire test within normal limits  bilaterally. Muscle power within normal limits bilaterally.  Nails Thick disfigured discolored nails with subungual debris  from hallux to fifth toes bilaterally. No evidence of bacterial infection or drainage bilaterally.  Orthopedic  No limitations of motion  feet .  No crepitus or effusions noted.  No bony pathology or digital deformities noted.  Skin  normotropic skin with no porokeratosis noted bilaterally.  No signs of infections or ulcers noted.   Callus lateral aspect fifth metabase left foot.  Onychomycosis  Pain in right toes  Pain in left toes  Callus left foot.  Consent was obtained for treatment procedures.   Mechanical debridement of nails 1-5  bilaterally performed with a nail nipper.  Filed with dremel without incident. Debride callus with # 15 blade and dremel tool. Dispense diabetic shoes.   Return office visit     3  months                 Told patient to return for periodic foot care and evaluation due to potential at risk complications.   Helane Gunther DPM

## 2023-07-16 ENCOUNTER — Other Ambulatory Visit: Payer: Self-pay | Admitting: Internal Medicine

## 2023-07-16 DIAGNOSIS — M109 Gout, unspecified: Secondary | ICD-10-CM

## 2023-08-04 ENCOUNTER — Telehealth: Payer: Self-pay | Admitting: Podiatry

## 2023-08-04 NOTE — Telephone Encounter (Signed)
 Patient stated Dr. Zettie Hillock stated he was going to give patient coverings for toes. Patient is stating he never received them

## 2023-08-12 ENCOUNTER — Other Ambulatory Visit: Payer: Self-pay | Admitting: Critical Care Medicine

## 2023-08-12 DIAGNOSIS — E1165 Type 2 diabetes mellitus with hyperglycemia: Secondary | ICD-10-CM

## 2023-08-21 ENCOUNTER — Other Ambulatory Visit: Payer: Self-pay | Admitting: Internal Medicine

## 2023-08-21 DIAGNOSIS — M109 Gout, unspecified: Secondary | ICD-10-CM

## 2023-09-16 ENCOUNTER — Ambulatory Visit: Payer: 59 | Admitting: Internal Medicine

## 2023-10-01 ENCOUNTER — Ambulatory Visit: Payer: Self-pay

## 2023-10-01 NOTE — Telephone Encounter (Signed)
 FYI Only or Action Required?: FYI only for provider  Patient was last seen in primary care on 05/19/2023 by Lawrance Presume, MD. Called Nurse Triage reporting Hip Pain. Symptoms began yesterday. Interventions attempted: Nothing. Symptoms are: stable.  Triage Disposition: See PCP When Office is Open (Within 3 Days) See full note  Patient/caregiver understands and will follow disposition?: Yes           Copied from CRM [id number]. Topic: Clinical - Red Word Triage >> Oct 01, 2023  3:00 PM Oddis Bench wrote: Red Word that prompted transfer to Nurse Triage: Patient is calling about pain in his hip on the left side. Reason for Disposition  [1] MODERATE pain (e.g., interferes with normal activities, limping) AND [2] present > 3 days  Answer Assessment - Initial Assessment Questions 1. LOCATION and RADIATION: Where is the pain located?      ------ Left Hip, radiates to low back area.     2. QUALITY: What does the pain feel like?  (e.g., sharp, dull, aching, burning)     *No Answer*    3. SEVERITY: How bad is the pain? What does it keep you from doing?   (Scale 1-10; or mild, moderate, severe)   -  MILD (1-3): doesn't interfere with normal activities    -  MODERATE (4-7): interferes with normal activities (e.g., work or school) or awakens from sleep, limping    -  SEVERE (8-10): excruciating pain, unable to do any normal activities, unable to walk       ----- Hard for him to stand up, d/t pain.  -------------- Intermittent, 7/10     4. ONSET: When did the pain start? Does it come and go, or is it there all the time?     ---------------- Yesterday    5. WORK OR EXERCISE: Has there been any recent work or exercise that involved this part of the body?      ---- Denies     6. CAUSE: What do you think is causing the hip pain?      ----Unsure    7. AGGRAVATING FACTORS: What makes the hip pain worse? (e.g., walking, climbing stairs, running)     ----- Sitting     8. OTHER SYMPTOMS: Do you have any other symptoms? (e.g., back pain, pain shooting down leg,  fever, rash)     ------------Denies     Additional Information:  -- Attempted to use Aleve  - No relief. --- Attempted to use a heating a pad- Some relief    Attempted to schedule an appt within disp time frame.  None available. CAL line called appropriately.  CAL representative reported they will call the patient back in-order to schedule an appt. Patient verbalized understanding.   Patient educated on pertinent s/s that would warrant emergent help/call 911/ ED/UC.  Patient verbalized understanding and agrees with plan No additional questions/concerns noted during the time of the call.  Protocols used: Hip Pain-A-AH

## 2023-10-02 ENCOUNTER — Ambulatory Visit (HOSPITAL_COMMUNITY): Payer: Self-pay | Admitting: Emergency Medicine

## 2023-10-02 ENCOUNTER — Ambulatory Visit (HOSPITAL_COMMUNITY)
Admission: EM | Admit: 2023-10-02 | Discharge: 2023-10-02 | Disposition: A | Discharge status: Home / Self Care | Payer: Self-pay | Attending: Emergency Medicine | Admitting: Emergency Medicine

## 2023-10-02 ENCOUNTER — Telehealth: Payer: Self-pay | Admitting: Internal Medicine

## 2023-10-02 ENCOUNTER — Other Ambulatory Visit: Payer: Self-pay

## 2023-10-02 ENCOUNTER — Ambulatory Visit (INDEPENDENT_AMBULATORY_CARE_PROVIDER_SITE_OTHER): Payer: Self-pay

## 2023-10-02 ENCOUNTER — Encounter (HOSPITAL_COMMUNITY): Payer: Self-pay | Admitting: Emergency Medicine

## 2023-10-02 DIAGNOSIS — M25552 Pain in left hip: Secondary | ICD-10-CM

## 2023-10-02 MED ORDER — BACLOFEN 5 MG PO TABS
5.0000 mg | ORAL_TABLET | Freq: Two times a day (BID) | ORAL | 0 refills | Status: DC | PRN
  Filled 2023-10-02: qty 20, 10d supply, fill #0

## 2023-10-02 MED ORDER — IBUPROFEN 200 MG PO TABS
ORAL_TABLET | ORAL | Status: AC
Start: 2023-10-02 — End: 2023-10-02
  Filled 2023-10-02: qty 3

## 2023-10-02 MED ORDER — NAPROXEN 500 MG PO TABS
500.0000 mg | ORAL_TABLET | Freq: Two times a day (BID) | ORAL | 0 refills | Status: DC
  Filled 2023-10-02: qty 30, 15d supply, fill #0

## 2023-10-02 MED ORDER — IBUPROFEN 200 MG PO TABS
600.0000 mg | ORAL_TABLET | Freq: Once | ORAL | Status: AC
  Administered 2023-10-02: 600 mg via ORAL

## 2023-10-02 NOTE — ED Triage Notes (Signed)
 Left hip and lower left back pain.  Also left posterior upper leg pain.    Has taken aleve .

## 2023-10-02 NOTE — ED Notes (Signed)
 Has been out of medicine for a week.  [facility]

## 2023-10-02 NOTE — Telephone Encounter (Signed)
 Copied from CRM [id number]. Topic: Clinical - Prescription Issue >> Oct 02, 2023 12:47 PM Felizardo Hotter wrote:  Reason for CRM: Pt stated he needs all his blood pressure and diabetic medication refilled and sent to: [facility] [address], [address] [place] [address] Phone: [phone] Fax: [phone]. Pt could not recall names of medications. Please call pt at [phone].

## 2023-10-02 NOTE — ED Provider Notes (Signed)
 [facility]    CSN: [id number] Arrival date & time: 10/02/23  1647      History   Chief Complaint Chief Complaint  Patient presents with   Back Pain    HPI Justin Houston is a 58 y.o. male.  Left hip pain for a few days. Sometimes felt in the low back and outer thigh but does not radiate into glute or leg. Pain rated 8/10. Worse with walking, sitting, bending. Denies any injury trauma or fall. No numbness/tingling or weakness.  Has taken aleve  yesterday, no medicines today  Does report left hip pain in the past. Had similar symptoms 3 years ago. Negative imaging.  His primary care visit is in 3 weeks  Past Medical History:  Diagnosis Date   Dermatitis    Diabetes mellitus    GERD (gastroesophageal reflux disease)    Gout    Hyperlipidemia    Hypertension    Lipoma of scalp    Mass of head, right postauricular SQ 7x4cm 09/29/2013   Obesity    Toenail fungus 08/31/2018    Patient Active Problem List   Diagnosis Date Noted   History of colonic polyps    Benign neoplasm of transverse colon    Benign neoplasm of rectum    Left hip pain 10/03/2020   Varicose veins of both legs with edema 08/31/2018   GERD (gastroesophageal reflux disease) 08/16/2016   Diabetic retinopathy (HCC) 12/08/2014   Umbilical hernia without obstruction and without gangrene 09/29/2013   Glaucoma 05/26/2013   Gout 08/01/2012   BMI 45.0-49.9, adult (HCC) 08/01/2012   Hypertension 07/20/2011   Diabetes mellitus type 2 with retinopathy (HCC) 07/20/2011   Hyperlipidemia associated with type 2 diabetes mellitus (HCC) 07/20/2011    Past Surgical History:  Procedure Laterality Date   COLONOSCOPY WITH PROPOFOL  N/A 12/01/2014   Procedure: COLONOSCOPY WITH PROPOFOL ;  Surgeon: Janel Medford, MD;  Location: [facility];  Service: Endoscopy;  Laterality: N/A;   COLONOSCOPY WITH PROPOFOL  N/A 12/20/2021   Procedure: COLONOSCOPY WITH PROPOFOL ;  Surgeon: Asencion Blacksmith, MD;  Location: [facility];  Service: Gastroenterology;  Laterality: N/A;   LIPOMA EXCISION N/A 01/28/2020   Procedure: EXCISION RIGHT SCALP LIPOMA;  Surgeon: Barb Bonito, MD;  Location: [facility];  Service: Plastics;  Laterality: N/A;   POLYPECTOMY  12/20/2021   Procedure: POLYPECTOMY;  Surgeon: Asencion Blacksmith, MD;  Location: [facility];  Service: Gastroenterology;;   right arm ORIF         Home Medications    Prior to Admission medications   Medication Sig Start Date End Date Taking? Authorizing Provider  Baclofen 5 MG TABS Take 1 tablet (5 mg total) by mouth 2 (two) times daily as needed. 10/02/23  Yes Wally Behan, Ivette Marks, PA-C  naproxen  (NAPROSYN ) 500 MG tablet Take 1 tablet (500 mg total) by mouth 2 (two) times daily. 10/02/23  Yes Raymar Joiner, Ivette Marks, PA-C  amLODipine  (NORVASC ) 10 MG tablet TAKE 1 TABLET BY MOUTH ONCE DAILY 07/09/23   Lawrance Presume, MD  atorvastatin  (LIPITOR) 40 MG tablet Take 1 tablet (40 mg total) by mouth daily. 03/05/23   Hassie Lint, PA-C  Blood Glucose Monitoring Suppl (ONETOUCH VERIO) w/Device KIT Use as instructed to check blood sugar 3 times daily. N82.956 Z79.4 05/19/23   Lawrance Presume, MD  Blood Pressure Monitoring (BLOOD PRESSURE KIT) DEVI Use to measure blood pressure 03/05/23   Dulce Gibbs M, PA-C  carvedilol  (COREG ) 25 MG tablet Take 1 tablet (  25 mg total) by mouth 2 (two) times daily with a meal. 03/05/23   McClung, Stan Eans, PA-C  cetirizine  (ZYRTEC  ALLERGY) 10 MG tablet Take 1 tablet (10 mg total) by mouth daily. 01/16/22   Mayers, Cari S, PA-C  chlorthalidone  (HYGROTON ) 25 MG tablet TAKE 1 TABLET BY MOUTH DAILY. 03/05/23   Hassie Lint, PA-C  clobetasol  ointment (TEMOVATE ) 0.05 % Apply 1 Application topically 3 (three) times daily as needed (rash.). 05/28/22   Vernell Goldsmith, MD  dapagliflozin  propanediol (FARXIGA ) 10 MG TABS tablet TAKE 1 TABLET BY MOUTH ONCE DAILY 07/08/23   Lawrance Presume, MD  Dulaglutide  (TRULICITY ) 1.5  MG/0.5ML SOAJ Inject 1.5 mg into the skin once a week. 03/05/23   Hassie Lint, PA-C  fluticasone  (FLONASE ) 50 MCG/ACT nasal spray Place 2 sprays into both nostrils daily. 03/05/23   Hassie Lint, PA-C  GLUCOSAMINE HCL PO Take 2,000 mg by mouth daily. With Vitamin D    [provider]  glucose blood (ONETOUCH VERIO) test strip USE AS INSTRUCTED TO CHECK BLOOD SUGAR 3 TIMES DAILY 05/19/23   Lawrance Presume, MD  hydrALAZINE  (APRESOLINE ) 100 MG tablet Take 1 tablet (100 mg total) by mouth 3 (three) times daily. 03/05/23   Hassie Lint, PA-C  insulin  glargine (LANTUS ) 100 UNIT/ML injection Inject 0.4 mLs (40 Units total) into the skin 2 (two) times daily. 03/05/23   McClung, Angela M, PA-C  Insulin  Syringe-Needle U-100 (BD INSULIN  SYRINGE U/F) 31G X 5/16 1 ML MISC Inject 1 each into the skin 2 (two) times daily. 03/05/23   Hassie Lint, PA-C  latanoprost (XALATAN) 0.005 % ophthalmic solution Place 1 drop into both eyes at bedtime. 10/01/21   [provider]  metFORMIN  (GLUCOPHAGE ) 1000 MG tablet TAKE 1 TABLET BY MOUTH TWICE DAILY WITH MEALS 08/12/23   Lawrance Presume, MD  omeprazole  (PRILOSEC) 20 MG capsule TAKE 1 CAPSULE BY MOUTH ONCE DAILY 07/08/23   Lawrance Presume, MD  OneTouch Delica Lancets 33G MISC Use as instructed to check blood sugar 3 times daily. Z61.096 Z79.4 05/19/23   Lawrance Presume, MD  spironolactone  (ALDACTONE ) 50 MG tablet TAKE 1 TABLET BY MOUTH EVERY DAY 06/12/23   Lawrance Presume, MD  STIMULANT LAXATIVE 8.6-50 MG tablet TAKE 1 TABLET BY MOUTH DAILY 05/21/19   Vernell Goldsmith, MD    Family History Family History  Problem Relation Age of Onset   Stroke Mother    Hypertension Mother    Diabetes Maternal Grandmother    Prostate cancer Maternal Grandfather    Diabetes Other     Social History Social History   Tobacco Use   Smoking status: Former    Current packs/day: 0.00    Types: Cigarettes    Quit date: 09/05/1994    Years  since quitting: 29.0   Smokeless tobacco: Never  Vaping Use   Vaping status: Never Used  Substance Use Topics   Alcohol use: No    Alcohol/week: 0.0 standard drinks of alcohol   Drug use: No     Allergies   Losartan potassium-hctz and Cozaar [losartan potassium]   Review of Systems Review of Systems As per HPI   Physical Exam Triage Vital Signs ED Triage Vitals  Encounter Vitals Group     BP 10/02/23 1808 (!) 172/80     Girls Systolic BP Percentile --      Girls Diastolic BP Percentile --      Boys Systolic BP Percentile --  Boys Diastolic BP Percentile --      Pulse Rate 10/02/23 1808 80     Resp 10/02/23 1808 20     Temp 10/02/23 1808 98.8 F (37.1 C)     Temp Source 10/02/23 1808 Oral     SpO2 10/02/23 1808 96 %     Weight --      Height --      Head Circumference --      Peak Flow --      Pain Score 10/02/23 1805 8     Pain Loc --      Pain Education --      Exclude from Growth Chart --    No data found.  Updated Vital Signs BP (!) 157/82 (BP Location: Left Arm) Comment (BP Location): large cuff  Pulse 63   Temp 98.7 F (37.1 C) (Oral)   Resp 18   SpO2 98%    Physical Exam Vitals and nursing note reviewed.  Constitutional:      General: He is not in acute distress. HENT:     Mouth/Throat:     Pharynx: Oropharynx is clear.   Cardiovascular:     Rate and Rhythm: Normal rate and regular rhythm.     Pulses: Normal pulses.     Heart sounds: Normal heart sounds.  Pulmonary:     Effort: Pulmonary effort is normal.     Breath sounds: Normal breath sounds.   Musculoskeletal:        General: Normal range of motion.     Lumbar back: Normal. No tenderness or bony tenderness. Negative left straight leg raise test.     Left hip: Normal. No bony tenderness. Normal range of motion. Normal strength.     Comments: Strength and sensation equal, intact. No bony tenderness of lumbar spine, paraspinals, left hip or thigh.    Skin:    General: Skin is  warm and dry.     Capillary Refill: Capillary refill takes less than 2 seconds.   Neurological:     Mental Status: He is alert and oriented to person, place, and time.     UC Treatments / Results  Labs (all labs ordered are listed, but only abnormal results are displayed) Labs Reviewed - No data to display  EKG   Radiology DG Hip Unilat W or Wo Pelvis 2-3 Views Left Result Date: 10/02/2023 CLINICAL DATA:  Left hip pain EXAM: DG HIP (WITH OR WITHOUT PELVIS) 2-3V LEFT COMPARISON:  10/05/2020 FINDINGS: Normal alignment. No acute fracture or dislocation. A a roughly 11.7 cm rounded mass is developed overlying the left hip, not well characterized on this examination. IMPRESSION: 1. No acute fracture or dislocation. 2. 11.7 cm rounded mass overlying the left hip, not well characterized on this examination. Contrast enhanced CT imaging is recommended further evaluation. Electronically Signed   By: Worthy Heads M.D.   On: 10/02/2023 19:30    Procedures Procedures (including critical care time)  Medications Ordered in UC Medications  ibuprofen (ADVIL) tablet 600 mg (600 mg Oral Given 10/02/23 1838)    Initial Impression / Assessment and Plan / UC Course  I have reviewed the triage vital signs and the nursing notes.  Pertinent labs & imaging results that were available during my care of the patient were reviewed by me and considered in my medical decision making (see chart for details).  Ibuprofen dose given for pain in clinic  Left hip xray without bony abnormality. Radiology reads rounded mass overlying, however this  is just items in patient pocket that were not removed prior to imaging. No CT follow up needed at this time. Naproxen  BID for pain and baclofen with drowsy precautions. He has PCP follow up in 3 weeks. Return and ED precautions. Agrees to plan, no questions   Final Clinical Impressions(s) / UC Diagnoses   Final diagnoses:  Left hip pain     Discharge Instructions       There are some mild degenerative changes on your xray (like arthritis). Staff will call you tomorrow if the radiologist sees anything differently.  Ibuprofen dose tonight should help with pain. Starting tomorrow morning you can take the naproxen  twice daily. Take with food! Don't use any other ibuprofen/Advil or Aleve  while taking the naproxen .  You can take the muscle relaxer Baclofen twice daily. If the medication makes you drowsy, take only at bed time.  Please follow up with your primary care provider at your visit in 3 weeks    ED Prescriptions     Medication Sig Dispense Auth. Provider   naproxen  (NAPROSYN ) 500 MG tablet Take 1 tablet (500 mg total) by mouth 2 (two) times daily. 30 tablet Grisell Bissette, PA-C   Baclofen 5 MG TABS Take 1 tablet (5 mg total) by mouth 2 (two) times daily as needed. 20 tablet Fred Hammes, Ivette Marks, PA-C      PDMP not reviewed this encounter.   Jakaiden Fill, Beth Brooke 10/02/23 1958

## 2023-10-02 NOTE — Discharge Instructions (Addendum)
 There are some mild degenerative changes on your xray (like arthritis). Staff will call you tomorrow if the radiologist sees anything differently.  Ibuprofen dose tonight should help with pain. Starting tomorrow morning you can take the naproxen  twice daily. Take with food! Don't use any other ibuprofen/Advil or Aleve  while taking the naproxen .  You can take the muscle relaxer Baclofen twice daily. If the medication makes you drowsy, take only at bed time.  Please follow up with your primary care provider at your visit in 3 weeks

## 2023-10-02 NOTE — Telephone Encounter (Signed)
 Patient has appointment on 10/24/2023

## 2023-10-03 ENCOUNTER — Other Ambulatory Visit: Payer: Self-pay

## 2023-10-03 NOTE — Telephone Encounter (Signed)
 Called but no answer. LVM that requested medication have refills and will need to call the pharmacy to get them ready.

## 2023-10-06 NOTE — Telephone Encounter (Signed)
 Called but no answer. LVM informing that requested medication have refills and to call pharmacy to have medications filled.

## 2023-10-15 ENCOUNTER — Ambulatory Visit: Admitting: Podiatry

## 2023-10-15 ENCOUNTER — Encounter: Payer: Self-pay | Admitting: Podiatry

## 2023-10-15 DIAGNOSIS — E1151 Type 2 diabetes mellitus with diabetic peripheral angiopathy without gangrene: Secondary | ICD-10-CM

## 2023-10-15 DIAGNOSIS — E1169 Type 2 diabetes mellitus with other specified complication: Secondary | ICD-10-CM

## 2023-10-15 DIAGNOSIS — L84 Corns and callosities: Secondary | ICD-10-CM | POA: Diagnosis not present

## 2023-10-15 DIAGNOSIS — B351 Tinea unguium: Secondary | ICD-10-CM | POA: Diagnosis not present

## 2023-10-15 NOTE — Progress Notes (Signed)
 This patient returns to my office for at risk foot care.  This patient requires this care by a professional since this patient will be at risk due to having diabetes.  This patient is unable to cut nails himself since the patient cannot reach his nails.These nails are painful walking and wearing shoes.  He also has callus on the outside of his left foot.This patient presents for at risk foot care today.  General Appearance  Alert, conversant and in no acute stress.  Vascular  Dorsalis pedis and posterior tibial  pulses are palpable  bilaterally.  Capillary return is within normal limits  bilaterally. Temperature is within normal limits  bilaterally.  Neurologic  Senn-Weinstein monofilament wire test within normal limits  bilaterally. Muscle power within normal limits bilaterally.  Nails Thick disfigured discolored nails with subungual debris  from hallux to fifth toes bilaterally. No evidence of bacterial infection or drainage bilaterally.  Orthopedic  No limitations of motion  feet .  No crepitus or effusions noted.  No bony pathology or digital deformities noted.  Skin  normotropic skin with no porokeratosis noted bilaterally.  No signs of infections or ulcers noted.   Callus lateral aspect fifth metabase left foot.  Onychomycosis  Pain in right toes  Pain in left toes  Callus left foot.  Consent was obtained for treatment procedures.   Mechanical debridement of nails 1-5  bilaterally performed with a nail nipper.  Filed with dremel without incident. Debride callus with # 15 blade and dremel tool. Dispense diabetic shoes.   Return office visit     3  months                 Told patient to return for periodic foot care and evaluation due to potential at risk complications.   Helane Gunther DPM

## 2023-10-20 NOTE — Progress Notes (Deleted)
   Acute Office Visit  Subjective:     Patient ID: Justin Houston, male    DOB: [birth year], 58 y.o.   MRN: [id number]  No chief complaint on file.   HPI Patient is in today for hip pain  dx HTN T2DM  ROS      Objective:    There were no vitals taken for this visit. {Vitals History (Optional):23777}  Physical Exam  No results found for any visits on 10/23/23.      Assessment & Plan:   Problem List Items Addressed This Visit   None   No orders of the defined types were placed in this encounter.   No follow-ups on file.  Belvie Silvan, MD

## 2023-10-23 ENCOUNTER — Ambulatory Visit: Admitting: Critical Care Medicine

## 2023-10-23 ENCOUNTER — Encounter: Payer: Self-pay | Admitting: Critical Care Medicine

## 2023-10-23 ENCOUNTER — Ambulatory Visit: Attending: Critical Care Medicine | Admitting: Critical Care Medicine

## 2023-10-23 VITALS — BP 122/81 | HR 66 | Wt 310.8 lb

## 2023-10-23 DIAGNOSIS — E113293 Type 2 diabetes mellitus with mild nonproliferative diabetic retinopathy without macular edema, bilateral: Secondary | ICD-10-CM

## 2023-10-23 DIAGNOSIS — G8929 Other chronic pain: Secondary | ICD-10-CM | POA: Insufficient documentation

## 2023-10-23 DIAGNOSIS — Z794 Long term (current) use of insulin: Secondary | ICD-10-CM

## 2023-10-23 DIAGNOSIS — I10 Essential (primary) hypertension: Secondary | ICD-10-CM

## 2023-10-23 DIAGNOSIS — Z6841 Body Mass Index (BMI) 40.0 and over, adult: Secondary | ICD-10-CM

## 2023-10-23 DIAGNOSIS — M25552 Pain in left hip: Secondary | ICD-10-CM

## 2023-10-23 DIAGNOSIS — E119 Type 2 diabetes mellitus without complications: Secondary | ICD-10-CM | POA: Diagnosis not present

## 2023-10-23 DIAGNOSIS — K219 Gastro-esophageal reflux disease without esophagitis: Secondary | ICD-10-CM | POA: Diagnosis not present

## 2023-10-23 DIAGNOSIS — E1169 Type 2 diabetes mellitus with other specified complication: Secondary | ICD-10-CM

## 2023-10-23 DIAGNOSIS — E785 Hyperlipidemia, unspecified: Secondary | ICD-10-CM | POA: Diagnosis not present

## 2023-10-23 DIAGNOSIS — Z7985 Long-term (current) use of injectable non-insulin antidiabetic drugs: Secondary | ICD-10-CM

## 2023-10-23 DIAGNOSIS — J302 Other seasonal allergic rhinitis: Secondary | ICD-10-CM

## 2023-10-23 DIAGNOSIS — Z7984 Long term (current) use of oral hypoglycemic drugs: Secondary | ICD-10-CM

## 2023-10-23 DIAGNOSIS — E1165 Type 2 diabetes mellitus with hyperglycemia: Secondary | ICD-10-CM

## 2023-10-23 LAB — POCT GLYCOSYLATED HEMOGLOBIN (HGB A1C): HbA1c, POC (controlled diabetic range): 6 % (ref 0.0–7.0)

## 2023-10-23 LAB — GLUCOSE, POCT (MANUAL RESULT ENTRY): POC Glucose: 120 mg/dL — AB (ref 70–99)

## 2023-10-23 MED ORDER — ATORVASTATIN CALCIUM 40 MG PO TABS: 40.0000 mg | ORAL_TABLET | Freq: Every day | ORAL | 3 refills | Status: AC

## 2023-10-23 MED ORDER — TRULICITY 1.5 MG/0.5ML ~~LOC~~ SOAJ
1.5000 mg | SUBCUTANEOUS | 3 refills | Status: DC
Start: 2023-10-23 — End: 2024-02-06

## 2023-10-23 MED ORDER — CETIRIZINE HCL 10 MG PO TABS: 10.0000 mg | ORAL_TABLET | Freq: Every day | ORAL | 11 refills | Status: AC

## 2023-10-23 MED ORDER — NAPROXEN 500 MG PO TABS: 500.0000 mg | ORAL_TABLET | Freq: Two times a day (BID) | ORAL | 2 refills | Status: DC

## 2023-10-23 MED ORDER — AMLODIPINE BESYLATE 10 MG PO TABS: 10.0000 mg | ORAL_TABLET | Freq: Every day | ORAL | 3 refills | Status: DC

## 2023-10-23 MED ORDER — OMEPRAZOLE 20 MG PO CPDR: 20.0000 mg | DELAYED_RELEASE_CAPSULE | Freq: Every day | ORAL | 1 refills | Status: DC

## 2023-10-23 MED ORDER — INSULIN GLARGINE 100 UNIT/ML ~~LOC~~ SOLN: 40.0000 [IU] | Freq: Two times a day (BID) | SUBCUTANEOUS | 3 refills | Status: DC

## 2023-10-23 MED ORDER — SPIRONOLACTONE 50 MG PO TABS: 50.0000 mg | ORAL_TABLET | Freq: Every day | ORAL | 2 refills | Status: AC

## 2023-10-23 MED ORDER — METFORMIN HCL 1000 MG PO TABS: 1000.0000 mg | ORAL_TABLET | Freq: Two times a day (BID) | ORAL | 0 refills | Status: DC

## 2023-10-23 MED ORDER — CHLORTHALIDONE 25 MG PO TABS: ORAL_TABLET | ORAL | 2 refills | Status: DC

## 2023-10-23 MED ORDER — CARVEDILOL 25 MG PO TABS
25.0000 mg | ORAL_TABLET | Freq: Two times a day (BID) | ORAL | 3 refills | Status: DC
Start: 2023-10-23 — End: 2024-01-02

## 2023-10-23 MED ORDER — DAPAGLIFLOZIN PROPANEDIOL 10 MG PO TABS: 10.0000 mg | ORAL_TABLET | Freq: Every day | ORAL | 1 refills | Status: AC

## 2023-10-23 MED ORDER — FLUTICASONE PROPIONATE 50 MCG/ACT NA SUSP: 2.0000 | Freq: Every day | NASAL | 6 refills | Status: AC

## 2023-10-23 MED ORDER — HYDRALAZINE HCL 100 MG PO TABS
100.0000 mg | ORAL_TABLET | Freq: Three times a day (TID) | ORAL | 3 refills | Status: DC
Start: 2023-10-23 — End: 2024-01-01

## 2023-10-23 NOTE — Assessment & Plan Note (Signed)
 Refill Naprosyn  and refer to orthopedics

## 2023-10-23 NOTE — Patient Instructions (Signed)
 Labs obtained today Medication refills given  Form filled out for school job  Referral to orthopedics for your hip and shoulder pain and Naprosyn  refilled  Return in September for follow-up with Dr. Vicci primary care

## 2023-10-23 NOTE — Assessment & Plan Note (Signed)
 No changes needed meds refill

## 2023-10-23 NOTE — Assessment & Plan Note (Signed)
Well controlled on current medications- no changes.

## 2023-10-23 NOTE — Progress Notes (Signed)
 Established Patient Office Visit  Subjective   Patient ID: Justin Houston, male    DOB: [birth year]  Age: 58 y.o. MRN: [id number]  Chief Complaint  Patient presents with   Medical Management of Chronic Issues    11/2021 Justin Houston is 58 year old male presenting today for follow up. He has history of type 2 diabetes, diabetic retinopathy and hypertension.   Diabetes medications include: farxiga  5 mg daily, trulicity  0.75 weekly, metformin  1000 mg BID, levemir  40 units BID Today's Hemoglobin A1C= 7.9%, previously 6.7% (09/2020). He does take his blood sugars at home, but needs a new battery for the machine.   He has an upcoming podiatry appointment 01/28/2022 and dentistry appointment 08/25. He recently saw opthalmology in June and reports he is obtaining new glasses soon. He also has a colonoscopy scheduled with Justin Houston on 12/27/2021.   Hypertension medications include: amlodipine  10 mg daily, coreg  25 mg BID, chlorthalidone  25 mg daily, hydralazine  100 TID, and atorvastatin  40 mg daily.  Todays blood pressure= 149/80. He does not have a blood pressure machine at home.   Per patient, he has had a hard time recently controlling his diet. He also does not exercise much, aside from walking at his job.   Due for screening labs, hemoglobin A1C, and microalbumin today  12/5 patient seen in return follow-up for hypertension diabetes on arrival blood pressure remains elevated 158/78.  He has been compliant with his medications but sometimes forgets to take the afternoon dose of his carvedilol  and hydralazine .  He does need a follow-up shingles vaccination and flu shot he agrees to receive both at this visit.  Blood sugar on arrival is 106 and hemoglobin A1c is in excellent control at 6.5  The patient was started on Aldactone  25 mg daily at the last visit this dose will need to be increased  05/28/22 Patient returns in follow-up after last visit in December prior history of diabetes and  hypertension blood pressure on arrival is improved 135/78 A1c is at goal there are no new complaints except for a rash on the right arm  10/23/23 This patient is a former primary care patient of mine since retirement was assigned to Justin. Vicci who last saw the patient in February of this year documentation of assessment is as below and patient states his blood sugar has been under good control since then  The problem has been right shoulder pain and left hip pain for which she went to urgent care recently.  He was given Naprosyn  he took it a few times that helped but then lost the prescription.  He has yet to see orthopedics for this condition.  He does need follow-up labs.  There are no other complaints.  Blood pressure is good on arrival.  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  Continue Trulicity  1.5 mg once a week, metformin  1 g twice a day, Farxiga  10 mg daily and Lantus  insulin  40 units twice a day. Encouraged him to continue trying to eat healthy. Encouraged him to try getting some exercise outside of work like 30 minutes of moderate intensity exercise 3 to 5 days a week for 30 minutes. - POCT glycosylated hemoglobin (Hb A1C) - POCT glucose (manual entry) - Microalbumin / creatinine urine  ratio - Blood Glucose Monitoring Suppl (ONETOUCH VERIO) w/Device KIT; Use as instructed to check blood sugar 3 times daily. E11.319 Z79.4  Dispense: 1 kit; Refill: 0 - glucose blood (ONETOUCH VERIO) test strip; USE AS INSTRUCTED TO CHECK BLOOD SUGAR 3 TIMES DAILY  Dispense: 100 strip; Refill: 11 - OneTouch Delica Lancets 33G MISC; Use as instructed to check blood sugar 3 times daily. E11.319 Z79.4  Dispense: 100 each; Refill: 11   2. Insulin  long-term use (HCC) 3. Long-term (current)  use of injectable non-insulin  antidiabetic drugs See #1 above.   4. Diabetes mellitus treated with oral medication (HCC) See #1 above.   5. Hypertension associated with diabetes (HCC) Repeat blood pressure closer to goal.  Continue spironolactone  50 mg daily, Norvasc  10 mg and chlorthalidone  25 mg daily, carvedilol  25 mg twice a day, hydralazine  100 mg 3 times a day. - For home use only DME Other see comment   6. Hyperlipidemia associated with type 2 diabetes mellitus (HCC) Continue atorvastatin  40 mg daily - Lipid panel   7. Encounter for physical examination related to employment PPD will be placed today.  He will return in 48 to 72 hours to have it read.   8. Screening examination for pulmonary tuberculosis See #7 above.   9. Need for hepatitis B screening test We will check to see if he is adequately immunized.  If not we will either give the vaccine series or a booster depending on his level. - Hepatitis B surface antibody,quantitative; Future - Hepatitis B surface antigen; Future       Patient Active Problem List   Diagnosis Date Noted   Chronic right shoulder pain 10/23/2023   History of colonic polyps    Benign neoplasm of transverse colon    Benign neoplasm of rectum    Left hip pain 10/03/2020   Varicose veins of both legs with edema 08/31/2018   GERD (gastroesophageal reflux disease) 08/16/2016   Diabetic retinopathy (HCC) 12/08/2014   Umbilical hernia without obstruction and without gangrene 09/29/2013   Glaucoma 05/26/2013   Gout 08/01/2012   BMI 45.0-49.9, adult (HCC) 08/01/2012   Hypertension 07/20/2011   Diabetes mellitus type 2 with retinopathy (HCC) 07/20/2011   Hyperlipidemia associated with type 2 diabetes mellitus (HCC) 07/20/2011   Type 2 diabetes mellitus with mild nonproliferative retinopathy without macular edema, with long-term current use of insulin  (HCC) 07/20/2011   Past Medical History:  Diagnosis Date   Dermatitis    Diabetes mellitus     GERD (gastroesophageal reflux disease)    Gout    Hyperlipidemia    Hypertension    Lipoma of scalp    Mass of head, right postauricular SQ 7x4cm 09/29/2013   Obesity    Toenail fungus 08/31/2018   Past Surgical History:  Procedure Laterality Date   COLONOSCOPY WITH PROPOFOL  N/A 12/01/2014   Procedure: COLONOSCOPY WITH PROPOFOL ;  Surgeon: Toribio SHAUNNA Cedar, MD;  Location: [facility];  Service: Endoscopy;  Laterality: N/A;   COLONOSCOPY WITH PROPOFOL  N/A 12/20/2021   Procedure: COLONOSCOPY WITH PROPOFOL ;  Surgeon: Aneita Gwendlyn DASEN, MD;  Location: [facility];  Service: Gastroenterology;  Laterality: N/A;   LIPOMA EXCISION N/A 01/28/2020   Procedure: EXCISION RIGHT SCALP LIPOMA;  Surgeon: Elisabeth Craig RAMAN, MD;  Location: [facility];  Service: Plastics;  Laterality: N/A;   POLYPECTOMY  12/20/2021   Procedure: POLYPECTOMY;  Surgeon: Aneita Gwendlyn DASEN, MD;  Location: [facility];  Service: Gastroenterology;;   right arm ORIF     Social  History   Tobacco Use   Smoking status: Former    Current packs/day: 0.00    Types: Cigarettes    Quit date: 09/05/1994    Years since quitting: 29.1   Smokeless tobacco: Never  Vaping Use   Vaping status: Never Used  Substance Use Topics   Alcohol use: No    Alcohol/week: 0.0 standard drinks of alcohol   Drug use: No   Family History  Problem Relation Age of Onset   Stroke Mother    Hypertension Mother    Diabetes Maternal Grandmother    Prostate cancer Maternal Grandfather    Diabetes Other    Allergies  Allergen Reactions   Losartan Potassium-Hctz Hives   Cozaar [Losartan Potassium] Hives    Hives       Review of Systems  Constitutional: Negative.  Negative for chills, diaphoresis, fever, malaise/fatigue and weight loss.  HENT: Negative.  Negative for congestion, hearing loss, nosebleeds, sore throat and tinnitus.   Eyes: Negative.  Negative for blurred vision, photophobia and redness.  Respiratory: Negative.  Negative  for cough, hemoptysis, sputum production, shortness of breath, wheezing and stridor.   Cardiovascular: Negative.  Negative for chest pain, palpitations, orthopnea, claudication, leg swelling and PND.  Gastrointestinal: Negative.  Negative for abdominal pain, blood in stool, constipation, diarrhea, heartburn, nausea and vomiting.  Genitourinary: Negative.  Negative for dysuria, flank pain, frequency, hematuria and urgency.  Musculoskeletal:  Positive for joint pain. Negative for back pain, falls, myalgias and neck pain.  Skin:  Negative for itching and rash.  Neurological: Negative.  Negative for dizziness, tingling, tremors, sensory change, speech change, focal weakness, seizures, loss of consciousness, weakness and headaches.  Endo/Heme/Allergies: Negative.  Negative for environmental allergies and polydipsia. Does not bruise/bleed easily.  Psychiatric/Behavioral: Negative.  Negative for depression, memory loss, substance abuse and suicidal ideas. The patient is not nervous/anxious and does not have insomnia.       Objective:     BP 122/81 (BP Location: Left Arm, Patient Position: Sitting, Cuff Size: Large)   Pulse 66   Wt (!) 310 lb 12.8 oz (141 kg)   SpO2 97%   BMI 43.35 kg/m     Physical Exam Vitals reviewed.  Constitutional:      Appearance: Normal appearance. He is well-developed. He is obese. He is not diaphoretic.  HENT:     Head: Normocephalic and atraumatic.     Right Ear: Tympanic membrane, ear canal and external ear normal.     Left Ear: Tympanic membrane, ear canal and external ear normal.     Nose: No nasal deformity, septal deviation, mucosal edema or rhinorrhea.     Right Sinus: No maxillary sinus tenderness or frontal sinus tenderness.     Left Sinus: No maxillary sinus tenderness or frontal sinus tenderness.     Mouth/Throat:     Mouth: Mucous membranes are moist.     Pharynx: Oropharynx is clear. No oropharyngeal exudate.  Eyes:     General: No scleral  icterus.    Conjunctiva/sclera: Conjunctivae normal.     Pupils: Pupils are equal, round, and reactive to light.  Neck:     Thyroid: No thyromegaly.     Vascular: No carotid bruit or JVD.     Trachea: Trachea normal. No tracheal tenderness or tracheal deviation.  Cardiovascular:     Rate and Rhythm: Normal rate and regular rhythm.     Chest Wall: PMI is not displaced.     Pulses: Normal pulses. No decreased pulses.  Heart sounds: Normal heart sounds, S1 normal and S2 normal. Heart sounds not distant. No murmur heard.    No systolic murmur is present.     No diastolic murmur is present.     No friction rub. No gallop. No S3 or S4 sounds.  Pulmonary:     Effort: Pulmonary effort is normal. No tachypnea, accessory muscle usage or respiratory distress.     Breath sounds: Normal breath sounds. No stridor. No decreased breath sounds, wheezing, rhonchi or rales.  Chest:     Chest wall: No tenderness.  Abdominal:     General: Bowel sounds are normal. There is no distension.     Palpations: Abdomen is soft. Abdomen is not rigid.     Tenderness: There is no abdominal tenderness. There is no guarding or rebound.  Musculoskeletal:        General: Tenderness present. Normal range of motion.     Cervical back: Normal range of motion and neck supple. No edema, erythema or rigidity. No muscular tenderness. Normal range of motion.     Comments: Point tenderness on the hip bursa on the left good range of motion of the hip negative straight leg raise sign ambulates well  For the shoulder he has tenderness at the [facility] joint and limited range of motion elevating the shoulder  Lymphadenopathy:     Head:     Right side of head: No submental or submandibular adenopathy.     Left side of head: No submental or submandibular adenopathy.     Cervical: No cervical adenopathy.  Skin:    General: Skin is warm and dry.     Coloration: Skin is not pale.     Findings: No rash.     Nails: There is no clubbing.   Neurological:     Mental Status: He is alert and oriented to person, place, and time.     Sensory: No sensory deficit.  Psychiatric:        Mood and Affect: Mood normal.        Speech: Speech normal.        Behavior: Behavior normal.      Results for orders placed or performed in visit on 10/23/23  POCT glycosylated hemoglobin (Hb A1C)  Result Value Ref Range   Hemoglobin A1C     HbA1c POC (<> result, manual entry)     HbA1c, POC (prediabetic range)     HbA1c, POC (controlled diabetic range) 6.0 0.0 - 7.0 %  POCT glucose (manual entry)  Result Value Ref Range   POC Glucose 120 (A) 70 - 99 mg/dl         The 89-bzjm ASCVD risk score (Arnett DK, et al., 2019) is: 22.7%    Assessment & Plan:   Problem List Items Addressed This Visit       Cardiovascular and Mediastinum   Hypertension   Well-controlled on current medications no changes      Relevant Medications   amLODipine  (NORVASC ) 10 MG tablet   atorvastatin  (LIPITOR) 40 MG tablet   carvedilol  (COREG ) 25 MG tablet   chlorthalidone  (HYGROTON ) 25 MG tablet   hydrALAZINE  (APRESOLINE ) 100 MG tablet   spironolactone  (ALDACTONE ) 50 MG tablet     Digestive   GERD (gastroesophageal reflux disease)   Relevant Medications   omeprazole  (PRILOSEC) 20 MG capsule     Endocrine   Type 2 diabetes mellitus with mild nonproliferative retinopathy without macular edema, with long-term current use of insulin  (HCC)   No  changes needed meds refill      Relevant Medications   atorvastatin  (LIPITOR) 40 MG tablet   dapagliflozin  propanediol (FARXIGA ) 10 MG TABS tablet   Dulaglutide  (TRULICITY ) 1.5 MG/0.5ML SOAJ   insulin  glargine (LANTUS ) 100 UNIT/ML injection   metFORMIN  (GLUCOPHAGE ) 1000 MG tablet     Other   Left hip pain - Primary   Refill Naprosyn  and refer to orthopedics      Relevant Orders   AMB referral to orthopedics   Chronic right shoulder pain   As for hip pain assessment      Relevant Medications    naproxen  (NAPROSYN ) 500 MG tablet   Other Relevant Orders   AMB referral to orthopedics   Other Visit Diagnoses       Type 2 diabetes mellitus with morbid obesity (HCC)       Relevant Medications   atorvastatin  (LIPITOR) 40 MG tablet   dapagliflozin  propanediol (FARXIGA ) 10 MG TABS tablet   Dulaglutide  (TRULICITY ) 1.5 MG/0.5ML SOAJ   insulin  glargine (LANTUS ) 100 UNIT/ML injection   metFORMIN  (GLUCOPHAGE ) 1000 MG tablet   Other Relevant Orders   POCT glycosylated hemoglobin (Hb A1C) (Completed)   POCT glucose (manual entry) (Completed)   CMP14+EGFR     Type 2 diabetes mellitus with hyperglycemia, with long-term current use of insulin  (HCC)       Relevant Medications   amLODipine  (NORVASC ) 10 MG tablet   atorvastatin  (LIPITOR) 40 MG tablet   dapagliflozin  propanediol (FARXIGA ) 10 MG TABS tablet   Dulaglutide  (TRULICITY ) 1.5 MG/0.5ML SOAJ   insulin  glargine (LANTUS ) 100 UNIT/ML injection   metFORMIN  (GLUCOPHAGE ) 1000 MG tablet     Essential hypertension       Relevant Medications   amLODipine  (NORVASC ) 10 MG tablet   atorvastatin  (LIPITOR) 40 MG tablet   carvedilol  (COREG ) 25 MG tablet   chlorthalidone  (HYGROTON ) 25 MG tablet   hydrALAZINE  (APRESOLINE ) 100 MG tablet   spironolactone  (ALDACTONE ) 50 MG tablet   Other Relevant Orders   CMP14+EGFR     Hyperlipidemia, unspecified hyperlipidemia type       Relevant Medications   amLODipine  (NORVASC ) 10 MG tablet   atorvastatin  (LIPITOR) 40 MG tablet   carvedilol  (COREG ) 25 MG tablet   chlorthalidone  (HYGROTON ) 25 MG tablet   hydrALAZINE  (APRESOLINE ) 100 MG tablet   spironolactone  (ALDACTONE ) 50 MG tablet     Seasonal allergies       Relevant Medications   cetirizine  (ZYRTEC  ALLERGY) 10 MG tablet     Long-term current use of injectable noninsulin antidiabetic medication       Relevant Medications   Dulaglutide  (TRULICITY ) 1.5 MG/0.5ML SOAJ     Long term (current) use of insulin  (HCC)       Relevant Medications   insulin   glargine (LANTUS ) 100 UNIT/ML injection     Return for primary care follow up.   30 minutes spent extra time needing assessing multiple problems and patient education Belvie Silvan, MD

## 2023-10-23 NOTE — Assessment & Plan Note (Signed)
 As for hip pain assessment

## 2023-10-24 ENCOUNTER — Ambulatory Visit: Admitting: Internal Medicine

## 2023-10-24 ENCOUNTER — Ambulatory Visit: Payer: Self-pay | Admitting: Critical Care Medicine

## 2023-10-24 LAB — CMP14+EGFR
ALT: 9 IU/L (ref 0–44)
AST: 14 IU/L (ref 0–40)
Albumin: 4.3 g/dL (ref 3.8–4.9)
Alkaline Phosphatase: 104 IU/L (ref 44–121)
BUN/Creatinine Ratio: 19 (ref 9–20)
BUN: 27 mg/dL — ABNORMAL HIGH (ref 6–24)
Bilirubin Total: 0.9 mg/dL (ref 0.0–1.2)
CO2: 21 mmol/L (ref 20–29)
Calcium: 10.2 mg/dL (ref 8.7–10.2)
Chloride: 102 mmol/L (ref 96–106)
Creatinine, Ser: 1.4 mg/dL — ABNORMAL HIGH (ref 0.76–1.27)
Globulin, Total: 3.1 g/dL (ref 1.5–4.5)
Glucose: 114 mg/dL — ABNORMAL HIGH (ref 70–99)
Potassium: 4.3 mmol/L (ref 3.5–5.2)
Sodium: 139 mmol/L (ref 134–144)
Total Protein: 7.4 g/dL (ref 6.0–8.5)
eGFR: 58 mL/min/1.73 — ABNORMAL LOW

## 2023-10-24 NOTE — Progress Notes (Signed)
 Let pt know all labs normal. Kidney function stable. Hga1c at goal. No change in medications

## 2023-10-27 ENCOUNTER — Telehealth: Payer: Self-pay

## 2023-10-27 ENCOUNTER — Other Ambulatory Visit: Payer: Self-pay

## 2023-10-27 NOTE — Telephone Encounter (Signed)
 Pharmacy Patient Advocate Encounter  Received notification from [facility] that Prior Authorization for TRULICITY  has been APPROVED from 10/27/2023 to 10/26/2024   PA #/Case ID/Reference #: [id number]

## 2023-11-13 ENCOUNTER — Ambulatory Visit: Admitting: Orthopedic Surgery

## 2023-11-13 ENCOUNTER — Other Ambulatory Visit: Payer: Self-pay

## 2023-11-13 ENCOUNTER — Other Ambulatory Visit (INDEPENDENT_AMBULATORY_CARE_PROVIDER_SITE_OTHER): Payer: Self-pay

## 2023-11-13 DIAGNOSIS — R2242 Localized swelling, mass and lump, left lower limb: Secondary | ICD-10-CM

## 2023-11-13 DIAGNOSIS — M25512 Pain in left shoulder: Secondary | ICD-10-CM

## 2023-11-13 DIAGNOSIS — M19012 Primary osteoarthritis, left shoulder: Secondary | ICD-10-CM | POA: Diagnosis not present

## 2023-11-13 DIAGNOSIS — G8929 Other chronic pain: Secondary | ICD-10-CM

## 2023-11-13 DIAGNOSIS — M25511 Pain in right shoulder: Secondary | ICD-10-CM

## 2023-11-13 DIAGNOSIS — M19011 Primary osteoarthritis, right shoulder: Secondary | ICD-10-CM | POA: Diagnosis not present

## 2023-11-14 ENCOUNTER — Encounter: Payer: Self-pay | Admitting: Orthopedic Surgery

## 2023-11-14 MED ORDER — BUPIVACAINE HCL 0.5 % IJ SOLN
9.0000 mL | INTRAMUSCULAR | Status: AC | PRN
  Administered 2023-11-13: 9 mL via INTRA_ARTICULAR

## 2023-11-14 MED ORDER — TRIAMCINOLONE ACETONIDE 40 MG/ML IJ SUSP
40.0000 mg | INTRAMUSCULAR | Status: AC | PRN
  Administered 2023-11-13: 40 mg via INTRA_ARTICULAR

## 2023-11-14 MED ORDER — LIDOCAINE HCL 1 % IJ SOLN
5.0000 mL | INTRAMUSCULAR | Status: AC | PRN
  Administered 2023-11-13: 5 mL

## 2023-11-14 NOTE — Progress Notes (Addendum)
 Office Visit Note   Patient: Justin Houston           Date of Birth: July 21, 1965           MRN: [id number] Visit Date: 11/13/2023 Requested by: Brien Belvie FORBES, MD [address] [place],  [place] [address] PCP: Vicci Barnie NOVAK, MD  Subjective: Chief Complaint  Patient presents with   Left Shoulder - Pain   Left Hip - Pain    HPI: Justin Houston is a 58 y.o. male who presents to the office reporting bilateral shoulder pain and left hip radiographic abnormality.  Patient reports long history of bilateral shoulder pain left worse than right.  The pain is chronic and worsening.  Reports decreased range of motion on that left-hand side.  He is right-hand dominant.  Patient works as a Runner, broadcasting/film/video.  Patient also reports some left hip pain for a couple of weeks.  Primarily posterior in the buttock region.  No radiation down the leg.  Not too much in terms of back pain.  Outside radiographs do show cystic opacity overlying the hip joint seen on both AP and lateral view which measures about 11 cm.  No calcification visible within that mass.  It is a discrete large oval structure..                ROS: All systems reviewed are negative as they relate to the chief complaint within the history of present illness.  Patient denies fevers or chills.  Assessment & Plan: Visit Diagnoses:  1. Left shoulder pain, unspecified chronicity   2. Chronic right shoulder pain   3. Hip mass, left     Plan: Impression is severe end-stage arthritis in both shoulders with significant limitation of motion worse on the left than the right.  He is actually doing surprisingly well considering the amount of arthritis present in his shoulders.  I think he is heading for shoulder replacement at sometime in the future.  For now we will try a glenohumeral joint injection left which may or may not be helpful based on the amount of glenohumeral arthritis he has.  He does need an MRI scan with contrast of his  pelvis to evaluate this left hip mass.  Follow-up 3 weeks to evaluate and come up with plan for this left hip mass  Follow-Up Instructions: No follow-ups on file.   Orders:  Orders Placed This Encounter  Procedures   XR Shoulder Left   XR Shoulder Right   US  Guided Needle Placement - No Linked Charges   MR PELVIS W WO CONTRAST   No orders of the defined types were placed in this encounter.     Procedures: Large Joint Inj: L glenohumeral on 11/13/2023 6:14 PM Indications: diagnostic evaluation and pain Details: 22 G 3.5 in needle, ultrasound-guided posterior approach  Arthrogram: No  Medications: 9 mL bupivacaine  0.5 %; 5 mL lidocaine  1 %; 40 mg triamcinolone acetonide 40 MG/ML Outcome: tolerated well, no immediate complications Procedure, treatment alternatives, risks and benefits explained, specific risks discussed. Consent was given by the patient. Immediately prior to procedure a time out was called to verify the correct patient, procedure, equipment, support staff and site/side marked as required. Patient was prepped and draped in the usual sterile fashion.       Clinical Data: No additional findings.  Objective: Vital Signs: There were no vitals taken for this visit.  Physical Exam:  Constitutional: Patient appears well-developed HEENT:  Head: Normocephalic Eyes:EOM  are normal Neck: Normal range of motion Cardiovascular: Normal rate Pulmonary/chest: Effort normal Neurologic: Patient is alert Skin: Skin is warm Psychiatric: Patient has normal mood and affect  Ortho Exam: Ortho exam demonstrates range of motion on the left of negative 10/50/80 with good rotator cuff strength to internal and external rotation.  Range of motion on the right is 15/80/100 with good rotator cuff strength to internal and external rotation.  Motor or sensory function to the hand is intact.  Radial pulses intact.  Deltoid fires bilaterally.  Left hip has no groin pain with  internal/external rotation of the leg.  Ankle dorsiflexion plantarflexion strength is intact.  No real masses are palpable in that left hip region but there is a fair amount of tissue around the hip.  Could be difficult to palpate for a discrete mass.  Specialty Comments:  No specialty comments available.  Imaging: No results found.   PMFS History: Patient Active Problem List   Diagnosis Date Noted   Chronic right shoulder pain 10/23/2023   History of colonic polyps    Benign neoplasm of transverse colon    Benign neoplasm of rectum    Left hip pain 10/03/2020   Varicose veins of both legs with edema 08/31/2018   GERD (gastroesophageal reflux disease) 08/16/2016   Diabetic retinopathy (HCC) 12/08/2014   Umbilical hernia without obstruction and without gangrene 09/29/2013   Glaucoma 05/26/2013   Gout 08/01/2012   BMI 45.0-49.9, adult (HCC) 08/01/2012   Hypertension 07/20/2011   Diabetes mellitus type 2 with retinopathy (HCC) 07/20/2011   Hyperlipidemia associated with type 2 diabetes mellitus (HCC) 07/20/2011   Type 2 diabetes mellitus with mild nonproliferative retinopathy without macular edema, with long-term current use of insulin  (HCC) 07/20/2011   Past Medical History:  Diagnosis Date   Dermatitis    Diabetes mellitus    GERD (gastroesophageal reflux disease)    Gout    Hyperlipidemia    Hypertension    Lipoma of scalp    Mass of head, right postauricular SQ 7x4cm 09/29/2013   Obesity    Toenail fungus 08/31/2018    Family History  Problem Relation Age of Onset   Stroke Mother    Hypertension Mother    Diabetes Maternal Grandmother    Prostate cancer Maternal Grandfather    Diabetes Other     Past Surgical History:  Procedure Laterality Date   COLONOSCOPY WITH PROPOFOL  N/A 12/01/2014   Procedure: COLONOSCOPY WITH PROPOFOL ;  Surgeon: Toribio SHAUNNA Cedar, MD;  Location: [facility];  Service: Endoscopy;  Laterality: N/A;   COLONOSCOPY WITH PROPOFOL  N/A 12/20/2021    Procedure: COLONOSCOPY WITH PROPOFOL ;  Surgeon: Aneita Gwendlyn DASEN, MD;  Location: [facility];  Service: Gastroenterology;  Laterality: N/A;   LIPOMA EXCISION N/A 01/28/2020   Procedure: EXCISION RIGHT SCALP LIPOMA;  Surgeon: Elisabeth Craig RAMAN, MD;  Location: [facility];  Service: Plastics;  Laterality: N/A;   POLYPECTOMY  12/20/2021   Procedure: POLYPECTOMY;  Surgeon: Aneita Gwendlyn DASEN, MD;  Location: [facility];  Service: Gastroenterology;;   right arm ORIF     Social History   Occupational History   Occupation: EC Geologist, engineering    Employer: [employer]  Tobacco Use   Smoking status: Former    Current packs/day: 0.00    Types: Cigarettes    Quit date: 09/05/1994    Years since quitting: 29.2   Smokeless tobacco: Never  Vaping Use   Vaping status: Never Used  Substance and Sexual Activity  Alcohol use: No    Alcohol/week: 0.0 standard drinks of alcohol   Drug use: No   Sexual activity: Never

## 2023-11-25 NOTE — Progress Notes (Signed)
 Patient presents today to pick up diabetic shoes and insoles.  Patient was dispensed 1 pair of diabetic shoes and 3 pairs of total contact diabetic insoles. Fit was satisfactory. Instructions for break-in and wear was reviewed and a copy was given to the patient.   Re-appointment for regularly scheduled diabetic foot care visits or if they should experience any trouble with the shoes or insoles.

## 2023-12-04 ENCOUNTER — Ambulatory Visit: Admitting: Orthopedic Surgery

## 2023-12-10 ENCOUNTER — Encounter: Payer: Self-pay | Admitting: Orthopedic Surgery

## 2023-12-14 ENCOUNTER — Ambulatory Visit
Admission: RE | Admit: 2023-12-14 | Discharge: 2023-12-14 | Disposition: A | Discharge status: Home / Self Care | Source: Ambulatory Visit | Attending: Orthopedic Surgery | Admitting: Orthopedic Surgery

## 2023-12-14 DIAGNOSIS — R2242 Localized swelling, mass and lump, left lower limb: Secondary | ICD-10-CM

## 2023-12-14 MED ORDER — GADOPICLENOL 0.5 MMOL/ML IV SOLN
10.0000 mL | Freq: Once | INTRAVENOUS | Status: AC | PRN
  Administered 2023-12-14: 10 mL via INTRAVENOUS

## 2023-12-22 ENCOUNTER — Other Ambulatory Visit: Payer: Self-pay | Admitting: Critical Care Medicine

## 2023-12-22 ENCOUNTER — Other Ambulatory Visit: Payer: Self-pay | Admitting: Physician Assistant

## 2023-12-22 DIAGNOSIS — Z794 Long term (current) use of insulin: Secondary | ICD-10-CM

## 2023-12-22 DIAGNOSIS — E1165 Type 2 diabetes mellitus with hyperglycemia: Secondary | ICD-10-CM

## 2023-12-24 ENCOUNTER — Ambulatory Visit (INDEPENDENT_AMBULATORY_CARE_PROVIDER_SITE_OTHER): Admitting: Orthopedic Surgery

## 2023-12-24 ENCOUNTER — Encounter: Payer: Self-pay | Admitting: Orthopedic Surgery

## 2023-12-24 DIAGNOSIS — M25552 Pain in left hip: Secondary | ICD-10-CM

## 2023-12-24 NOTE — Progress Notes (Signed)
 Office Visit Note   Patient: Justin Houston           Date of Birth: [birth year]           MRN: [id number] Visit Date: 12/24/2023 Requested by: Vicci Barnie NOVAK, MD [id number] [place] [address] [place],  [place] [address] PCP: Vicci Barnie NOVAK, MD  Subjective: Chief Complaint  Patient presents with   Other    Follow up to review MRI    HPI: Justin Houston is a 58 y.o. male who presents to the office reporting left hip mass and right shoulder pain.  Glenohumeral joint injection helped his right shoulder.  He is not really reporting much in the way of hernia type symptoms on the left-hand side.  Did have a rounded mass around his hip joint on the left-hand side on plain radiographs.  MRI scan is reviewed.  Patient has a moderate hernia on that left-hand side but no discrete mass..                ROS: All systems reviewed are negative as they relate to the chief complaint within the history of present illness.  Patient denies fevers or chills.  Assessment & Plan: Visit Diagnoses:  1. Left hip pain     Plan: Impression is improvement in right shoulder symptoms following injection MRI scan shows hernia.  Not particularly symptomatic but would potentially be worth an initial visit with a general surgeon for baseline evaluation.  Follow-up with us  as needed.  Follow-Up Instructions: No follow-ups on file.   Orders:  Orders Placed This Encounter  Procedures   Ambulatory referral to General Surgery   No orders of the defined types were placed in this encounter.     Procedures: No procedures performed   Clinical Data: No additional findings.  Objective: Vital Signs: There were no vitals taken for this visit.  Physical Exam:  Constitutional: Patient appears well-developed HEENT:  Head: Normocephalic Eyes:EOM are normal Neck: Normal range of motion Cardiovascular: Normal rate Pulmonary/chest: Effort normal Neurologic: Patient is alert Skin: Skin is warm Psychiatric:  Patient has normal mood and affect  Ortho Exam: Ortho exam demonstrates normal gait alignment.  No groin pain with internal/external Tatian of the leg.  Overlying pannus obscures any palpation of any type of masses in that right groin region.  No nerve root tension signs.  Right shoulder has improved range of motion and less pain with active and passive range of motion.  Specialty Comments:  No specialty comments available.  Imaging: No results found.   PMFS History: Patient Active Problem List   Diagnosis Date Noted   Chronic right shoulder pain 10/23/2023   History of colonic polyps    Benign neoplasm of transverse colon    Benign neoplasm of rectum    Left hip pain 10/03/2020   Varicose veins of both legs with edema 08/31/2018   GERD (gastroesophageal reflux disease) 08/16/2016   Diabetic retinopathy (HCC) 12/08/2014   Umbilical hernia without obstruction and without gangrene 09/29/2013   Glaucoma 05/26/2013   Gout 08/01/2012   BMI 45.0-49.9, adult (HCC) 08/01/2012   Hypertension 07/20/2011   Diabetes mellitus type 2 with retinopathy (HCC) 07/20/2011   Hyperlipidemia associated with type 2 diabetes mellitus (HCC) 07/20/2011   Type 2 diabetes mellitus with mild nonproliferative retinopathy without macular edema, with long-term current use of insulin  (HCC) 07/20/2011   Past Medical History:  Diagnosis Date   Dermatitis    Diabetes mellitus    GERD (  gastroesophageal reflux disease)    Gout    Hyperlipidemia    Hypertension    Lipoma of scalp    Mass of head, right postauricular SQ 7x4cm 09/29/2013   Obesity    Toenail fungus 08/31/2018    Family History  Problem Relation Age of Onset   Stroke Mother    Hypertension Mother    Diabetes Maternal Grandmother    Prostate cancer Maternal Grandfather    Diabetes Other     Past Surgical History:  Procedure Laterality Date   COLONOSCOPY WITH PROPOFOL  N/A 12/01/2014   Procedure: COLONOSCOPY WITH PROPOFOL ;  Surgeon: Toribio SHAUNNA Cedar, MD;  Location: [facility];  Service: Endoscopy;  Laterality: N/A;   COLONOSCOPY WITH PROPOFOL  N/A 12/20/2021   Procedure: COLONOSCOPY WITH PROPOFOL ;  Surgeon: Aneita Gwendlyn DASEN, MD;  Location: [facility];  Service: Gastroenterology;  Laterality: N/A;   LIPOMA EXCISION N/A 01/28/2020   Procedure: EXCISION RIGHT SCALP LIPOMA;  Surgeon: Elisabeth Craig RAMAN, MD;  Location: [facility];  Service: Plastics;  Laterality: N/A;   POLYPECTOMY  12/20/2021   Procedure: POLYPECTOMY;  Surgeon: Aneita Gwendlyn DASEN, MD;  Location: [facility];  Service: Gastroenterology;;   right arm ORIF     Social History   Occupational History   Occupation: EC Geologist, engineering    Employer: [employer]  Tobacco Use   Smoking status: Former    Current packs/day: 0.00    Types: Cigarettes    Quit date: 09/05/1994    Years since quitting: 29.3   Smokeless tobacco: Never  Vaping Use   Vaping status: Never Used  Substance and Sexual Activity   Alcohol use: No    Alcohol/week: 0.0 standard drinks of alcohol   Drug use: No   Sexual activity: Never

## 2024-01-01 ENCOUNTER — Telehealth: Payer: Self-pay | Admitting: Internal Medicine

## 2024-01-01 ENCOUNTER — Other Ambulatory Visit: Payer: Self-pay | Admitting: Physician Assistant

## 2024-01-01 DIAGNOSIS — I10 Essential (primary) hypertension: Secondary | ICD-10-CM

## 2024-01-01 NOTE — Telephone Encounter (Signed)
 Lvm to confrmed appt for 9/19

## 2024-01-02 ENCOUNTER — Ambulatory Visit: Attending: Internal Medicine | Admitting: Internal Medicine

## 2024-01-02 ENCOUNTER — Encounter: Payer: Self-pay | Admitting: Internal Medicine

## 2024-01-02 DIAGNOSIS — R079 Chest pain, unspecified: Secondary | ICD-10-CM

## 2024-01-02 DIAGNOSIS — Z7984 Long term (current) use of oral hypoglycemic drugs: Secondary | ICD-10-CM

## 2024-01-02 DIAGNOSIS — E1169 Type 2 diabetes mellitus with other specified complication: Secondary | ICD-10-CM | POA: Diagnosis not present

## 2024-01-02 DIAGNOSIS — I152 Hypertension secondary to endocrine disorders: Secondary | ICD-10-CM

## 2024-01-02 DIAGNOSIS — L0292 Furuncle, unspecified: Secondary | ICD-10-CM

## 2024-01-02 DIAGNOSIS — Z7985 Long-term (current) use of injectable non-insulin antidiabetic drugs: Secondary | ICD-10-CM

## 2024-01-02 DIAGNOSIS — Z23 Encounter for immunization: Secondary | ICD-10-CM

## 2024-01-02 DIAGNOSIS — E1159 Type 2 diabetes mellitus with other circulatory complications: Secondary | ICD-10-CM

## 2024-01-02 DIAGNOSIS — N289 Disorder of kidney and ureter, unspecified: Secondary | ICD-10-CM

## 2024-01-02 DIAGNOSIS — Z794 Long term (current) use of insulin: Secondary | ICD-10-CM | POA: Diagnosis not present

## 2024-01-02 DIAGNOSIS — E119 Type 2 diabetes mellitus without complications: Secondary | ICD-10-CM

## 2024-01-02 DIAGNOSIS — E785 Hyperlipidemia, unspecified: Secondary | ICD-10-CM

## 2024-01-02 MED ORDER — METFORMIN HCL 1000 MG PO TABS: 1000.0000 mg | ORAL_TABLET | Freq: Two times a day (BID) | ORAL | 3 refills | Status: AC

## 2024-01-02 MED ORDER — CARVEDILOL 25 MG PO TABS: 25.0000 mg | ORAL_TABLET | Freq: Two times a day (BID) | ORAL | 1 refills | Status: DC

## 2024-01-02 MED ORDER — INSULIN GLARGINE 100 UNIT/ML ~~LOC~~ SOLN: 40.0000 [IU] | Freq: Two times a day (BID) | SUBCUTANEOUS | 6 refills | Status: AC

## 2024-01-02 MED ORDER — SULFAMETHOXAZOLE-TRIMETHOPRIM 800-160 MG PO TABS: 1.0000 | ORAL_TABLET | Freq: Two times a day (BID) | ORAL | 0 refills | Status: DC

## 2024-01-02 MED ORDER — AMLODIPINE BESYLATE 10 MG PO TABS: 10.0000 mg | ORAL_TABLET | Freq: Every day | ORAL | 1 refills | Status: DC

## 2024-01-02 NOTE — Patient Instructions (Signed)
 VISIT SUMMARY:  Today, we reviewed your diabetes, hypertension, hyperlipidemia, and chronic kidney disease management. We also discussed your recent chest tightness and recurrent boils. Your diabetes and blood pressure are well-controlled, and you have made positive dietary changes. We have a plan to address each of your health concerns.  YOUR PLAN:  -EXERTIONAL CHEST PAIN, POSSIBLE ANGINA: You have been experiencing chest tightness when carrying your backpack, which could be a sign of angina, a condition where the heart doesn't get enough oxygen. We are referring you to a cardiologist for further evaluation.  -TYPE 2 DIABETES MELLITUS WITH HYPERGLYCEMIA: Your diabetes is well-controlled with an A1c of 6.0%. Continue your current medications: Trulicity  1.5 mg weekly, Farxiga  10 mg daily, Lantus  insulin  40 units twice a day, and Metformin  1000 mg twice a day. Keep up with your dietary changes and regular physical activity to maintain your A1c below 7.0%.  -ESSENTIAL HYPERTENSION: Your blood pressure is well-controlled with your current medications. Continue taking Spironolactone  50 mg daily, Hydralazine  100 mg three times a day, Chlorthalidone  25 mg daily, and Carvedilol  25 mg twice a day. Keep limiting your salt intake.  -HYPERLIPIDEMIA: Your cholesterol levels were previously high, but you have resumed taking Atorvastatin . Continue this medication and we will recheck your cholesterol levels after you stop taking Naprosyn  for one week.  -CHRONIC KIDNEY DISEASE, STAGE 2 (MILD): You have mild chronic kidney disease with a GFR of 58. Avoid NSAIDs like Naprosyn , ibuprofen , Aleve , and Advil . Use Tylenol  for pain instead and ensure you drink 4-8 glasses of water daily. We will recheck your kidney function after you stop taking Naprosyn  for one week.  -RECURRENT FURUNCLE OF RIGHT BUTTOCK: You had a boil on your right buttock that has drained but left some swelling. We will examine the area to see if you  need antibiotics.  INSTRUCTIONS:  1. Follow up with a cardiologist for your chest tightness. 2. Recheck cholesterol levels and kidney function after discontinuing Naprosyn  for one week. 3. Continue with your current medications and lifestyle changes as discussed.

## 2024-01-02 NOTE — Progress Notes (Signed)
 Patient ID: REDFORD BEHRLE, male    DOB: 01/26/1966  MRN: [id number]  CC: Diabetes (DM f/u. Med refills. /No questions / concerns/Yes to flu & pneumonia vax)   Subjective: Dquan Cortopassi is a 58 y.o. male who presents for chronic ds management. His concerns today include:  Patient with history of DM with retinopathy, HTN, HL, gout, GERD   Discussed the use of AI scribe software for clinical note transcription with the patient, who gave verbal consent to proceed.  History of Present Illness JAMIAH RECORE is a 58 year old male with diabetes, hypertension, and hyperlipidemia who presents for follow-up.  DM: Lab Results  Component Value Date   HGBA1C 6.0 10/23/2023  His diabetes management includes Trulicity  1.5 mg weekly, Farxiga  10 mg daily, Lantus  insulin  40 units twice a day, and Metformin  1000 mg twice a day. His A1c in July was 6.0, within the target range. He has made dietary changes, reducing red meat and increasing vegetables, fruits, [place], and chicken. Trulicity  helps decrease his appetite. He has lost five pounds since February. As a middle Engineer, site, he walks frequently at work, contributing to his weight loss.  HTN/CP: he confirms taking Spironolactone  50 mg daily, Hydralazine  100 mg three times a day, Chlorthalidone  25 mg daily, and Carvedilol  25 mg twice a day. He limits salt intake. No chest pain or shortness of breath except when carrying a heavy backpack, which he attributes to shoulder issues. He reports noticing chest tightness for the past couple of weeks when carrying his backpack  He has a history of recurrent boils, with the most recent one on the right inner buttock last week. It drained but left a knot. Previous boils occurred in the groin and underarm areas. The current boil is not painful.  HL: he is on Atorvastatin  and LDL cholesterol was 128 on last visit.  He was not taking the atorvastatin  consistently at the time.  He has since resumed consistent  use of the medication.  Renal Insuff His kidney function was checked in July, showing a GFR of 58. He was taking Naprosyn  twice a day. He reports adequate hydration, drinking 4-8 glasses of water daily.    Patient Active Problem List   Diagnosis Date Noted   Chronic right shoulder pain 10/23/2023   History of colonic polyps    Benign neoplasm of transverse colon    Benign neoplasm of rectum    Left hip pain 10/03/2020   Varicose veins of both legs with edema 08/31/2018   GERD (gastroesophageal reflux disease) 08/16/2016   Diabetic retinopathy (HCC) 12/08/2014   Umbilical hernia without obstruction and without gangrene 09/29/2013   Glaucoma 05/26/2013   Gout 08/01/2012   BMI 45.0-49.9, adult (HCC) 08/01/2012   Hypertension 07/20/2011   Diabetes mellitus type 2 with retinopathy (HCC) 07/20/2011   Hyperlipidemia associated with type 2 diabetes mellitus (HCC) 07/20/2011   Type 2 diabetes mellitus with mild nonproliferative retinopathy without macular edema, with long-term current use of insulin  (HCC) 07/20/2011     Current Outpatient Medications on File Prior to Visit  Medication Sig Dispense Refill   amLODipine  (NORVASC ) 10 MG tablet Take 1 tablet (10 mg total) by mouth daily. 30 tablet 3   atorvastatin  (LIPITOR) 40 MG tablet Take 1 tablet (40 mg total) by mouth daily. 90 tablet 3   Blood Glucose Monitoring Suppl (ONETOUCH VERIO) w/Device KIT Use as instructed to check blood sugar 3 times daily. E11.319 Z79.4 1 kit 0   Blood Pressure  Monitoring (BLOOD PRESSURE KIT) DEVI Use to measure blood pressure 1 each 0   brimonidine  (ALPHAGAN ) 0.2 % ophthalmic solution 1 drop 2 (two) times daily.     carvedilol  (COREG ) 25 MG tablet Take 1 tablet (25 mg total) by mouth 2 (two) times daily with a meal. 60 tablet 3   chlorthalidone  (HYGROTON ) 25 MG tablet TAKE 1 TABLET BY MOUTH DAILY. 90 tablet 2   clobetasol  ointment (TEMOVATE ) 0.05 % Apply 1 Application topically 3 (three) times daily as needed  (rash.). 60 g 1   dapagliflozin  propanediol (FARXIGA ) 10 MG TABS tablet Take 1 tablet (10 mg total) by mouth daily. 90 tablet 1   Dulaglutide  (TRULICITY ) 1.5 MG/0.5ML SOAJ Inject 1.5 mg into the skin once a week. 2 mL 3   fluticasone  (FLONASE ) 50 MCG/ACT nasal spray Place 2 sprays into both nostrils daily. 16 g 6   GLUCOSAMINE HCL PO Take 2,000 mg by mouth daily. With Vitamin D     glucose blood (ONETOUCH VERIO) test strip USE AS INSTRUCTED TO CHECK BLOOD SUGAR 3 TIMES DAILY 100 strip 11   hydrALAZINE  (APRESOLINE ) 100 MG tablet TAKE 1 TABLET BY MOUTH 3 TIMES DAILY 90 tablet 3   insulin  glargine (LANTUS ) 100 UNIT/ML injection Inject 0.4 mLs (40 Units total) into the skin 2 (two) times daily. 80 mL 0   Insulin  Syringe-Needle U-100 (BD INSULIN  SYRINGE U/F) 31G X 5/16 1 ML MISC Inject 1 each into the skin 2 (two) times daily. 100 each 3   latanoprost (XALATAN) 0.005 % ophthalmic solution Place 1 drop into both eyes at bedtime.     metFORMIN  (GLUCOPHAGE ) 1000 MG tablet Take 1 tablet (1,000 mg total) by mouth 2 (two) times daily with a meal. 180 tablet 0   naproxen  (NAPROSYN ) 500 MG tablet TAKE 1 TABLET BY MOUTH 2 TIMES DAILY 60 tablet 0   omeprazole  (PRILOSEC) 20 MG capsule Take 1 capsule (20 mg total) by mouth daily. 90 capsule 1   OneTouch Delica Lancets 33G MISC Use as instructed to check blood sugar 3 times daily. E11.319 Z79.4 100 each 11   spironolactone  (ALDACTONE ) 50 MG tablet Take 1 tablet (50 mg total) by mouth daily. 90 tablet 2   STIMULANT LAXATIVE 8.6-50 MG tablet TAKE 1 TABLET BY MOUTH DAILY 30 tablet 1   Baclofen  5 MG TABS Take 1 tablet (5 mg total) by mouth 2 (two) times daily as needed. (Patient not taking: Reported on 01/02/2024) 20 tablet 0   cetirizine  (ZYRTEC  ALLERGY) 10 MG tablet Take 1 tablet (10 mg total) by mouth daily. (Patient not taking: Reported on 01/02/2024) 30 tablet 11   No current facility-administered medications on file prior to visit.    Allergies  Allergen  Reactions   Losartan Potassium-Hctz Hives   Cozaar [Losartan Potassium] Hives    Hives     Social History   Socioeconomic History   Marital status: Single    Spouse name: n/a   Number of children: 0   Years of education: 18   Highest education level: Not on file  Occupational History   Occupation: EC Lobbyist: [employer]  Tobacco Use   Smoking status: Former    Current packs/day: 0.00    Types: Cigarettes    Quit date: 09/05/1994    Years since quitting: 29.3   Smokeless tobacco: Never  Vaping Use   Vaping status: Never Used  Substance and Sexual Activity   Alcohol use: No    Alcohol/week:  0.0 standard drinks of alcohol   Drug use: No   Sexual activity: Never  Other Topics Concern   Not on file  Social History Narrative   Master's Degree in Adult Education.  Lives alone.   No siblings.   Social Drivers of Corporate investment banker Strain: Low Risk  (10/23/2023)   Overall Financial Resource Strain (CARDIA)    Difficulty of Paying Living Expenses: Not hard at all  Food Insecurity: No Food Insecurity (10/23/2023)   Hunger Vital Sign    Worried About Running Out of Food in the Last Year: Never true    Ran Out of Food in the Last Year: Never true  Transportation Needs: No Transportation Needs (10/23/2023)   PRAPARE - Administrator, Civil Service (Medical): No    Lack of Transportation (Non-Medical): No  Physical Activity: Not on file  Stress: No Stress Concern Present (10/23/2023)   [employer] of Occupational Health - Occupational Stress Questionnaire    Feeling of Stress: Not at all  Social Connections: Moderately Integrated (10/23/2023)   Social Connection and Isolation Panel    Frequency of Communication with Friends and Family: Three times a week    Frequency of Social Gatherings with Friends and Family: Three times a week    Attends Religious Services: 1 to 4 times per year    Active Member of Clubs or  Organizations: Yes    Attends Engineer, structural: More than 4 times per year    Marital Status: Never married  Intimate Partner Violence: Not on file    Family History  Problem Relation Age of Onset   Stroke Mother    Hypertension Mother    Diabetes Maternal Grandmother    Prostate cancer Maternal Grandfather    Diabetes Other     Past Surgical History:  Procedure Laterality Date   COLONOSCOPY WITH PROPOFOL  N/A 12/01/2014   Procedure: COLONOSCOPY WITH PROPOFOL ;  Surgeon: Toribio SHAUNNA Cedar, MD;  Location: [facility];  Service: Endoscopy;  Laterality: N/A;   COLONOSCOPY WITH PROPOFOL  N/A 12/20/2021   Procedure: COLONOSCOPY WITH PROPOFOL ;  Surgeon: Aneita Gwendlyn DASEN, MD;  Location: [facility];  Service: Gastroenterology;  Laterality: N/A;   LIPOMA EXCISION N/A 01/28/2020   Procedure: EXCISION RIGHT SCALP LIPOMA;  Surgeon: Elisabeth Craig RAMAN, MD;  Location: [facility];  Service: Plastics;  Laterality: N/A;   POLYPECTOMY  12/20/2021   Procedure: POLYPECTOMY;  Surgeon: Aneita Gwendlyn DASEN, MD;  Location: [facility];  Service: Gastroenterology;;   right arm ORIF      ROS: Review of Systems Negative except as stated above  PHYSICAL EXAM: BP 122/73 (BP Location: Left Arm, Patient Position: Sitting, Cuff Size: Large)   Pulse 70   Temp 97.8 F (36.6 C) (Oral)   Ht 5' 11 (1.803 m)   Wt (!) 313 lb (142 kg)   SpO2 98%   BMI 43.65 kg/m   Wt Readings from Last 3 Encounters:  01/02/24 (!) 313 lb (142 kg)  10/23/23 (!) 310 lb 12.8 oz (141 kg)  05/19/23 (!) 318 lb (144.2 kg)    Physical Exam   General appearance - alert, well appearing, obese older AAM and in no distress Mental status - normal mood, behavior, speech, dress, motor activity, and thought processes Neck - supple, no significant adenopathy Chest - clear to auscultation, no wheezes, rales or rhonchi, symmetric air entry Heart - normal rate, regular rhythm, normal S1, S2, no murmurs, rubs, clicks or  gallops Extremities -  no LE edema. Moderate varicose veins both lower legs Skin: CMA Lonita present: Patient noted to have tiny nodular area RT buttock close to rectum. No drainage expressed and no erythema or fluctuance noted    Latest Ref Rng & Units 10/23/2023   10:01 AM 02/18/2023    5:01 PM 03/19/2022    4:03 PM  CMP  Glucose 70 - 99 mg/dL 885  867  86   BUN 6 - 24 mg/dL 27  24  24    Creatinine 0.76 - 1.27 mg/dL 8.59  8.71  8.78   Sodium 134 - 144 mmol/L 139  140  141   Potassium 3.5 - 5.2 mmol/L 4.3  4.2  4.2   Chloride 96 - 106 mmol/L 102  101  100   CO2 20 - 29 mmol/L 21   24   Calcium  8.7 - 10.2 mg/dL 89.7  9.4  9.9   Total Protein 6.0 - 8.5 g/dL 7.4  7.2    Total Bilirubin 0.0 - 1.2 mg/dL 0.9  0.8    Alkaline Phos 44 - 121 IU/L 104  114    AST 0 - 40 IU/L 14  16    ALT 0 - 44 IU/L 9      Lipid Panel     Component Value Date/Time   CHOL 191 05/19/2023 1025   TRIG 137 05/19/2023 1025   HDL 38 (L) 05/19/2023 1025   CHOLHDL 5.0 05/19/2023 1025   CHOLHDL 3.8 01/16/2016 1619   VLDL 23 01/16/2016 1619   LDLCALC 128 (H) 05/19/2023 1025    CBC    Component Value Date/Time   WBC 7.6 02/18/2023 1701   WBC 6.0 01/16/2016 1619   RBC 4.85 02/18/2023 1701   RBC 4.97 01/16/2016 1619   HGB 14.7 02/18/2023 1701   HCT 44.2 02/18/2023 1701   PLT 212 02/18/2023 1701   MCV 91 02/18/2023 1701   MCH 30.3 02/18/2023 1701   MCH 30.2 01/16/2016 1619   MCHC 33.3 02/18/2023 1701   MCHC 34.4 01/16/2016 1619   RDW 12.9 02/18/2023 1701   LYMPHSABS 1.8 02/18/2023 1701   MONOABS 420 01/16/2016 1619   EOSABS 0.1 02/18/2023 1701   BASOSABS 0.0 02/18/2023 1701    ASSESSMENT AND PLAN: 1. Type 2 diabetes mellitus with morbid obesity (HCC) (Primary) At goal. Commended him on dietary changes. Continue Trulicity  1.5 mg weekly, Farxiga  10 mg daily, Lantus  insulin  40 units twice a day, and Metformin  1000 mg twice a day. - amLODipine  (NORVASC ) 10 MG tablet; Take 1 tablet (10 mg total) by  mouth daily.  Dispense: 90 tablet; Refill: 1 - insulin  glargine (LANTUS ) 100 UNIT/ML injection; Inject 0.4 mLs (40 Units total) into the skin 2 (two) times daily.  Dispense: 80 mL; Refill: 6 - metFORMIN  (GLUCOPHAGE ) 1000 MG tablet; Take 1 tablet (1,000 mg total) by mouth 2 (two) times daily with a meal.  Dispense: 180 tablet; Refill: 3  2. Long-term (current) use of injectable non-insulin  antidiabetic drugs 3. Long term (current) use of insulin  (HCC) See # 1 above - insulin  glargine (LANTUS ) 100 UNIT/ML injection; Inject 0.4 mLs (40 Units total) into the skin 2 (two) times daily.  Dispense: 80 mL; Refill: 6  4. Diabetes mellitus treated with oral medication (HCC) See #1 above  5. Hyperlipidemia, unspecified hyperlipidemia type Continue atorvastatin  20 mg daily.  Recheck lipid profile to see whether LDL has reached goal since he has been taking the medicine consistently - Lipid panel; Future  6. Hypertension associated with  diabetes (HCC)  At goal. Continue Spironolactone  50 mg daily, Hydralazine  100 mg three times a day, Chlorthalidone  25 mg daily, and Carvedilol  25 mg twice a day.   7. Chest pain, unspecified type His chest pain episodes sound suspicious for angina.  He does have risk factors for heart disease.  Will refer to cardiology. - Ambulatory referral to Cardiology  8. Renal insufficiency Stop Naprosyn .  After being off this medicine for 1 week, he should return to the lab to have CMP repeated.  Okay to use Tylenol  instead. - Basic Metabolic Panel; Future  9. Recurrent boils Patient with recent boil of the right buttock close to the rectum.  It drained on its own and now appears resolved.  Gien hx of recurrent boils, I have sent a prescription to the pharmacy for Bactrim  for him to keep on hand and use the next time he has a developing boil.  10. Need for immunization against influenza - Flu vaccine trivalent PF, 6mos and older(Flulaval,Afluria,Fluarix,Fluzone)  11. Need for  Streptococcus pneumoniae vaccination - Pneumococcal conjugate vaccine 20-valent  Patient was given the opportunity to ask questions.  Patient verbalized understanding of the plan and was able to repeat key elements of the plan.   This documentation was completed using Paediatric nurse.  Any transcriptional errors are unintentional.  No orders of the defined types were placed in this encounter.    Requested Prescriptions   Pending Prescriptions Disp Refills   amLODipine  (NORVASC ) 10 MG tablet 90 tablet 1    Sig: Take 1 tablet (10 mg total) by mouth daily.   carvedilol  (COREG ) 25 MG tablet 180 tablet 1    Sig: Take 1 tablet (25 mg total) by mouth 2 (two) times daily with a meal.   insulin  glargine (LANTUS ) 100 UNIT/ML injection 80 mL 0    Sig: Inject 0.4 mLs (40 Units total) into the skin 2 (two) times daily.   metFORMIN  (GLUCOPHAGE ) 1000 MG tablet 180 tablet 0    Sig: Take 1 tablet (1,000 mg total) by mouth 2 (two) times daily with a meal.    No follow-ups on file.  Barnie Louder, MD, FACP

## 2024-01-09 ENCOUNTER — Ambulatory Visit: Attending: Internal Medicine

## 2024-01-09 ENCOUNTER — Other Ambulatory Visit

## 2024-01-09 DIAGNOSIS — E785 Hyperlipidemia, unspecified: Secondary | ICD-10-CM

## 2024-01-09 DIAGNOSIS — N289 Disorder of kidney and ureter, unspecified: Secondary | ICD-10-CM

## 2024-01-10 ENCOUNTER — Ambulatory Visit: Payer: Self-pay | Admitting: Internal Medicine

## 2024-01-10 LAB — BASIC METABOLIC PANEL WITH GFR
BUN/Creatinine Ratio: 21 — ABNORMAL HIGH (ref 9–20)
BUN: 27 mg/dL — ABNORMAL HIGH (ref 6–24)
CO2: 19 mmol/L — ABNORMAL LOW (ref 20–29)
Calcium: 9.6 mg/dL (ref 8.7–10.2)
Chloride: 102 mmol/L (ref 96–106)
Creatinine, Ser: 1.26 mg/dL (ref 0.76–1.27)
Glucose: 156 mg/dL — ABNORMAL HIGH (ref 70–99)
Potassium: 4.5 mmol/L (ref 3.5–5.2)
Sodium: 141 mmol/L (ref 134–144)
eGFR: 66 mL/min/1.73 (ref >59)

## 2024-01-10 LAB — LIPID PANEL
Chol/HDL Ratio: 5.5 ratio — ABNORMAL HIGH (ref 0.0–5.0)
Cholesterol, Total: 187 mg/dL (ref 100–199)
HDL: 34 mg/dL — ABNORMAL LOW (ref >39)
LDL Chol Calc (NIH): 131 mg/dL — ABNORMAL HIGH (ref 0–99)
Triglycerides: 121 mg/dL (ref 0–149)
VLDL Cholesterol Cal: 22 mg/dL (ref 5–40)

## 2024-01-12 ENCOUNTER — Telehealth: Payer: Self-pay | Admitting: Internal Medicine

## 2024-01-12 NOTE — Telephone Encounter (Signed)
 Reviewed with patient per pcp  LDL cholesterol level is still elevated at 131 with goal being less than 70.  If he has been taking the atorvastatin  20 mg consistently, then I recommend that we increase it to 40 mg daily.  Let me know his response. Kidney function improved back to baseline compared to when it was last checked in July.

## 2024-01-12 NOTE — Telephone Encounter (Signed)
 Copied from CRM #[id number]. Topic: Clinical - Medication Question >> Jan 12, 2024 11:05 AM Tiffini S wrote: Reason for RMF:[id number] asked if he should be taking the atorvastatin  20 mg or 40mg  at night.  Patient have not been consistently taking the medication. Wants to know if okay to take at night and the numbers for his Kidney function.   Please follow up with patient at [phone]- verified call back number as correct. Prefer at 4pm for the call back.

## 2024-01-15 ENCOUNTER — Encounter: Payer: Self-pay | Admitting: Podiatry

## 2024-01-15 ENCOUNTER — Ambulatory Visit: Admitting: Podiatry

## 2024-01-15 DIAGNOSIS — E1169 Type 2 diabetes mellitus with other specified complication: Secondary | ICD-10-CM | POA: Diagnosis not present

## 2024-01-15 DIAGNOSIS — B351 Tinea unguium: Secondary | ICD-10-CM

## 2024-01-15 DIAGNOSIS — E1151 Type 2 diabetes mellitus with diabetic peripheral angiopathy without gangrene: Secondary | ICD-10-CM

## 2024-01-15 DIAGNOSIS — L84 Corns and callosities: Secondary | ICD-10-CM | POA: Diagnosis not present

## 2024-01-15 NOTE — Progress Notes (Signed)
This patient returns to my office for at risk foot care.  This patient requires this care by a professional since this patient will be at risk due to having diabetes.  This patient is unable to cut nails himself since the patient cannot reach his nails.These nails are painful walking and wearing shoes.  He also has callus on the outside of his left foot.This patient presents for at risk foot care today.  General Appearance  Alert, conversant and in no acute stress.  Vascular  Dorsalis pedis and posterior tibial  pulses are palpable  bilaterally.  Capillary return is within normal limits  bilaterally. Temperature is within normal limits  bilaterally.  Neurologic  Senn-Weinstein monofilament wire test within normal limits  bilaterally. Muscle power within normal limits bilaterally.  Nails Thick disfigured discolored nails with subungual debris  from hallux to fifth toes bilaterally. No evidence of bacterial infection or drainage bilaterally.  Orthopedic  No limitations of motion  feet .  No crepitus or effusions noted.  No bony pathology or digital deformities noted.  Skin  normotropic skin with no porokeratosis noted bilaterally.  No signs of infections or ulcers noted.   Callus lateral aspect fifth metabase left foot.  Onychomycosis  Pain in right toes  Pain in left toes  Callus left foot.  Consent was obtained for treatment procedures.   Mechanical debridement of nails 1-5  bilaterally performed with a nail nipper.  Filed with dremel without incident. Debride callus with # 15 blade and dremel tool.   Return office visit     3  months                 Told patient to return for periodic foot care and evaluation due to potential at risk complications.   Sahej Hauswirth DPM   

## 2024-02-05 ENCOUNTER — Other Ambulatory Visit: Payer: Self-pay | Admitting: Surgery

## 2024-02-05 ENCOUNTER — Other Ambulatory Visit: Payer: Self-pay | Admitting: Critical Care Medicine

## 2024-02-05 DIAGNOSIS — E1165 Type 2 diabetes mellitus with hyperglycemia: Secondary | ICD-10-CM

## 2024-02-05 DIAGNOSIS — Z7985 Long-term (current) use of injectable non-insulin antidiabetic drugs: Secondary | ICD-10-CM

## 2024-02-06 NOTE — Telephone Encounter (Signed)
 Requested Prescriptions  Pending Prescriptions Disp Refills   Dulaglutide  (TRULICITY ) 1.5 MG/0.5ML SOAJ [Pharmacy Med Name: Trulicity  1.5 mg/0.5 mL subcutaneous pen injector] 2 mL 4    Sig: inject 1.5mg  into THE SKIN ONCE WEEKLY     Endocrinology:  Diabetes - GLP-1 Receptor Agonists Passed - 02/06/2024 10:28 PM      Passed - HBA1C is between 0 and 7.9 and within 180 days    HbA1c, POC (controlled diabetic range)  Date Value Ref Range Status  10/23/2023 6.0 0.0 - 7.0 % Final         Passed - Valid encounter within last 6 months    Recent Outpatient Visits           1 month ago Type 2 diabetes mellitus with morbid obesity (HCC)   [facility] - A Dept Of [place]. [facility] Vicci Barnie NOVAK, MD   3 months ago Left hip pain   [facility] - A Dept Of [place]. [facility] Brien Belvie BRAVO, MD   8 months ago Type 2 diabetes mellitus with morbid obesity [facility])   [facility] - A Dept Of [place]. [facility] Vicci Barnie B, MD   11 months ago Type 2 diabetes mellitus with hyperglycemia, with long-term current use of insulin  [facility])   [facility] - A Dept Of [place]. [facility] [place], Jon HERO, [place]   1 year ago Primary hypertension   [facility] - A Dept Of [place]. [facility] Brien Belvie BRAVO, MD       Future Appointments             In 2 weeks Lavona Agent, MD [facility] at [facility] A Dept of [facility] [place]. [facility] [employer], [facility]

## 2024-02-16 ENCOUNTER — Encounter: Payer: Self-pay | Admitting: Radiology

## 2024-02-25 DIAGNOSIS — E118 Type 2 diabetes mellitus with unspecified complications: Secondary | ICD-10-CM | POA: Insufficient documentation

## 2024-02-25 DIAGNOSIS — R072 Precordial pain: Secondary | ICD-10-CM | POA: Insufficient documentation

## 2024-02-25 NOTE — Progress Notes (Signed)
 Cardiology Office Note   Date:  02/26/2024   ID:  Justin Houston, DOB 1965/08/18, MRN [id number]  PCP:  Vicci Barnie NOVAK, MD  Cardiologist:   None Referring:  Vicci Barnie NOVAK, MD  No chief complaint on file.     History of Present Illness: Justin Houston is a 58 y.o. male who presents for evaluation of precordial chest pain.   He was referred by Vicci Barnie NOVAK, MD.  He has  not had prior coronary disease or cardiac history.  He has had no prior cardiac testing.  He does have significant risk factors.  He is referred for chest discomfort that started about a month ago.  This is mid discomfort.  It happens if he walks some distance.  It happened actually walking from the parking lot to this appointment.  It is a dull discomfort.  It might last for 10 minutes.  It can be 8 out of 10 in intensity.  He was never having this kind of discomfort before.  He does not have associated nausea or vomiting.  He might get a little diaphoretic.  Has not had any new shortness of breath.  He does have new EKG changes as described below with probable old anteroseptal infarct and T wave inversions.  He is not describing PND or orthopnea.  He has not had any palpitations, presyncope or syncope.  He lives by himself.  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  Past Medical History:  Diagnosis Date   Dermatitis    Diabetes mellitus    GERD (gastroesophageal reflux disease)    Gout    Hyperlipidemia    Hypertension    Lipoma of scalp    Mass of head, right postauricular SQ 7x4cm 09/29/2013   Obesity    Toenail fungus 08/31/2018    Past Surgical History:  Procedure Laterality Date   COLONOSCOPY WITH PROPOFOL  N/A 12/01/2014   Procedure: COLONOSCOPY WITH PROPOFOL ;  Surgeon: Toribio SHAUNNA Cedar, MD;  Location: [facility];  Service: Endoscopy;  Laterality: N/A;   COLONOSCOPY WITH PROPOFOL  N/A 12/20/2021   Procedure: COLONOSCOPY WITH PROPOFOL ;  Surgeon:  Aneita Gwendlyn DASEN, MD;  Location: [facility];  Service: Gastroenterology;  Laterality: N/A;   LIPOMA EXCISION N/A 01/28/2020   Procedure: EXCISION RIGHT SCALP LIPOMA;  Surgeon: Elisabeth Craig RAMAN, MD;  Location: [facility];  Service: Plastics;  Laterality: N/A;   POLYPECTOMY  12/20/2021   Procedure: POLYPECTOMY;  Surgeon: Aneita Gwendlyn DASEN, MD;  Location: [facility];  Service: Gastroenterology;;   right arm ORIF       Current Outpatient Medications  Medication Sig Dispense Refill   amLODipine  (NORVASC ) 10 MG tablet Take 1 tablet (10 mg total) by mouth daily. 90 tablet 1   aspirin EC 81 MG tablet Take 1 tablet (81 mg total) by mouth daily. Swallow whole.     atorvastatin  (LIPITOR) 40 MG tablet Take 1 tablet (40 mg total) by mouth daily. 90 tablet 3   Baclofen  5 MG TABS Take 1 tablet (5 mg total) by mouth 2 (two) times daily as needed. 20 tablet 0   Blood Glucose Monitoring Suppl (ONETOUCH VERIO) w/Device KIT Use as instructed to check blood sugar 3 times daily. E11.319 Z79.4 1 kit 0   Blood Pressure Monitoring (BLOOD PRESSURE KIT) DEVI Use to measure blood pressure 1 each 0   brimonidine  (ALPHAGAN ) 0.2 % ophthalmic solution 1 drop 2 (two) times daily.  carvedilol  (COREG ) 25 MG tablet Take 1 tablet (25 mg total) by mouth 2 (two) times daily with a meal. 180 tablet 1   cetirizine  (ZYRTEC  ALLERGY) 10 MG tablet Take 1 tablet (10 mg total) by mouth daily. 30 tablet 11   chlorthalidone  (HYGROTON ) 25 MG tablet TAKE 1 TABLET BY MOUTH DAILY. 90 tablet 2   clobetasol  ointment (TEMOVATE ) 0.05 % Apply 1 Application topically 3 (three) times daily as needed (rash.). 60 g 1   dapagliflozin  propanediol (FARXIGA ) 10 MG TABS tablet Take 1 tablet (10 mg total) by mouth daily. 90 tablet 1   Dulaglutide  (TRULICITY ) 1.5 MG/0.5ML SOAJ inject 1.5mg  into THE SKIN ONCE WEEKLY 2 mL 4   fluticasone  (FLONASE ) 50 MCG/ACT nasal spray Place 2 sprays into both nostrils daily. 16 g 6   GLUCOSAMINE HCL PO Take  2,000 mg by mouth daily. With Vitamin D     glucose blood (ONETOUCH VERIO) test strip USE AS INSTRUCTED TO CHECK BLOOD SUGAR 3 TIMES DAILY 100 strip 11   hydrALAZINE  (APRESOLINE ) 100 MG tablet TAKE 1 TABLET BY MOUTH 3 TIMES DAILY 90 tablet 3   insulin  glargine (LANTUS ) 100 UNIT/ML injection Inject 0.4 mLs (40 Units total) into the skin 2 (two) times daily. 80 mL 6   Insulin  Syringe-Needle U-100 (BD INSULIN  SYRINGE U/F) 31G X 5/16 1 ML MISC Inject 1 each into the skin 2 (two) times daily. 100 each 3   latanoprost (XALATAN) 0.005 % ophthalmic solution Place 1 drop into both eyes at bedtime.     metFORMIN  (GLUCOPHAGE ) 1000 MG tablet Take 1 tablet (1,000 mg total) by mouth 2 (two) times daily with a meal. 180 tablet 3   nitroGLYCERIN (NITROSTAT) 0.4 MG SL tablet Place 1 tablet (0.4 mg total) under the tongue every 5 (five) minutes as needed for chest pain. 25 tablet 10   omeprazole  (PRILOSEC) 20 MG capsule Take 1 capsule (20 mg total) by mouth daily. 90 capsule 1   OneTouch Delica Lancets 33G MISC Use as instructed to check blood sugar 3 times daily. E11.319 Z79.4 100 each 11   spironolactone  (ALDACTONE ) 50 MG tablet Take 1 tablet (50 mg total) by mouth daily. 90 tablet 2   STIMULANT LAXATIVE 8.6-50 MG tablet TAKE 1 TABLET BY MOUTH DAILY 30 tablet 1   sulfamethoxazole -trimethoprim  (BACTRIM  DS) 800-160 MG tablet Take 1 tablet by mouth 2 (two) times daily. 14 tablet 0   No current facility-administered medications for this visit.    Allergies:   Losartan potassium-hctz and Cozaar [losartan potassium]    Social History:  The patient  reports that he quit smoking about 29 years ago. His smoking use included cigarettes. He has never used smokeless tobacco. He reports that he does not drink alcohol and does not use drugs.   Family History:  The patient's family history includes Diabetes in his maternal grandmother and another family member; Hypertension in his mother; Prostate cancer in his maternal  grandfather; Stroke in his mother.    ROS:  Please see the history of present illness.   Otherwise, review of systems are positive for none.   All other systems are reviewed and negative.    PHYSICAL EXAM: VS:  BP (!) 149/82   Pulse 74   Ht 5' 11 (1.803 m)   Wt (!) 307 lb 6.4 oz (139.4 kg)   SpO2 97%   BMI 42.87 kg/m  , BMI Body mass index is 42.87 kg/m. GENERAL:  Well appearing HEENT:  Pupils equal round and reactive,  fundi not visualized, oral mucosa unremarkable NECK:  No jugular venous distention, waveform within normal limits, carotid upstroke brisk and symmetric, no bruits, no thyromegaly LYMPHATICS:  No cervical, inguinal adenopathy LUNGS:  Clear to auscultation bilaterally BACK:  No CVA tenderness CHEST:  Unremarkable HEART:  PMI not displaced or sustained,S1 and S2 within normal limits, no S3, no S4, no clicks, no rubs, soft apical systolic murmur, no diastolic  murmurs ABD:  Flat, positive bowel sounds normal in frequency in pitch, no bruits, no rebound, no guarding, no midline pulsatile mass, no hepatomegaly, no splenomegaly EXT:  2 plus pulses throughout, no edema, no cyanosis no clubbing SKIN:  No rashes no nodules NEURO:  Cranial nerves II through XII grossly intact, motor grossly intact throughout [facility]:  Cognitively intact, oriented to person place and time    EKG:  EKG Interpretation Date/Time:  Thursday February 26 2024 15:47:39 EST Ventricular Rate:  76 PR Interval:  140 QRS Duration:  90 QT Interval:  378 QTC Calculation: 425 R Axis:   -20  Text Interpretation: Normal sinus rhythm Moderate voltage criteria for LVH, may be normal variant ( R in aVL , Cornell product ) Anteroseptal infarct , age undetermined Marked ST abnormality, possible inferior subendocardial injury When compared with ECG of 26-Jan-2020 13:41, Anteroseptal infarct is now Present ST now depressed in Inferior leads ST now depressed in Lateral leads T wave inversion now evident in Inferior  leads T wave inversion now evident in Anterolateral leads Confirmed by Lavona Agent ([id number]) on 02/26/2024 4:10:47 PM     Recent Labs: 10/23/2023: ALT 9 01/09/2024: BUN 27; Creatinine, Ser 1.26; Potassium 4.5; Sodium 141    Lipid Panel    Component Value Date/Time   CHOL 187 01/09/2024 1637   TRIG 121 01/09/2024 1637   HDL 34 (L) 01/09/2024 1637   CHOLHDL 5.5 (H) 01/09/2024 1637   CHOLHDL 3.8 01/16/2016 1619   VLDL 23 01/16/2016 1619   LDLCALC 131 (H) 01/09/2024 1637      Wt Readings from Last 3 Encounters:  02/26/24 (!) 307 lb 6.4 oz (139.4 kg)  01/02/24 (!) 313 lb (142 kg)  10/23/23 (!) 310 lb 12.8 oz (141 kg)      Other studies Reviewed: Additional studies/ records that were reviewed today include: EKG. Review of the above records demonstrates:  Please see elsewhere in the note.     ASSESSMENT AND PLAN:   Precordial chest pain: His chest pain represents new onset exertional unstable angina.  He has risk factors and EKG changes.  The pretest probability of obstructive coronary disease is very high.  Cardiac cath is indicated. The patient understands that risks included but are not limited to stroke (1 in 1000), death (1 in 1000), kidney failure [usually temporary] (1 in 500), bleeding (1 in 200), allergic reaction [possibly serious] (1 in 200).  The patient understands and agrees to proceed.  Will start aspirin.  Will give him sublingual nitroglycerin and ER precautions.  DM: His A1c is actually controlled at 6.0.  He had diabetes since 1998.  This is managed by his primary provider.  HTN:   Blood pressure is mildly elevated and we will titrate meds based on future readings.  Dyslipidemia: His LDL is 131 but he was not taking his statin routinely when this was checked in September.  He is not taking it daily.  The goal should be an LDL at least in the 70s and probably lower pending the results above.  Current medicines are reviewed at  length with the patient today.  The  patient does not have concerns regarding medicines.  The following changes have been made:  no change  Labs/ tests ordered today include:   Orders Placed This Encounter  Procedures   Basic metabolic panel with GFR   CBC   EKG 12-Lead     Disposition:   FU with me after the cath.     Signed, Lynwood Schilling, MD  02/26/2024 5:01 PM    [facility]

## 2024-02-26 ENCOUNTER — Ambulatory Visit: Attending: Cardiology | Admitting: Cardiology

## 2024-02-26 ENCOUNTER — Encounter: Payer: Self-pay | Admitting: Cardiology

## 2024-02-26 VITALS — BP 149/82 | HR 74 | Ht 71.0 in | Wt 307.4 lb

## 2024-02-26 DIAGNOSIS — Z01812 Encounter for preprocedural laboratory examination: Secondary | ICD-10-CM

## 2024-02-26 DIAGNOSIS — E118 Type 2 diabetes mellitus with unspecified complications: Secondary | ICD-10-CM

## 2024-02-26 DIAGNOSIS — I10 Essential (primary) hypertension: Secondary | ICD-10-CM

## 2024-02-26 DIAGNOSIS — E785 Hyperlipidemia, unspecified: Secondary | ICD-10-CM

## 2024-02-26 DIAGNOSIS — R072 Precordial pain: Secondary | ICD-10-CM

## 2024-02-26 MED ORDER — ASPIRIN 81 MG PO TBEC
81.0000 mg | DELAYED_RELEASE_TABLET | Freq: Every day | ORAL | Status: AC
Start: 2024-02-26 — End: ?

## 2024-02-26 MED ORDER — NITROGLYCERIN 0.4 MG SL SUBL: 0.4000 mg | SUBLINGUAL_TABLET | SUBLINGUAL | 10 refills | Status: AC | PRN

## 2024-02-26 NOTE — Patient Instructions (Addendum)
 Medication Instructions:  Start Aspirin 81 mg once daily Start Nitroglycerin: Dissolve 1 tablet under the tongue every 5 minutes as needed for chest pain. Max of 3 doses, then 911.  *If you need a refill on your cardiac medications before your next appointment, please call your pharmacy*  Lab Work: BMET, CBC today at [facility] If you have labs (blood work) drawn today and your tests are completely normal, you will receive your results only by: MyChart Message (if you have MyChart) OR A paper copy in the mail If you have any lab test that is abnormal or we need to change your treatment, we will call you to review the results.  Testing/Procedures: Left Heart Cath  Follow-Up: At [facility], you and your health needs are our priority.  As part of our continuing mission to provide you with exceptional heart care, our providers are all part of one team.  This team includes your primary Cardiologist (physician) and Advanced Practice Providers or APPs (Physician Assistants and Nurse Practitioners) who all work together to provide you with the care you need, when you need it.  Your next appointment:   2 weeks after 11/18 (cath)  Provider:   Lavona, MD  We recommend signing up for the patient portal called MyChart.  Sign up information is provided on this After Visit Summary.  MyChart is used to connect with patients for Virtual Visits (Telemedicine).  Patients are able to view lab/test results, encounter notes, upcoming appointments, etc.  Non-urgent messages can be sent to your provider as well.   To learn more about what you can do with MyChart, go to [email or web address].   Other Instructions  [place] [facility] A DEPT OF [place]. [facility] AT [address] A DEPT OF THE [place]. [facility] [address] [place] [address] Dept: [phone] Loc: [phone]  METRO EDENFIELD  02/26/2024  You are scheduled for a Cardiac Catheterization on  Tuesday, November 18 with Dr. Newman Lawrence.  1. Please arrive at the [facility] (Main Entrance A) at [facility]: [address] [place], [place] [address] at 7:00 AM (This time is 2 hour(s) before your procedure to ensure your preparation).   Free valet parking service is available. You will check in at ADMITTING. The support person will be asked to wait in the waiting room.  It is OK to have someone drop you off and come back when you are ready to be discharged.    Special note: Every effort is made to have your procedure done on time. Please understand that emergencies sometimes delay scheduled procedures.  2. Diet: Nothing to eat after midnight.   3. Hydration: You need to be well hydrated before your procedure. On November 18, you may drink approved liquids (see below) until 2 hours before the procedure, with 16 oz of water as your last intake.   List of approved liquids water, clear juice, clear tea, black coffee, fruit juices, non-citric and without pulp, carbonated beverages, Gatorade, Kool -Aid, plain Jello-O and plain ice popsicles.  4. Labs: BMET, CBC today at [facility]  5. Medication instructions in preparation for your procedure:  HOLD spironolactone  day of procedure   Contrast Allergy: No  Take only 20 units of insulin  the night before your procedure. Do not take any insulin  on the day of the procedure.  Do not take Diabetes Med Glucophage  (Metformin ) on the day of the procedure and HOLD 48 HOURS AFTER THE PROCEDURE.  On the morning of your procedure, take your Aspirin 81 mg and any morning medicines NOT listed above.  You may use sips of water.  6. Plan to go home the same day, you will only stay overnight if medically necessary. 7. Bring a current list of your medications and current insurance cards. 8. You MUST have a responsible person to drive you home. 9. Someone MUST be with you the first 24 hours after you arrive home or your discharge will be  delayed. 10. Please wear clothes that are easy to get on and off and wear slip-on shoes.  Thank you for allowing us  to care for you!   -- [place]

## 2024-03-01 ENCOUNTER — Telehealth: Payer: Self-pay | Admitting: *Deleted

## 2024-03-01 ENCOUNTER — Telehealth: Payer: Self-pay | Admitting: Cardiology

## 2024-03-01 LAB — CBC

## 2024-03-01 NOTE — Telephone Encounter (Signed)
 Spoke with pt regarding medications. Pt was given instructions in chart from [employer]. Pt verbalized understanding. All questions if any were answered.

## 2024-03-01 NOTE — Telephone Encounter (Signed)
 Cardiac Catheterization scheduled at [facility] for: Tuesday March 02, 2024 9 AM Arrival time [facility] Main Entrance A at: 7 AM  Diet: -Nothing to eat after midnight.  Hydration: -May drink clear liquids until 2 hours before the procedure.  Approved liquids: Water, clear tea, black coffee, fruit juices-non-citric and without pulp,Gatorade, plain Jello/popsicles.   -Please drink 16 oz of water 2 hours before procedure.  Medication instructions: -Hold:  Insulin -AM of procedure-pt tells me he will take his evening Insulin  prior to procedure  Metformin -day of procedure and 48 hours post procedure  Farxiga -AM of procedure  Trulicity  weekly-will not take today or tomorrow  Spironolactone /Chlorthalidone -AM of procedure -Other usual morning medications can be taken including aspirin 81 mg.  Plan to go home the same day, you will only stay overnight if medically necessary.  You must have responsible adult to drive you home.  Someone must be with you the first 24 hours after you arrive home.  Reviewed procedure instructions with patient.  Patient tells me he did not have lab last week because appt did not end until after 5 PM and [facility] was closed, he plans to go to [facility] in [place] this morning.

## 2024-03-01 NOTE — Telephone Encounter (Signed)
 Pt c/o medication issue:  1. Name of Medication:   carvedilol  (COREG ) 25 MG tablet  hydrALAZINE  (APRESOLINE ) 100 MG tablet  atorvastatin  (LIPITOR) 40 MG tablet  dapagliflozin  propanediol (FARXIGA ) 10 MG TABS tablet   2. How are you currently taking this medication (dosage and times per day)?   3. Are you having a reaction (difficulty breathing--STAT)?   4. What is your medication issue?   Patient stated he has a procedure tomorrow (11/18) and wants to know if he should still take these medication tonight and tomorrow.

## 2024-03-02 ENCOUNTER — Ambulatory Visit (HOSPITAL_COMMUNITY)
Admission: RE | Admit: 2024-03-02 | Discharge: 2024-03-02 | Disposition: A | Discharge status: Home / Self Care | Attending: Cardiology | Admitting: Cardiology

## 2024-03-02 ENCOUNTER — Other Ambulatory Visit: Payer: Self-pay

## 2024-03-02 ENCOUNTER — Encounter (HOSPITAL_COMMUNITY)
Admission: RE | Disposition: A | Discharge status: Home / Self Care | Payer: Self-pay | Source: Home / Self Care | Attending: Cardiology

## 2024-03-02 ENCOUNTER — Other Ambulatory Visit: Payer: Self-pay | Admitting: Cardiology

## 2024-03-02 DIAGNOSIS — Z79899 Other long term (current) drug therapy: Secondary | ICD-10-CM | POA: Insufficient documentation

## 2024-03-02 DIAGNOSIS — I2584 Coronary atherosclerosis due to calcified coronary lesion: Secondary | ICD-10-CM | POA: Insufficient documentation

## 2024-03-02 DIAGNOSIS — E785 Hyperlipidemia, unspecified: Secondary | ICD-10-CM | POA: Insufficient documentation

## 2024-03-02 DIAGNOSIS — I10 Essential (primary) hypertension: Secondary | ICD-10-CM | POA: Diagnosis not present

## 2024-03-02 DIAGNOSIS — Z87891 Personal history of nicotine dependence: Secondary | ICD-10-CM | POA: Diagnosis not present

## 2024-03-02 DIAGNOSIS — E119 Type 2 diabetes mellitus without complications: Secondary | ICD-10-CM | POA: Insufficient documentation

## 2024-03-02 DIAGNOSIS — I25118 Atherosclerotic heart disease of native coronary artery with other forms of angina pectoris: Secondary | ICD-10-CM | POA: Insufficient documentation

## 2024-03-02 DIAGNOSIS — Z7982 Long term (current) use of aspirin: Secondary | ICD-10-CM | POA: Diagnosis not present

## 2024-03-02 DIAGNOSIS — R072 Precordial pain: Secondary | ICD-10-CM

## 2024-03-02 HISTORY — PX: LEFT HEART CATH AND CORONARY ANGIOGRAPHY: CATH118249

## 2024-03-02 LAB — CBC
Hematocrit: 43.6 % (ref 37.5–51.0)
Hemoglobin: 14.5 g/dL (ref 13.0–17.7)
MCH: 30.9 pg (ref 26.6–33.0)
MCHC: 33.3 g/dL (ref 31.5–35.7)
MCV: 93 fL (ref 79–97)
Platelets: 221 x10E3/uL (ref 150–450)
RBC: 4.69 x10E6/uL (ref 4.14–5.80)
RDW: 12.7 % (ref 11.6–15.4)
WBC: 6.2 x10E3/uL (ref 3.4–10.8)

## 2024-03-02 LAB — BASIC METABOLIC PANEL WITH GFR
BUN/Creatinine Ratio: 18 (ref 9–20)
BUN: 18 mg/dL (ref 6–24)
CO2: 24 mmol/L (ref 20–29)
Calcium: 9.7 mg/dL (ref 8.7–10.2)
Chloride: 100 mmol/L (ref 96–106)
Creatinine, Ser: 1 mg/dL (ref 0.76–1.27)
Glucose: 85 mg/dL (ref 70–99)
Potassium: 4.3 mmol/L (ref 3.5–5.2)
Sodium: 139 mmol/L (ref 134–144)
eGFR: 87 mL/min/1.73 (ref >59)

## 2024-03-02 LAB — GLUCOSE, CAPILLARY: Glucose-Capillary: 110 mg/dL — ABNORMAL HIGH (ref 70–99)

## 2024-03-02 LAB — POCT ACTIVATED CLOTTING TIME: Activated Clotting Time: 199 s

## 2024-03-02 SURGERY — LEFT HEART CATH AND CORONARY ANGIOGRAPHY
Anesthesia: LOCAL

## 2024-03-02 MED ORDER — LIDOCAINE HCL (PF) 1 % IJ SOLN
INTRAMUSCULAR | Status: AC
  Filled 2024-03-02: qty 30

## 2024-03-02 MED ORDER — VERAPAMIL HCL 2.5 MG/ML IV SOLN
INTRAVENOUS | Status: AC
  Filled 2024-03-02: qty 2

## 2024-03-02 MED ORDER — SODIUM CHLORIDE 0.9% FLUSH: 3.0000 mL | Freq: Two times a day (BID) | INTRAVENOUS | Status: DC

## 2024-03-02 MED ORDER — HEPARIN SODIUM (PORCINE) 1000 UNIT/ML IJ SOLN
INTRAMUSCULAR | Status: DC | PRN
  Administered 2024-03-02: 5000 [IU] via INTRAVENOUS
  Administered 2024-03-02 (×2): 2000 [IU] via INTRAVENOUS

## 2024-03-02 MED ORDER — HEPARIN (PORCINE) IN NACL 2-0.9 UNITS/ML
INTRAMUSCULAR | Status: DC | PRN
  Administered 2024-03-02: 10 mL via INTRA_ARTERIAL

## 2024-03-02 MED ORDER — MIDAZOLAM HCL (PF) 2 MG/2ML IJ SOLN
INTRAMUSCULAR | Status: DC | PRN
  Administered 2024-03-02: 1 mg via INTRAVENOUS

## 2024-03-02 MED ORDER — HEPARIN SODIUM (PORCINE) 1000 UNIT/ML IJ SOLN
INTRAMUSCULAR | Status: AC
  Filled 2024-03-02: qty 10

## 2024-03-02 MED ORDER — SODIUM CHLORIDE 0.9% FLUSH: 3.0000 mL | INTRAVENOUS | Status: DC | PRN

## 2024-03-02 MED ORDER — IOHEXOL 350 MG/ML SOLN
INTRAVENOUS | Status: DC | PRN
Start: 2024-03-02 — End: 2024-03-02
  Administered 2024-03-02: 120 mL

## 2024-03-02 MED ORDER — FENTANYL CITRATE (PF) 100 MCG/2ML IJ SOLN
INTRAMUSCULAR | Status: DC | PRN
  Administered 2024-03-02: 25 ug via INTRAVENOUS

## 2024-03-02 MED ORDER — SODIUM CHLORIDE 0.9 % IV SOLN: 250.0000 mL | INTRAVENOUS | Status: DC | PRN

## 2024-03-02 MED ORDER — FREE WATER: 500.0000 mL | Freq: Once | Status: DC

## 2024-03-02 MED ORDER — ASPIRIN 81 MG PO CHEW: 81.0000 mg | CHEWABLE_TABLET | ORAL | Status: DC

## 2024-03-02 MED ORDER — LIDOCAINE HCL (PF) 1 % IJ SOLN
INTRAMUSCULAR | Status: DC | PRN
Start: 2024-03-02 — End: 2024-03-02
  Administered 2024-03-02: 2 mL via INTRADERMAL

## 2024-03-02 MED ORDER — HEPARIN (PORCINE) IN NACL 1000-0.9 UT/500ML-% IV SOLN
INTRAVENOUS | Status: DC | PRN
  Administered 2024-03-02: 1000 mL

## 2024-03-02 MED ORDER — MIDAZOLAM HCL 2 MG/2ML IJ SOLN
INTRAMUSCULAR | Status: AC
  Filled 2024-03-02: qty 2

## 2024-03-02 MED ORDER — FENTANYL CITRATE (PF) 100 MCG/2ML IJ SOLN
INTRAMUSCULAR | Status: AC
  Filled 2024-03-02: qty 2

## 2024-03-02 SURGICAL SUPPLY — 16 items
CATH INFINITI 5F JL4 125CM (CATHETERS) IMPLANT
CATH INFINITI 5FR AL1 (CATHETERS) IMPLANT
CATH INFINITI 5FR JR4 125CM (CATHETERS) IMPLANT
CATH INFINITI AMBI 5FR TG (CATHETERS) IMPLANT
CATH INFINITI JR4 5F (CATHETERS) IMPLANT
CATH LAUNCHER 5F EBU3.5 (CATHETERS) IMPLANT
CATH LAUNCHER 6FR EBU 3 (CATHETERS) IMPLANT
DEVICE RAD COMP TR BAND LRG (VASCULAR PRODUCTS) IMPLANT
GLIDESHEATH SLEND A-KIT 6F 22G (SHEATH) IMPLANT
GUIDEWIRE INQWIRE 1.5J.035X260 (WIRE) IMPLANT
GUIDEWIRE SAFE TJ AMPLATZ EXST (WIRE) IMPLANT
GUIDEWIRE TIGER .035X300 (WIRE) IMPLANT
PACK CARDIAC CATHETERIZATION (CUSTOM PROCEDURE TRAY) ×1 IMPLANT
SET ATX-X65L (MISCELLANEOUS) IMPLANT
SHEATH CATAPULT 6F 90 MP (SHEATH) IMPLANT
SHEATH PINNACLE 6F 10CM (SHEATH) IMPLANT

## 2024-03-02 NOTE — Interval H&P Note (Signed)
 History and Physical Interval Note:  03/02/2024 8:24 AM  Justin Houston  has presented today for surgery, with the diagnosis of unstable angina.  The various methods of treatment have been discussed with the patient and family. After consideration of risks, benefits and other options for treatment, the patient has consented to  Procedure(s): LEFT HEART CATH AND CORONARY ANGIOGRAPHY (N/A) as a surgical intervention.  The patient's history has been reviewed, patient examined, no change in status, stable for surgery.  I have reviewed the patient's chart and labs.  Questions were answered to the patient's satisfaction.     Ysela Hettinger J Genessis Flanary

## 2024-03-02 NOTE — Interval H&P Note (Signed)
 History and Physical Interval Note:  03/02/2024 8:19 AM  Justin Houston  has presented today for surgery, with the diagnosis of unstable angina.  The various methods of treatment have been discussed with the patient and family. After consideration of risks, benefits and other options for treatment, the patient has consented to  Procedure(s): LEFT HEART CATH AND CORONARY ANGIOGRAPHY (N/A) as a surgical intervention.  The patient's history has been reviewed, patient examined, no change in status, stable for surgery.  I have reviewed the patient's chart and labs.  Questions were answered to the patient's satisfaction.     Katti Pelle J Irena Gaydos

## 2024-03-02 NOTE — Progress Notes (Signed)
 Ordering echocardiogram to be performed at [facility]. Results to Dr. Lavona

## 2024-03-02 NOTE — H&P (Signed)
 Cardiology Office Note   Date:  03/02/2024   ID:  Justin Houston, DOB 05-Nov-1965, MRN [id number]  PCP:  Justin Barnie NOVAK, MD  Cardiologist:   None Referring:  Justin Barnie NOVAK, MD  C/C: Chest pain    History of Present Illness: Justin Houston is a 58 y.o. male who presents for evaluation of precordial chest pain.   He was referred by Justin Barnie NOVAK, MD.  He has  not had prior coronary disease or cardiac history.  He has had no prior cardiac testing.  He does have significant risk factors.  He is referred for chest discomfort that started about a month ago.  This is mid discomfort.  It happens if he walks some distance.  It happened actually walking from the parking lot to this appointment.  It is a dull discomfort.  It might last for 10 minutes.  It can be 8 out of 10 in intensity.  He was never having this kind of discomfort before.  He does not have associated nausea or vomiting.  He might get a little diaphoretic.  Has not had any new shortness of breath.  He does have new EKG changes as described below with probable old anteroseptal infarct and T wave inversions.  He is not describing PND or orthopnea.  He has not had any palpitations, presyncope or syncope.  He lives by himself.  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  Past Medical History:  Diagnosis Date   Dermatitis    Diabetes mellitus    GERD (gastroesophageal reflux disease)    Gout    Hyperlipidemia    Hypertension    Lipoma of scalp    Mass of head, right postauricular SQ 7x4cm 09/29/2013   Obesity    Toenail fungus 08/31/2018    Past Surgical History:  Procedure Laterality Date   COLONOSCOPY WITH PROPOFOL  N/A 12/01/2014   Procedure: COLONOSCOPY WITH PROPOFOL ;  Surgeon: Toribio SHAUNNA Cedar, MD;  Location: [facility];  Service: Endoscopy;  Laterality: N/A;   COLONOSCOPY WITH PROPOFOL  N/A 12/20/2021   Procedure: COLONOSCOPY WITH PROPOFOL ;  Surgeon: Aneita Gwendlyn DASEN, MD;  Location: [facility];  Service: Gastroenterology;  Laterality: N/A;   LIPOMA EXCISION N/A 01/28/2020   Procedure: EXCISION RIGHT SCALP LIPOMA;  Surgeon: Elisabeth Craig RAMAN, MD;  Location: [facility];  Service: Plastics;  Laterality: N/A;   POLYPECTOMY  12/20/2021   Procedure: POLYPECTOMY;  Surgeon: Aneita Gwendlyn DASEN, MD;  Location: [facility];  Service: Gastroenterology;;   right arm ORIF       Current Facility-Administered Medications  Medication Dose Route Frequency Provider Last Rate Last Admin   0.9 %  sodium chloride  infusion  250 mL Intravenous PRN Hochrein, James, MD       aspirin chewable tablet 81 mg  81 mg Oral Pre-Cath Hochrein, James, MD       free water 500 mL  500 mL Oral Once Hochrein, James, MD       sodium chloride  flush (NS) 0.9 % injection 3 mL  3 mL Intravenous Q12H Lavona Agent, MD       sodium chloride  flush (NS) 0.9 % injection 3 mL  3 mL Intravenous PRN Hochrein, James, MD        Allergies:   Losartan potassium-hctz and Cozaar [losartan potassium]    Social History:  The patient  reports that he quit smoking about 29 years ago. His smoking use included  cigarettes. He has never used smokeless tobacco. He reports that he does not drink alcohol and does not use drugs.   Family History:  The patient's family history includes Diabetes in his maternal grandmother and another family member; Hypertension in his mother; Prostate cancer in his maternal grandfather; Stroke in his mother.    ROS:  Please see the history of present illness.   Otherwise, review of systems are positive for none.   All other systems are reviewed and negative.    PHYSICAL EXAM: VS:  BP 133/70   Pulse 62   Temp 98.1 F (36.7 C) (Oral)   Resp 19   Ht 5' 11 (1.803 m)   Wt (!) 140.6 kg   SpO2 100%   BMI 43.24 kg/m  , BMI Body mass index is 43.24 kg/m. GENERAL:  Well appearing HEENT:  Pupils equal round and reactive, fundi not visualized, oral mucosa  unremarkable NECK:  No jugular venous distention, waveform within normal limits, carotid upstroke brisk and symmetric, no bruits, no thyromegaly LYMPHATICS:  No cervical, inguinal adenopathy LUNGS:  Clear to auscultation bilaterally BACK:  No CVA tenderness CHEST:  Unremarkable HEART:  PMI not displaced or sustained,S1 and S2 within normal limits, no S3, no S4, no clicks, no rubs, soft apical systolic murmur, no diastolic  murmurs ABD:  Flat, positive bowel sounds normal in frequency in pitch, no bruits, no rebound, no guarding, no midline pulsatile mass, no hepatomegaly, no splenomegaly EXT:  2 plus pulses throughout, no edema, no cyanosis no clubbing SKIN:  No rashes no nodules NEURO:  Cranial nerves II through XII grossly intact, motor grossly intact throughout PSYCH:  Cognitively intact, oriented to person place and time    EKG:        Recent Labs: 10/23/2023: ALT 9 03/01/2024: BUN 18; Creatinine, Ser 1.00; Hemoglobin 14.5; Platelets 221; Potassium 4.3; Sodium 139    Lipid Panel    Component Value Date/Time   CHOL 187 01/09/2024 1637   TRIG 121 01/09/2024 1637   HDL 34 (L) 01/09/2024 1637   CHOLHDL 5.5 (H) 01/09/2024 1637   CHOLHDL 3.8 01/16/2016 1619   VLDL 23 01/16/2016 1619   LDLCALC 131 (H) 01/09/2024 1637      Wt Readings from Last 3 Encounters:  03/02/24 (!) 140.6 kg  02/26/24 (!) 139.4 kg  01/02/24 (!) 142 kg      Other studies Reviewed: Additional studies/ records that were reviewed today include: EKG. Review of the above records demonstrates:  Please see elsewhere in the note.     ASSESSMENT AND PLAN:   Precordial chest pain: His chest pain represents new onset exertional unstable angina.  He has risk factors and EKG changes.  The pretest probability of obstructive coronary disease is very high.  Cardiac cath is indicated. The patient understands that risks included but are not limited to stroke (1 in 1000), death (1 in 1000), kidney failure [usually  temporary] (1 in 500), bleeding (1 in 200), allergic reaction [possibly serious] (1 in 200).  The patient understands and agrees to proceed.  Will start aspirin.  Will give him sublingual nitroglycerin and ER precautions.  DM: His A1c is actually controlled at 6.0.  He had diabetes since 1998.  This is managed by his primary provider.  HTN:   Blood pressure is mildly elevated and we will titrate meds based on future readings.  Dyslipidemia: His LDL is 131 but he was not taking his statin routinely when this was checked in September.  He  is not taking it daily.  The goal should be an LDL at least in the 70s and probably lower pending the results above.  Current medicines are reviewed at length with the patient today.  The patient does not have concerns regarding medicines.  The following changes have been made:  no change  Labs/ tests ordered today include:   Orders Placed This Encounter  Procedures   Glucose, capillary   Informed Consent Details: Physician/Practitioner Attestation; Transcribe to consent form and obtain patient signature   Apply Cardiac or Vascular Catheterization and/or Intervention Care Plan   Confirm CBC and BMP (or CMP) results within 7 days for inpatient and 30 days for outpatient: Outpatients with severe anemia (hgb<10, CKD, severe thrombocytopenia plts<100) labs should be within 10 days. Only draw PT/INR on patients that are on Coumadin, Hgb<10, have liver disease (cirrhosis, liver CA, hepatitis, etc). Urine pregnancy test within hospital admission for inpatients of child bearing age, for outpatients day of procedure.   Confirm EKG performed within 30 days for cardiac procedures and 12 months for peripheral vascular procedures.  Place order for EKG if missing or not within timeframe.   Verify aspirin and / or anti-platelet medication (Plavix, Effient, Brilinta) dose available for cardiac / peripheral vascular procedure day. IF ordered daily / once, adjust schedule to  administer before procedure.   Weigh patient   Initiate Cath/PCI clinical path; encourage patient to watch CCTV video   Insert peripheral IV   Insert 2nd peripheral IV site-Saline lock IV     Disposition:   FU with me after the cath.     Signed, Newman JINNY Lawrence, MD  03/02/2024 8:18 AM    [facility]

## 2024-03-02 NOTE — Discharge Instructions (Signed)

## 2024-03-03 ENCOUNTER — Ambulatory Visit (HOSPITAL_COMMUNITY)
Admission: RE | Admit: 2024-03-03 | Discharge: 2024-03-03 | Disposition: A | Discharge status: Home / Self Care | Source: Ambulatory Visit | Attending: Cardiology | Admitting: Cardiology

## 2024-03-03 ENCOUNTER — Encounter (HOSPITAL_COMMUNITY): Payer: Self-pay | Admitting: Cardiology

## 2024-03-03 DIAGNOSIS — R072 Precordial pain: Secondary | ICD-10-CM | POA: Insufficient documentation

## 2024-03-03 DIAGNOSIS — I119 Hypertensive heart disease without heart failure: Secondary | ICD-10-CM | POA: Insufficient documentation

## 2024-03-03 LAB — ECHOCARDIOGRAM COMPLETE
Area-P 1/2: 1.82 cm2
Calc EF: 47.9 %
Est EF: 50
S' Lateral: 3 cm
Single Plane A2C EF: 49.3 %
Single Plane A4C EF: 46.4 %

## 2024-03-03 NOTE — Progress Notes (Signed)
  Echocardiogram 2D Echocardiogram has been performed.  Koleen KANDICE Popper, RDCS 03/03/2024, 10:53 AM

## 2024-03-04 ENCOUNTER — Telehealth: Payer: Self-pay | Admitting: Cardiology

## 2024-03-04 ENCOUNTER — Ambulatory Visit: Payer: Self-pay | Admitting: Cardiology

## 2024-03-04 NOTE — Telephone Encounter (Signed)
 Patient called to follow-up on getting Cath appointment.

## 2024-03-04 NOTE — Telephone Encounter (Signed)
 Returned patient's call, 2 verifiers used to establish identity. Patient reports he received a message at 9 AM today to call our office to set up an appointment but no other information was given. No notation of phone encounter from this morning in chart. Fowarded to scheduling as patient recently had cath and may need f /u appt.

## 2024-03-05 ENCOUNTER — Ambulatory Visit: Payer: Self-pay

## 2024-03-05 ENCOUNTER — Telehealth: Payer: Self-pay | Admitting: Cardiology

## 2024-03-05 DIAGNOSIS — I2510 Atherosclerotic heart disease of native coronary artery without angina pectoris: Secondary | ICD-10-CM

## 2024-03-05 NOTE — Heart Team MDD (Signed)
   Heart Team Multi-Disciplinary Discussion  Patient: Justin Houston  DOB: 02-15-1966  MRN: [id number]   Date: 03/05/2024  12:25 PM    Attendees: Interventional Cardiology: Lurena Red, MD Lonni Hanson, MD Alm Clay, MD Lonni Cash, MD Cara Lovelace, MD Gordy Bergamo, MD Newman Lawrence, MD Deatrice Cage, MD  Cardiothoracic Surgery: Dorise Lucas ROLLA Linnie Shyrl, MD   Additional Attendees: Con Clunes, MD   Patient History: 58 y.o. male with DM, HTN and hyperlipidemia with no prior coronary artery disease or cardiac history.   Risk Factors: Diabetes Mellitus Hypertension Hyperlipidemia     Review of Prior Angiography and PCI Procedures: The team reviewed in detail the coronary angiogram from 03/02/24.   Discussion: The Heart Team unanimously agrees that surgical revascularization is not appropriate due to poor LAD target. PCI of LAD CTO is recommended as first-line intervention with optimization of antianginal therapy.   Recommendations: PCI     Shona Palma, RN  03/05/2024 12:25 PM

## 2024-03-05 NOTE — Telephone Encounter (Signed)
 Spoke to patient appointment scheduled with Dr.Jordan 12/2 at 3:20 pm to discuss CTO.

## 2024-03-05 NOTE — Telephone Encounter (Signed)
 Thank you all.  MJP

## 2024-03-05 NOTE — Telephone Encounter (Signed)
 We discussed this patient in multidisciplinary heart team meeting this morning. The meeting was attended by surgeons Drs. Lucas Rayas and Su, as well as Journalist, Newspaper. Arida, Ganji, End, Thukkani and myself. The consensus was that while the disease is multivessel, his surgical targets are very poor with diffusely disease and calcified LAD as well as OM1. The recommendation was to further optimize his anti anginal therapy, if possible, and refer him to Dr. Peter Jordan to discuss LAD CTO intervention as that may offer the best angina relief. I will let the patient know about this discussion. Lyle, could you please arrange the referral to Dr. Jordan?  Thanks MJP

## 2024-03-07 ENCOUNTER — Ambulatory Visit: Payer: Self-pay | Admitting: Cardiology

## 2024-03-10 NOTE — Progress Notes (Signed)
 Cardiology Office Note:    Date:  03/16/2024   ID:  Justin Houston, DOB [birth year], MRN [id number]  PCP:  Vicci Barnie NOVAK, MD   [facility] Providers Cardiologist:  None     Referring MD: Vicci Barnie NOVAK, MD   Chief Complaint  Patient presents with   Coronary Artery Disease   Chest Pain    History of Present Illness:    Justin Houston is a 58 y.o. male seen at the request of Dr Elmira for consideration of complex PCI. He has a history of DM, HLD, HTN, obesity.  He was seen in October with one month history of exertional chest pain and SOB. Ecg showed evidence of old anteroseptal infarct and T wave inversions. He underwent cardiac cath on 03/02/24 showing severe multivessel CAD with occlusion of the LAD and first OM with collaterals. Also severe proximal LCx stenosis and severe diffuse stenosis in the RCA and PDA. Echo showed EF 50% with inferior HK. He was presented to our heart team and felt to be a poor candidate for CABG due to poor targets particularly the LAD. He has persistent symptoms despite optimal medical therapy.   Past Medical History:  Diagnosis Date   Dermatitis    Diabetes mellitus    GERD (gastroesophageal reflux disease)    Gout    Hyperlipidemia    Hypertension    Lipoma of scalp    Mass of head, right postauricular SQ 7x4cm 09/29/2013   Obesity    Toenail fungus 08/31/2018    Past Surgical History:  Procedure Laterality Date   COLONOSCOPY WITH PROPOFOL  N/A 12/01/2014   Procedure: COLONOSCOPY WITH PROPOFOL ;  Surgeon: Toribio SHAUNNA Cedar, MD;  Location: [facility];  Service: Endoscopy;  Laterality: N/A;   COLONOSCOPY WITH PROPOFOL  N/A 12/20/2021   Procedure: COLONOSCOPY WITH PROPOFOL ;  Surgeon: Aneita Gwendlyn DASEN, MD;  Location: [facility];  Service: Gastroenterology;  Laterality: N/A;   LEFT HEART CATH AND CORONARY ANGIOGRAPHY N/A 03/02/2024   Procedure: LEFT HEART CATH AND CORONARY ANGIOGRAPHY;  Surgeon: Elmira Justin PARAS, MD;   Location: [facility];  Service: Cardiovascular;  Laterality: N/A;   LIPOMA EXCISION N/A 01/28/2020   Procedure: EXCISION RIGHT SCALP LIPOMA;  Surgeon: Elisabeth Craig RAMAN, MD;  Location: [facility];  Service: Plastics;  Laterality: N/A;   POLYPECTOMY  12/20/2021   Procedure: POLYPECTOMY;  Surgeon: Aneita Gwendlyn DASEN, MD;  Location: [facility];  Service: Gastroenterology;;   right arm ORIF      Current Medications: Current Meds  Medication Sig   amLODipine  (NORVASC ) 10 MG tablet Take 1 tablet (10 mg total) by mouth daily.   aspirin  EC 81 MG tablet Take 1 tablet (81 mg total) by mouth daily. Swallow whole.   atorvastatin  (LIPITOR) 40 MG tablet Take 1 tablet (40 mg total) by mouth daily.   Blood Glucose Monitoring Suppl (ONETOUCH VERIO) w/Device KIT Use as instructed to check blood sugar 3 times daily. E11.319 Z79.4   Blood Pressure Monitoring (BLOOD PRESSURE KIT) DEVI Use to measure blood pressure   brimonidine  (ALPHAGAN ) 0.2 % ophthalmic solution 1 drop 2 (two) times daily.   carvedilol  (COREG ) 25 MG tablet Take 1 tablet (25 mg total) by mouth 2 (two) times daily with a meal.   cetirizine  (ZYRTEC  ALLERGY) 10 MG tablet Take 1 tablet (10 mg total) by mouth daily.   chlorthalidone  (HYGROTON ) 25 MG tablet TAKE 1 TABLET BY MOUTH DAILY.   clobetasol  ointment (TEMOVATE ) 0.05 % Apply 1 Application  topically 3 (three) times daily as needed (rash.).   clopidogrel (PLAVIX) 75 MG tablet Take 1 tablet (75 mg total) by mouth daily.   dapagliflozin  propanediol (FARXIGA ) 10 MG TABS tablet Take 1 tablet (10 mg total) by mouth daily.   Dulaglutide  (TRULICITY ) 1.5 MG/0.5ML SOAJ inject 1.5mg  into THE SKIN ONCE WEEKLY   fluticasone  (FLONASE ) 50 MCG/ACT nasal spray Place 2 sprays into both nostrils daily.   GLUCOSAMINE HCL PO Take 2,000 mg by mouth 2 (two) times daily. With Vitamin D   glucose blood (ONETOUCH VERIO) test strip USE AS INSTRUCTED TO CHECK BLOOD SUGAR 3 TIMES DAILY   hydrALAZINE   (APRESOLINE ) 100 MG tablet TAKE 1 TABLET BY MOUTH 3 TIMES DAILY   insulin  glargine (LANTUS ) 100 UNIT/ML injection Inject 0.4 mLs (40 Units total) into the skin 2 (two) times daily.   Insulin  Syringe-Needle U-100 (BD INSULIN  SYRINGE U/F) 31G X 5/16 1 ML MISC Inject 1 each into the skin 2 (two) times daily.   latanoprost (XALATAN) 0.005 % ophthalmic solution Place 1 drop into both eyes at bedtime.   metFORMIN  (GLUCOPHAGE ) 1000 MG tablet Take 1 tablet (1,000 mg total) by mouth 2 (two) times daily with a meal.   nitroGLYCERIN  (NITROSTAT ) 0.4 MG SL tablet Place 1 tablet (0.4 mg total) under the tongue every 5 (five) minutes as needed for chest pain.   omeprazole  (PRILOSEC) 20 MG capsule Take 1 capsule (20 mg total) by mouth daily.   OneTouch Delica Lancets 33G MISC Use as instructed to check blood sugar 3 times daily. E11.319 Z79.4   spironolactone  (ALDACTONE ) 50 MG tablet Take 1 tablet (50 mg total) by mouth daily.   STIMULANT LAXATIVE 8.6-50 MG tablet TAKE 1 TABLET BY MOUTH DAILY (Patient taking differently: Take 1 tablet by mouth daily as needed for mild constipation.)     Allergies:   Losartan potassium-hctz and Cozaar [losartan potassium]   Social History   Socioeconomic History   Marital status: Single    Spouse name: n/a   Number of children: 0   Years of education: 18   Highest education level: Not on file  Occupational History   Occupation: EC Lobbyist: [employer]  Tobacco Use   Smoking status: Former    Current packs/day: 0.00    Types: Cigarettes    Quit date: 09/05/1994    Years since quitting: 29.5   Smokeless tobacco: Never  Vaping Use   Vaping status: Never Used  Substance and Sexual Activity   Alcohol use: No    Alcohol/week: 0.0 standard drinks of alcohol   Drug use: No   Sexual activity: Never  Other Topics Concern   Not on file  Social History Narrative   Master's Degree in Adult Education.  Lives alone.   No siblings.    Social Drivers of Corporate Investment Banker Strain: Low Risk  (10/23/2023)   Overall Financial Resource Strain (CARDIA)    Difficulty of Paying Living Expenses: Not hard at all  Food Insecurity: No Food Insecurity (10/23/2023)   Hunger Vital Sign    Worried About Running Out of Food in the Last Year: Never true    Ran Out of Food in the Last Year: Never true  Transportation Needs: No Transportation Needs (10/23/2023)   PRAPARE - Administrator, Civil Service (Medical): No    Lack of Transportation (Non-Medical): No  Physical Activity: Not on file  Stress: No Stress Concern Present (10/23/2023)   [employer]  of Occupational Health - Occupational Stress Questionnaire    Feeling of Stress: Not at all  Social Connections: Moderately Integrated (10/23/2023)   Social Connection and Isolation Panel    Frequency of Communication with Friends and Family: Three times a week    Frequency of Social Gatherings with Friends and Family: Three times a week    Attends Religious Services: 1 to 4 times per year    Active Member of Clubs or Organizations: Yes    Attends Engineer, Structural: More than 4 times per year    Marital Status: Never married     Family History: The patient's family history includes Diabetes in his maternal grandmother and another family member; Hypertension in his mother; Prostate cancer in his maternal grandfather; Stroke in his mother.  ROS:   Please see the history of present illness.     All other systems reviewed and are negative.  EKGs/Labs/Other Studies Reviewed:    The following studies were reviewed today: Echo 02/22/24: IMPRESSIONS     1. Left ventricular ejection fraction, by estimation, is 50%. Left  ventricular ejection fraction by 2D MOD biplane is 47.9 %. The left  ventricle has mildly decreased function. The left ventricle demonstrates  regional wall motion abnormalities (see  scoring diagram/findings for description). There  is moderate concentric  left ventricular hypertrophy. Left ventricular diastolic parameters are  consistent with Grade I diastolic dysfunction (impaired relaxation). The  average left ventricular global  longitudinal strain is -12.4 %. The global longitudinal strain is  abnormal.   2. Right ventricular systolic function is normal. The right ventricular  size is normal. Tricuspid regurgitation signal is inadequate for assessing  PA pressure.   3. The mitral valve is normal in structure. Trivial mitral valve  regurgitation. No evidence of mitral stenosis.   4. The aortic valve is tricuspid. There is trivial aortic regurgitation.  There is no aortic valve stenosis. There is a small echodensity on the  non-coronary cusp of the aortic valve favored to be nodular calcification.   5. The inferior vena cava is normal in size with greater than 50%  respiratory variability, suggesting right atrial pressure of 3 mmHg.   Comparison(s): No prior Echocardiogram.   Conclusion(s)/Recommendation(s): Mildly reduced LV systolic function with  an LVEF = 50%. Normal RV systolic function. No significant valvular  disease.   LEFT HEART CATH AND CORONARY ANGIOGRAPHY   Conclusion  Coronary angiography 03/02/2024: LM: Distal 20% stenosis LAD: Mid 80% calcific stenosis, followed by subtotal calcific occlusion of mid LAD          Mid to distal LAD diffuse 80% calcific disease          Large diag 2 with diffuse 60% disease Lcx: Proximal 90% stenosis         Subtotal occlusion of OM1 RCA: Diffuse 80% calcific prox-mid stenosis             Distal 70% disease           RPDA focal 90% stenosis   Collateralization from RCA to LAD/septals, diagonal-LAD and OM1   LVEDP 24 mmHg      Conclusion: Severe multivessel coronary artery disease I am doubtful about CABG targets to LAD and RPDA, diag, OM could be targets Percutaneous intervention unlikely to feasible and beneficial Overall, very difficult anatomy  with severe disease Will discuss in multidisciplinary heart team meeting and refer to see CVTS (unless deemed not to be surgical candidate during Friday meeting)   Unusually delayed  procedure time due to following reasons: Severe tortuosity in right subclavian artery, right innominate artery, and aorta necessitating use of destination sheath.  In spite of using destination sheath, it was extremely challenging to engage his coronary arteries requiring use of multiple catheters, wires and significant difficulty requiring long procedure time, and fluoroscopy time.   Justin JINNY Lawrence, MD     Cardiac cath 11/18 /25:       Recent Labs: 10/23/2023: ALT 9 03/01/2024: BUN 18; Creatinine, Ser 1.00; Hemoglobin 14.5; Platelets 221; Potassium 4.3; Sodium 139  Recent Lipid Panel    Component Value Date/Time   CHOL 187 01/09/2024 1637   TRIG 121 01/09/2024 1637   HDL 34 (L) 01/09/2024 1637   CHOLHDL 5.5 (H) 01/09/2024 1637   CHOLHDL 3.8 01/16/2016 1619   VLDL 23 01/16/2016 1619   LDLCALC 131 (H) 01/09/2024 1637     Risk Assessment/Calculations:                Physical Exam:    VS:  BP 138/78 (BP Location: Left Arm, Patient Position: Sitting, Cuff Size: Large)   Pulse 70   Ht 5' 11 (1.803 m)   Wt (!) 311 lb (141.1 kg)   SpO2 97%   BMI 43.38 kg/m     Wt Readings from Last 3 Encounters:  03/16/24 (!) 311 lb (141.1 kg)  03/02/24 (!) 310 lb (140.6 kg)  02/26/24 (!) 307 lb 6.4 oz (139.4 kg)     GEN:  Well nourished, morbidly obese, in no acute distress HEENT: Normal NECK: No JVD; No carotid bruits LYMPHATICS: No lymphadenopathy CARDIAC: RRR, no murmurs, rubs, gallops RESPIRATORY:  Clear to auscultation without rales, wheezing or rhonchi  ABDOMEN: Soft, non-tender, non-distended MUSCULOSKELETAL:  No edema; No deformity. Good radial pulse.  SKIN: Warm and dry NEUROLOGIC:  Alert and oriented x 3 PSYCHIATRIC:  Normal affect   ASSESSMENT:    1. Pre-procedure lab exam   2.  Coronary artery disease of native artery of native heart with stable angina pectoris   3. Type 2 diabetes mellitus with complication, without long-term current use of insulin  (HCC)   4. Essential hypertension   5. Dyslipidemia    PLAN:    In order of problems listed above:  CAD with class 3 angina despite optimal medical therapy. Patient has severe complex CAD including CTO of the LAD and OM1. He is not felt to be a suitable candidate for CABG due to poor targets. I think it is reasonable to try and address what we can with PCI. I would attempt CTO PCI of th LAD first since this would offer the greatest benefit. If this goes well then stent the proximal LCx. Doubt we would be able to open the first OM unless wire passed fairly easily. I would treat the RCA medically since percutaneous treatment would not be able to address the PDA due to size and the RCA would require a full metal jacket. Will initiate Plavix therapy. Procedure and risk explained in detail with patient and cousin. The procedure and risks were reviewed including but not limited to death, myocardial infarction, stroke, arrythmias, perforation, bleeding, transfusion, emergency surgery, dye allergy, or renal dysfunction. The patient voices understanding and is agreeable to proceed. Will arrange next Friday Dec 12.        Informed Consent   Shared Decision Making/Informed Consent The risks [stroke (1 in 1000), death (1 in 1000), kidney failure [usually temporary] (1 in 500), bleeding (1 in 200), allergic reaction [possibly serious] (  1 in 200)], benefits (diagnostic support and management of coronary artery disease) and alternatives of a cardiac catheterization were discussed in detail with Mr. Burnsworth and he is willing to proceed.       Medication Adjustments/Labs and Tests Ordered: Current medicines are reviewed at length with the patient today.  Concerns regarding medicines are outlined above.  Orders Placed This Encounter   Procedures   Basic metabolic panel with GFR   CBC w/Diff   Meds ordered this encounter  Medications   clopidogrel (PLAVIX) 75 MG tablet    Sig: Take 1 tablet (75 mg total) by mouth daily.    Dispense:  90 tablet    Refill:  3    Patient Instructions  Medication Instructions:  Start Plavix 75 mg daily Continue all other medications *If you need a refill on your cardiac medications before your next appointment, please call your pharmacy*  Lab Work: Bmet,cbc to be done this Friday 12/5   Testing/Procedures: Cardiac Cath scheduled at [facility] Friday 12/12 Arrive at 5:30 am   Follow instructions below  Follow-Up: At [facility], you and your health needs are our priority.  As part of our continuing mission to provide you with exceptional heart care, our providers are all part of one team.  This team includes your primary Cardiologist (physician) and Advanced Practice Providers or APPs (Physician Assistants and Nurse Practitioners) who all work together to provide you with the care you need, when you need it.  Your next appointment:  After Cath    Provider:  Dr.Isaul Landi           [facility] A DEPT OF [facility] AT [address] A DEPT OF THE [place] [address] [place] [address] Dept: [phone] Loc: [phone]  Justin Houston  03/16/2024  You are scheduled for a Cardiac Catheterization on Friday, December 12 with Dr. Emmalyn Hinson.  1. Please arrive at the [facility] (Main Entrance A) at [facility]: [address] [place], [place] [address] at 5:30 AM (This time is 2 hour(s) before your procedure to ensure your preparation).   Free valet parking service is available. You will check in at ADMITTING. The support person will be asked to wait in the waiting room.  It is OK to have someone drop you off and come back when you are ready to be discharged.    Special note: Every effort is  made to have your procedure done on time. Please understand that emergencies sometimes delay scheduled procedures.  2. Diet: Nothing to eat after midnight.   3. Hydration: You need to be well hydrated before your procedure. On December 12, you may drink approved liquids (see below) until 2 hours before the procedure, with 16 oz of water  as your last intake.   List of approved liquids water , clear juice, clear tea, black coffee, fruit juices, non-citric and without pulp, carbonated beverages, Gatorade, Kool -Aid, plain Jello-O and plain ice popsicles.  4. Labs: You will need to have blood drawn on This Friday 12/5 at [facility] at Dr.Celedonio Sortino's office  5. Medication instructions in preparation for your procedure:      Hold Trulicity  7 days before cath  Hold Farxiga  3 days before cath Hold Metformin  day of cath and Hold 2 days after cath Take 1/2 dose od Insulin  night before  Hold morning dose of Insulin           On the  morning of your procedure, take your Aspirin  81 mg and Plavix/Clopidogrel and any morning medicines NOT listed above.  You may use sips of water .  6. Plan to go home the same day, you will only stay overnight if medically necessary. 7. Bring a current list of your medications and current insurance cards. 8. You MUST have a responsible person to drive you home. 9. Someone MUST be with you the first 24 hours after you arrive home or your discharge will be delayed. 10. Please wear clothes that are easy to get on and off and wear slip-on shoes.  Thank you for allowing us  to care for you!   -- [facility]     We recommend signing up for the patient portal called MyChart.  Sign up information is provided on this After Visit Summary.  MyChart is used to connect with patients for Virtual Visits (Telemedicine).  Patients are able to view lab/test results, encounter notes, upcoming appointments, etc.  Non-urgent messages can be sent to your  provider as well.   To learn more about what you can do with MyChart, go to [email or web address].      Signed, Alfreida Steffenhagen, MD  03/16/2024 4:48 PM    [facility]

## 2024-03-10 NOTE — H&P (View-Only) (Signed)
 Cardiology Office Note:    Date:  03/16/2024   ID:  Justin Houston, DOB [birth year], MRN [id number]  PCP:  Vicci Barnie NOVAK, MD   [facility] Providers Cardiologist:  None     Referring MD: Vicci Barnie NOVAK, MD   Chief Complaint  Patient presents with   Coronary Artery Disease   Chest Pain    History of Present Illness:    Justin Houston is a 58 y.o. male seen at the request of Dr Elmira for consideration of complex PCI. He has a history of DM, HLD, HTN, obesity.  He was seen in October with one month history of exertional chest pain and SOB. Ecg showed evidence of old anteroseptal infarct and T wave inversions. He underwent cardiac cath on 03/02/24 showing severe multivessel CAD with occlusion of the LAD and first OM with collaterals. Also severe proximal LCx stenosis and severe diffuse stenosis in the RCA and PDA. Echo showed EF 50% with inferior HK. He was presented to our heart team and felt to be a poor candidate for CABG due to poor targets particularly the LAD. He has persistent symptoms despite optimal medical therapy.   Past Medical History:  Diagnosis Date   Dermatitis    Diabetes mellitus    GERD (gastroesophageal reflux disease)    Gout    Hyperlipidemia    Hypertension    Lipoma of scalp    Mass of head, right postauricular SQ 7x4cm 09/29/2013   Obesity    Toenail fungus 08/31/2018    Past Surgical History:  Procedure Laterality Date   COLONOSCOPY WITH PROPOFOL  N/A 12/01/2014   Procedure: COLONOSCOPY WITH PROPOFOL ;  Surgeon: Toribio SHAUNNA Cedar, MD;  Location: [facility];  Service: Endoscopy;  Laterality: N/A;   COLONOSCOPY WITH PROPOFOL  N/A 12/20/2021   Procedure: COLONOSCOPY WITH PROPOFOL ;  Surgeon: Aneita Gwendlyn DASEN, MD;  Location: [facility];  Service: Gastroenterology;  Laterality: N/A;   LEFT HEART CATH AND CORONARY ANGIOGRAPHY N/A 03/02/2024   Procedure: LEFT HEART CATH AND CORONARY ANGIOGRAPHY;  Surgeon: Elmira Newman PARAS, MD;   [facility]: [facility];  Service: Cardiovascular;  Laterality: N/A;   LIPOMA EXCISION N/A 01/28/2020   Procedure: EXCISION RIGHT SCALP LIPOMA;  Surgeon: Elisabeth Craig RAMAN, MD;  Location: [facility];  Service: Plastics;  Laterality: N/A;   POLYPECTOMY  12/20/2021   Procedure: POLYPECTOMY;  Surgeon: Aneita Gwendlyn DASEN, MD;  Location: [facility];  Service: Gastroenterology;;   right arm ORIF      Current Medications: Current Meds  Medication Sig   amLODipine  (NORVASC ) 10 MG tablet Take 1 tablet (10 mg total) by mouth daily.   aspirin  EC 81 MG tablet Take 1 tablet (81 mg total) by mouth daily. Swallow whole.   atorvastatin  (LIPITOR) 40 MG tablet Take 1 tablet (40 mg total) by mouth daily.   Blood Glucose Monitoring Suppl (ONETOUCH VERIO) w/Device KIT Use as instructed to check blood sugar 3 times daily. E11.319 Z79.4   Blood Pressure Monitoring (BLOOD PRESSURE KIT) DEVI Use to measure blood pressure   brimonidine  (ALPHAGAN ) 0.2 % ophthalmic solution 1 drop 2 (two) times daily.   carvedilol  (COREG ) 25 MG tablet Take 1 tablet (25 mg total) by mouth 2 (two) times daily with a meal.   cetirizine  (ZYRTEC  ALLERGY) 10 MG tablet Take 1 tablet (10 mg total) by mouth daily.   chlorthalidone  (HYGROTON ) 25 MG tablet TAKE 1 TABLET BY MOUTH DAILY.   clobetasol  ointment (TEMOVATE ) 0.05 % Apply 1 Application  topically 3 (three) times daily as needed (rash.).   clopidogrel (PLAVIX) 75 MG tablet Take 1 tablet (75 mg total) by mouth daily.   dapagliflozin  propanediol (FARXIGA ) 10 MG TABS tablet Take 1 tablet (10 mg total) by mouth daily.   Dulaglutide  (TRULICITY ) 1.5 MG/0.5ML SOAJ inject 1.5mg  into THE SKIN ONCE WEEKLY   fluticasone  (FLONASE ) 50 MCG/ACT nasal spray Place 2 sprays into both nostrils daily.   GLUCOSAMINE HCL PO Take 2,000 mg by mouth 2 (two) times daily. With Vitamin D   glucose blood (ONETOUCH VERIO) test strip USE AS INSTRUCTED TO CHECK BLOOD SUGAR 3 TIMES DAILY   hydrALAZINE   (APRESOLINE ) 100 MG tablet TAKE 1 TABLET BY MOUTH 3 TIMES DAILY   insulin  glargine (LANTUS ) 100 UNIT/ML injection Inject 0.4 mLs (40 Units total) into the skin 2 (two) times daily.   Insulin  Syringe-Needle U-100 (BD INSULIN  SYRINGE U/F) 31G X 5/16 1 ML MISC Inject 1 each into the skin 2 (two) times daily.   latanoprost (XALATAN) 0.005 % ophthalmic solution Place 1 drop into both eyes at bedtime.   metFORMIN  (GLUCOPHAGE ) 1000 MG tablet Take 1 tablet (1,000 mg total) by mouth 2 (two) times daily with a meal.   nitroGLYCERIN  (NITROSTAT ) 0.4 MG SL tablet Place 1 tablet (0.4 mg total) under the tongue every 5 (five) minutes as needed for chest pain.   omeprazole  (PRILOSEC) 20 MG capsule Take 1 capsule (20 mg total) by mouth daily.   OneTouch Delica Lancets 33G MISC Use as instructed to check blood sugar 3 times daily. E11.319 Z79.4   spironolactone  (ALDACTONE ) 50 MG tablet Take 1 tablet (50 mg total) by mouth daily.   STIMULANT LAXATIVE 8.6-50 MG tablet TAKE 1 TABLET BY MOUTH DAILY (Patient taking differently: Take 1 tablet by mouth daily as needed for mild constipation.)     Allergies:   Losartan potassium-hctz and Cozaar [losartan potassium]   Social History   Socioeconomic History   Marital status: Single    Spouse name: n/a   Number of children: 0   Years of education: 18   Highest education level: Not on file  Occupational History   Occupation: EC Lobbyist: [employer]  Tobacco Use   Smoking status: Former    Current packs/day: 0.00    Types: Cigarettes    Quit date: 09/05/1994    Years since quitting: 29.5   Smokeless tobacco: Never  Vaping Use   Vaping status: Never Used  Substance and Sexual Activity   Alcohol use: No    Alcohol/week: 0.0 standard drinks of alcohol   Drug use: No   Sexual activity: Never  Other Topics Concern   Not on file  Social History Narrative   Master's Degree in Adult Education.  Lives alone.   No siblings.    Social Drivers of Corporate Investment Banker Strain: Low Risk  (10/23/2023)   Overall Financial Resource Strain (CARDIA)    Difficulty of Paying Living Expenses: Not hard at all  Food Insecurity: No Food Insecurity (10/23/2023)   Hunger Vital Sign    Worried About Running Out of Food in the Last Year: Never true    Ran Out of Food in the Last Year: Never true  Transportation Needs: No Transportation Needs (10/23/2023)   PRAPARE - Administrator, Civil Service (Medical): No    Lack of Transportation (Non-Medical): No  Physical Activity: Not on file  Stress: No Stress Concern Present (10/23/2023)   [employer]  of Occupational Health - Occupational Stress Questionnaire    Feeling of Stress: Not at all  Social Connections: Moderately Integrated (10/23/2023)   Social Connection and Isolation Panel    Frequency of Communication with Friends and Family: Three times a week    Frequency of Social Gatherings with Friends and Family: Three times a week    Attends Religious Services: 1 to 4 times per year    Active Member of Clubs or Organizations: Yes    Attends Engineer, Structural: More than 4 times per year    Marital Status: Never married     Family History: The patient's family history includes Diabetes in his maternal grandmother and another family member; Hypertension in his mother; Prostate cancer in his maternal grandfather; Stroke in his mother.  ROS:   Please see the history of present illness.     All other systems reviewed and are negative.  EKGs/Labs/Other Studies Reviewed:    The following studies were reviewed today: Echo 02/22/24: IMPRESSIONS     1. Left ventricular ejection fraction, by estimation, is 50%. Left  ventricular ejection fraction by 2D MOD biplane is 47.9 %. The left  ventricle has mildly decreased function. The left ventricle demonstrates  regional wall motion abnormalities (see  scoring diagram/findings for description). There  is moderate concentric  left ventricular hypertrophy. Left ventricular diastolic parameters are  consistent with Grade I diastolic dysfunction (impaired relaxation). The  average left ventricular global  longitudinal strain is -12.4 %. The global longitudinal strain is  abnormal.   2. Right ventricular systolic function is normal. The right ventricular  size is normal. Tricuspid regurgitation signal is inadequate for assessing  PA pressure.   3. The mitral valve is normal in structure. Trivial mitral valve  regurgitation. No evidence of mitral stenosis.   4. The aortic valve is tricuspid. There is trivial aortic regurgitation.  There is no aortic valve stenosis. There is a small echodensity on the  non-coronary cusp of the aortic valve favored to be nodular calcification.   5. The inferior vena cava is normal in size with greater than 50%  respiratory variability, suggesting right atrial pressure of 3 mmHg.   Comparison(s): No prior Echocardiogram.   Conclusion(s)/Recommendation(s): Mildly reduced LV systolic function with  an LVEF = 50%. Normal RV systolic function. No significant valvular  disease.   LEFT HEART CATH AND CORONARY ANGIOGRAPHY   Conclusion  Coronary angiography 03/02/2024: LM: Distal 20% stenosis LAD: Mid 80% calcific stenosis, followed by subtotal calcific occlusion of mid LAD          Mid to distal LAD diffuse 80% calcific disease          Large diag 2 with diffuse 60% disease Lcx: Proximal 90% stenosis         Subtotal occlusion of OM1 RCA: Diffuse 80% calcific prox-mid stenosis             Distal 70% disease           RPDA focal 90% stenosis   Collateralization from RCA to LAD/septals, diagonal-LAD and OM1   LVEDP 24 mmHg      Conclusion: Severe multivessel coronary artery disease I am doubtful about CABG targets to LAD and RPDA, diag, OM could be targets Percutaneous intervention unlikely to feasible and beneficial Overall, very difficult anatomy  with severe disease Will discuss in multidisciplinary heart team meeting and refer to see CVTS (unless deemed not to be surgical candidate during Friday meeting)   Unusually delayed  procedure time due to following reasons: Severe tortuosity in right subclavian artery, right innominate artery, and aorta necessitating use of destination sheath.  In spite of using destination sheath, it was extremely challenging to engage his coronary arteries requiring use of multiple catheters, wires and significant difficulty requiring long procedure time, and fluoroscopy time.   Newman JINNY Lawrence, MD     Cardiac cath 11/18 /25:       Recent Labs: 10/23/2023: ALT 9 03/01/2024: BUN 18; Creatinine, Ser 1.00; Hemoglobin 14.5; Platelets 221; Potassium 4.3; Sodium 139  Recent Lipid Panel    Component Value Date/Time   CHOL 187 01/09/2024 1637   TRIG 121 01/09/2024 1637   HDL 34 (L) 01/09/2024 1637   CHOLHDL 5.5 (H) 01/09/2024 1637   CHOLHDL 3.8 01/16/2016 1619   VLDL 23 01/16/2016 1619   LDLCALC 131 (H) 01/09/2024 1637     Risk Assessment/Calculations:                Physical Exam:    VS:  BP 138/78 (BP Location: Left Arm, Patient Position: Sitting, Cuff Size: Large)   Pulse 70   Ht 5' 11 (1.803 m)   Wt (!) 311 lb (141.1 kg)   SpO2 97%   BMI 43.38 kg/m     Wt Readings from Last 3 Encounters:  03/16/24 (!) 311 lb (141.1 kg)  03/02/24 (!) 310 lb (140.6 kg)  02/26/24 (!) 307 lb 6.4 oz (139.4 kg)     GEN:  Well nourished, morbidly obese, in no acute distress HEENT: Normal NECK: No JVD; No carotid bruits LYMPHATICS: No lymphadenopathy CARDIAC: RRR, no murmurs, rubs, gallops RESPIRATORY:  Clear to auscultation without rales, wheezing or rhonchi  ABDOMEN: Soft, non-tender, non-distended MUSCULOSKELETAL:  No edema; No deformity. Good radial pulse.  SKIN: Warm and dry NEUROLOGIC:  Alert and oriented x 3 PSYCHIATRIC:  Normal affect   ASSESSMENT:    1. Pre-procedure lab exam   2.  Coronary artery disease of native artery of native heart with stable angina pectoris   3. Type 2 diabetes mellitus with complication, without long-term current use of insulin  (HCC)   4. Essential hypertension   5. Dyslipidemia    PLAN:    In order of problems listed above:  CAD with class 3 angina despite optimal medical therapy. Patient has severe complex CAD including CTO of the LAD and OM1. He is not felt to be a suitable candidate for CABG due to poor targets. I think it is reasonable to try and address what we can with PCI. I would attempt CTO PCI of th LAD first since this would offer the greatest benefit. If this goes well then stent the proximal LCx. Doubt we would be able to open the first OM unless wire passed fairly easily. I would treat the RCA medically since percutaneous treatment would not be able to address the PDA due to size and the RCA would require a full metal jacket. Will initiate Plavix therapy. Procedure and risk explained in detail with patient and cousin. The procedure and risks were reviewed including but not limited to death, myocardial infarction, stroke, arrythmias, perforation, bleeding, transfusion, emergency surgery, dye allergy, or renal dysfunction. The patient voices understanding and is agreeable to proceed. Will arrange next Friday Dec 12.        Informed Consent   Shared Decision Making/Informed Consent The risks [stroke (1 in 1000), death (1 in 1000), kidney failure [usually temporary] (1 in 500), bleeding (1 in 200), allergic reaction [possibly serious] (  1 in 200)], benefits (diagnostic support and management of coronary artery disease) and alternatives of a cardiac catheterization were discussed in detail with Justin Houston and he is willing to proceed.       Medication Adjustments/Labs and Tests Ordered: Current medicines are reviewed at length with the patient today.  Concerns regarding medicines are outlined above.  Orders Placed This Encounter   Procedures   Basic metabolic panel with GFR   CBC w/Diff   Meds ordered this encounter  Medications   clopidogrel (PLAVIX) 75 MG tablet    Sig: Take 1 tablet (75 mg total) by mouth daily.    Dispense:  90 tablet    Refill:  3    Patient Instructions  Medication Instructions:  Start Plavix 75 mg daily Continue all other medications *If you need a refill on your cardiac medications before your next appointment, please call your pharmacy*  Lab Work: Bmet,cbc to be done this Friday 12/5   Testing/Procedures: Cardiac Cath scheduled at [facility] Friday 12/12 Arrive at 5:30 am   Follow instructions below  Follow-Up: At [facility], you and your health needs are our priority.  As part of our continuing mission to provide you with exceptional heart care, our providers are all part of one team.  This team includes your primary Cardiologist (physician) and Advanced Practice Providers or APPs (Physician Assistants and Nurse Practitioners) who all work together to provide you with the care you need, when you need it.  Your next appointment:  After Cath    Provider:  Dr.Isaul Landi           [facility] A DEPT OF [place] AT [address] A DEPT OF THE [place] [address] [place] [address] Dept: [phone] Loc: [phone]  Justin Houston  03/16/2024  You are scheduled for a Cardiac Catheterization on Friday, December 12 with Dr. Emmalyn Hinson.  1. Please arrive at the [facility] (Main Entrance A) at [facility]: [address] [place], [place] [address] at 5:30 AM (This time is 2 hour(s) before your procedure to ensure your preparation).   Free valet parking service is available. You will check in at ADMITTING. The support person will be asked to wait in the waiting room.  It is OK to have someone drop you off and come back when you are ready to be discharged.    Special note: Every effort is  made to have your procedure done on time. Please understand that emergencies sometimes delay scheduled procedures.  2. Diet: Nothing to eat after midnight.   3. Hydration: You need to be well hydrated before your procedure. On December 12, you may drink approved liquids (see below) until 2 hours before the procedure, with 16 oz of water  as your last intake.   List of approved liquids water , clear juice, clear tea, black coffee, fruit juices, non-citric and without pulp, carbonated beverages, Gatorade, Kool -Aid, plain Jello-O and plain ice popsicles.  4. Labs: You will need to have blood drawn on This Friday 12/5 at [facility] at Dr.Celedonio Sortino's office  5. Medication instructions in preparation for your procedure:      Hold Trulicity  7 days before cath  Hold Farxiga  3 days before cath Hold Metformin  day of cath and Hold 2 days after cath Take 1/2 dose od Insulin  night before  Hold morning dose of Insulin           On the  morning of your procedure, take your Aspirin  81 mg and Plavix/Clopidogrel and any morning medicines NOT listed above.  You may use sips of water .  6. Plan to go home the same day, you will only stay overnight if medically necessary. 7. Bring a current list of your medications and current insurance cards. 8. You MUST have a responsible person to drive you home. 9. Someone MUST be with you the first 24 hours after you arrive home or your discharge will be delayed. 10. Please wear clothes that are easy to get on and off and wear slip-on shoes.  Thank you for allowing us  to care for you!   -- [facility]     We recommend signing up for the patient portal called MyChart.  Sign up information is provided on this After Visit Summary.  MyChart is used to connect with patients for Virtual Visits (Telemedicine).  Patients are able to view lab/test results, encounter notes, upcoming appointments, etc.  Non-urgent messages can be sent to your  provider as well.   To learn more about what you can do with MyChart, go to [email or web address].      Signed, Alfreida Steffenhagen, MD  03/16/2024 4:48 PM    [facility]

## 2024-03-16 ENCOUNTER — Encounter: Payer: Self-pay | Admitting: Cardiology

## 2024-03-16 ENCOUNTER — Other Ambulatory Visit: Payer: Self-pay | Admitting: Cardiology

## 2024-03-16 ENCOUNTER — Ambulatory Visit: Attending: Cardiology | Admitting: Cardiology

## 2024-03-16 VITALS — BP 138/78 | HR 70 | Ht 71.0 in | Wt 311.0 lb

## 2024-03-16 DIAGNOSIS — I10 Essential (primary) hypertension: Secondary | ICD-10-CM | POA: Diagnosis not present

## 2024-03-16 DIAGNOSIS — Z01812 Encounter for preprocedural laboratory examination: Secondary | ICD-10-CM | POA: Diagnosis not present

## 2024-03-16 DIAGNOSIS — E118 Type 2 diabetes mellitus with unspecified complications: Secondary | ICD-10-CM | POA: Diagnosis not present

## 2024-03-16 DIAGNOSIS — E785 Hyperlipidemia, unspecified: Secondary | ICD-10-CM

## 2024-03-16 DIAGNOSIS — I25118 Atherosclerotic heart disease of native coronary artery with other forms of angina pectoris: Secondary | ICD-10-CM

## 2024-03-16 DIAGNOSIS — I209 Angina pectoris, unspecified: Secondary | ICD-10-CM

## 2024-03-16 MED ORDER — CLOPIDOGREL BISULFATE 75 MG PO TABS: 75.0000 mg | ORAL_TABLET | Freq: Every day | ORAL | 3 refills | Status: AC

## 2024-03-16 NOTE — Patient Instructions (Addendum)
 Medication Instructions:  Start Plavix 75 mg daily Continue all other medications *If you need a refill on your cardiac medications before your next appointment, please call your pharmacy*  Lab Work: Bmet,cbc to be done this Friday 12/5   Testing/Procedures: Cardiac Cath scheduled at [facility] Friday 12/12 Arrive at 5:30 am   Follow instructions below  Follow-Up: At [facility], you and your health needs are our priority.  As part of our continuing mission to provide you with exceptional heart care, our providers are all part of one team.  This team includes your primary Cardiologist (physician) and Advanced Practice Providers or APPs (Physician Assistants and Nurse Practitioners) who all work together to provide you with the care you need, when you need it.  Your next appointment:  After Cath    Provider:  Dr.Jordan           [place] A DEPT OF [place] HEARTCARE AT [address] A DEPT OF THE [place] [address]  [place] [address] Dept: [phone] Loc: [phone]  ALGER KERSTEIN  03/16/2024  You are scheduled for a Cardiac Catheterization on Friday, December 12 with Dr. Peter Jordan.  1. Please arrive at the [facility] (Main Entrance A) at [facility]: [address] [place], [place] [address] at 5:30 AM (This time is 2 hour(s) before your procedure to ensure your preparation).   Free valet parking service is available. You will check in at ADMITTING. The support person will be asked to wait in the waiting room.  It is OK to have someone drop you off and come back when you are ready to be discharged.    Special note: Every effort is made to have your procedure done on time. Please understand that emergencies sometimes delay scheduled procedures.  2. Diet: Nothing to eat after midnight.   3. Hydration: You need to be well hydrated before your procedure. On December 12, you may drink approved  liquids (see below) until 2 hours before the procedure, with 16 oz of water  as your last intake.   List of approved liquids water , clear juice, clear tea, black coffee, fruit juices, non-citric and without pulp, carbonated beverages, Gatorade, Kool -Aid, plain Jello-O and plain ice popsicles.  4. Labs: You will need to have blood drawn on This Friday 12/5 at [facility] at Dr.Jordan's office  5. Medication instructions in preparation for your procedure:      Hold Trulicity  7 days before cath Start holding 12/6   Hold Farxiga  3 days before cath Start holding 12/10  Hold Metformin  day of cath and Hold 2 days after cath  Start holding 12/12 and Hold 12/13 and 12/14  Take 1/2 dose of Insulin  night before   Hold morning dose of Insulin           On the morning of your procedure, take your Aspirin  81 mg and Plavix/Clopidogrel and any morning medicines NOT listed above.  You may use sips of water .  6. Plan to go home the same day, you will only stay overnight if medically necessary. 7. Bring a current list of your medications and current insurance cards. 8. You MUST have a responsible person to drive you home. 9. Someone MUST be with you the first 24 hours after you arrive home or your discharge will be delayed. 10. Please wear clothes that are easy to get on and off and wear slip-on shoes.  Thank you for allowing  us  to care for you!   -- [facility]     We recommend signing up for the patient portal called MyChart.  Sign up information is provided on this After Visit Summary.  MyChart is used to connect with patients for Virtual Visits (Telemedicine).  Patients are able to view lab/test results, encounter notes, upcoming appointments, etc.  Non-urgent messages can be sent to your provider as well.   To learn more about what you can do with MyChart, go to [email or web address].

## 2024-03-18 ENCOUNTER — Telehealth: Payer: Self-pay | Admitting: Cardiology

## 2024-03-18 LAB — CBC WITH DIFFERENTIAL/PLATELET
Basophils Absolute: 0 x10E3/uL (ref 0.0–0.2)
Basos: 1 %
EOS (ABSOLUTE): 0 x10E3/uL (ref 0.0–0.4)
Eos: 1 %
Hematocrit: 43.2 % (ref 37.5–51.0)
Hemoglobin: 14.2 g/dL (ref 13.0–17.7)
Immature Grans (Abs): 0 x10E3/uL (ref 0.0–0.1)
Immature Granulocytes: 0 %
Lymphocytes Absolute: 1.8 x10E3/uL (ref 0.7–3.1)
Lymphs: 28 %
MCH: 31 pg (ref 26.6–33.0)
MCHC: 32.9 g/dL (ref 31.5–35.7)
MCV: 94 fL (ref 79–97)
Monocytes Absolute: 0.5 x10E3/uL (ref 0.1–0.9)
Monocytes: 8 %
Neutrophils Absolute: 4.1 x10E3/uL (ref 1.4–7.0)
Neutrophils: 62 %
Platelets: 210 x10E3/uL (ref 150–450)
RBC: 4.58 x10E6/uL (ref 4.14–5.80)
RDW: 12.8 % (ref 11.6–15.4)
WBC: 6.5 x10E3/uL (ref 3.4–10.8)

## 2024-03-18 LAB — BASIC METABOLIC PANEL WITH GFR
BUN/Creatinine Ratio: 19 (ref 9–20)
BUN: 24 mg/dL (ref 6–24)
CO2: 23 mmol/L (ref 20–29)
Calcium: 9.2 mg/dL (ref 8.7–10.2)
Chloride: 102 mmol/L (ref 96–106)
Creatinine, Ser: 1.24 mg/dL (ref 0.76–1.27)
Glucose: 127 mg/dL — ABNORMAL HIGH (ref 70–99)
Potassium: 4.2 mmol/L (ref 3.5–5.2)
Sodium: 141 mmol/L (ref 134–144)
eGFR: 67 mL/min/1.73 (ref >59)

## 2024-03-18 NOTE — Telephone Encounter (Signed)
 Spoke to patient cath instructions reviewed and medications he is to hold reviewed.He voiced understanding.He will have pre cath labs this afternoon.

## 2024-03-18 NOTE — Telephone Encounter (Signed)
 Pt calling to see which medication it is he needs to hold the day of or the night before his procedure 12/12. Please advise.

## 2024-03-19 ENCOUNTER — Ambulatory Visit: Payer: Self-pay | Admitting: Cardiology

## 2024-03-24 ENCOUNTER — Telehealth: Payer: Self-pay | Admitting: *Deleted

## 2024-03-24 NOTE — Telephone Encounter (Addendum)
 Coronary Stent scheduled at [facility] for: Friday March 26, 2024 7:30 AM Arrival time [facility] Main Entrance A at: 5:30 AM  Diet: -Nothing to eat after midnight.  Hydration: -May drink clear liquids until 2 hours before the procedure.  Approved liquids: Water , clear tea, black coffee, fruit juices-non-citric and without pulp,Gatorade, plain Jello/popsicles.   -Please drink 16 oz of water  2 hours before procedure.   Medication instructions: -Hold:  Metformin -day of procedure and 48 hours after procedure  Insulin -AM of procedure/1/2 usual dose HS prior to procedure  Farxiga /Chlorthalidone /Spironolactone -AM of procedure -Other usual morning medications can be taken including aspirin  81 mg and Plavix  75 mg  Trulicity  weekly-has not taken in at least a week.  Plan to go home the same day, you will only stay overnight if medically necessary.  You must have responsible adult to drive you home.  Someone must be with you the first 24 hours after you arrive home.  Reviewed procedure instructions with patient.

## 2024-03-26 ENCOUNTER — Ambulatory Visit (HOSPITAL_COMMUNITY)
Admission: RE | Admit: 2024-03-26 | Discharge: 2024-03-27 | Disposition: A | Attending: Cardiology | Admitting: Cardiology

## 2024-03-26 ENCOUNTER — Other Ambulatory Visit (HOSPITAL_COMMUNITY): Payer: Self-pay

## 2024-03-26 ENCOUNTER — Encounter (HOSPITAL_COMMUNITY): Admission: RE | Disposition: A | Payer: Self-pay | Attending: Cardiology

## 2024-03-26 ENCOUNTER — Other Ambulatory Visit: Payer: Self-pay

## 2024-03-26 ENCOUNTER — Encounter (HOSPITAL_COMMUNITY): Payer: Self-pay | Admitting: Cardiology

## 2024-03-26 DIAGNOSIS — E7849 Other hyperlipidemia: Secondary | ICD-10-CM | POA: Insufficient documentation

## 2024-03-26 DIAGNOSIS — I25118 Atherosclerotic heart disease of native coronary artery with other forms of angina pectoris: Secondary | ICD-10-CM | POA: Insufficient documentation

## 2024-03-26 DIAGNOSIS — Z7985 Long-term (current) use of injectable non-insulin antidiabetic drugs: Secondary | ICD-10-CM | POA: Insufficient documentation

## 2024-03-26 DIAGNOSIS — Z7984 Long term (current) use of oral hypoglycemic drugs: Secondary | ICD-10-CM | POA: Insufficient documentation

## 2024-03-26 DIAGNOSIS — E669 Obesity, unspecified: Secondary | ICD-10-CM | POA: Insufficient documentation

## 2024-03-26 DIAGNOSIS — Z7982 Long term (current) use of aspirin: Secondary | ICD-10-CM | POA: Insufficient documentation

## 2024-03-26 DIAGNOSIS — Z794 Long term (current) use of insulin: Secondary | ICD-10-CM | POA: Insufficient documentation

## 2024-03-26 DIAGNOSIS — Z7902 Long term (current) use of antithrombotics/antiplatelets: Secondary | ICD-10-CM | POA: Insufficient documentation

## 2024-03-26 DIAGNOSIS — E1169 Type 2 diabetes mellitus with other specified complication: Secondary | ICD-10-CM | POA: Diagnosis present

## 2024-03-26 DIAGNOSIS — Z6841 Body Mass Index (BMI) 40.0 and over, adult: Secondary | ICD-10-CM | POA: Insufficient documentation

## 2024-03-26 DIAGNOSIS — I10 Essential (primary) hypertension: Secondary | ICD-10-CM | POA: Diagnosis present

## 2024-03-26 DIAGNOSIS — I2582 Chronic total occlusion of coronary artery: Secondary | ICD-10-CM

## 2024-03-26 DIAGNOSIS — Z955 Presence of coronary angioplasty implant and graft: Secondary | ICD-10-CM

## 2024-03-26 DIAGNOSIS — Z833 Family history of diabetes mellitus: Secondary | ICD-10-CM | POA: Insufficient documentation

## 2024-03-26 DIAGNOSIS — Z87891 Personal history of nicotine dependence: Secondary | ICD-10-CM | POA: Insufficient documentation

## 2024-03-26 DIAGNOSIS — I2584 Coronary atherosclerosis due to calcified coronary lesion: Secondary | ICD-10-CM | POA: Insufficient documentation

## 2024-03-26 DIAGNOSIS — Z8249 Family history of ischemic heart disease and other diseases of the circulatory system: Secondary | ICD-10-CM | POA: Insufficient documentation

## 2024-03-26 DIAGNOSIS — Z79899 Other long term (current) drug therapy: Secondary | ICD-10-CM | POA: Insufficient documentation

## 2024-03-26 DIAGNOSIS — E11319 Type 2 diabetes mellitus with unspecified diabetic retinopathy without macular edema: Secondary | ICD-10-CM | POA: Diagnosis present

## 2024-03-26 DIAGNOSIS — I209 Angina pectoris, unspecified: Secondary | ICD-10-CM | POA: Diagnosis present

## 2024-03-26 DIAGNOSIS — I25119 Atherosclerotic heart disease of native coronary artery with unspecified angina pectoris: Secondary | ICD-10-CM

## 2024-03-26 HISTORY — PX: CORONARY CTO INTERVENTION: CATH118236

## 2024-03-26 HISTORY — PX: CORONARY ULTRASOUND/IVUS: CATH118244

## 2024-03-26 LAB — CBC
HCT: 42.7 % (ref 39.0–52.0)
Hemoglobin: 14.5 g/dL (ref 13.0–17.0)
MCH: 30.3 pg (ref 26.0–34.0)
MCHC: 34 g/dL (ref 30.0–36.0)
MCV: 89.3 fL (ref 80.0–100.0)
Platelets: 198 K/uL (ref 150–400)
RBC: 4.78 MIL/uL (ref 4.22–5.81)
RDW: 12.6 % (ref 11.5–15.5)
WBC: 4.6 K/uL (ref 4.0–10.5)
nRBC: 0 % (ref 0.0–0.2)

## 2024-03-26 LAB — POCT ACTIVATED CLOTTING TIME
Activated Clotting Time: 266 s
Activated Clotting Time: 414 s
Activated Clotting Time: 435 s
Activated Clotting Time: 209 s
Activated Clotting Time: 327 s
Activated Clotting Time: 194 s
Activated Clotting Time: 173 s
Activated Clotting Time: 184 s

## 2024-03-26 LAB — GLUCOSE, CAPILLARY
Glucose-Capillary: 65 mg/dL — ABNORMAL LOW (ref 70–99)
Glucose-Capillary: 112 mg/dL — ABNORMAL HIGH (ref 70–99)
Glucose-Capillary: 82 mg/dL (ref 70–99)
Glucose-Capillary: 148 mg/dL — ABNORMAL HIGH (ref 70–99)
Glucose-Capillary: 97 mg/dL (ref 70–99)
Glucose-Capillary: 92 mg/dL (ref 70–99)
Glucose-Capillary: 219 mg/dL — ABNORMAL HIGH (ref 70–99)

## 2024-03-26 LAB — HEMOGLOBIN A1C
Hgb A1c MFr Bld: 5.6 % (ref 4.8–5.6)
Mean Plasma Glucose: 114.02 mg/dL

## 2024-03-26 LAB — CREATININE, SERUM
Creatinine, Ser: 1 mg/dL (ref 0.61–1.24)
GFR, Estimated: >60 mL/min (ref >60)

## 2024-03-26 SURGERY — CORONARY CTO INTERVENTION
Anesthesia: LOCAL

## 2024-03-26 MED ORDER — SODIUM CHLORIDE 0.9% FLUSH: 3.0000 mL | INTRAVENOUS | Status: DC | PRN

## 2024-03-26 MED ORDER — NITROGLYCERIN 1 MG/10 ML FOR IR/CATH LAB
INTRA_ARTERIAL | Status: AC
  Filled 2024-03-26: qty 10

## 2024-03-26 MED ORDER — FENTANYL CITRATE (PF) 100 MCG/2ML IJ SOLN
INTRAMUSCULAR | Status: AC
  Filled 2024-03-26: qty 2

## 2024-03-26 MED ORDER — ACETAMINOPHEN 325 MG PO TABS: 650.0000 mg | ORAL_TABLET | ORAL | Status: DC | PRN

## 2024-03-26 MED ORDER — BRIMONIDINE TARTRATE 0.2 % OP SOLN
1.0000 [drp] | Freq: Two times a day (BID) | OPHTHALMIC | Status: DC
  Administered 2024-03-26 – 2024-03-27 (×2): 1 [drp] via OPHTHALMIC
  Filled 2024-03-26: qty 5

## 2024-03-26 MED ORDER — CHLORHEXIDINE GLUCONATE CLOTH 2 % EX PADS
6.0000 | MEDICATED_PAD | Freq: Once | CUTANEOUS | Status: AC
  Administered 2024-03-26: 6 via TOPICAL

## 2024-03-26 MED ORDER — NITROGLYCERIN 1 MG/10 ML FOR IR/CATH LAB
INTRA_ARTERIAL | Status: DC | PRN
  Administered 2024-03-26 (×3): 200 ug via INTRACORONARY

## 2024-03-26 MED ORDER — FENTANYL CITRATE (PF) 100 MCG/2ML IJ SOLN
INTRAMUSCULAR | Status: DC | PRN
  Administered 2024-03-26 (×2): 25 ug via INTRAVENOUS

## 2024-03-26 MED ORDER — MIDAZOLAM HCL 2 MG/2ML IJ SOLN
INTRAMUSCULAR | Status: AC
  Filled 2024-03-26: qty 2

## 2024-03-26 MED ORDER — INSULIN ASPART 100 UNIT/ML IJ SOLN: 0.0000 [IU] | Freq: Three times a day (TID) | INTRAMUSCULAR | Status: DC

## 2024-03-26 MED ORDER — FREE WATER: 500.0000 mL | Freq: Once | Status: DC

## 2024-03-26 MED ORDER — HYDRALAZINE HCL 20 MG/ML IJ SOLN: 10.0000 mg | INTRAMUSCULAR | Status: AC | PRN

## 2024-03-26 MED ORDER — DEXTROSE 50 % IV SOLN
25.0000 mL | Freq: Once | INTRAVENOUS | Status: AC
  Administered 2024-03-26: 25 mL via INTRAVENOUS

## 2024-03-26 MED ORDER — SODIUM CHLORIDE 0.9 % IV SOLN: 250.0000 mL | INTRAVENOUS | Status: AC | PRN

## 2024-03-26 MED ORDER — SODIUM CHLORIDE 0.9% FLUSH: 3.0000 mL | Freq: Two times a day (BID) | INTRAVENOUS | Status: DC

## 2024-03-26 MED ORDER — SODIUM CHLORIDE 0.9 % IV SOLN: 250.0000 mL | INTRAVENOUS | Status: DC | PRN

## 2024-03-26 MED ORDER — FREE WATER
500.0000 mL | Freq: Once | Status: AC
  Administered 2024-03-26: 500 mL via ORAL

## 2024-03-26 MED ORDER — CLOPIDOGREL BISULFATE 75 MG PO TABS
75.0000 mg | ORAL_TABLET | Freq: Every day | ORAL | Status: DC
  Administered 2024-03-27: 75 mg via ORAL
  Filled 2024-03-26 (×2): qty 1

## 2024-03-26 MED ORDER — SODIUM CHLORIDE 0.9% FLUSH
3.0000 mL | Freq: Two times a day (BID) | INTRAVENOUS | Status: DC
  Administered 2024-03-26 – 2024-03-27 (×2): 3 mL via INTRAVENOUS

## 2024-03-26 MED ORDER — PANTOPRAZOLE SODIUM 40 MG PO TBEC
40.0000 mg | DELAYED_RELEASE_TABLET | Freq: Every day | ORAL | Status: DC
  Administered 2024-03-26 – 2024-03-27 (×2): 40 mg via ORAL
  Filled 2024-03-26 (×2): qty 1

## 2024-03-26 MED ORDER — HEPARIN SODIUM (PORCINE) 1000 UNIT/ML IJ SOLN
INTRAMUSCULAR | Status: DC | PRN
  Administered 2024-03-26: 10000 [IU] via INTRAVENOUS
  Administered 2024-03-26: 2000 [IU] via INTRAVENOUS

## 2024-03-26 MED ORDER — ONDANSETRON HCL 4 MG/2ML IJ SOLN: 4.0000 mg | Freq: Four times a day (QID) | INTRAMUSCULAR | Status: DC | PRN

## 2024-03-26 MED ORDER — IOHEXOL 350 MG/ML SOLN
INTRAVENOUS | Status: DC | PRN
  Administered 2024-03-26: 175 mL

## 2024-03-26 MED ORDER — HEPARIN (PORCINE) IN NACL 1000-0.9 UT/500ML-% IV SOLN
INTRAVENOUS | Status: DC | PRN
  Administered 2024-03-26 (×2): 500 mL

## 2024-03-26 MED ORDER — ACETAMINOPHEN 500 MG PO TABS: 1000.0000 mg | ORAL_TABLET | ORAL | Status: DC

## 2024-03-26 MED ORDER — CHLORTHALIDONE 25 MG PO TABS
25.0000 mg | ORAL_TABLET | Freq: Every day | ORAL | Status: DC
  Administered 2024-03-27: 25 mg via ORAL
  Filled 2024-03-26: qty 1

## 2024-03-26 MED ORDER — ASPIRIN 81 MG PO TBEC
81.0000 mg | DELAYED_RELEASE_TABLET | Freq: Every day | ORAL | Status: DC
  Administered 2024-03-27: 81 mg via ORAL
  Filled 2024-03-26 (×2): qty 1

## 2024-03-26 MED ORDER — DEXTROSE 50 % IV SOLN
INTRAVENOUS | Status: DC | PRN
  Administered 2024-03-26: 25 g via INTRAVENOUS

## 2024-03-26 MED ORDER — MIDAZOLAM HCL (PF) 2 MG/2ML IJ SOLN
INTRAMUSCULAR | Status: DC | PRN
  Administered 2024-03-26: 2 mg via INTRAVENOUS
  Administered 2024-03-26: 1 mg via INTRAVENOUS

## 2024-03-26 MED ORDER — LATANOPROST 0.005 % OP SOLN
1.0000 [drp] | Freq: Every day | OPHTHALMIC | Status: DC
  Administered 2024-03-26: 1 [drp] via OPHTHALMIC
  Filled 2024-03-26: qty 2.5

## 2024-03-26 MED ORDER — ATORVASTATIN CALCIUM 40 MG PO TABS
40.0000 mg | ORAL_TABLET | Freq: Every day | ORAL | Status: DC
  Administered 2024-03-26 – 2024-03-27 (×2): 40 mg via ORAL
  Filled 2024-03-26 (×2): qty 1

## 2024-03-26 MED ORDER — ENSURE PRE-SURGERY PO LIQD
296.0000 mL | Freq: Once | ORAL | Status: DC
  Filled 2024-03-26: qty 296

## 2024-03-26 MED ORDER — DAPAGLIFLOZIN PROPANEDIOL 10 MG PO TABS
10.0000 mg | ORAL_TABLET | Freq: Every day | ORAL | Status: DC
  Administered 2024-03-26 – 2024-03-27 (×2): 10 mg via ORAL
  Filled 2024-03-26 (×2): qty 1

## 2024-03-26 MED ORDER — SPIRONOLACTONE 25 MG PO TABS
50.0000 mg | ORAL_TABLET | Freq: Every day | ORAL | Status: DC
  Administered 2024-03-27: 50 mg via ORAL
  Filled 2024-03-26: qty 2

## 2024-03-26 MED ORDER — LIDOCAINE HCL (PF) 1 % IJ SOLN
INTRAMUSCULAR | Status: DC | PRN
  Administered 2024-03-26: 5 mL

## 2024-03-26 MED ORDER — ENOXAPARIN SODIUM 60 MG/0.6ML IJ SOSY
60.0000 mg | PREFILLED_SYRINGE | INTRAMUSCULAR | Status: DC
  Administered 2024-03-27: 60 mg via SUBCUTANEOUS
  Filled 2024-03-26: qty 0.6

## 2024-03-26 MED ORDER — HEPARIN SODIUM (PORCINE) 1000 UNIT/ML IJ SOLN
INTRAMUSCULAR | Status: AC
  Filled 2024-03-26: qty 10

## 2024-03-26 MED ORDER — CARVEDILOL 25 MG PO TABS
25.0000 mg | ORAL_TABLET | Freq: Two times a day (BID) | ORAL | Status: DC
  Administered 2024-03-26 – 2024-03-27 (×2): 25 mg via ORAL
  Filled 2024-03-26 (×2): qty 1

## 2024-03-26 MED ORDER — AMLODIPINE BESYLATE 5 MG PO TABS
10.0000 mg | ORAL_TABLET | Freq: Every day | ORAL | Status: DC
  Administered 2024-03-27: 10 mg via ORAL
  Filled 2024-03-26 (×2): qty 2

## 2024-03-26 MED ORDER — DEXTROSE 50 % IV SOLN
INTRAVENOUS | Status: AC
  Filled 2024-03-26: qty 50

## 2024-03-26 MED ORDER — INSULIN GLARGINE 100 UNIT/ML ~~LOC~~ SOLN
30.0000 [IU] | Freq: Two times a day (BID) | SUBCUTANEOUS | Status: DC
  Administered 2024-03-26 – 2024-03-27 (×2): 30 [IU] via SUBCUTANEOUS
  Filled 2024-03-26 (×3): qty 0.3

## 2024-03-26 MED ADMIN — Hydralazine HCl Tab 50 MG: 100 mg | ORAL | NDC 23155083401

## 2024-03-26 MED FILL — Hydralazine HCl Tab 50 MG: 100.0000 mg | ORAL | Qty: 2 | Status: AC

## 2024-03-26 SURGICAL SUPPLY — 29 items
BALLOON EMERGE MR 2.0X15 (BALLOONS) IMPLANT
BALLOON EMERGE MR 2.5X15 (BALLOONS) IMPLANT
BALLOON EMERGE MR 3.0X15 (BALLOONS) IMPLANT
BALLOON SAPPHIRE NC24 3.0X12 (BALLOONS) IMPLANT
BALLOON ~~LOC~~ EMERGE MR 3.5X20 (BALLOONS) IMPLANT
CATH GUIDELINER COAST (CATHETERS) IMPLANT
CATH MAMBA 135 (CATHETERS) IMPLANT
CATH VISTA GUIDE 6FR XB4 MULPK (CATHETERS) IMPLANT
CATH VISTA GUIDE 6FR XBLD 3.5 (CATHETERS) IMPLANT
DRAPE IVUS SLED (BAG) IMPLANT
KIT ENCORE 26 ADVANTAGE (KITS) IMPLANT
KIT HEART LEFT (KITS) IMPLANT
KIT HEMO VALVE WATCHDOG (MISCELLANEOUS) IMPLANT
KIT MICROPUNCTURE NIT STIFF (SHEATH) IMPLANT
KIT SYRINGE INJ CVI SPIKEX1 (MISCELLANEOUS) IMPLANT
PACK CARDIAC CATHETERIZATION (CUSTOM PROCEDURE TRAY) ×1 IMPLANT
SET ATX-X65L (MISCELLANEOUS) IMPLANT
SHEATH INTRO PINNACLE 7F 25CM (SHEATH) IMPLANT
SHEATH PINNACLE 7F 10CM (SHEATH) IMPLANT
SHEATH PROBE COVER 6X72 (BAG) IMPLANT
SLED PULL BACK IVUS (MISCELLANEOUS) IMPLANT
STENT SYNERGY XD 2.50X32 (Permanent Stent) IMPLANT
STENT SYNERGY XD 3.0X48 (Permanent Stent) IMPLANT
TUBING CIL FLEX 10 FLL-RA (TUBING) IMPLANT
WIRE ASAHI PROWATER 180CM (WIRE) IMPLANT
WIRE ASAHI PROWATER 300CM (WIRE) IMPLANT
WIRE EMERALD 3MM-J .035X150CM (WIRE) IMPLANT
WIRE FIGHTER CROSSING 190CM (WIRE) IMPLANT
WIRE RUNTHROUGH .014X180CM (WIRE) IMPLANT

## 2024-03-26 NOTE — Discharge Instructions (Signed)
Post Cardiac Catheterization: NO HEAVY LIFTING OR SEXUAL ACTIVITY X 7 DAYS. NO DRIVING X 3-5 DAYS. NO SOAKING BATHS, HOT TUBS, POOLS, ETC., X 7 DAYS.  Groin Site Care Refer to this sheet in the next few weeks. These instructions provide you with information on caring for yourself after your procedure. Your caregiver may also give you more specific instructions. Your treatment has been planned according to current medical practices, but problems sometimes occur. Call your caregiver if you have any problems or questions after your procedure. HOME CARE INSTRUCTIONS You may shower 24 hours after the procedure. Remove the bandage (dressing) and gently wash the site with plain soap and water. Gently pat the site dry.  Do not apply powder or lotion to the site.  Do not sit in a bathtub, swimming pool, or whirlpool for 5 to 7 days.  No bending, squatting, or lifting anything over 10 pounds (4.5 kg) as directed by your caregiver.  Inspect the site at least twice daily.  Do not drive home if you are discharged the same day of the procedure. Have someone else drive you.  What to expect: Any bruising will usually fade within 1 to 2 weeks.  Blood that collects in the tissue (hematoma) may be painful to the touch. It should usually decrease in size and tenderness within 1 to 2 weeks.  SEEK IMMEDIATE MEDICAL CARE IF: You have unusual pain at the groin site or down the affected leg.  You have redness, warmth, swelling, or pain at the groin site.  You have drainage (other than a small amount of blood on the dressing).  You have chills.  You have a fever or persistent symptoms for more than 72 hours.  You have a fever and your symptoms suddenly get worse.  Your leg becomes pale, cool, tingly, or numb.  You have heavy bleeding from the site. Hold pressure on the site.   

## 2024-03-26 NOTE — Plan of Care (Signed)
   Problem: Education: Goal: Understanding of CV disease, CV risk reduction, and recovery process will improve Outcome: Progressing

## 2024-03-26 NOTE — Interval H&P Note (Signed)
 History and Physical Interval Note:  03/26/2024 7:01 AM  Justin Houston  has presented today for surgery, with the diagnosis of CAD.  The various methods of treatment have been discussed with the patient and family. After consideration of risks, benefits and other options for treatment, the patient has consented to  Procedures: CORONARY CTO INTERVENTION (N/A) as a surgical intervention.  The patient's history has been reviewed, patient examined, no change in status, stable for surgery.  I have reviewed the patient's chart and labs.  Questions were answered to the patient's satisfaction.   Cath Lab Visit (complete for each Cath Lab visit)  Clinical Evaluation Leading to the Procedure:   ACS: No.  Non-ACS:    Anginal Classification: CCS III  Anti-ischemic medical therapy: Maximal Therapy (2 or more classes of medications)  Non-Invasive Test Results: No non-invasive testing performed  Prior CABG: No previous CABG        [facility] 03/26/2024 7:01 AM

## 2024-03-26 NOTE — Discharge Summary (Signed)
 Discharge Summary   Patient ID: Justin Houston MRN: [id number]; DOB: July 23, 1965  Admit date: 03/26/2024 Discharge date: 03/27/2024  PCP:  Vicci Barnie NOVAK, MD   [facility] Providers Cardiologist:  None     Discharge Diagnoses  Principal Problem:   Angina pectoris Active Problems:   CAD (coronary artery disease)   Hypertension   Diabetes mellitus type 2 with retinopathy (HCC)   Hyperlipidemia associated with type 2 diabetes mellitus (HCC)   Diagnostic Studies/Procedures   CTO Intervention 03/26/2024:   Prox LAD to Mid LAD lesion is 80% stenosed.   Mid LAD lesion is 100% stenosed.   A drug-eluting stent was successfully placed using a STENT SYNERGY XD 3.0X48.   Post intervention, there is a 0% residual stenosis.   Prox Cx lesion is 90% stenosed.   A drug-eluting stent was successfully placed using a STENT SYNERGY XD 2.50X32.   Post intervention, there is a 0% residual stenosis.   1st Mrg-1 lesion is 100% stenosed.   1st Mrg-2 lesion is 60% stenosed.   A drug-eluting stent was successfully placed using a STENT SYNERGY XD 2.50X32.   Post intervention, there is a 0% residual stenosis.   Post intervention, there is a 0% residual stenosis.   Post intervention, there is a 0% residual stenosis.   In the absence of any other complications or medical issues, we expect the patient to be ready for discharge from an interventional cardiology perspective on 03/27/2024.   Recommend dual antiplatelet therapy with Aspirin  81mg  daily and Clopidogrel  75mg  daily long-term (beyond 12 months) because of multiple long stents.   Successful CTO PCI of the proximal to mid LAD with 3.0 x 48 mm Synergy stent. IVUS guidance. Successful CTO PCI of the first OM with 2.5 x 32 mm Synergy stent Successful PCI of the proximal LCx with 2.5 x 32 mm Synergy stent   Plan: DAPT indefinitely given multiple long stents. Will observe overnight and plan DC in am. Continue antianginal therapy for  residual diffuse disease in the RCA and mid to distal LAD.  Diagnostic Dominance: Right  Intervention      _____________   History of Present Illness   Justin Houston is a 58 y.o. male with a history of severe multivessel CAD, hypertension, hyperlipidemia, type 2 diabetes mellitus, and obesity. He was referred to Dr. Lavona in 02/2024 for further evaluation of exertional chest pain. EKG showed a probably old anteroseptal infarct and T wave inversions. Symptoms were concerned for unstable angina. Therefore, he underwent a cardiac catheterization on 03/02/2024 which showed severe multivessel CAD with 20% stenosis of distal left main, 80% stenosis followed by subtotal occlusion of the mid LAD and then 80% stenosis of mid to distal LAD, 90% stenosis of proximal LCx, subtotal occlusion of OM1, diffuse 80% stenosis of proximal to mid RCA followed by 70% stenosis of distal RCA and 90% stenosis of RPDA. Echo showed LVEF of 50% with inferior hypokinesis. Case was reviewed by Heart Team and he was felt to be a poor candidate for CABG due to poor targets. He was seen by Dr. Jordan on 03/16/2024 and cardiac catheterization with CTO PCI was recommended.  Hospital Course   Consultants: None.   Patient presented to [facility] 03/26/2024 for planned CTO intervention. He underwent successful CTO  PCI with DES to proximal to mid LAD, successful CTO PCI with DES to OM1, and successful PCI with DES to proximal LCx. He tolerated the procedure well but was kept overnight  for observation. Plan is for DAPT with Aspirin  81mg  and Plavix  75mg  daily indefinitely given multiple long stents. He is already on a high-intensity statin. Will stop Omeprazole  given interaction with Plavix  and provide a prescription of Protonix  instead. Continue all other home medications. Instructions/precautions regarding cath site care were given prior to discharge.   Patient was seen by Dr. Nancey today and felt to be stable for  discharge. Outpatient follow-up arranged. Medications as below.   Did the patient have an acute coronary syndrome (MI, NSTEMI, STEMI, etc) this admission?:  No                               Did the patient have a percutaneous coronary intervention (stent / angioplasty)?:  Yes.     Cath/PCI Registry Performance & Quality Measures: Aspirin  prescribed? - Yes ADP Receptor Inhibitor (Plavix /Clopidogrel , Brilinta/Ticagrelor or Effient/Prasugrel) prescribed (includes medically managed patients)? - Yes High Intensity Statin (Lipitor 40-80mg  or Crestor 20-40mg ) prescribed? - Yes For EF <40%, was ACEI/ARB prescribed? - Not Applicable (EF >/= 40%) For EF <40%, Aldosterone Antagonist (Spironolactone  or Eplerenone) prescribed? - Not Applicable (EF >/= 40%) Cardiac Rehab Phase II ordered? - Yes   _____________  Discharge Vitals Blood pressure 137/78, pulse 75, temperature 98.2 F (36.8 C), temperature source Tympanic, resp. rate 18, height 5' 11 (1.803 m), weight (!) 140.6 kg, SpO2 100%.  Filed Weights   03/26/24 0554  Weight: (!) 140.6 kg    Labs & Radiologic Studies  CBC Recent Labs    03/26/24 1458 03/27/24 0330  WBC 4.6 5.4  HGB 14.5 14.0  HCT 42.7 40.8  MCV 89.3 88.9  PLT 198 193   Basic Metabolic Panel Recent Labs    87/87/74 1458 03/27/24 0330  NA  --  139  K  --  3.7  CL  --  106  CO2  --  23  GLUCOSE  --  77  BUN  --  19  CREATININE 1.00 1.03  CALCIUM   --  9.0   Liver Function Tests No results for input(s): AST, ALT, ALKPHOS, BILITOT, PROT, ALBUMIN in the last 72 hours. No results for input(s): LIPASE, AMYLASE in the last 72 hours. High Sensitivity Troponin:   No results for input(s): TROPONINIHS in the last 720 hours.  No results for input(s): TRNPT in the last 720 hours.  BNP Invalid input(s): POCBNP No results for input(s): PROBNP in the last 72 hours.  No results for input(s): BNP in the last 72 hours.  D-Dimer No results for  input(s): DDIMER in the last 72 hours. Hemoglobin A1C Recent Labs    03/26/24 1458  HGBA1C 5.6   Fasting Lipid Panel No results for input(s): CHOL, HDL, LDLCALC, TRIG, CHOLHDL, LDLDIRECT in the last 72 hours. No results found for: LIPOA  Thyroid Function Tests No results for input(s): TSH, T4TOTAL, T3FREE, THYROIDAB in the last 72 hours.  Invalid input(s): FREET3 _____________  CARDIAC CATHETERIZATION Result Date: 03/26/2024   Prox LAD to Mid LAD lesion is 80% stenosed.   Mid LAD lesion is 100% stenosed.   A drug-eluting stent was successfully placed using a STENT SYNERGY XD 3.0X48.   Post intervention, there is a 0% residual stenosis.   Prox Cx lesion is 90% stenosed.   A drug-eluting stent was successfully placed using a STENT SYNERGY XD 2.50X32.   Post intervention, there is a 0% residual stenosis.   1st Mrg-1 lesion is 100% stenosed.  1st Mrg-2 lesion is 60% stenosed.   A drug-eluting stent was successfully placed using a STENT SYNERGY XD 2.50X32.   Post intervention, there is a 0% residual stenosis.   Post intervention, there is a 0% residual stenosis.   Post intervention, there is a 0% residual stenosis.   In the absence of any other complications or medical issues, we expect the patient to be ready for discharge from an interventional cardiology perspective on 03/27/2024.   Recommend dual antiplatelet therapy with Aspirin  81mg  daily and Clopidogrel  75mg  daily long-term (beyond 12 months) because of multiple long stents. Successful CTO PCI of the proximal to mid LAD with 3.0 x 48 mm Synergy stent. IVUS guidance. Successful CTO PCI of the first OM with 2.5 x 32 mm Synergy stent Successful PCI of the proximal LCx with 2.5 x 32 mm Synergy stent Plan: DAPT indefinitely given multiple long stents. Will observe overnight and plan DC in am. Continue antianginal therapy for residual diffuse disease in the RCA and mid to distal LAD   ECHOCARDIOGRAM COMPLETE Result Date:  03/03/2024    ECHOCARDIOGRAM REPORT   Patient Name:   TYLER CUBIT Date of Exam: 03/03/2024 Medical Rec #:  [id number]        Height:       71.0 in Accession #:    [id number]       Weight:       310.0 lb Date of Birth:  [birth year]        BSA:          2.540 m Patient Age:    58 years         BP:           121/71 mmHg Patient Gender: M                HR:           64 bpm. Exam Location:  Outpatient Procedure: 2D Echo, Cardiac Doppler, Color Doppler and Strain Analysis (Both            Spectral and Color Flow Doppler were utilized during procedure). Indications:    Chest Pain R07.9  History:        Patient has no prior history of Echocardiogram examinations.                 Signs/Symptoms:Chest Pain; Risk Factors:Hypertension, Diabetes,                 Dyslipidemia and Former Smoker.  Sonographer:    Koleen Popper RDCS Referring Phys: [address]  Sonographer Comments: Patient is obese. Image acquisition challenging due to patient body habitus. Global longitudinal strain was attempted. IMPRESSIONS  1. Left ventricular ejection fraction, by estimation, is 50%. Left ventricular ejection fraction by 2D MOD biplane is 47.9 %. The left ventricle has mildly decreased function. The left ventricle demonstrates regional wall motion abnormalities (see scoring diagram/findings for description). There is moderate concentric left ventricular hypertrophy. Left ventricular diastolic parameters are consistent with Grade I diastolic dysfunction (impaired relaxation). The average left ventricular global longitudinal strain is -12.4 %. The global longitudinal strain is abnormal.  2. Right ventricular systolic function is normal. The right ventricular size is normal. Tricuspid regurgitation signal is inadequate for assessing PA pressure.  3. The mitral valve is normal in structure. Trivial mitral valve regurgitation. No evidence of mitral stenosis.  4. The aortic valve is tricuspid. There is trivial aortic regurgitation.  There is no aortic valve stenosis. There is a  small echodensity on the non-coronary cusp of the aortic valve favored to be nodular calcification.  5. The inferior vena cava is normal in size with greater than 50% respiratory variability, suggesting right atrial pressure of 3 mmHg. Comparison(s): No prior Echocardiogram. Conclusion(s)/Recommendation(s): Mildly reduced LV systolic function with an LVEF = 50%. Normal RV systolic function. No significant valvular disease. FINDINGS  Left Ventricle: Left ventricular ejection fraction, by estimation, is 50%. Left ventricular ejection fraction by 2D MOD biplane is 47.9 %. The left ventricle has mildly decreased function. The left ventricle demonstrates regional wall motion abnormalities. The average left ventricular global longitudinal strain is -12.4 %. Strain was performed and the global longitudinal strain is abnormal. The left ventricular internal cavity size was normal in size. There is moderate concentric left ventricular hypertrophy. Left ventricular diastolic parameters are consistent with Grade I diastolic dysfunction (impaired relaxation).  LV Wall Scoring: The entire inferior wall, mid inferoseptal segment, and basal inferoseptal segment are hypokinetic. Right Ventricle: The right ventricular size is normal. No increase in right ventricular wall thickness. Right ventricular systolic function is normal. Tricuspid regurgitation signal is inadequate for assessing PA pressure. Left Atrium: Left atrial size was normal in size. Right Atrium: Right atrial size was normal in size. Pericardium: There is no evidence of pericardial effusion. Mitral Valve: The mitral valve is normal in structure. Trivial mitral valve regurgitation. No evidence of mitral valve stenosis. Tricuspid Valve: The tricuspid valve is normal in structure. Tricuspid valve regurgitation is trivial. No evidence of tricuspid stenosis. Aortic Valve: The aortic valve is tricuspid. There is trivial aortic  regurgitation. There is no aortic valve stenosis. There is a small echodensity on the non-coronary cusp of the aortic valve favored to be nodular calcification. Pulmonic Valve: The pulmonic valve was normal in structure. Pulmonic valve regurgitation is not visualized. No evidence of pulmonic stenosis. Aorta: The aortic root and ascending aorta are structurally normal, with no evidence of dilitation. Venous: The inferior vena cava is normal in size with greater than 50% respiratory variability, suggesting right atrial pressure of 3 mmHg. IAS/Shunts: No atrial level shunt detected by color flow Doppler.  LEFT VENTRICLE PLAX 2D                        Biplane EF (MOD) LVIDd:         4.50 cm         LV Biplane EF:   Left LVIDs:         3.00 cm                          ventricular LV PW:         1.40 cm                          ejection LV IVS:        1.60 cm                          fraction by LVOT diam:     2.50 cm                          2D MOD LV SV:         73  biplane is LV SV Index:   29                               47.9 %. LVOT Area:     4.91 cm                                Diastology                                LV e' medial:    3.81 cm/s LV Volumes (MOD)               LV E/e' medial:  9.7 LV vol d, MOD    219.0 ml      LV e' lateral:   7.40 cm/s A2C:                           LV E/e' lateral: 5.0 LV vol d, MOD    276.0 ml A4C:                           2D Longitudinal LV vol s, MOD    111.0 ml      Strain A2C:                           2D Strain GLS   -12.4 % LV vol s, MOD    148.0 ml      Avg: A4C: LV SV MOD A2C:   108.0 ml LV SV MOD A4C:   276.0 ml LV SV MOD BP:    123.4 ml RIGHT VENTRICLE          IVC RV Basal diam:  3.80 cm  IVC diam: 2.00 cm LEFT ATRIUM             Index        RIGHT ATRIUM          Index LA diam:        4.60 cm 1.81 cm/m   RA Area:     9.55 cm LA Vol (A2C):   50.9 ml 20.04 ml/m  RA Volume:   18.20 ml 7.16 ml/m LA Vol (A4C):   66.0 ml 25.98 ml/m LA  Biplane Vol: 58.4 ml 22.99 ml/m  AORTIC VALVE LVOT Vmax:   71.60 cm/s LVOT Vmean:  45.800 cm/s LVOT VTI:    0.149 m  AORTA Ao Root diam: 3.80 cm Ao Asc diam:  3.60 cm MITRAL VALVE MV Area (PHT): 1.82 cm    SHUNTS MV Decel Time: 417 msec    Systemic VTI:  0.15 m MV E velocity: 36.80 cm/s  Systemic Diam: 2.50 cm MV A velocity: 72.40 cm/s MV E/A ratio:  0.51 Georganna Archer Electronically signed by Georganna Archer Signature Date/Time: 03/03/2024/3:20:15 PM    Final    CARDIAC CATHETERIZATION Result Date: 03/02/2024 Images from the original result were not included. Coronary angiography 03/02/2024: LM: Distal 20% stenosis LAD: Mid 80% calcific stenosis, followed by subtotal calcific occlusion of mid LAD          Mid to distal LAD diffuse 80% calcific disease          Large diag 2 with diffuse 60% disease Lcx: Proximal  90% stenosis         Subtotal occlusion of OM1 RCA: Diffuse 80% calcific prox-mid stenosis            Distal 70% disease           RPDA focal 90% stenosis Collateralization from RCA to LAD/septals, diagonal-LAD and OM1 LVEDP 24 mmHg Conclusion: Severe multivessel coronary artery disease I am doubtful about CABG targets to LAD and RPDA, diag, OM could be targets Percutaneous intervention unlikely to feasible and beneficial Overall, very difficult anatomy with severe disease Will discuss in multidisciplinary heart team meeting and refer to see CVTS (unless deemed not to be surgical candidate during Friday meeting) Unusually delayed procedure time due to following reasons: Severe tortuosity in right subclavian artery, right innominate artery, and aorta necessitating use of destination sheath.  In spite of using destination sheath, it was extremely challenging to engage his coronary arteries requiring use of multiple catheters, wires and significant difficulty requiring long procedure time, and fluoroscopy time. Newman JINNY Lawrence, MD   Disposition Patient is being discharged home today in good  condition.  Follow-up Plans & Appointments  Follow-up Information     Jordan, Peter M, MD Follow up.   Specialty: Cardiology Why: Please keep follow-up appointment with Dr. Jordan on 04/26/2024 at 2:20pm. Contact information: [address] [place] [address] [phone]                Discharge Instructions     Increase activity slowly   Complete by: As directed        Discharge Medications Allergies as of 03/27/2024       Reactions   Losartan Potassium-hctz Hives   Cozaar [losartan Potassium] Hives   Hives        Medication List     PAUSE taking these medications    metFORMIN  1000 MG tablet Wait to take this until: March 29, 2024 Commonly known as: GLUCOPHAGE  Take 1 tablet (1,000 mg total) by mouth 2 (two) times daily with a meal.       STOP taking these medications    omeprazole  20 MG capsule Commonly known as: PRILOSEC       TAKE these medications    amLODipine  10 MG tablet Commonly known as: NORVASC  Take 1 tablet (10 mg total) by mouth daily.   aspirin  EC 81 MG tablet Take 1 tablet (81 mg total) by mouth daily. Swallow whole.   atorvastatin  40 MG tablet Commonly known as: LIPITOR Take 1 tablet (40 mg total) by mouth daily.   Blood Pressure Kit Devi Use to measure blood pressure   brimonidine  0.2 % ophthalmic solution Commonly known as: ALPHAGAN  1 drop 2 (two) times daily.   carvedilol  25 MG tablet Commonly known as: COREG  Take 1 tablet (25 mg total) by mouth 2 (two) times daily with a meal.   cetirizine  10 MG tablet Commonly known as: ZyrTEC  Allergy Take 1 tablet (10 mg total) by mouth daily.   chlorthalidone  25 MG tablet Commonly known as: HYGROTON  TAKE 1 TABLET BY MOUTH DAILY.   clobetasol  ointment 0.05 % Commonly known as: TEMOVATE  Apply 1 Application topically 3 (three) times daily as needed (rash.).   clopidogrel  75 MG tablet Commonly known as: PLAVIX  Take 1 tablet (75 mg total) by mouth daily.    dapagliflozin  propanediol 10 MG Tabs tablet Commonly known as: Farxiga  Take 1 tablet (10 mg total) by mouth daily.   fluticasone  50 MCG/ACT nasal spray Commonly known as: FLONASE  Place 2 sprays into  both nostrils daily.   GLUCOSAMINE HCL PO Take 2,000 mg by mouth 2 (two) times daily. With Vitamin D   hydrALAZINE  100 MG tablet Commonly known as: APRESOLINE  TAKE 1 TABLET BY MOUTH 3 TIMES DAILY   insulin  glargine 100 UNIT/ML injection Commonly known as: Lantus  Inject 0.4 mLs (40 Units total) into the skin 2 (two) times daily.   Insulin  Syringe-Needle U-100 31G X 5/16 1 ML Misc Commonly known as: BD Insulin  Syringe U/F Inject 1 each into the skin 2 (two) times daily.   latanoprost  0.005 % ophthalmic solution Commonly known as: XALATAN  Place 1 drop into both eyes at bedtime.   nitroGLYCERIN  0.4 MG SL tablet Commonly known as: NITROSTAT  Place 1 tablet (0.4 mg total) under the tongue every 5 (five) minutes as needed for chest pain.   OneTouch Delica Lancets 33G Misc Use as instructed to check blood sugar 3 times daily. E11.319 Z79.4   OneTouch Verio test strip Generic drug: glucose blood USE AS INSTRUCTED TO CHECK BLOOD SUGAR 3 TIMES DAILY   OneTouch Verio w/Device Kit Use as instructed to check blood sugar 3 times daily. E11.319 Z79.4   pantoprazole  40 MG tablet Commonly known as: PROTONIX  Take 1 tablet (40 mg total) by mouth daily.   spironolactone  50 MG tablet Commonly known as: ALDACTONE  Take 1 tablet (50 mg total) by mouth daily.   Stimulant Laxative 8.6-50 MG tablet Generic drug: senna-docusate TAKE 1 TABLET BY MOUTH DAILY What changed:  when to take this reasons to take this   Trulicity  1.5 MG/0.5ML Soaj Generic drug: Dulaglutide  inject 1.5mg  into THE SKIN ONCE WEEKLY         Outstanding Labs/Studies   Duration of Discharge Encounter: APP Time: 20 minutes   Signed, Damareon Lanni E Kaven Cumbie, PA-C 03/27/2024, 8:54 AM

## 2024-03-26 NOTE — Progress Notes (Signed)
 Site area: RFA x1 Site Prior to Removal:  Level 0 Pressure Applied For: 35 minutes Manual:   yes Patient Status During Pull:  stable Post Pull Site:  Level 0 Post Pull Instructions Given: yes  Post Pull Pulses Present: palpable Dressing Applied:  tegaderm Bedrest begins @ 1315 Comments: Sheath pulled by [employer]

## 2024-03-27 ENCOUNTER — Other Ambulatory Visit (HOSPITAL_COMMUNITY): Payer: Self-pay

## 2024-03-27 ENCOUNTER — Other Ambulatory Visit: Payer: Self-pay

## 2024-03-27 ENCOUNTER — Other Ambulatory Visit: Payer: Self-pay | Admitting: Critical Care Medicine

## 2024-03-27 ENCOUNTER — Encounter (HOSPITAL_COMMUNITY): Payer: Self-pay | Admitting: Cardiology

## 2024-03-27 DIAGNOSIS — I209 Angina pectoris, unspecified: Secondary | ICD-10-CM

## 2024-03-27 DIAGNOSIS — I2510 Atherosclerotic heart disease of native coronary artery without angina pectoris: Secondary | ICD-10-CM | POA: Insufficient documentation

## 2024-03-27 DIAGNOSIS — E1169 Type 2 diabetes mellitus with other specified complication: Secondary | ICD-10-CM

## 2024-03-27 LAB — CBC
HCT: 40.8 % (ref 39.0–52.0)
Hemoglobin: 14 g/dL (ref 13.0–17.0)
MCH: 30.5 pg (ref 26.0–34.0)
MCHC: 34.3 g/dL (ref 30.0–36.0)
MCV: 88.9 fL (ref 80.0–100.0)
Platelets: 193 K/uL (ref 150–400)
RBC: 4.59 MIL/uL (ref 4.22–5.81)
RDW: 12.7 % (ref 11.5–15.5)
WBC: 5.4 K/uL (ref 4.0–10.5)
nRBC: 0 % (ref 0.0–0.2)

## 2024-03-27 LAB — BASIC METABOLIC PANEL WITH GFR
Anion gap: 10 (ref 5–15)
BUN: 19 mg/dL (ref 6–20)
CO2: 23 mmol/L (ref 22–32)
Calcium: 9 mg/dL (ref 8.9–10.3)
Chloride: 106 mmol/L (ref 98–111)
Creatinine, Ser: 1.03 mg/dL (ref 0.61–1.24)
GFR, Estimated: >60 mL/min (ref >60)
Glucose, Bld: 77 mg/dL (ref 70–99)
Potassium: 3.7 mmol/L (ref 3.5–5.1)
Sodium: 139 mmol/L (ref 135–145)

## 2024-03-27 LAB — GLUCOSE, CAPILLARY: Glucose-Capillary: 88 mg/dL (ref 70–99)

## 2024-03-27 MED ORDER — PANTOPRAZOLE SODIUM 40 MG PO TBEC
40.0000 mg | DELAYED_RELEASE_TABLET | Freq: Every day | ORAL | 2 refills | Status: AC
  Filled 2024-03-27: qty 30, 30d supply, fill #0

## 2024-03-27 MED ADMIN — Hydralazine HCl Tab 50 MG: 100 mg | ORAL | NDC 23155083401

## 2024-03-27 NOTE — Progress Notes (Signed)
 Cardiology Office Note:    Date:  03/27/2024   ID:  Justin Houston, DOB 02-07-1966, MRN [id number]  PCP:  Vicci Barnie NOVAK, MD   [facility] Providers Cardiologist:  None     Referring MD: No ref. provider found   No chief complaint on file.   History of Present Illness:    Justin Houston is a 58 y.o. male seen at the request of Dr Elmira for consideration of complex PCI. He has a history of DM, HLD, HTN, obesity.  He was seen in October with one month history of exertional chest pain and SOB. Ecg showed evidence of old anteroseptal infarct and T wave inversions. He underwent cardiac cath on 03/02/24 showing severe multivessel CAD with occlusion of the LAD and first OM with collaterals. Also severe proximal LCx stenosis and severe diffuse stenosis in the RCA and PDA. Echo showed EF 50% with inferior HK. He was presented to our heart team and felt to be a poor candidate for CABG due to poor targets particularly the LAD. He has persistent symptoms despite optimal medical therapy.   Past Medical History:  Diagnosis Date   Dermatitis    Diabetes mellitus    GERD (gastroesophageal reflux disease)    Gout    Hyperlipidemia    Hypertension    Lipoma of scalp    Mass of head, right postauricular SQ 7x4cm 09/29/2013   Obesity    Toenail fungus 08/31/2018    Past Surgical History:  Procedure Laterality Date   COLONOSCOPY WITH PROPOFOL  N/A 12/01/2014   Procedure: COLONOSCOPY WITH PROPOFOL ;  Surgeon: Toribio SHAUNNA Cedar, MD;  Location: [facility];  Service: Endoscopy;  Laterality: N/A;   COLONOSCOPY WITH PROPOFOL  N/A 12/20/2021   Procedure: COLONOSCOPY WITH PROPOFOL ;  Surgeon: Aneita Gwendlyn DASEN, MD;  Location: [facility];  Service: Gastroenterology;  Laterality: N/A;   CORONARY CTO INTERVENTION N/A 03/26/2024   Procedure: CORONARY CTO INTERVENTION;  Surgeon: Jordan, Peter M, MD;  Location: [facility] INVASIVE CV LAB;  Service: Cardiovascular;  Laterality: N/A;   CORONARY  ULTRASOUND/IVUS N/A 03/26/2024   Procedure: Coronary Ultrasound/IVUS;  Surgeon: Jordan, Peter M, MD;  Location: [facility] INVASIVE CV LAB;  Service: Cardiovascular;  Laterality: N/A;   LEFT HEART CATH AND CORONARY ANGIOGRAPHY N/A 03/02/2024   Procedure: LEFT HEART CATH AND CORONARY ANGIOGRAPHY;  Surgeon: Elmira Newman PARAS, MD;  Location: [facility] INVASIVE CV LAB;  Service: Cardiovascular;  Laterality: N/A;   LIPOMA EXCISION N/A 01/28/2020   Procedure: EXCISION RIGHT SCALP LIPOMA;  Surgeon: Elisabeth Craig RAMAN, MD;  Location: [facility];  Service: Plastics;  Laterality: N/A;   POLYPECTOMY  12/20/2021   Procedure: POLYPECTOMY;  Surgeon: Aneita Gwendlyn DASEN, MD;  Location: [facility];  Service: Gastroenterology;;   right arm ORIF      Current Medications: Current Meds  Medication Sig   amLODipine  (NORVASC ) 10 MG tablet Take 1 tablet (10 mg total) by mouth daily.   aspirin  EC 81 MG tablet Take 1 tablet (81 mg total) by mouth daily. Swallow whole.   atorvastatin  (LIPITOR) 40 MG tablet Take 1 tablet (40 mg total) by mouth daily.   brimonidine  (ALPHAGAN ) 0.2 % ophthalmic solution 1 drop 2 (two) times daily.   carvedilol  (COREG ) 25 MG tablet Take 1 tablet (25 mg total) by mouth 2 (two) times daily with a meal.   chlorthalidone  (HYGROTON ) 25 MG tablet TAKE 1 TABLET BY MOUTH DAILY.   clobetasol  ointment (TEMOVATE ) 0.05 % Apply 1 Application topically 3 (three) times  daily as needed (rash.).   clopidogrel  (PLAVIX ) 75 MG tablet Take 1 tablet (75 mg total) by mouth daily.   dapagliflozin  propanediol (FARXIGA ) 10 MG TABS tablet Take 1 tablet (10 mg total) by mouth daily.   Dulaglutide  (TRULICITY ) 1.5 MG/0.5ML SOAJ inject 1.5mg  into THE SKIN ONCE WEEKLY   fluticasone  (FLONASE ) 50 MCG/ACT nasal spray Place 2 sprays into both nostrils daily.   hydrALAZINE  (APRESOLINE ) 100 MG tablet TAKE 1 TABLET BY MOUTH 3 TIMES DAILY   insulin  glargine (LANTUS ) 100 UNIT/ML injection Inject 0.4 mLs (40 Units total) into the  skin 2 (two) times daily.   latanoprost  (XALATAN ) 0.005 % ophthalmic solution Place 1 drop into both eyes at bedtime.   spironolactone  (ALDACTONE ) 50 MG tablet Take 1 tablet (50 mg total) by mouth daily.   STIMULANT LAXATIVE 8.6-50 MG tablet TAKE 1 TABLET BY MOUTH DAILY (Patient taking differently: Take 1 tablet by mouth daily as needed for mild constipation.)     Allergies:   Losartan potassium-hctz and Cozaar [losartan potassium]   Social History   Socioeconomic History   Marital status: Single    Spouse name: n/a   Number of children: 0   Years of education: 18   Highest education level: Not on file  Occupational History   Occupation: EC Lobbyist: [employer]  Tobacco Use   Smoking status: Former    Current packs/day: 0.00    Types: Cigarettes    Quit date: 09/05/1994    Years since quitting: 29.5   Smokeless tobacco: Never  Vaping Use   Vaping status: Never Used  Substance and Sexual Activity   Alcohol use: No    Alcohol/week: 0.0 standard drinks of alcohol   Drug use: No   Sexual activity: Never  Other Topics Concern   Not on file  Social History Narrative   Master's Degree in Adult Education.  Lives alone.   No siblings.   Social Drivers of Health   Tobacco Use: Medium Risk (03/26/2024)   Patient History    Smoking Tobacco Use: Former    Smokeless Tobacco Use: Never    Passive Exposure: Not on file  Financial Resource Strain: Low Risk (10/23/2023)   Overall Financial Resource Strain (CARDIA)    Difficulty of Paying Living Expenses: Not hard at all  Food Insecurity: No Food Insecurity (03/26/2024)   Epic    Worried About Programme Researcher, Broadcasting/film/video in the Last Year: Never true    Ran Out of Food in the Last Year: Never true  Transportation Needs: No Transportation Needs (03/26/2024)   Epic    Lack of Transportation (Medical): No    Lack of Transportation (Non-Medical): No  Physical Activity: Not on file  Stress: No Stress Concern  Present (10/23/2023)   [employer] of Occupational Health - Occupational Stress Questionnaire    Feeling of Stress: Not at all  Social Connections: Moderately Integrated (03/26/2024)   Social Connection and Isolation Panel    Frequency of Communication with Friends and Family: More than three times a week    Frequency of Social Gatherings with Friends and Family: Three times a week    Attends Religious Services: More than 4 times per year    Active Member of Clubs or Organizations: Yes    Attends Banker Meetings: More than 4 times per year    Marital Status: Never married  Depression (PHQ2-9): Low Risk (10/23/2023)   Depression (PHQ2-9)    PHQ-2 Score: 0  Alcohol Screen: Low Risk (10/23/2023)   Alcohol Screen    Last Alcohol Screening Score (AUDIT): 0  Housing: Low Risk (03/26/2024)   Epic    Unable to Pay for Housing in the Last Year: No    Number of Times Moved in the Last Year: 0    Homeless in the Last Year: No  Utilities: Not At Risk (03/26/2024)   Epic    Threatened with loss of utilities: No  Health Literacy: Adequate Health Literacy (10/23/2023)   B1300 Health Literacy    Frequency of need for help with medical instructions: Never     Family History: The patient's family history includes Diabetes in his maternal grandmother and another family member; Hypertension in his mother; Prostate cancer in his maternal grandfather; Stroke in his mother.  ROS:   Please see the history of present illness.     All other systems reviewed and are negative.  EKGs/Labs/Other Studies Reviewed:    The following studies were reviewed today: Echo 02/22/24: IMPRESSIONS     1. Left ventricular ejection fraction, by estimation, is 50%. Left  ventricular ejection fraction by 2D MOD biplane is 47.9 %. The left  ventricle has mildly decreased function. The left ventricle demonstrates  regional wall motion abnormalities (see  scoring diagram/findings for description). There  is moderate concentric  left ventricular hypertrophy. Left ventricular diastolic parameters are  consistent with Grade I diastolic dysfunction (impaired relaxation). The  average left ventricular global  longitudinal strain is -12.4 %. The global longitudinal strain is  abnormal.   2. Right ventricular systolic function is normal. The right ventricular  size is normal. Tricuspid regurgitation signal is inadequate for assessing  PA pressure.   3. The mitral valve is normal in structure. Trivial mitral valve  regurgitation. No evidence of mitral stenosis.   4. The aortic valve is tricuspid. There is trivial aortic regurgitation.  There is no aortic valve stenosis. There is a small echodensity on the  non-coronary cusp of the aortic valve favored to be nodular calcification.   5. The inferior vena cava is normal in size with greater than 50%  respiratory variability, suggesting right atrial pressure of 3 mmHg.   Comparison(s): No prior Echocardiogram.   Conclusion(s)/Recommendation(s): Mildly reduced LV systolic function with  an LVEF = 50%. Normal RV systolic function. No significant valvular  disease.   LEFT HEART CATH AND CORONARY ANGIOGRAPHY   Conclusion  Coronary angiography 03/02/2024: LM: Distal 20% stenosis LAD: Mid 80% calcific stenosis, followed by subtotal calcific occlusion of mid LAD          Mid to distal LAD diffuse 80% calcific disease          Large diag 2 with diffuse 60% disease Lcx: Proximal 90% stenosis         Subtotal occlusion of OM1 RCA: Diffuse 80% calcific prox-mid stenosis             Distal 70% disease           RPDA focal 90% stenosis   Collateralization from RCA to LAD/septals, diagonal-LAD and OM1   LVEDP 24 mmHg      Conclusion: Severe multivessel coronary artery disease I am doubtful about CABG targets to LAD and RPDA, diag, OM could be targets Percutaneous intervention unlikely to feasible and beneficial Overall, very difficult anatomy  with severe disease Will discuss in multidisciplinary heart team meeting and refer to see CVTS (unless deemed not to be surgical candidate during Friday meeting)   Unusually  delayed procedure time due to following reasons: Severe tortuosity in right subclavian artery, right innominate artery, and aorta necessitating use of destination sheath.  In spite of using destination sheath, it was extremely challenging to engage his coronary arteries requiring use of multiple catheters, wires and significant difficulty requiring long procedure time, and fluoroscopy time.   Newman JINNY Lawrence, MD     Cardiac cath 11/18 /25:       Recent Labs: 10/23/2023: ALT 9 03/27/2024: BUN 19; Creatinine, Ser 1.03; Hemoglobin 14.0; Platelets 193; Potassium 3.7; Sodium 139  Recent Lipid Panel    Component Value Date/Time   CHOL 187 01/09/2024 1637   TRIG 121 01/09/2024 1637   HDL 34 (L) 01/09/2024 1637   CHOLHDL 5.5 (H) 01/09/2024 1637   CHOLHDL 3.8 01/16/2016 1619   VLDL 23 01/16/2016 1619   LDLCALC 131 (H) 01/09/2024 1637     Risk Assessment/Calculations:                Physical Exam:    VS:  BP 137/78 (BP Location: Right Arm)   Pulse 75   Temp 98.2 F (36.8 C) (Tympanic)   Resp 18   Ht 5' 11 (1.803 m)   Wt (!) 140.6 kg   SpO2 100%   BMI 43.24 kg/m     Wt Readings from Last 3 Encounters:  03/26/24 (!) 140.6 kg  03/16/24 (!) 141.1 kg  03/02/24 (!) 140.6 kg     GEN:  Well nourished, morbidly obese, in no acute distress HEENT: Normal NECK: No JVD; No carotid bruits LYMPHATICS: No lymphadenopathy CARDIAC: RRR, no murmurs, rubs, gallops RESPIRATORY:  Clear to auscultation without rales, wheezing or rhonchi  ABDOMEN: Soft, non-tender, non-distended MUSCULOSKELETAL:  No edema; No deformity. Good radial pulse.  SKIN: Warm and dry NEUROLOGIC:  Alert and oriented x 3 PSYCHIATRIC:  Normal affect   ASSESSMENT:    1. Angina pectoris    PLAN:    In order of problems listed  above:  CAD: s/p PCI yesterday (see cath report). No residual chest pain. Telemetry reviewed. Continue ASA 81, Plavix , atorvastatin  40.       Informed Consent   Shared Decision Making/Informed Consent The risks [stroke (1 in 1000), death (1 in 1000), kidney failure [usually temporary] (1 in 500), bleeding (1 in 200), allergic reaction [possibly serious] (1 in 200)], benefits (diagnostic support and management of coronary artery disease) and alternatives of a cardiac catheterization were discussed in detail with Mr. Topping and he is willing to proceed.       Medication Adjustments/Labs and Tests Ordered: Current medicines are reviewed at length with the patient today.  Concerns regarding medicines are outlined above.  Orders Placed This Encounter  Procedures   Glucose, capillary   Glucose, capillary   Glucose, capillary   Glucose, capillary   Basic metabolic panel   CBC   Lipoprotein A (LPA)   Hemoglobin A1c   CBC   Creatinine, serum   Creatinine, serum   Glucose, capillary   Glucose, capillary   Glucose, capillary   Glucose, capillary   Diet Carb Modified Room service appropriate? Yes   Apply Cardiac or Vascular Catheterization and/or Intervention Care Plan   Provider attestation of informed consent for procedure/surgical case   Vital signs   Cardiac monitoring   Apply Cardiac or Vascular Catheterization and/or Intervention Care Plan   Refer to Sidebar Report Refer to ICU, Med-Surg, Progressive, and Step-Down Mobility Protocol Sidebars   RN may order Cardiology PRN Orders utilizing Cardiology  PRN medications (through manage orders) for the following patient needs:   Apply Diabetes Mellitus Care Plan   STAT CBG when hypoglycemia is suspected. If treated, recheck every 15 minutes after each treatment until CBG >/= 70 mg/dl   Refer to Hypoglycemia Protocol Sidebar Report for treatment of CBG < 70 mg/dl   Vital signs Post sheath removal: Assess Pain, BP, HR, vascular site and  affected extremity pulse every 15 min X 4, every 30 min X 2, every hour X 2, then per unit routine or discharge order.   If bleeding or hematoma occurs:   During bedrest: May elevate HOB 30 degrees and tilt to affected side while keeping affected leg straight.   After bedrest: OOB to chair with assistance If site remains stable, progress to ambulate in hall.   After bedrest: Discontinue femoral site dressing and apply bandaid to site.   Initiate Oral Care Protocol   Initiate Carrier Fluid Protocol   Remove sheath   Vital signs Pre sheath removal: Assess Pain, BP, HR, vascular site and affected extremity pulse every 15 min X 4, then every 30 min until sheath removal. Assess BP and treat per orders if SBP >/= 150 systolic   Vital signs During sheath removal: Assess BP and HR every 5 minutes.   Bed rest   Apply dry sterile dressing   No HS correction Insulin    Mobility Protocol: No Restrictions (After initial bedrest and site is stable) RN to initiate protocols based on patient's level of care   SCD's   Informed Consent Details: Physician/Practitioner Attestation; Transcribe to consent form and obtain patient signature   Initiate Pre-op Protocol   6 CHG cloth bath night before surgery   6 CHG cloth bath AM of surgery   Diet per anesthesia ENHANCED RECOVERY AFTER SURGERY (ERAS) Pathway.  Diet per anesthesia ENHANCED RECOVERY AFTER SURGERY (ERAS) Pathway. For INPATIENTS, clear liquids after midnight. Three hours prior to scheduled surgery [0430 AM], have patient f...   Pre-admission testing diagnosis   Anesthesia type  Anesthesia Type: General Anesthesia   Anesthesia per Enhanced Recovery Pathway   Labs per anesthesia   Full code   Inpatient consult to spiritual care   Inpatient Phase I Cardiac Rehab   POCT Activated clotting time   POCT Activated clotting time   POCT Activated clotting time   POCT Activated clotting time   POCT Activated clotting time   POCT Activated clotting time    POCT Activated clotting time   POCT Activated clotting time   EKG 12-Lead immediately post procedure   EKG 12-Lead   Outpatient with extended recovery   Meds ordered this encounter  Medications   DISCONTD: free water  500 mL   DISCONTD: sodium chloride  flush (NS) 0.9 % injection 3 mL   DISCONTD: sodium chloride  flush (NS) 0.9 % injection 3 mL   DISCONTD: 0.9 %  sodium chloride  infusion   dextrose  50 % solution 25 mL   dextrose  50 % solution    Ali, Vivian M: cabinet override   DISCONTD: lidocaine  (PF) (XYLOCAINE ) 1 % injection   nitroGLYCERIN  100 mcg/mL intra-arterial injection    Sipes, Heidi: cabinet override   DISCONTD: heparin  sodium (porcine) injection   DISCONTD: Heparin  (Porcine) in NaCl 1000-0.9 UT/500ML-% SOLN   DISCONTD: fentaNYL  (SUBLIMAZE ) injection   DISCONTD: midazolam  PF (VERSED ) injection   DISCONTD: dextrose  50 % solution   DISCONTD: nitroGLYCERIN  1 mg/10 mL (100 mcg/mL) - IR/CATH LAB   DISCONTD: iohexol  (OMNIPAQUE ) 350 MG/ML injection  amLODipine  (NORVASC ) tablet 10 mg   aspirin  EC tablet 81 mg    Swallow whole.     atorvastatin  (LIPITOR) tablet 40 mg   brimonidine  (ALPHAGAN ) 0.2 % ophthalmic solution 1 drop   carvedilol  (COREG ) tablet 25 mg   chlorthalidone  (HYGROTON ) tablet 25 mg    TAKE 1 TABLET BY MOUTH DAILY.     clopidogrel  (PLAVIX ) tablet 75 mg   dapagliflozin  propanediol (FARXIGA ) tablet 10 mg   hydrALAZINE  (APRESOLINE ) tablet 100 mg   insulin  glargine (LANTUS ) injection 30 Units   latanoprost  (XALATAN ) 0.005 % ophthalmic solution 1 drop   spironolactone  (ALDACTONE ) tablet 50 mg   pantoprazole  (PROTONIX ) EC tablet 40 mg   hydrALAZINE  (APRESOLINE ) injection 10 mg   acetaminophen  (TYLENOL ) tablet 650 mg   ondansetron  (ZOFRAN ) injection 4 mg   enoxaparin  (LOVENOX ) injection 60 mg   free water  500 mL   sodium chloride  flush (NS) 0.9 % injection 3 mL   sodium chloride  flush (NS) 0.9 % injection 3 mL   0.9 %  sodium chloride  infusion   insulin   aspart (novoLOG ) injection 0-15 Units    Correction coverage::   Moderate (average weight, post-op)    CBG < 70::   Implement Hypoglycemia Standing Orders and refer to Hypoglycemia Standing Orders sidebar report    CBG 70 - 120::   0 units    CBG 121 - 150::   2 units    CBG 151 - 200::   3 units    CBG 201 - 250::   5 units    CBG 251 - 300::   8 units    CBG 301 - 350::   11 units    CBG 351 - 400::   15 units    CBG > 400:   call MD and obtain STAT lab verification   AND Linked Order Group    Chlorhexidine  Gluconate Cloth 2 % PADS 6 each    Chlorhexidine  Gluconate Cloth 2 % PADS 6 each   feeding supplement (ENSURE PRE-SURGERY) liquid 296 mL   acetaminophen  (TYLENOL ) tablet 1,000 mg    There are no outpatient Patient Instructions on file for this admission.   Signed, Eulas FORBES Furbish, MD  03/27/2024 7:57 AM    [facility]

## 2024-03-27 NOTE — Progress Notes (Signed)
°   03/27/24 1030  Spiritual Encounters  Type of Visit Initial  Care provided to: Patient  Reason for visit Advance directives  OnCall Visit No   This chaplain visited the patient and educated him on the Advance Directive document. He understands the document and will fill it out. He is being discharged today.

## 2024-03-27 NOTE — Progress Notes (Signed)
 CARDIAC REHAB PHASE I   PRE:  Rate/Rhythm: Sinus 70  BP:  Supine: 140/80    SaO2: 98% RA  MODE:  Ambulation: 600 ft   POST:  Rate/Rhythem: 77  BP:   Sitting: 154\82     SaO2: 99% RA 9084-8979   Patient ambulated in the hallway independently without difficulty or symptoms. Assisted back to chair with call bell within reach. Patient has stent card. Reviewed  Exercise instructions Temperature precautions, End points of exercise. Patient is interested in outpatient cardiac rehab in [place]. Reviewed heart healthy diabetic diet information with the patient. Mr Hadley may benefit from a continuous glucose meter he will reach out to his PCP Dr Vicci regarding this. Discussed importance of taking plavix , sublingual nitroglycerin  and when to call 911.  Hadassah Elpidio Quan RN

## 2024-03-29 LAB — LIPOPROTEIN A (LPA): Lipoprotein (a): 131.8 nmol/L — ABNORMAL HIGH (ref <75.0)

## 2024-03-30 ENCOUNTER — Ambulatory Visit: Admitting: Emergency Medicine

## 2024-03-31 ENCOUNTER — Telehealth (HOSPITAL_COMMUNITY): Payer: Self-pay

## 2024-03-31 ENCOUNTER — Ambulatory Visit (HOSPITAL_COMMUNITY): Admission: RE | Admit: 2024-03-31 | Source: Home / Self Care | Admitting: Surgery

## 2024-03-31 ENCOUNTER — Encounter (HOSPITAL_COMMUNITY): Payer: Self-pay | Admitting: Vascular Surgery

## 2024-03-31 SURGERY — REPAIR, HERNIA, UMBILICAL, ADULT
Anesthesia: General

## 2024-03-31 NOTE — Telephone Encounter (Signed)
 Attempted to call patient in regards to Cardiac Rehab - LM on VM

## 2024-04-01 ENCOUNTER — Telehealth (HOSPITAL_COMMUNITY): Payer: Self-pay

## 2024-04-01 NOTE — Telephone Encounter (Signed)
 Pt returned phone and stated that he is interested in the cardiac rehab. Will contact pt after he has been reviewed.

## 2024-04-14 ENCOUNTER — Other Ambulatory Visit: Payer: Self-pay | Admitting: Internal Medicine

## 2024-04-14 ENCOUNTER — Ambulatory Visit: Payer: Self-pay

## 2024-04-14 MED ORDER — PREDNISONE 20 MG PO TABS: ORAL_TABLET | ORAL | 0 refills | Status: DC

## 2024-04-14 NOTE — Telephone Encounter (Signed)
 FYI Only or Action Required?: FYI only for provider: Pt offered an earlier appt with HCP - pt declined and states he wants to wait to see PCP on 05/03/24. Writer provided the pt with home care recommendations. .  Patient was last seen in primary care on 01/02/2024 by Vicci Barnie NOVAK, MD.  Called Nurse Triage reporting Hand Pain.  Symptoms began several weeks ago.  Interventions attempted: Nothing.  Symptoms are: gradually worsening.  Triage Disposition: See PCP Within 2 Weeks  Patient/caregiver understands and will follow disposition?:   Reason for Disposition  [1] MILD pain (e.g., does not interfere with normal activities) AND [2] present > 7 days  Answer Assessment - Initial Assessment Questions Pt is out of town and went to [facility] for pain in right hand 03/30/24 -  put told he has gout and told to follow-up with PCP - pt scheduled to see PCP 05/03/24. Pt completed medication that was prescribed from UC last week. Pt is now reporting 10/10 hand/finger pain, pointer finger has swelling in the joint, and soreness. Not taking OTC medications.  Pt offered an earlier appt with HCP - pt declined and states he wants to wait to see PCP on 05/03/24. Writer provided the pt with home care recommendations.   1. ONSET: When did the pain start?     03/30/24 2. LOCATION: Where is the pain located?     Right pointer finger 3. PAIN: How bad is the pain? (Scale 1-10; or mild, moderate, severe)     10/10 4. WORK OR EXERCISE: Has there been any recent work or exercise that involved this part (i.e., hand or wrist) of the body?     N/a 5. CAUSE: What do you think is causing the pain?     Gout  6. AGGRAVATING FACTORS: What makes the pain worse? (e.g., using computer)     N/a 7. OTHER SYMPTOMS: Do you have any other symptoms? (e.g., fever, neck pain, numbness or tingling, rash, swelling)     Denies  Protocols used: Hand Pain-A-AH  Copied from CRM #[id number]. Topic: Clinical - Red Word  Triage >> Apr 14, 2024 11:23 AM Antwanette L wrote: Red Word that prompted transfer to Nurse Triage: The pt is out of town and currently in [place], [place]. He was seen at an [facility] on 12/18 and diagnosed with gout in his right hand. He reports ongoing pain. He was prescribed colchicine  0.6 mg tablet and prednisone  20mg  tablet but there are no refills. The pt is requesting an refill

## 2024-04-14 NOTE — Telephone Encounter (Signed)
 Called & spoke to the patient. Verified name & DOB. Inquired that due to him being out of town would he still like for a rx to be sent to his regular pharmacy. Patient would like for the rx to be sent to [facility] in [place], [place] on [address].

## 2024-04-14 NOTE — Telephone Encounter (Signed)
 Since he is out of town inquire where he wants me to send rxn for his gout.

## 2024-04-14 NOTE — Telephone Encounter (Signed)
 FYI

## 2024-04-16 ENCOUNTER — Other Ambulatory Visit: Payer: Self-pay | Admitting: Internal Medicine

## 2024-04-16 MED ORDER — PREDNISONE 20 MG PO TABS: ORAL_TABLET | ORAL | 0 refills | Status: DC

## 2024-04-19 ENCOUNTER — Encounter: Payer: Self-pay | Admitting: Podiatry

## 2024-04-19 ENCOUNTER — Ambulatory Visit (INDEPENDENT_AMBULATORY_CARE_PROVIDER_SITE_OTHER): Admitting: Podiatry

## 2024-04-19 DIAGNOSIS — E1151 Type 2 diabetes mellitus with diabetic peripheral angiopathy without gangrene: Secondary | ICD-10-CM | POA: Diagnosis not present

## 2024-04-19 DIAGNOSIS — B351 Tinea unguium: Secondary | ICD-10-CM | POA: Diagnosis not present

## 2024-04-19 DIAGNOSIS — M79675 Pain in left toe(s): Secondary | ICD-10-CM

## 2024-04-19 DIAGNOSIS — M79674 Pain in right toe(s): Secondary | ICD-10-CM

## 2024-04-19 NOTE — Progress Notes (Signed)
This patient returns to my office for at risk foot care.  This patient requires this care by a professional since this patient will be at risk due to having diabetes.  This patient is unable to cut nails himself since the patient cannot reach his nails.These nails are painful walking and wearing shoes.  He also has callus on the outside of his left foot.This patient presents for at risk foot care today.  General Appearance  Alert, conversant and in no acute stress.  Vascular  Dorsalis pedis and posterior tibial  pulses are palpable  bilaterally.  Capillary return is within normal limits  bilaterally. Temperature is within normal limits  bilaterally.  Neurologic  Senn-Weinstein monofilament wire test within normal limits  bilaterally. Muscle power within normal limits bilaterally.  Nails Thick disfigured discolored nails with subungual debris  from hallux to fifth toes bilaterally. No evidence of bacterial infection or drainage bilaterally.  Orthopedic  No limitations of motion  feet .  No crepitus or effusions noted.  No bony pathology or digital deformities noted.  Skin  normotropic skin with no porokeratosis noted bilaterally.  No signs of infections or ulcers noted.   Callus lateral aspect fifth metabase left foot.  Onychomycosis  Pain in right toes  Pain in left toes  Callus left foot.  Consent was obtained for treatment procedures.   Mechanical debridement of nails 1-5  bilaterally performed with a nail nipper.  Filed with dremel without incident. Debride callus with # 15 blade and dremel tool.   Return office visit     3  months                 Told patient to return for periodic foot care and evaluation due to potential at risk complications.   Sahej Hauswirth DPM   

## 2024-04-21 ENCOUNTER — Other Ambulatory Visit (HOSPITAL_COMMUNITY): Payer: Self-pay

## 2024-04-22 ENCOUNTER — Other Ambulatory Visit (HOSPITAL_COMMUNITY): Payer: Self-pay

## 2024-04-22 ENCOUNTER — Other Ambulatory Visit: Payer: Self-pay

## 2024-04-23 NOTE — Progress Notes (Unsigned)
 " Cardiology Office Note:    Date:  04/23/2024   ID:  Justin Houston, DOB June 02, 1965, MRN [id number]  PCP:  Vicci Barnie NOVAK, MD   [facility] Providers Cardiologist:  None     Referring MD: Vicci Barnie NOVAK, MD   No chief complaint on file.   History of Present Illness:    Justin Houston is a 59 y.o. male seen at the request of Dr Elmira for consideration of complex PCI. He has a history of DM, HLD, HTN, obesity.  He was seen in October with one month history of exertional chest pain and SOB. Ecg showed evidence of old anteroseptal infarct and T wave inversions. He underwent cardiac cath on 03/02/24 showing severe multivessel CAD with occlusion of the LAD and first OM with collaterals. Also severe proximal LCx stenosis and severe diffuse stenosis in the RCA and PDA. Echo showed EF 50% with inferior HK. He was presented to our heart team and felt to be a poor candidate for CABG due to poor targets particularly the LAD. He has persistent symptoms despite optimal medical therapy.   He did undergo CTO PCI with successful stenting of the proximal LAD and proximal LCx into the first OM. The mid to distal LAD was diffusely diseased and will continue medical therapy for residual disease there and in RCA.   Past Medical History:  Diagnosis Date   Dermatitis    Diabetes mellitus    GERD (gastroesophageal reflux disease)    Gout    Hyperlipidemia    Hypertension    Lipoma of scalp    Mass of head, right postauricular SQ 7x4cm 09/29/2013   Obesity    Toenail fungus 08/31/2018    Past Surgical History:  Procedure Laterality Date   COLONOSCOPY WITH PROPOFOL  N/A 12/01/2014   Procedure: COLONOSCOPY WITH PROPOFOL ;  Surgeon: Toribio SHAUNNA Cedar, MD;  Location: [facility];  Service: Endoscopy;  Laterality: N/A;   COLONOSCOPY WITH PROPOFOL  N/A 12/20/2021   Procedure: COLONOSCOPY WITH PROPOFOL ;  Surgeon: Aneita Gwendlyn DASEN, MD;  Location: [facility];  Service: Gastroenterology;   Laterality: N/A;   CORONARY CTO INTERVENTION N/A 03/26/2024   Procedure: CORONARY CTO INTERVENTION;  Surgeon: Tavia Stave M, MD;  Location: [facility] INVASIVE CV LAB;  Service: Cardiovascular;  Laterality: N/A;   CORONARY ULTRASOUND/IVUS N/A 03/26/2024   Procedure: Coronary Ultrasound/IVUS;  Surgeon: Tomer Chalmers M, MD;  Location: [facility] INVASIVE CV LAB;  Service: Cardiovascular;  Laterality: N/A;   LEFT HEART CATH AND CORONARY ANGIOGRAPHY N/A 03/02/2024   Procedure: LEFT HEART CATH AND CORONARY ANGIOGRAPHY;  Surgeon: Elmira Newman PARAS, MD;  Location: [facility] INVASIVE CV LAB;  Service: Cardiovascular;  Laterality: N/A;   LIPOMA EXCISION N/A 01/28/2020   Procedure: EXCISION RIGHT SCALP LIPOMA;  Surgeon: Elisabeth Craig RAMAN, MD;  Location: [facility];  Service: Plastics;  Laterality: N/A;   POLYPECTOMY  12/20/2021   Procedure: POLYPECTOMY;  Surgeon: Aneita Gwendlyn DASEN, MD;  Location: [facility];  Service: Gastroenterology;;   right arm ORIF      Current Medications: No outpatient medications have been marked as taking for the 04/26/24 encounter (Appointment) with Tenessa Marsee M, MD.     Allergies:   Losartan potassium-hctz and Cozaar [losartan potassium]   Social History   Socioeconomic History   Marital status: Single    Spouse name: n/a   Number of children: 0   Years of education: 18   Highest education level: Not on file  Occupational History   Occupation:  EC Lobbyist: [employer]  Tobacco Use   Smoking status: Former    Current packs/day: 0.00    Types: Cigarettes    Quit date: 09/05/1994    Years since quitting: 29.6   Smokeless tobacco: Never  Vaping Use   Vaping status: Never Used  Substance and Sexual Activity   Alcohol use: No    Alcohol/week: 0.0 standard drinks of alcohol   Drug use: No   Sexual activity: Never  Other Topics Concern   Not on file  Social History Narrative   Master's Degree in Adult Education.  Lives alone.   No  siblings.   Social Drivers of Health   Tobacco Use: Medium Risk (04/19/2024)   Patient History    Smoking Tobacco Use: Former    Smokeless Tobacco Use: Never    Passive Exposure: Not on file  Financial Resource Strain: Low Risk (10/23/2023)   Overall Financial Resource Strain (CARDIA)    Difficulty of Paying Living Expenses: Not hard at all  Food Insecurity: No Food Insecurity (03/26/2024)   Epic    Worried About Programme Researcher, Broadcasting/film/video in the Last Year: Never true    Ran Out of Food in the Last Year: Never true  Transportation Needs: No Transportation Needs (03/26/2024)   Epic    Lack of Transportation (Medical): No    Lack of Transportation (Non-Medical): No  Physical Activity: Not on file  Stress: No Stress Concern Present (10/23/2023)   [employer] of Occupational Health - Occupational Stress Questionnaire    Feeling of Stress: Not at all  Social Connections: Moderately Integrated (03/26/2024)   Social Connection and Isolation Panel    Frequency of Communication with Friends and Family: More than three times a week    Frequency of Social Gatherings with Friends and Family: Three times a week    Attends Religious Services: More than 4 times per year    Active Member of Clubs or Organizations: Yes    Attends Banker Meetings: More than 4 times per year    Marital Status: Never married  Depression (PHQ2-9): Low Risk (10/23/2023)   Depression (PHQ2-9)    PHQ-2 Score: 0  Alcohol Screen: Low Risk (10/23/2023)   Alcohol Screen    Last Alcohol Screening Score (AUDIT): 0  Housing: Low Risk (03/26/2024)   Epic    Unable to Pay for Housing in the Last Year: No    Number of Times Moved in the Last Year: 0    Homeless in the Last Year: No  Utilities: Not At Risk (03/26/2024)   Epic    Threatened with loss of utilities: No  Health Literacy: Adequate Health Literacy (10/23/2023)   B1300 Health Literacy    Frequency of need for help with medical instructions: Never      Family History: The patient's family history includes Diabetes in his maternal grandmother and another family member; Hypertension in his mother; Prostate cancer in his maternal grandfather; Stroke in his mother.  ROS:   Please see the history of present illness.     All other systems reviewed and are negative.  EKGs/Labs/Other Studies Reviewed:    The following studies were reviewed today: Echo 02/22/24: IMPRESSIONS     1. Left ventricular ejection fraction, by estimation, is 50%. Left  ventricular ejection fraction by 2D MOD biplane is 47.9 %. The left  ventricle has mildly decreased function. The left ventricle demonstrates  regional wall motion abnormalities (see  scoring diagram/findings  for description). There is moderate concentric  left ventricular hypertrophy. Left ventricular diastolic parameters are  consistent with Grade I diastolic dysfunction (impaired relaxation). The  average left ventricular global  longitudinal strain is -12.4 %. The global longitudinal strain is  abnormal.   2. Right ventricular systolic function is normal. The right ventricular  size is normal. Tricuspid regurgitation signal is inadequate for assessing  PA pressure.   3. The mitral valve is normal in structure. Trivial mitral valve  regurgitation. No evidence of mitral stenosis.   4. The aortic valve is tricuspid. There is trivial aortic regurgitation.  There is no aortic valve stenosis. There is a small echodensity on the  non-coronary cusp of the aortic valve favored to be nodular calcification.   5. The inferior vena cava is normal in size with greater than 50%  respiratory variability, suggesting right atrial pressure of 3 mmHg.   Comparison(s): No prior Echocardiogram.   Conclusion(s)/Recommendation(s): Mildly reduced LV systolic function with  an LVEF = 50%. Normal RV systolic function. No significant valvular  disease.   LEFT HEART CATH AND CORONARY ANGIOGRAPHY    Conclusion  Coronary angiography 03/02/2024: LM: Distal 20% stenosis LAD: Mid 80% calcific stenosis, followed by subtotal calcific occlusion of mid LAD          Mid to distal LAD diffuse 80% calcific disease          Large diag 2 with diffuse 60% disease Lcx: Proximal 90% stenosis         Subtotal occlusion of OM1 RCA: Diffuse 80% calcific prox-mid stenosis             Distal 70% disease           RPDA focal 90% stenosis   Collateralization from RCA to LAD/septals, diagonal-LAD and OM1   LVEDP 24 mmHg      Conclusion: Severe multivessel coronary artery disease I am doubtful about CABG targets to LAD and RPDA, diag, OM could be targets Percutaneous intervention unlikely to feasible and beneficial Overall, very difficult anatomy with severe disease Will discuss in multidisciplinary heart team meeting and refer to see CVTS (unless deemed not to be surgical candidate during Friday meeting)   Unusually delayed procedure time due to following reasons: Severe tortuosity in right subclavian artery, right innominate artery, and aorta necessitating use of destination sheath.  In spite of using destination sheath, it was extremely challenging to engage his coronary arteries requiring use of multiple catheters, wires and significant difficulty requiring long procedure time, and fluoroscopy time.   Newman JINNY Lawrence, MD     Cardiac cath 11/18 /25:      PCI 03/26/24:  CORONARY CTO INTERVENTION  Coronary Ultrasound/IVUS   Conclusion      Prox LAD to Mid LAD lesion is 80% stenosed.   Mid LAD lesion is 100% stenosed.   A drug-eluting stent was successfully placed using a STENT SYNERGY XD 3.0X48.   Post intervention, there is a 0% residual stenosis.   Prox Cx lesion is 90% stenosed.   A drug-eluting stent was successfully placed using a STENT SYNERGY XD 2.50X32.   Post intervention, there is a 0% residual stenosis.   1st Mrg-1 lesion is 100% stenosed.   1st Mrg-2 lesion is 60%  stenosed.   A drug-eluting stent was successfully placed using a STENT SYNERGY XD 2.50X32.   Post intervention, there is a 0% residual stenosis.   Post intervention, there is a 0% residual stenosis.   Post intervention, there is  a 0% residual stenosis.   In the absence of any other complications or medical issues, we expect the patient to be ready for discharge from an interventional cardiology perspective on 03/27/2024.   Recommend dual antiplatelet therapy with Aspirin  81mg  daily and Clopidogrel  75mg  daily long-term (beyond 12 months) because of multiple long stents.   Successful CTO PCI of the proximal to mid LAD with 3.0 x 48 mm Synergy stent. IVUS guidance. Successful CTO PCI of the first OM with 2.5 x 32 mm Synergy stent Successful PCI of the proximal LCx with 2.5 x 32 mm Synergy stent   Plan: DAPT indefinitely given multiple long stents. Will observe overnight and plan DC in am. Continue antianginal therapy for residual diffuse disease in the RCA and mid to distal LAD  Intervention     Recent Labs: 10/23/2023: ALT 9 03/27/2024: BUN 19; Creatinine, Ser 1.03; Hemoglobin 14.0; Platelets 193; Potassium 3.7; Sodium 139  Recent Lipid Panel    Component Value Date/Time   CHOL 187 01/09/2024 1637   TRIG 121 01/09/2024 1637   HDL 34 (L) 01/09/2024 1637   CHOLHDL 5.5 (H) 01/09/2024 1637   CHOLHDL 3.8 01/16/2016 1619   VLDL 23 01/16/2016 1619   LDLCALC 131 (H) 01/09/2024 1637     Risk Assessment/Calculations:      No BP recorded.  {Refresh Note OR Click here to enter BP  :1}***         Physical Exam:    VS:  There were no vitals taken for this visit.    Wt Readings from Last 3 Encounters:  03/26/24 (!) 310 lb (140.6 kg)  03/16/24 (!) 311 lb (141.1 kg)  03/02/24 (!) 310 lb (140.6 kg)     GEN:  Well nourished, morbidly obese, in no acute distress HEENT: Normal NECK: No JVD; No carotid bruits LYMPHATICS: No lymphadenopathy CARDIAC: RRR, no murmurs, rubs,  gallops RESPIRATORY:  Clear to auscultation without rales, wheezing or rhonchi  ABDOMEN: Soft, non-tender, non-distended MUSCULOSKELETAL:  No edema; No deformity. Good radial pulse.  SKIN: Warm and dry NEUROLOGIC:  Alert and oriented x 3 PSYCHIATRIC:  Normal affect   ASSESSMENT:    No diagnosis found.  PLAN:    In order of problems listed above:  CAD with class 3 angina despite optimal medical therapy. Patient has severe complex CAD including CTO of the LAD and OM1. He is not felt to be a suitable candidate for CABG due to poor targets. I think it is reasonable to try and address what we can with PCI. Now s/p successful CTO PCI of the proximal LAD and proximal LCx into OM1.    {The patient has an active order for outpatient cardiac rehabilitation.   Please indicate if the patient is ready to start. Do NOT delete this.  It will auto delete.  Refresh note, then sign.              Click here to document readiness and see contraindications.  :1}  Cardiac Rehabilitation Eligibility Assessment       Informed Consent   Shared Decision Making/Informed Consent The risks [stroke (1 in 1000), death (1 in 1000), kidney failure [usually temporary] (1 in 500), bleeding (1 in 200), allergic reaction [possibly serious] (1 in 200)], benefits (diagnostic support and management of coronary artery disease) and alternatives of a cardiac catheterization were discussed in detail with Mr. Arnott and he is willing to proceed.       Medication Adjustments/Labs and Tests Ordered: Current medicines are reviewed  at length with the patient today.  Concerns regarding medicines are outlined above.  No orders of the defined types were placed in this encounter.  No orders of the defined types were placed in this encounter.   There are no Patient Instructions on file for this visit.   Signed, Kyan Yurkovich, MD  04/23/2024 8:04 AM    [facility]  "

## 2024-04-26 ENCOUNTER — Encounter: Payer: Self-pay | Admitting: Cardiology

## 2024-04-26 ENCOUNTER — Ambulatory Visit: Attending: Cardiology | Admitting: Cardiology

## 2024-04-26 VITALS — BP 120/62 | HR 63 | Ht 71.0 in | Wt 313.4 lb

## 2024-04-26 DIAGNOSIS — I25118 Atherosclerotic heart disease of native coronary artery with other forms of angina pectoris: Secondary | ICD-10-CM | POA: Diagnosis not present

## 2024-04-26 DIAGNOSIS — E118 Type 2 diabetes mellitus with unspecified complications: Secondary | ICD-10-CM

## 2024-04-26 DIAGNOSIS — I10 Essential (primary) hypertension: Secondary | ICD-10-CM | POA: Diagnosis not present

## 2024-04-26 DIAGNOSIS — I209 Angina pectoris, unspecified: Secondary | ICD-10-CM

## 2024-04-26 DIAGNOSIS — E785 Hyperlipidemia, unspecified: Secondary | ICD-10-CM

## 2024-04-26 NOTE — Patient Instructions (Addendum)
 Medication Instructions:  Continue same medications *If you need a refill on your cardiac medications before your next appointment, please call your pharmacy*  Lab Work: Have a lipid and hepatic panels today  Testing/Procedures: None ordered  Follow-Up: At [facility], you and your health needs are our priority.  As part of our continuing mission to provide you with exceptional heart care, our providers are all part of one team.  This team includes your primary Cardiologist (physician) and Advanced Practice Providers or APPs (Physician Assistants and Nurse Practitioners) who all work together to provide you with the care you need, when you need it.  Your next appointment:  4 to 5 months    Provider:  Dr.Jordan   We recommend signing up for the patient portal called MyChart.  Sign up information is provided on this After Visit Summary.  MyChart is used to connect with patients for Virtual Visits (Telemedicine).  Patients are able to view lab/test results, encounter notes, upcoming appointments, etc.  Non-urgent messages can be sent to your provider as well.   To learn more about what you can do with MyChart, go to [email or web address].

## 2024-04-27 ENCOUNTER — Encounter: Payer: Self-pay | Admitting: Cardiology

## 2024-04-27 ENCOUNTER — Ambulatory Visit: Payer: Self-pay | Admitting: Cardiology

## 2024-04-27 LAB — LIPID PANEL
Chol/HDL Ratio: 3.4 ratio (ref 0.0–5.0)
Cholesterol, Total: 142 mg/dL (ref 100–199)
HDL: 42 mg/dL
LDL Chol Calc (NIH): 82 mg/dL (ref 0–99)
Triglycerides: 95 mg/dL (ref 0–149)
VLDL Cholesterol Cal: 18 mg/dL (ref 5–40)

## 2024-04-27 LAB — HEPATIC FUNCTION PANEL
ALT: 14 IU/L (ref 0–44)
AST: 19 IU/L (ref 0–40)
Albumin: 4 g/dL (ref 3.8–4.9)
Alkaline Phosphatase: 87 IU/L (ref 47–123)
Bilirubin Total: 1 mg/dL (ref 0.0–1.2)
Bilirubin, Direct: 0.31 mg/dL (ref 0.00–0.40)
Total Protein: 6.4 g/dL (ref 6.0–8.5)

## 2024-04-28 ENCOUNTER — Other Ambulatory Visit: Payer: Self-pay

## 2024-04-28 DIAGNOSIS — E1169 Type 2 diabetes mellitus with other specified complication: Secondary | ICD-10-CM

## 2024-05-03 ENCOUNTER — Ambulatory Visit: Admitting: Internal Medicine

## 2024-05-07 ENCOUNTER — Ambulatory Visit: Payer: Self-pay | Attending: Internal Medicine | Admitting: Internal Medicine

## 2024-05-07 DIAGNOSIS — Z7985 Long-term (current) use of injectable non-insulin antidiabetic drugs: Secondary | ICD-10-CM | POA: Diagnosis not present

## 2024-05-07 DIAGNOSIS — E1169 Type 2 diabetes mellitus with other specified complication: Secondary | ICD-10-CM

## 2024-05-07 DIAGNOSIS — E1159 Type 2 diabetes mellitus with other circulatory complications: Secondary | ICD-10-CM

## 2024-05-07 DIAGNOSIS — Z794 Long term (current) use of insulin: Secondary | ICD-10-CM

## 2024-05-07 DIAGNOSIS — I25118 Atherosclerotic heart disease of native coronary artery with other forms of angina pectoris: Secondary | ICD-10-CM

## 2024-05-07 DIAGNOSIS — I2510 Atherosclerotic heart disease of native coronary artery without angina pectoris: Secondary | ICD-10-CM | POA: Diagnosis not present

## 2024-05-07 DIAGNOSIS — M109 Gout, unspecified: Secondary | ICD-10-CM | POA: Diagnosis not present

## 2024-05-07 DIAGNOSIS — I10 Essential (primary) hypertension: Secondary | ICD-10-CM

## 2024-05-07 DIAGNOSIS — Z7984 Long term (current) use of oral hypoglycemic drugs: Secondary | ICD-10-CM

## 2024-05-07 DIAGNOSIS — E119 Type 2 diabetes mellitus without complications: Secondary | ICD-10-CM

## 2024-05-07 DIAGNOSIS — E785 Hyperlipidemia, unspecified: Secondary | ICD-10-CM

## 2024-05-07 DIAGNOSIS — I152 Hypertension secondary to endocrine disorders: Secondary | ICD-10-CM

## 2024-05-07 MED ORDER — PREDNISONE 20 MG PO TABS: ORAL_TABLET | ORAL | 0 refills | Status: DC

## 2024-05-07 MED ORDER — DOXAZOSIN MESYLATE 2 MG PO TABS: 2.0000 mg | ORAL_TABLET | Freq: Every day | ORAL | 3 refills | Status: AC

## 2024-05-07 NOTE — Patient Instructions (Signed)
" °  VISIT SUMMARY: During your visit today, we discussed your ongoing management of diabetes, hypertension, coronary artery disease, and a recent gout flare. You reported swelling and warmth in your left wrist and soreness in your left elbow, which we addressed. We also reviewed your medications and made some adjustments to better manage your conditions.  YOUR PLAN: -ACUTE GOUT FLARE: You are experiencing an acute gout flare in your left wrist and elbow, which is causing swelling and warmth. Gout is a form of arthritis caused by excess uric acid in the blood. We have prescribed prednisone  to help reduce inflammation and ordered a uric acid level test. We are holding your chlorthalidone  medication as it may contribute to gout flares. If your uric acid levels are elevated, we may consider starting allopurinol. Please monitor your blood sugar levels during prednisone  treatment as it can cause an increase in blood sugar.  -CORONARY ARTERY DISEASE STATUS POST STENT PLACEMENT: You have coronary artery disease and recently had three stents placed. Since the procedure, you have not experienced any chest pain. Coronary artery disease is a condition where the blood vessels supplying the heart are narrowed or blocked. Continue taking your medications as prescribed: aspirin  81 mg daily, clopidogrel  75 mg daily, atorvastatin  40 mg daily, and carvedilol  25 mg twice daily.  -TYPE 2 DIABETES MELLITUS: Your diabetes is well-controlled with an A1c of 5.6. Diabetes is a condition where the body cannot properly process blood sugar. Continue taking your medications as prescribed: Trulicity  1.5 mg weekly, Farxiga  10 mg daily, Lantus  insulin  40 units twice daily, and metformin  1000 mg twice daily. Please monitor your blood sugar levels, especially during prednisone  treatment.  -HYPERTENSION: Your blood pressure was elevated today at 152/94. Hypertension is high blood pressure, which can increase the risk of heart disease and  stroke. We are holding your chlorthalidone  medication as it may contribute to gout flares and have added doxazosin to be taken at bedtime. Please monitor your blood pressure at home and follow up with the clinical pharmacist on February 26th.  -HYPERLIPIDEMIA: Your LDL cholesterol is reduced to 82 with atorvastatin , but our goal is less than 55. Hyperlipidemia is having high levels of fats in the blood, which can increase the risk of heart disease. Continue taking atorvastatin  40 mg daily and meet with the clinical pharmacist on February 26th to discuss the potential addition of Repal.  -MORBID OBESITY: You have lost 10 pounds since your last visit, now weighing 303 pounds. Morbid obesity is having a body mass index (BMI) of 40 or higher, which can increase the risk of various health conditions. Continue with your lifestyle modifications and plan to start a cardiac rehab program after your appointment on February 26th.  INSTRUCTIONS: Please monitor your blood sugar levels, especially during prednisone  treatment. Monitor your blood pres sure at home and follow up with the clinical pharmacist on February 26th to discuss your hypertension and hyperlipidemia management. Plan to start a cardiac rehab program after your appointment on February 26th.    Contains text generated by [employer]bridge.   "

## 2024-05-07 NOTE — Progress Notes (Unsigned)
 "   Patient ID: Justin Houston, male    DOB: 1965-11-04  MRN: [id number]  CC: Gout (Gout flare-up, swelling of LT hand / elbow X4 days/Already received flu vax)   Subjective: Justin Houston is a 59 y.o. male who presents for chronic ds management. His chronic medical issues include:  Patient with history of DM with retinopathy, HTN, HL, gout, GERD   Discussed the use of AI scribe software for clinical note transcription with the patient, who gave verbal consent to proceed.  History of Present Illness     Patient Active Problem List   Diagnosis Date Noted   CAD (coronary artery disease) 03/27/2024   Angina pectoris 03/26/2024   Type 2 diabetes mellitus with complication, without long-term current use of insulin  (HCC) 02/25/2024   Precordial chest pain 02/25/2024   Chronic right shoulder pain 10/23/2023   History of colonic polyps    Benign neoplasm of transverse colon    Benign neoplasm of rectum    Left hip pain 10/03/2020   Varicose veins of both legs with edema 08/31/2018   GERD (gastroesophageal reflux disease) 08/16/2016   Diabetic retinopathy (HCC) 12/08/2014   Umbilical hernia without obstruction and without gangrene 09/29/2013   Glaucoma 05/26/2013   Gout 08/01/2012   BMI 45.0-49.9, adult (HCC) 08/01/2012   Hypertension 07/20/2011   Diabetes mellitus type 2 with retinopathy (HCC) 07/20/2011   Hyperlipidemia associated with type 2 diabetes mellitus (HCC) 07/20/2011   Type 2 diabetes mellitus with mild nonproliferative retinopathy without macular edema, with long-term current use of insulin  (HCC) 07/20/2011     Medications Ordered Prior to Encounter[1]  Allergies[2]  Social History   Socioeconomic History   Marital status: Single    Spouse name: n/a   Number of children: 0   Years of education: 18   Highest education level: Master's degree (e.g., MA, MS, MEng, MEd, MSW, MBA)  Occupational History   Occupation: EC Lobbyist: [employer]  Tobacco Use   Smoking status: Former    Current packs/day: 0.00    Types: Cigarettes    Quit date: 09/05/1994    Years since quitting: 29.6   Smokeless tobacco: Never  Vaping Use   Vaping status: Never Used  Substance and Sexual Activity   Alcohol use: No    Alcohol/week: 0.0 standard drinks of alcohol   Drug use: No   Sexual activity: Never  Other Topics Concern   Not on file  Social History Narrative   Master's Degree in Adult Education.  Lives alone.   No siblings.   Social Drivers of Health   Tobacco Use: Medium Risk (04/26/2024)   Patient History    Smoking Tobacco Use: Former    Smokeless Tobacco Use: Never    Passive Exposure: Not on file  Financial Resource Strain: Low Risk (04/29/2024)   Overall Financial Resource Strain (CARDIA)    Difficulty of Paying Living Expenses: Not hard at all  Food Insecurity: No Food Insecurity (04/29/2024)   Epic    Worried About Programme Researcher, Broadcasting/film/video in the Last Year: Never true    Ran Out of Food in the Last Year: Never true  Transportation Needs: No Transportation Needs (04/29/2024)   Epic    Lack of Transportation (Medical): No    Lack of Transportation (Non-Medical): No  Physical Activity: Not on file  Stress: No Stress Concern Present (10/23/2023)   [employer] of Occupational Health - Occupational Stress Questionnaire    Feeling  of Stress: Not at all  Social Connections: Moderately Isolated (04/29/2024)   Social Connection and Isolation Panel    Frequency of Communication with Friends and Family: More than three times a week    Frequency of Social Gatherings with Friends and Family: More than three times a week    Attends Religious Services: More than 4 times per year    Active Member of Clubs or Organizations: No    Attends Banker Meetings: Not on file    Marital Status: Never married  Intimate Partner Violence: Not At Risk (03/26/2024)   Epic    Fear of Current or Ex-Partner: No     Emotionally Abused: No    Physically Abused: No    Sexually Abused: No  Depression (PHQ2-9): Low Risk (10/23/2023)   Depression (PHQ2-9)    PHQ-2 Score: 0  Alcohol Screen: Low Risk (10/23/2023)   Alcohol Screen    Last Alcohol Screening Score (AUDIT): 0  Housing: Low Risk (04/29/2024)   Epic    Unable to Pay for Housing in the Last Year: No    Number of Times Moved in the Last Year: 0    Homeless in the Last Year: No  Utilities: Not At Risk (03/26/2024)   Epic    Threatened with loss of utilities: No  Health Literacy: Adequate Health Literacy (10/23/2023)   B1300 Health Literacy    Frequency of need for help with medical instructions: Never    Family History  Problem Relation Age of Onset   Stroke Mother    Hypertension Mother    Diabetes Maternal Grandmother    Prostate cancer Maternal Grandfather    Diabetes Other     Past Surgical History:  Procedure Laterality Date   COLONOSCOPY WITH PROPOFOL  N/A 12/01/2014   Procedure: COLONOSCOPY WITH PROPOFOL ;  Surgeon: Toribio SHAUNNA Cedar, MD;  Location: [facility];  Service: Endoscopy;  Laterality: N/A;   COLONOSCOPY WITH PROPOFOL  N/A 12/20/2021   Procedure: COLONOSCOPY WITH PROPOFOL ;  Surgeon: Aneita Gwendlyn DASEN, MD;  Location: [facility];  Service: Gastroenterology;  Laterality: N/A;   CORONARY CTO INTERVENTION N/A 03/26/2024   Procedure: CORONARY CTO INTERVENTION;  Surgeon: Jordan, Peter M, MD;  Location: [facility];  Service: Cardiovascular;  Laterality: N/A;   CORONARY ULTRASOUND/IVUS N/A 03/26/2024   Procedure: Coronary Ultrasound/IVUS;  Surgeon: Jordan, Peter M, MD;  Location: [facility];  Service: Cardiovascular;  Laterality: N/A;   LEFT HEART CATH AND CORONARY ANGIOGRAPHY N/A 03/02/2024   Procedure: LEFT HEART CATH AND CORONARY ANGIOGRAPHY;  Surgeon: Elmira Newman PARAS, MD;  Location: [facility];  Service: Cardiovascular;  Laterality: N/A;   LIPOMA EXCISION N/A 01/28/2020   Procedure: EXCISION RIGHT SCALP  LIPOMA;  Surgeon: Elisabeth Craig RAMAN, MD;  Location: [facility];  Service: Plastics;  Laterality: N/A;   POLYPECTOMY  12/20/2021   Procedure: POLYPECTOMY;  Surgeon: Aneita Gwendlyn DASEN, MD;  Location: [facility];  Service: Gastroenterology;;   right arm ORIF      ROS: Review of Systems Negative except as stated above  PHYSICAL EXAM: BP (!) 152/94 (BP Location: Right Arm, Patient Position: Sitting, Cuff Size: Large)   Pulse 76   Ht 5' 11 (1.803 m)   Wt (!) 303 lb (137.4 kg)   SpO2 100%   BMI 42.26 kg/m   Wt Readings from Last 3 Encounters:  05/07/24 (!) 303 lb (137.4 kg)  04/26/24 (!) 313 lb 6.4 oz (142.2 kg)  03/26/24 (!) 310 lb (140.6  kg)    Physical Exam   General appearance - alert, well appearing, and in no distress Mental status - normal mood, behavior, speech, dress, motor activity, and thought processes Neck - supple, no significant adenopathy Chest - clear to auscultation, no wheezes, rales or rhonchi, symmetric air entry Heart - normal rate, regular rhythm, normal S1, S2, no murmurs, rubs, clicks or gallops Musculoskeletal - RT hand: RT elbow Extremities - no LE edema     Latest Ref Rng & Units 04/26/2024    2:46 PM 03/27/2024    3:30 AM 03/26/2024    2:58 PM  CMP  Glucose 70 - 99 mg/dL  77    BUN 6 - 20 mg/dL  19    Creatinine 9.38 - 1.24 mg/dL  8.96  8.99   Sodium 864 - 145 mmol/L  139    Potassium 3.5 - 5.1 mmol/L  3.7    Chloride 98 - 111 mmol/L  106    CO2 22 - 32 mmol/L  23    Calcium  8.9 - 10.3 mg/dL  9.0    Total Protein 6.0 - 8.5 g/dL 6.4     Total Bilirubin 0.0 - 1.2 mg/dL 1.0     Alkaline Phos 47 - 123 IU/L 87     AST 0 - 40 IU/L 19     ALT 0 - 44 IU/L 14      Lipid Panel     Component Value Date/Time   CHOL 142 04/26/2024 1446   TRIG 95 04/26/2024 1446   HDL 42 04/26/2024 1446   CHOLHDL 3.4 04/26/2024 1446   CHOLHDL 3.8 01/16/2016 1619   VLDL 23 01/16/2016 1619   LDLCALC 82 04/26/2024 1446    CBC    Component Value  Date/Time   WBC 5.4 03/27/2024 0330   RBC 4.59 03/27/2024 0330   HGB 14.0 03/27/2024 0330   HGB 14.2 03/18/2024 1308   HCT 40.8 03/27/2024 0330   HCT 43.2 03/18/2024 1308   PLT 193 03/27/2024 0330   PLT 210 03/18/2024 1308   MCV 88.9 03/27/2024 0330   MCV 94 03/18/2024 1308   MCH 30.5 03/27/2024 0330   MCHC 34.3 03/27/2024 0330   RDW 12.7 03/27/2024 0330   RDW 12.8 03/18/2024 1308   LYMPHSABS 1.8 03/18/2024 1308   MONOABS 420 01/16/2016 1619   EOSABS 0.0 03/18/2024 1308   BASOSABS 0.0 03/18/2024 1308    ASSESSMENT AND PLAN:  Assessment and Plan Assessment & Plan      There are no diagnoses linked to this encounter.   Patient was given the opportunity to ask questions.  Patient verbalized understanding of the plan and was able to repeat key elements of the plan.   This documentation was completed using Paediatric nurse.  Any transcriptional errors are unintentional.  No orders of the defined types were placed in this encounter.    Requested Prescriptions    No prescriptions requested or ordered in this encounter    No follow-ups on file.  Barnie Louder, MD, FACP    [1]  Current Outpatient Medications on File Prior to Visit  Medication Sig Dispense Refill   amLODipine  (NORVASC ) 10 MG tablet TAKE 1 TABLET BY MOUTH ONCE DAILY 30 tablet 3   aspirin  EC 81 MG tablet Take 1 tablet (81 mg total) by mouth daily. Swallow whole.     atorvastatin  (LIPITOR) 40 MG tablet Take 1 tablet (40 mg total) by mouth daily. 90 tablet 3   Blood Glucose Monitoring  Suppl (ONETOUCH VERIO) w/Device KIT Use as instructed to check blood sugar 3 times daily. E11.319 Z79.4 1 kit 0   Blood Pressure Monitoring (BLOOD PRESSURE KIT) DEVI Use to measure blood pressure 1 each 0   brimonidine  (ALPHAGAN ) 0.2 % ophthalmic solution 1 drop 2 (two) times daily.     carvedilol  (COREG ) 25 MG tablet TAKE 1 TABLET BY MOUTH 2 TIMES DAILY WITH A meal 60 tablet 3   cetirizine  (ZYRTEC   ALLERGY) 10 MG tablet Take 1 tablet (10 mg total) by mouth daily. 30 tablet 11   chlorthalidone  (HYGROTON ) 25 MG tablet TAKE 1 TABLET BY MOUTH DAILY. 90 tablet 2   clobetasol  ointment (TEMOVATE ) 0.05 % Apply 1 Application topically 3 (three) times daily as needed (rash.). 60 g 1   clopidogrel  (PLAVIX ) 75 MG tablet Take 1 tablet (75 mg total) by mouth daily. 90 tablet 3   colchicine  0.6 MG tablet Take 0.6 mg by mouth daily.     dapagliflozin  propanediol (FARXIGA ) 10 MG TABS tablet Take 1 tablet (10 mg total) by mouth daily. 90 tablet 1   Dulaglutide  (TRULICITY ) 1.5 MG/0.5ML SOAJ inject 1.5mg  into THE SKIN ONCE WEEKLY 2 mL 4   fluticasone  (FLONASE ) 50 MCG/ACT nasal spray Place 2 sprays into both nostrils daily. 16 g 6   GLUCOSAMINE HCL PO Take 2,000 mg by mouth 2 (two) times daily. With Vitamin D     glucose blood (ONETOUCH VERIO) test strip USE AS INSTRUCTED TO CHECK BLOOD SUGAR 3 TIMES DAILY 100 strip 11   hydrALAZINE  (APRESOLINE ) 100 MG tablet TAKE 1 TABLET BY MOUTH 3 TIMES DAILY 90 tablet 3   insulin  glargine (LANTUS ) 100 UNIT/ML injection Inject 0.4 mLs (40 Units total) into the skin 2 (two) times daily. 80 mL 6   Insulin  Syringe-Needle U-100 (BD INSULIN  SYRINGE U/F) 31G X 5/16 1 ML MISC Inject 1 each into the skin 2 (two) times daily. 100 each 3   latanoprost  (XALATAN ) 0.005 % ophthalmic solution Place 1 drop into both eyes at bedtime.     metFORMIN  (GLUCOPHAGE ) 1000 MG tablet Take 1 tablet (1,000 mg total) by mouth 2 (two) times daily with a meal. 180 tablet 3   nitroGLYCERIN  (NITROSTAT ) 0.4 MG SL tablet Place 1 tablet (0.4 mg total) under the tongue every 5 (five) minutes as needed for chest pain. 25 tablet 10   OneTouch Delica Lancets 33G MISC Use as instructed to check blood sugar 3 times daily. E11.319 Z79.4 100 each 11   pantoprazole  (PROTONIX ) 40 MG tablet Take 1 tablet (40 mg total) by mouth daily. 30 tablet 2   spironolactone  (ALDACTONE ) 50 MG tablet Take 1 tablet (50 mg total) by  mouth daily. 90 tablet 2   STIMULANT LAXATIVE 8.6-50 MG tablet TAKE 1 TABLET BY MOUTH DAILY (Patient taking differently: Take 1 tablet by mouth daily as needed for mild constipation.) 30 tablet 1   No current facility-administered medications on file prior to visit.  [2]  Allergies Allergen Reactions   Losartan Potassium-Hctz Hives   Cozaar [Losartan Potassium] Hives    Hives    "

## 2024-05-08 ENCOUNTER — Ambulatory Visit: Payer: Self-pay | Admitting: Internal Medicine

## 2024-05-08 ENCOUNTER — Encounter: Payer: Self-pay | Admitting: Internal Medicine

## 2024-05-08 LAB — MICROALBUMIN / CREATININE URINE RATIO
Creatinine, Urine: 87.2 mg/dL
Microalb/Creat Ratio: 76 mg/g{creat} — ABNORMAL HIGH (ref 0–29)
Microalbumin, Urine: 66.4 ug/mL

## 2024-05-08 LAB — URIC ACID: Uric Acid: 7.8 mg/dL (ref 3.8–8.4)

## 2024-05-09 ENCOUNTER — Other Ambulatory Visit: Payer: Self-pay | Admitting: Internal Medicine

## 2024-05-09 MED ORDER — ALLOPURINOL 100 MG PO TABS: 100.0000 mg | ORAL_TABLET | Freq: Every day | ORAL | 6 refills | Status: AC

## 2024-05-17 ENCOUNTER — Encounter: Payer: Self-pay | Admitting: Internal Medicine

## 2024-05-18 ENCOUNTER — Other Ambulatory Visit: Payer: Self-pay | Admitting: Internal Medicine

## 2024-05-18 MED ORDER — PREDNISONE 10 MG PO TABS: ORAL_TABLET | ORAL | 0 refills | Status: AC

## 2024-06-10 ENCOUNTER — Ambulatory Visit

## 2024-07-19 ENCOUNTER — Ambulatory Visit: Admitting: Podiatry

## 2024-08-18 ENCOUNTER — Ambulatory Visit: Admitting: Cardiology

## 2024-09-07 ENCOUNTER — Ambulatory Visit: Payer: Self-pay | Admitting: Internal Medicine
